# Patient Record
Sex: Female | Born: 1990 | Race: White | Hispanic: No | Marital: Single | Smoking: Never smoker
Health system: Southern US, Community
[De-identification: ages and names within clinical notes are randomized; demographics above are authoritative.]

## PROBLEM LIST (undated history)

## (undated) ENCOUNTER — Inpatient Hospital Stay: Payer: Self-pay

## (undated) DIAGNOSIS — Q768 Other congenital malformations of bony thorax: Secondary | ICD-10-CM

## (undated) DIAGNOSIS — D649 Anemia, unspecified: Secondary | ICD-10-CM

## (undated) HISTORY — PX: CERVICAL SPINE SURGERY: SHX589

## (undated) HISTORY — PX: BACK SURGERY: SHX140

---
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
## 2010-11-11 ENCOUNTER — Emergency Department: Payer: Self-pay | Admitting: *Deleted

## 2012-09-18 ENCOUNTER — Ambulatory Visit: Payer: Self-pay | Admitting: Physical Medicine and Rehabilitation

## 2012-12-19 LAB — COMPREHENSIVE METABOLIC PANEL
Albumin: 4.2 g/dL (ref 3.4–5.0)
Alkaline Phosphatase: 88 U/L (ref 50–136)
Anion Gap: 4 — ABNORMAL LOW (ref 7–16)
BUN: 15 mg/dL (ref 7–18)
Bilirubin,Total: 0.5 mg/dL (ref 0.2–1.0)
Calcium, Total: 9.3 mg/dL (ref 8.5–10.1)
Chloride: 106 mmol/L (ref 98–107)
Co2: 26 mmol/L (ref 21–32)
Creatinine: 0.74 mg/dL (ref 0.60–1.30)
EGFR (African American): >60
EGFR (Non-African Amer.): >60
Glucose: 86 mg/dL (ref 65–99)
Osmolality: 272 (ref 275–301)
Potassium: 4.5 mmol/L (ref 3.5–5.1)
SGOT(AST): 24 U/L (ref 15–37)
SGPT (ALT): 21 U/L (ref 12–78)
Sodium: 136 mmol/L (ref 136–145)
Total Protein: 8.6 g/dL — ABNORMAL HIGH (ref 6.4–8.2)

## 2012-12-19 LAB — CBC
HCT: 41 % (ref 35.0–47.0)
HGB: 14 g/dL (ref 12.0–16.0)
MCH: 30.4 pg (ref 26.0–34.0)
MCHC: 34.1 g/dL (ref 32.0–36.0)
MCV: 89 fL (ref 80–100)
Platelet: 277 10*3/uL (ref 150–440)
RBC: 4.6 10*6/uL (ref 3.80–5.20)
RDW: 13.3 % (ref 11.5–14.5)
WBC: 20.4 10*3/uL — ABNORMAL HIGH (ref 3.6–11.0)

## 2012-12-19 LAB — URINALYSIS, COMPLETE
Bacteria: NONE SEEN
Bilirubin,UR: NEGATIVE
Glucose,UR: NEGATIVE mg/dL (ref 0–75)
Nitrite: NEGATIVE
Ph: 5 (ref 4.5–8.0)
Protein: NEGATIVE
RBC,UR: 5 /HPF (ref 0–5)
Specific Gravity: 1.019 (ref 1.003–1.030)
Squamous Epithelial: 3
WBC UR: 13 /HPF (ref 0–5)

## 2012-12-19 LAB — PREGNANCY, URINE: Pregnancy Test, Urine: NEGATIVE m[IU]/mL

## 2012-12-19 LAB — LIPASE, BLOOD: Lipase: 92 U/L (ref 73–393)

## 2012-12-20 ENCOUNTER — Observation Stay: Payer: Self-pay | Admitting: Surgery

## 2012-12-20 LAB — GC/CHLAMYDIA PROBE AMP

## 2012-12-20 LAB — CBC WITH DIFFERENTIAL/PLATELET
Basophil #: 0.1 10*3/uL (ref 0.0–0.1)
Basophil %: 0.5 %
Eosinophil #: 0 10*3/uL (ref 0.0–0.7)
Eosinophil %: 0.3 %
HCT: 36.8 % (ref 35.0–47.0)
HGB: 12.5 g/dL (ref 12.0–16.0)
Lymphocyte #: 3.1 10*3/uL (ref 1.0–3.6)
Lymphocyte %: 26.2 %
MCH: 30.4 pg (ref 26.0–34.0)
MCHC: 34.1 g/dL (ref 32.0–36.0)
MCV: 89 fL (ref 80–100)
Monocyte #: 1 x10 3/mm — ABNORMAL HIGH (ref 0.2–0.9)
Monocyte %: 8.4 %
Neutrophil #: 7.6 10*3/uL — ABNORMAL HIGH (ref 1.4–6.5)
Neutrophil %: 64.6 %
Platelet: 246 10*3/uL (ref 150–440)
RBC: 4.12 10*6/uL (ref 3.80–5.20)
RDW: 13.3 % (ref 11.5–14.5)
WBC: 11.7 10*3/uL — ABNORMAL HIGH (ref 3.6–11.0)

## 2012-12-20 LAB — WET PREP, GENITAL

## 2012-12-21 LAB — URINE CULTURE

## 2012-12-23 ENCOUNTER — Ambulatory Visit: Payer: Self-pay | Admitting: Internal Medicine

## 2013-01-02 ENCOUNTER — Ambulatory Visit: Payer: Self-pay | Admitting: Gastroenterology

## 2013-01-05 LAB — PATHOLOGY REPORT

## 2013-12-29 ENCOUNTER — Ambulatory Visit: Payer: Self-pay | Admitting: Orthopedic Surgery

## 2014-02-04 DIAGNOSIS — M5000 Cervical disc disorder with myelopathy, unspecified cervical region: Secondary | ICD-10-CM | POA: Insufficient documentation

## 2014-07-23 HISTORY — PX: CERVICAL SPINE SURGERY: SHX589

## 2014-08-13 NOTE — H&P (Signed)
PATIENT NAME:  Brianna Arnold, Damaris M MR#:  [id number] DATE OF BIRTH:  August 09, 1990  DATE OF ADMISSION:  12/20/2012  CHIEF COMPLAINT:  Abdominal pain.   BRIEF HISTORY OF PRESENT ILLNESS:  Brianna Arnold is a 24 year old woman seen in the Emergency Room with approximately a 12 hour history of significant abdominal pain and cramping.  Pain is primarily right lower quadrant.  It came on over a space of couple of hours.  This was accompanied by profound nausea and vomiting.  She estimates she has vomited more than 20 times.  She had a chill, but no documented fever.  She has not had any previous episodes.  Work-up in the Emergency Room revealed normal electrolytes with an elevated white blood cell count of 20,000.  CT scan was performed which did not demonstrate any significant abnormalities.  There was no evidence of acute appendicitis.  There did not appear to be any free fluid in the abdomen or any pelvic changes noted.  She had a normal pelvic exam performed by the Emergency Room physician.   She denies a history of hepatitis, yellow jaundice, pancreatitis, peptic ulcer disease, gallbladder disease or diverticulitis.  She has some sort of "twisting of her intestines" as a baby and was hospitalized without surgery.  She never had any surgical procedures.  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   MEDICATIONS:  She takes Flexeril intermittently for muscle spasms.    ALLERGIES:  Has no medical allergies.   SOCIAL HISTORY:  She does not smoke cigarettes, lives with her parents.   REVIEW OF SYSTEMS:  Is performed and otherwise unremarkable.   PHYSICAL EXAMINATION:  VITAL SIGNS:  Temperature is 98.3, blood pressure 120/72, heart rate 76 and regular, pain scale was now a 3 after a 10 on admission.  HEENT:  No scleral icterus.  No pupillary abnormalities.  No facial deformities.   NECK:  Reveals no tenderness.  Midline trachea.  No adenopathy.  CHEST:  Clear with no adventitious sounds.   She has normal pulmonary excursion.  CARDIAC:  No murmurs or gallops.  She seems to be in normal sinus rhythm.  ABDOMEN:  Soft with minimal right lower quadrant tenderness.  She has active bowel sounds.  There is no rebound or guarding.  LOWER EXTREMITIES:  Reveals good distal pulses.  No deformities.  PSYCHIATRIC:  Normal orientation, normal affect.   ASSESSMENT AND PLAN:  I have independently reviewed her CT scan.  I believe I can see her appendix.  I do not see any evidence of any inflammatory change.  However, with her right lower quadrant pain, elevated white blood cell count, history of significant discomfort we will admit her to the hospital for observation.  We will rehydrate her and put her on pain medication, recheck her white blood cell count.  Her parents are present.  The plans were discussed with the patient and her parents and they are in agreement.     ____________________________ Carmie Endalph L. Ely III, MD rle:ea D: 12/20/2012 01:04:43 ET T: 12/20/2012 02:22:06 ET JOB#: [id number]  cc: Quentin Orealph L. Ely III, MD, <Dictator> Quentin OreALPH L ELY MD ELECTRONICALLY SIGNED 12/20/2012 19:59

## 2014-08-27 DIAGNOSIS — M4030 Flatback syndrome, site unspecified: Secondary | ICD-10-CM | POA: Insufficient documentation

## 2014-09-07 DIAGNOSIS — G43009 Migraine without aura, not intractable, without status migrainosus: Secondary | ICD-10-CM | POA: Insufficient documentation

## 2014-10-22 HISTORY — PX: BACK SURGERY: SHX140

## 2014-11-09 DIAGNOSIS — Q789 Osteochondrodysplasia, unspecified: Secondary | ICD-10-CM | POA: Insufficient documentation

## 2014-11-09 DIAGNOSIS — Z981 Arthrodesis status: Secondary | ICD-10-CM | POA: Insufficient documentation

## 2014-11-09 DIAGNOSIS — Q763 Congenital scoliosis due to congenital bony malformation: Secondary | ICD-10-CM | POA: Insufficient documentation

## 2015-01-26 ENCOUNTER — Other Ambulatory Visit: Payer: Self-pay | Admitting: Internal Medicine

## 2015-01-26 DIAGNOSIS — E041 Nontoxic single thyroid nodule: Secondary | ICD-10-CM

## 2015-01-28 ENCOUNTER — Other Ambulatory Visit: Payer: Self-pay | Admitting: Internal Medicine

## 2015-01-28 DIAGNOSIS — E041 Nontoxic single thyroid nodule: Secondary | ICD-10-CM

## 2015-02-01 ENCOUNTER — Other Ambulatory Visit: Payer: Self-pay | Admitting: Radiology

## 2015-02-02 ENCOUNTER — Ambulatory Visit
Admission: RE | Admit: 2015-02-02 | Discharge: 2015-02-02 | Disposition: A | Discharge status: Home / Self Care | Payer: 59 | Source: Ambulatory Visit | Attending: Internal Medicine | Admitting: Internal Medicine

## 2015-02-02 DIAGNOSIS — E041 Nontoxic single thyroid nodule: Secondary | ICD-10-CM

## 2015-02-02 DIAGNOSIS — R591 Generalized enlarged lymph nodes: Secondary | ICD-10-CM | POA: Insufficient documentation

## 2015-04-19 ENCOUNTER — Emergency Department: Payer: 59

## 2015-04-19 ENCOUNTER — Encounter: Payer: Self-pay | Admitting: Emergency Medicine

## 2015-04-19 ENCOUNTER — Emergency Department
Admission: EM | Admit: 2015-04-19 | Discharge: 2015-04-19 | Disposition: A | Discharge status: Home / Self Care | Payer: 59 | Attending: Emergency Medicine | Admitting: Emergency Medicine

## 2015-04-19 DIAGNOSIS — T481X1A Poisoning by skeletal muscle relaxants [neuromuscular blocking agents], accidental (unintentional), initial encounter: Secondary | ICD-10-CM | POA: Insufficient documentation

## 2015-04-19 DIAGNOSIS — R451 Restlessness and agitation: Secondary | ICD-10-CM | POA: Diagnosis not present

## 2015-04-19 DIAGNOSIS — R410 Disorientation, unspecified: Secondary | ICD-10-CM | POA: Diagnosis not present

## 2015-04-19 DIAGNOSIS — R000 Tachycardia, unspecified: Secondary | ICD-10-CM | POA: Diagnosis not present

## 2015-04-19 DIAGNOSIS — F329 Major depressive disorder, single episode, unspecified: Secondary | ICD-10-CM | POA: Diagnosis not present

## 2015-04-19 DIAGNOSIS — Y998 Other external cause status: Secondary | ICD-10-CM | POA: Diagnosis not present

## 2015-04-19 DIAGNOSIS — Y9389 Activity, other specified: Secondary | ICD-10-CM | POA: Diagnosis not present

## 2015-04-19 DIAGNOSIS — T50905A Adverse effect of unspecified drugs, medicaments and biological substances, initial encounter: Secondary | ICD-10-CM

## 2015-04-19 DIAGNOSIS — R4182 Altered mental status, unspecified: Secondary | ICD-10-CM | POA: Diagnosis present

## 2015-04-19 DIAGNOSIS — F19921 Other psychoactive substance use, unspecified with intoxication with delirium: Secondary | ICD-10-CM

## 2015-04-19 DIAGNOSIS — Y9289 Other specified places as the place of occurrence of the external cause: Secondary | ICD-10-CM | POA: Diagnosis not present

## 2015-04-19 DIAGNOSIS — Z3202 Encounter for pregnancy test, result negative: Secondary | ICD-10-CM | POA: Diagnosis not present

## 2015-04-19 DIAGNOSIS — T50901A Poisoning by unspecified drugs, medicaments and biological substances, accidental (unintentional), initial encounter: Secondary | ICD-10-CM

## 2015-04-19 DIAGNOSIS — F1310 Sedative, hypnotic or anxiolytic abuse, uncomplicated: Secondary | ICD-10-CM | POA: Insufficient documentation

## 2015-04-19 HISTORY — DX: Other congenital malformations of bony thorax: Q76.8

## 2015-04-19 LAB — URINE DRUG SCREEN, QUALITATIVE (ARMC ONLY)
Amphetamines, Ur Screen: NOT DETECTED
Barbiturates, Ur Screen: NOT DETECTED
Benzodiazepine, Ur Scrn: NOT DETECTED
Cannabinoid 50 Ng, Ur ~~LOC~~: NOT DETECTED
Cocaine Metabolite,Ur ~~LOC~~: NOT DETECTED
MDMA (Ecstasy)Ur Screen: NOT DETECTED
Methadone Scn, Ur: NOT DETECTED
Opiate, Ur Screen: NOT DETECTED
Phencyclidine (PCP) Ur S: NOT DETECTED
Tricyclic, Ur Screen: POSITIVE — AB

## 2015-04-19 LAB — CBC WITH DIFFERENTIAL/PLATELET
Basophils Absolute: 0 10*3/uL (ref 0–0.1)
Basophils Relative: 0 %
Eosinophils Absolute: 0 10*3/uL (ref 0–0.7)
Eosinophils Relative: 0 %
HCT: 36.2 % (ref 35.0–47.0)
Hemoglobin: 12.1 g/dL (ref 12.0–16.0)
Lymphocytes Relative: 9 %
Lymphs Abs: 1.4 10*3/uL (ref 1.0–3.6)
MCH: 27.5 pg (ref 26.0–34.0)
MCHC: 33.4 g/dL (ref 32.0–36.0)
MCV: 82.4 fL (ref 80.0–100.0)
Monocytes Absolute: 0.8 10*3/uL (ref 0.2–0.9)
Monocytes Relative: 5 %
Neutro Abs: 13.5 10*3/uL — ABNORMAL HIGH (ref 1.4–6.5)
Neutrophils Relative %: 86 %
Platelets: 293 10*3/uL (ref 150–440)
RBC: 4.39 MIL/uL (ref 3.80–5.20)
RDW: 15.4 % — ABNORMAL HIGH (ref 11.5–14.5)
WBC: 15.8 10*3/uL — ABNORMAL HIGH (ref 3.6–11.0)

## 2015-04-19 LAB — URINALYSIS COMPLETE WITH MICROSCOPIC (ARMC ONLY)
Bilirubin Urine: NEGATIVE
Glucose, UA: NEGATIVE mg/dL
Leukocytes, UA: NEGATIVE
Nitrite: NEGATIVE
Protein, ur: NEGATIVE mg/dL
Specific Gravity, Urine: 1.018 (ref 1.005–1.030)
pH: 5 (ref 5.0–8.0)

## 2015-04-19 LAB — HCG, QUANTITATIVE, PREGNANCY: hCG, Beta Chain, Quant, S: 2 m[IU]/mL (ref <5)

## 2015-04-19 LAB — CK: Total CK: 81 U/L (ref 38–234)

## 2015-04-19 LAB — COMPREHENSIVE METABOLIC PANEL
ALT: 13 U/L — ABNORMAL LOW (ref 14–54)
AST: 20 U/L (ref 15–41)
Albumin: 4.6 g/dL (ref 3.5–5.0)
Alkaline Phosphatase: 86 U/L (ref 38–126)
Anion gap: 8 (ref 5–15)
BUN: 11 mg/dL (ref 6–20)
CO2: 25 mmol/L (ref 22–32)
Calcium: 9.6 mg/dL (ref 8.9–10.3)
Chloride: 106 mmol/L (ref 101–111)
Creatinine, Ser: 0.55 mg/dL (ref 0.44–1.00)
GFR calc Af Amer: >60 mL/min (ref >60)
GFR calc non Af Amer: >60 mL/min (ref >60)
Glucose, Bld: 109 mg/dL — ABNORMAL HIGH (ref 65–99)
Potassium: 3.7 mmol/L (ref 3.5–5.1)
Sodium: 139 mmol/L (ref 135–145)
Total Bilirubin: 0.7 mg/dL (ref 0.3–1.2)
Total Protein: 8.4 g/dL — ABNORMAL HIGH (ref 6.5–8.1)

## 2015-04-19 LAB — BLOOD GAS, VENOUS
Patient temperature: 37
pCO2, Ven: 38 mmHg — ABNORMAL LOW (ref 44.0–60.0)
pH, Ven: 7.4 (ref 7.320–7.430)

## 2015-04-19 LAB — SALICYLATE LEVEL: Salicylate Lvl: <4 mg/dL (ref 2.8–30.0)

## 2015-04-19 LAB — LACTIC ACID, PLASMA: Lactic Acid, Venous: 1.6 mmol/L (ref 0.5–2.0)

## 2015-04-19 LAB — ETHANOL: Alcohol, Ethyl (B): <5 mg/dL (ref <5)

## 2015-04-19 LAB — ACETAMINOPHEN LEVEL: Acetaminophen (Tylenol), Serum: <10 ug/mL — ABNORMAL LOW (ref 10–30)

## 2015-04-19 MED ORDER — LORAZEPAM 2 MG/ML IJ SOLN
1.0000 mg | Freq: Once | INTRAMUSCULAR | Status: AC
  Administered 2015-04-19: 1 mg via INTRAVENOUS

## 2015-04-19 MED ORDER — SODIUM CHLORIDE 0.9 % IV SOLN
1000.0000 mL | Freq: Once | INTRAVENOUS | Status: AC
  Administered 2015-04-19: 1000 mL via INTRAVENOUS

## 2015-04-19 MED ORDER — LORAZEPAM 2 MG/ML IJ SOLN
INTRAMUSCULAR | Status: AC
  Administered 2015-04-19: 1 mg via INTRAVENOUS
  Filled 2015-04-19: qty 1

## 2015-04-19 NOTE — Consult Note (Signed)
[facility] Consult   Reason for Consult:  Follow-up for a 24 year old woman who was brought to the hospital this morning after an apparent overdose on Flexeril Referring Physician:  Jimmye Norman Patient Identification: Brianna Arnold MRN:  [id number] Principal Diagnosis: Medication-induced delirium, acute, mixed level of activity [facility]) Diagnosis:   Patient Active Problem List   Diagnosis Date Noted  . Medication-induced delirium, acute, mixed level of activity ([place]) [F19.921] 04/19/2015  . Overdose [T50.901A] 04/19/2015    Total Time spent with patient: 1 hour  Subjective:   Brianna Arnold is a 24 y.o. female patient admitted with "I just took some Flexeril because of my muscle spasms".  HPI:  Information from the patient and the chart. Also interviewed her mother and father this morning before the patient had woken up. The mother reports that she walked by the patient's room this morning and saw that the patient appeared to be passed out on her bed. Patient was not fully arousable. There was an open bottle of Flexeril nearby although it was apparently not completely empty and they did not know how many she might of taken. Mother brought the patient into the emergency room. Patient was agitatedly delirious and required some sedation and then has slept most of the day. Mother reports that there had not been any signs of depression. Patient had not been saying anything hopeless or suicidal. Had seemed to be in pretty much her normal mood except that she had recently had some extra worries about finances. Patient finally woke up and was able to give a coherent history. She says that she remembers waking up at 2:00 in the morning and taking too Flexeril which is her usual dosage because of her muscle spasms and then going back to bed. She acknowledges that it is possible that she could've taken more than that but she does not remember it. Patient absolutely denies  there was any suicidal intent. Denies that she was trying to overdose. Denies that she took any other medication along with it.  Social history: Patient lives with her mother. Parents are divorced but father remains part of her life. Patient has not been able to work for most of this year because she has been getting surgery on her neck and back because of her chronic congenital condition. Has some worries about money as a result. Doesn't feel ready yet to go back to work and is uncertain whether she would ever be able to do so.  Substance abuse history: Denies any abuse of alcohol or drugs for past history of abuse of alcohol or drugs.  Medical condition: Patient has a congenital medical syndrome which apparently causes serious problems with the spine. Evidently it had gotten to the point of being potentially life threatening earlier this year and she had several surgeries with a specialist in [place] to repair her neck and back. She has been recovering since then. Outside of this no other ongoing medical problems.  Past Psychiatric History: Patient had been briefly treated with an antidepressant by her primary care doctor at one point because of anxiety but was never compliant with it. Patient does not think she's ever had a major depression. No history of hospitalizations no history of suicide attempts  Risk to Self: Is patient at risk for suicide?: No Risk to Others:   Prior Inpatient Therapy:   Prior Outpatient Therapy:    Past Medical History:  Past Medical History  Diagnosis Date  . Jarcho-Levin syndrome  Past Surgical History  Procedure Laterality Date  . Back surgery    . Cervical spine surgery     Family History: No family history on file. Family Psychiatric  History: No family history of mental health problems identified Social History:  History  Alcohol Use: Not on file     History  Drug Use Not on file    Social History   Social History  . Marital Status: Single     Spouse Name: N/A  . Number of Children: N/A  . Years of Education: N/A   Social History Main Topics  . Smoking status: Not on file  . Smokeless tobacco: Not on file  . Alcohol Use: Not on file  . Drug Use: Not on file  . Sexual Activity: Not on file   Other Topics Concern  . Not on file   Social History Narrative  . No narrative on file   Additional Social History:                          Allergies:   Allergies  Allergen Reactions  . Ativan [Lorazepam] Other (See Comments)    Hallucinations    Labs:  Results for orders placed or performed during the hospital encounter of 04/19/15 (from the past 48 hour(s))  CBC with Differential     Status: Abnormal   Collection Time: 04/19/15  8:44 AM  Result Value Ref Range   WBC 15.8 (H) 3.6 - 11.0 K/uL   RBC 4.39 3.80 - 5.20 MIL/uL   Hemoglobin 12.1 12.0 - 16.0 g/dL   HCT 36.2 35.0 - 47.0 %   MCV 82.4 80.0 - 100.0 fL   MCH 27.5 26.0 - 34.0 pg   MCHC 33.4 32.0 - 36.0 g/dL   RDW 15.4 (H) 11.5 - 14.5 %   Platelets 293 150 - 440 K/uL   Neutrophils Relative % 86 %   Neutro Abs 13.5 (H) 1.4 - 6.5 K/uL   Lymphocytes Relative 9 %   Lymphs Abs 1.4 1.0 - 3.6 K/uL   Monocytes Relative 5 %   Monocytes Absolute 0.8 0.2 - 0.9 K/uL   Eosinophils Relative 0 %   Eosinophils Absolute 0.0 0 - 0.7 K/uL   Basophils Relative 0 %   Basophils Absolute 0.0 0 - 0.1 K/uL  Comprehensive metabolic panel     Status: Abnormal   Collection Time: 04/19/15  8:44 AM  Result Value Ref Range   Sodium 139 135 - 145 mmol/L   Potassium 3.7 3.5 - 5.1 mmol/L   Chloride 106 101 - 111 mmol/L   CO2 25 22 - 32 mmol/L   Glucose, Bld 109 (H) 65 - 99 mg/dL   BUN 11 6 - 20 mg/dL   Creatinine, Ser 0.55 0.44 - 1.00 mg/dL   Calcium 9.6 8.9 - 10.3 mg/dL   Total Protein 8.4 (H) 6.5 - 8.1 g/dL   Albumin 4.6 3.5 - 5.0 g/dL   AST 20 15 - 41 U/L   ALT 13 (L) 14 - 54 U/L   Alkaline Phosphatase 86 38 - 126 U/L   Total Bilirubin 0.7 0.3 - 1.2 mg/dL   GFR  calc non Af Amer >60 >60 mL/min   GFR calc Af Amer >60 >60 mL/min    Comment: (NOTE) The eGFR has been calculated using the CKD EPI equation. This calculation has not been validated in all clinical situations. eGFR's persistently <60 mL/min signify possible Chronic Kidney Disease.  Anion gap 8 5 - 15  Ethanol     Status: None   Collection Time: 04/19/15  8:44 AM  Result Value Ref Range   Alcohol, Ethyl (B) <5 <5 mg/dL    Comment:        LOWEST DETECTABLE LIMIT FOR SERUM ALCOHOL IS 5 mg/dL FOR MEDICAL PURPOSES ONLY   Acetaminophen level     Status: Abnormal   Collection Time: 04/19/15  8:44 AM  Result Value Ref Range   Acetaminophen (Tylenol), Serum <10 (L) 10 - 30 ug/mL    Comment:        THERAPEUTIC CONCENTRATIONS VARY SIGNIFICANTLY. A RANGE OF 10-30 ug/mL MAY BE AN EFFECTIVE CONCENTRATION FOR MANY PATIENTS. HOWEVER, SOME ARE BEST TREATED AT CONCENTRATIONS OUTSIDE THIS RANGE. ACETAMINOPHEN CONCENTRATIONS >150 ug/mL AT 4 HOURS AFTER INGESTION AND >50 ug/mL AT 12 HOURS AFTER INGESTION ARE OFTEN ASSOCIATED WITH TOXIC REACTIONS.   Salicylate level     Status: None   Collection Time: 04/19/15  8:44 AM  Result Value Ref Range   Salicylate Lvl <1.6 2.8 - 30.0 mg/dL  hCG, quantitative, pregnancy     Status: None   Collection Time: 04/19/15  8:44 AM  Result Value Ref Range   hCG, Beta Chain, Quant, S 2 <5 mIU/mL    Comment:          GEST. AGE      CONC.  (mIU/mL)   <=1 WEEK        5 - 50     2 WEEKS       50 - 500     3 WEEKS       100 - 10,000     4 WEEKS     1,000 - 30,000     5 WEEKS     3,500 - 115,000   6-8 WEEKS     12,000 - 270,000    12 WEEKS     15,000 - 220,000        FEMALE AND NON-PREGNANT FEMALE:     LESS THAN 5 mIU/mL   Lactic acid, plasma     Status: None   Collection Time: 04/19/15  8:56 AM  Result Value Ref Range   Lactic Acid, Venous 1.6 0.5 - 2.0 mmol/L  CK     Status: None   Collection Time: 04/19/15  8:56 AM  Result Value Ref Range   Total  CK 81 38 - 234 U/L  Urinalysis complete, with microscopic     Status: Abnormal   Collection Time: 04/19/15  8:59 AM  Result Value Ref Range   Color, Urine YELLOW (A) YELLOW   APPearance CLEAR (A) CLEAR   Glucose, UA NEGATIVE NEGATIVE mg/dL   Bilirubin Urine NEGATIVE NEGATIVE   Ketones, ur TRACE (A) NEGATIVE mg/dL   Specific Gravity, Urine 1.018 1.005 - 1.030   Hgb urine dipstick 2+ (A) NEGATIVE   pH 5.0 5.0 - 8.0   Protein, ur NEGATIVE NEGATIVE mg/dL   Nitrite NEGATIVE NEGATIVE   Leukocytes, UA NEGATIVE NEGATIVE   RBC / HPF 6-30 0 - 5 RBC/hpf   WBC, UA 6-30 0 - 5 WBC/hpf   Bacteria, UA RARE (A) NONE SEEN   Squamous Epithelial / LPF 0-5 (A) NONE SEEN   Mucous PRESENT   Urine Drug Screen, Qualitative ([facility] only)     Status: Abnormal   Collection Time: 04/19/15  8:59 AM  Result Value Ref Range   Tricyclic, Ur Screen POSITIVE (A) NONE DETECTED   Amphetamines, Ur  Screen NONE DETECTED NONE DETECTED   MDMA (Ecstasy)Ur Screen NONE DETECTED NONE DETECTED   Cocaine Metabolite,Ur [place] NONE DETECTED NONE DETECTED   Opiate, Ur Screen NONE DETECTED NONE DETECTED   Phencyclidine (PCP) Ur S NONE DETECTED NONE DETECTED   Cannabinoid 50 Ng, Ur [place] NONE DETECTED NONE DETECTED   Barbiturates, Ur Screen NONE DETECTED NONE DETECTED   Benzodiazepine, Ur Scrn NONE DETECTED NONE DETECTED   Methadone Scn, Ur NONE DETECTED NONE DETECTED    Comment: (NOTE) 153  Tricyclics, urine               Cutoff 1000 ng/mL 200  Amphetamines, urine             Cutoff 1000 ng/mL 300  MDMA (Ecstasy), urine           Cutoff 500 ng/mL 400  Cocaine Metabolite, urine       Cutoff 300 ng/mL 500  Opiate, urine                   Cutoff 300 ng/mL 600  Phencyclidine (PCP), urine      Cutoff 25 ng/mL 700  Cannabinoid, urine              Cutoff 50 ng/mL 800  Barbiturates, urine             Cutoff 200 ng/mL 900  Benzodiazepine, urine           Cutoff 200 ng/mL 1000 Methadone, urine                Cutoff 300 ng/mL 1100 1200 The  urine drug screen provides only a preliminary, unconfirmed 1300 analytical test result and should not be used for non-medical 1400 purposes. Clinical consideration and professional judgment should 1500 be applied to any positive drug screen result due to possible 1600 interfering substances. A more specific alternate chemical method 1700 must be used in order to obtain a confirmed analytical result.  1800 Gas chromato graphy / mass spectrometry (GC/MS) is the preferred 1900 confirmatory method.   Blood gas, venous     Status: Abnormal   Collection Time: 04/19/15  9:05 AM  Result Value Ref Range   pH, Ven 7.40 7.320 - 7.430   pCO2, Ven 38 (L) 44.0 - 60.0 mmHg   Patient temperature 37.0    Collection site VEIN    Sample type VENOUS     No current facility-administered medications for this encounter.   Current Outpatient Prescriptions  Medication Sig Dispense Refill  . cyclobenzaprine (FLEXERIL) 10 MG tablet Take 1 tablet by mouth 3 (three) times daily as needed.      Musculoskeletal: Strength & Muscle Tone: decreased Gait & Station: unable to stand Patient leans: N/A  Psychiatric Specialty Exam: Review of Systems  Constitutional: Negative.   Eyes: Negative.   Respiratory: Negative.   Cardiovascular: Negative.   Gastrointestinal: Negative.   Musculoskeletal: Positive for back pain and neck pain.  Skin: Negative.   Neurological: Negative.   Psychiatric/Behavioral: Negative for depression, suicidal ideas, hallucinations, memory loss and substance abuse. The patient is not nervous/anxious and does not have insomnia.     Blood pressure 109/70, pulse 94, temperature 98.1 F (36.7 C), temperature source Oral, resp. rate 19, height '5\' 2"'  (1.575 m), weight 74.844 kg (165 lb), SpO2 98 %.Body mass index is 30.17 kg/(m^2).  General Appearance: Casual  Eye Contact::  Fair  Speech:  Slow  Volume:  Decreased  Mood:  Euthymic  Affect:  Constricted  Thought Process:  Goal Directed   Orientation:  Full (Time, Place, and Person)  Thought Content:  Negative  Suicidal Thoughts:  No  Homicidal Thoughts:  No  Memory:  Immediate;   Good Recent;   Fair Remote;   Fair  Judgement:  Fair  Insight:  Fair  Psychomotor Activity:  Decreased  Concentration:  Fair  Recall:  [employer] of Knowledge:Fair  Language: Fair  Akathisia:  No  Handed:  Right  AIMS (if indicated):     Assets:  Communication Skills Desire for Improvement Housing Intimacy Resilience Social Support  ADL's:  Intact  Cognition: WNL  Sleep:      Treatment Plan Summary: Plan This is a 24 year old woman brought into the hospital with a medication-induced delirium which has now resolved. Medication overdose appears to of largely cleared. There was concern about possible intentional overdose. Everything that I've heard from the patient and her family suggest that there was no reason to think there was a depression and no evidence of intentional overdose. Patient absolutely denies any suicidal ideation. Family reports that the patient has been talking about positive things to live for and things she is looking forward to in the future. At this point we don't have any evidence to up hold a involuntary commitment. Patient was educated about the risks of potential overdose of sedating medicine to take only the risk of accidental overdose. We reviewed some method she may want to employ to prevent the risk of extra doses in the future. Strongly encouraged to not take more than the 2 doses of Flexeril per day if possible. She is to follow-up with her regular primary care doctor. No indication for follow-up with psychiatric care. Problems appear to be resolved and she can be discharged. IVC discontinued. Patient and family agreeable to plan.  Disposition: No evidence of imminent risk to self or others at present.   Patient does not meet criteria for psychiatric inpatient admission.  Gursimran Litaker 04/19/2015 6:59 PM

## 2015-04-19 NOTE — ED Provider Notes (Signed)
-----------------------------------------   6:25 PM on 04/19/2015 -----------------------------------------   BP 109/70 mmHg  Pulse 94  Temp(Src) 98.1 F (36.7 C) (Oral)  Resp 19  Ht 5\' 2"  (1.575 m)  Wt 74.844 kg  BMI 30.17 kg/m2  SpO2 98%  I spoke in person with Dr. Toni Amendlapacs who evaluated the patient and feels that the patient is safe to be discharged with outpatient follow-up.  After speaking with the patient and her family, he believes that this was an accidental overdose and had an extensive discussion with the patient and her family about how to avoid similar issues in the chair.  The patient is hemodynamically stable and appropriate to go at this time, ambulatory without difficulty and in the company of her mother.   Loleta Roseory Naamah Boggess, MD 04/19/15 [phone]

## 2015-04-19 NOTE — Discharge Instructions (Signed)
You were seen today for what appears to be an accidental overdose of your Flexeril.  Please be very careful and cautious when you take your medication so that you do not accidentally take more than the prescribed dose.  Follow-up with your regular doctor at the next available opportunity.   Accidental Overdose A drug overdose occurs when a chemical substance (drug or medication) is used in amounts large enough to overcome a person. This may result in severe illness or death. This is a type of poisoning. Accidental overdoses of medications or other substances come from a variety of reasons. When this happens accidentally, it is often because the person taking the substance does not know enough about what they have taken. Drugs which commonly cause overdose deaths are alcohol, psychotropic medications (medications which affect the mind), pain medications, illegal drugs (street drugs) such as cocaine and heroin, and multiple drugs taken at the same time. It may result from careless behavior (such as over-indulging at a party). Other causes of overdose may include multiple drug use, a lapse in memory, or drug use after a period of no drug use.  Sometimes overdosing occurs because a person cannot remember if they have taken their medication.  A common unintentional overdose in young children involves multi-vitamins containing iron. Iron is a part of the hemoglobin molecule in blood. It is used to transport oxygen to living cells. When taken in small amounts, iron allows the body to restock hemoglobin. In large amounts, it causes problems in the body. If this overdose is not treated, it can lead to death. Never take medicines that show signs of tampering or do not seem quite right. Never take medicines in the dark or in poor lighting. Read the label and check each dose of medicine before you take it. When adults are poisoned, it happens most often through carelessness or lack of information. Taking medicines in the  dark or taking medicine prescribed for someone else to treat the same type of problem is a dangerous practice. SYMPTOMS  Symptoms of overdose depend on the medication and amount taken. They can vary from over-activity with stimulant over-dosage, to sleepiness from depressants such as alcohol, narcotics and tranquilizers. Confusion, dizziness, nausea and vomiting may be present. If problems are severe enough coma and death may result. DIAGNOSIS  Diagnosis and management are generally straightforward if the drug is known. Otherwise it is more difficult. At times, certain symptoms and signs exhibited by the patient, or blood tests, can reveal the drug in question.  TREATMENT  In an emergency department, most patients can be treated with supportive measures. Antidotes may be available if there has been an overdose of opioids or benzodiazepines. A rapid improvement will often occur if this is the cause of overdose. At home or away from medical care:  There may be no immediate problems or warning signs in children.  Not everything works well in all cases of poisoning.  Take immediate action. Poisons may act quickly.  If you think someone has swallowed medicine or a household product, and the person is unconscious, having seizures (convulsions), or is not breathing, immediately call for an ambulance. IF a person is conscious and appears to be doing OK but has swallowed a poison:  Do not wait to see what effect the poison will have. Immediately call a poison control center (listed in the white pages of your telephone book under "Poison Control" or inside the front cover with other emergency numbers). Some poison control centers have TTY  capability for the deaf. Check with your local center if you or someone in your family requires this service.  Keep the container so you can read the label on the product for ingredients.  Describe what, when, and how much was taken and the age and condition of the  person poisoned. Inform them if the person is vomiting, choking, drowsy, shows a change in color or temperature of skin, is conscious or unconscious, or is convulsing.  Do not cause vomiting unless instructed by medical personnel. Do not induce vomiting or force liquids into a person who is convulsing, unconscious, or very drowsy. Stay calm and in control.   Activated charcoal also is sometimes used in certain types of poisoning and you may wish to add a supply to your emergency medicines. It is available without a prescription. Call a poison control center before using this medication. PREVENTION  Thousands of children die every year from unintentional poisoning. This may be from household chemicals, poisoning from carbon monoxide in a car, taking their parent's medications, or simply taking a few iron pills or vitamins with iron. Poisoning comes from unexpected sources.  Store medicines out of the sight and reach of children, preferably in a locked cabinet. Do not keep medications in a food cabinet. Always store your medicines in a secure place. Get rid of expired medications.  If you have children living with you or have them as occasional guests, you should have child-resistant caps on your medicine containers. Keep everything out of reach. Child proof your home.  If you are called to the telephone or to answer the door while you are taking a medicine, take the container with you or put the medicine out of the reach of small children.  Do not take your medication in front of children. Do not tell your child how good a medication is and how good it is for them. They may get the idea it is more of a treat.  If you are an adult and have accidentally taken an overdose, you need to consider how this happened and what can be done to prevent it from happening again. If this was from a street drug or alcohol, determine if there is a problem that needs addressing. If you are not sure a problems exists,  it is easy to talk to a professional and ask them if they think you have a problem. It is better to handle this problem in this way before it happens again and has a much worse consequence.   This information is not intended to replace advice given to you by your health care provider. Make sure you discuss any questions you have with your health care provider.   Document Released: 06/23/2004 Document Revised: 04/30/2014 Document Reviewed: 09/27/2014 Elsevier Interactive Patient Education [employer]

## 2015-04-19 NOTE — ED Notes (Signed)
Patient ambulatory with assistance to bedside commode.  Patient alert and oriented to self and place but not to time.

## 2015-04-19 NOTE — ED Notes (Signed)
Flexeril bottle from pharmacy returned to mother of pt.

## 2015-04-19 NOTE — ED Notes (Signed)
Per pt mother, she found the pt in bed, states she had water in the tub, not acting herself, found bottle of flexeril in bed,  Pt has slurred speech, figiting , rocking back in forth, speech not making any sense.Marland Kitchen. Pt is agitated.. Mother brought the bottle of flexeril with her,..Marland Kitchen

## 2015-04-19 NOTE — ED Notes (Signed)
Iv d'ced from left a/c.  Pt alert.  Refusing wheelchair.  Family assisted pt walking out of er.

## 2015-04-19 NOTE — ED Notes (Signed)
Patient now resting quietly.  Respirations even and nonlabored.  Parents are at bedside at this time.

## 2015-04-19 NOTE — ED Notes (Signed)
Dr clapacs and TTs in with pt and family. ivc rescinded.  D/c inst to mother.

## 2015-04-19 NOTE — ED Notes (Signed)
Resumed care from ArvinMeritoranna rn.  Pt sleeping but arousable.  Family with pt.  Sinus on monitor.  Skin warm and dry.  Iv in place.

## 2015-04-19 NOTE — ED Provider Notes (Addendum)
[facility] Emergency Department Provider Note     Time seen: ----------------------------------------- 8:46 AM on 04/19/2015 -----------------------------------------  L5 caveat: Review of systems and history is limited by altered mental status.  I have reviewed the triage vital signs and the nursing notes.   HISTORY  Chief Complaint Altered Mental Status    HPI Brianna Arnold is a 24 y.o. female who presents to ER for altered mental status. Mom found her in bed without any clothes on not acting normally. She had placed water in the bathtub but was not in the bathtub, a bottle of Flexeril, Ativan. She has slurred speech is brought in fighting and agitated. Patient has never had any episode like this before. Mom states she thought she was depressed over the last week.   No past medical history on file.  There are no active problems to display for this patient.   No past surgical history on file.  Allergies Ativan  Social History Social History  Substance Use Topics  . Smoking status: Not on file  . Smokeless tobacco: Not on file  . Alcohol Use: Not on file    Review of Systems Unknown patient with foot pain, agitated with altered mental status. ____________________________________________   PHYSICAL EXAM:  VITAL SIGNS: ED Triage Vitals  Enc Vitals Group     BP 04/19/15 0828 153/80 mmHg     Pulse Rate 04/19/15 0828 120     Resp --      Temp 04/19/15 0828 98.1 F (36.7 C)     Temp Source 04/19/15 0828 Oral     SpO2 04/19/15 0828 99 %     Weight 04/19/15 0828 165 lb (74.844 kg)     Height 04/19/15 0828 5\' 2"  (1.575 m)     Head Cir --      Peak Flow --      Pain Score --      Pain Loc --      Pain Edu? --      Excl. in [place]? --     Constitutional: Patient is agitated, is not alert and oriented. Moderate distress. Eyes: Conjunctivae are normal. PERRL. Normal extraocular movements. ENT   Head: Normocephalic and  atraumatic.   Nose: No congestion/rhinnorhea.   Mouth/Throat: Mucous membranes are dry   Neck: No stridor. Cardiovascular: Rapid rate, regular rhythm. Normal and symmetric distal pulses are present in all extremities. No murmurs, rubs, or gallops. Respiratory: Normal respiratory effort without tachypnea nor retractions. Breath sounds are clear and equal bilaterally. No wheezes/rales/rhonchi. Gastrointestinal: Soft and nontender. No distention. No abdominal bruits.  Musculoskeletal: Nontender with normal range of motion in all extremities. No joint effusions.  No lower extremity tenderness nor edema. Good peripheral pulses. Neurologic:  Patient is agitated and confused, appears to be moving her extremities well. Skin:  Skin is warm, dry and intact. No rash noted. Psychiatric: Unable to assess, patient agitated at this time. ____________________________________________  EKG: Interpreted by me. Sinus tachycardia with a rate of 151 bpm, normal PR interval, normal QS with, normal QT interval. Normal axis.  ____________________________________________  ED COURSE:  Pertinent labs & imaging results that were available during my care of the patient were reviewed by me and considered in my medical decision making (see chart for details). Patient is agitated and confused, likely overdose. ____________________________________________    LABS (pertinent positives/negatives)  Labs Reviewed  CBC WITH DIFFERENTIAL/PLATELET - Abnormal; Notable for the following:    WBC 15.8 (*)    RDW 15.4 (*)  Neutro Abs 13.5 (*)    All other components within normal limits  COMPREHENSIVE METABOLIC PANEL - Abnormal; Notable for the following:    Glucose, Bld 109 (*)    Total Protein 8.4 (*)    ALT 13 (*)    All other components within normal limits  ACETAMINOPHEN LEVEL - Abnormal; Notable for the following:    Acetaminophen (Tylenol), Serum <10 (*)    All other components within normal limits   BLOOD GAS, VENOUS - Abnormal; Notable for the following:    pCO2, Ven 38 (*)    All other components within normal limits  LACTIC ACID, PLASMA  ETHANOL  SALICYLATE LEVEL  HCG, QUANTITATIVE, PREGNANCY  CK  URINALYSIS COMPLETEWITH MICROSCOPIC ([facility] ONLY)  LACTIC ACID, PLASMA  URINE DRUG SCREEN, QUALITATIVE ([facility] ONLY)  CBG MONITORING, ED    RADIOLOGY Images were viewed by me  Chest x-ray  IMPRESSION: Very low inspiratory volumes with perhaps mild bibasilar atelectasis. Otherwise, no acute cardiopulmonary process. ____________________________________________  FINAL ASSESSMENT AND PLAN  Altered mental status, suspected overdose  Plan: Patient with labs and imaging as dictated above. Patient likely with overdose of Flexeril. Patient showing anticholinergic toxicity. Patient has received fluids and benzodiazepine for agitation and likely dehydration. I discussed with poison control, she likely took a minimum of 10 Flexeril tablets to induce this type of toxicity. Suspected overdose, she will be involuntarily committed at a later point.   Emily Filbert, MD   Emily Filbert, MD 04/19/15 [phone]  Emily Filbert, MD 04/19/15 1007 Patient is still lethargic but awakes and talks to me normally. Her labs are unremarkable. She is pending psychiatric evaluation. Emily Filbert, MD 04/19/15 1030  Emily Filbert, MD 04/19/15 [phone]

## 2016-06-25 ENCOUNTER — Other Ambulatory Visit: Payer: Self-pay | Admitting: Thoracic Surgery

## 2016-06-25 ENCOUNTER — Ambulatory Visit
Admission: RE | Admit: 2016-06-25 | Discharge: 2016-06-25 | Disposition: A | Discharge status: Home / Self Care | Payer: Disability Insurance | Source: Ambulatory Visit | Attending: Thoracic Surgery | Admitting: Thoracic Surgery

## 2016-06-25 DIAGNOSIS — Q798 Other congenital malformations of musculoskeletal system: Secondary | ICD-10-CM

## 2016-06-25 DIAGNOSIS — Q766 Other congenital malformations of ribs: Secondary | ICD-10-CM | POA: Insufficient documentation

## 2016-06-25 DIAGNOSIS — Q7649 Other congenital malformations of spine, not associated with scoliosis: Secondary | ICD-10-CM | POA: Insufficient documentation

## 2018-09-23 ENCOUNTER — Other Ambulatory Visit: Payer: Self-pay | Admitting: Neurosurgery

## 2018-09-23 DIAGNOSIS — Q798 Other congenital malformations of musculoskeletal system: Secondary | ICD-10-CM

## 2018-09-23 DIAGNOSIS — M542 Cervicalgia: Secondary | ICD-10-CM

## 2018-09-24 ENCOUNTER — Other Ambulatory Visit: Payer: Self-pay | Admitting: Neurosurgery

## 2018-09-26 ENCOUNTER — Ambulatory Visit
Admission: RE | Admit: 2018-09-26 | Discharge: 2018-09-26 | Disposition: A | Discharge status: Home / Self Care | Payer: Medicare HMO | Source: Ambulatory Visit | Attending: Neurosurgery | Admitting: Neurosurgery

## 2018-09-26 ENCOUNTER — Ambulatory Visit
Admission: RE | Admit: 2018-09-26 | Discharge: 2018-09-26 | Disposition: A | Discharge status: Home / Self Care | Payer: Medicare HMO | Attending: Neurosurgery | Admitting: Neurosurgery

## 2018-09-26 DIAGNOSIS — Q798 Other congenital malformations of musculoskeletal system: Secondary | ICD-10-CM | POA: Insufficient documentation

## 2018-09-26 DIAGNOSIS — M542 Cervicalgia: Secondary | ICD-10-CM

## 2018-10-10 ENCOUNTER — Ambulatory Visit: Payer: Disability Insurance

## 2018-11-24 ENCOUNTER — Other Ambulatory Visit: Payer: Self-pay | Admitting: Neurosurgery

## 2018-11-24 DIAGNOSIS — M545 Low back pain, unspecified: Secondary | ICD-10-CM

## 2018-11-24 DIAGNOSIS — G8929 Other chronic pain: Secondary | ICD-10-CM

## 2018-11-24 DIAGNOSIS — Q798 Other congenital malformations of musculoskeletal system: Secondary | ICD-10-CM

## 2018-12-01 ENCOUNTER — Other Ambulatory Visit: Payer: Disability Insurance

## 2018-12-01 ENCOUNTER — Ambulatory Visit
Admission: RE | Admit: 2018-12-01 | Discharge: 2018-12-01 | Disposition: A | Discharge status: Home / Self Care | Payer: Medicare HMO | Source: Ambulatory Visit | Attending: Neurosurgery | Admitting: Neurosurgery

## 2018-12-01 ENCOUNTER — Other Ambulatory Visit: Payer: Self-pay

## 2018-12-01 DIAGNOSIS — M545 Low back pain, unspecified: Secondary | ICD-10-CM

## 2018-12-01 DIAGNOSIS — G8929 Other chronic pain: Secondary | ICD-10-CM | POA: Insufficient documentation

## 2018-12-01 DIAGNOSIS — Q798 Other congenital malformations of musculoskeletal system: Secondary | ICD-10-CM | POA: Diagnosis present

## 2018-12-05 ENCOUNTER — Other Ambulatory Visit: Payer: Self-pay

## 2018-12-05 ENCOUNTER — Emergency Department
Admission: EM | Admit: 2018-12-05 | Discharge: 2018-12-05 | Disposition: A | Discharge status: Home / Self Care | Payer: Medicare HMO | Attending: Student | Admitting: Student

## 2018-12-05 ENCOUNTER — Emergency Department: Payer: Medicare HMO

## 2018-12-05 ENCOUNTER — Encounter: Payer: Self-pay | Admitting: Emergency Medicine

## 2018-12-05 DIAGNOSIS — R51 Headache: Secondary | ICD-10-CM | POA: Diagnosis not present

## 2018-12-05 DIAGNOSIS — M542 Cervicalgia: Secondary | ICD-10-CM | POA: Diagnosis present

## 2018-12-05 MED ORDER — CYCLOBENZAPRINE HCL 10 MG PO TABS
10.0000 mg | ORAL_TABLET | Freq: Once | ORAL | Status: AC
  Administered 2018-12-05: 22:00:00 10 mg via ORAL
  Filled 2018-12-05: qty 1

## 2018-12-05 MED ORDER — ACETAMINOPHEN 500 MG PO TABS
1000.0000 mg | ORAL_TABLET | Freq: Once | ORAL | Status: AC
  Administered 2018-12-05: 22:00:00 1000 mg via ORAL
  Filled 2018-12-05: qty 2

## 2018-12-05 MED ORDER — KETOROLAC TROMETHAMINE 30 MG/ML IJ SOLN
30.0000 mg | Freq: Once | INTRAMUSCULAR | Status: AC
  Administered 2018-12-05: 30 mg via INTRAMUSCULAR
  Filled 2018-12-05: qty 1

## 2018-12-05 NOTE — ED Notes (Signed)
Patient reports relaxing earlier today and head tipped back.  States she was told after her surgery this should not happen.  Denies any type of injury.  Patient reports about 10 minutes prior to getting in exam room noticed a burning/stinging sensation across old incision site.

## 2018-12-05 NOTE — Discharge Instructions (Addendum)
Thank you for letting us take care of you in the emergency department today.   Please continue to take your regular, prescribed medications.   Please follow up with: - Dr. Izora Ribas to review your ER visit and follow up on your symptoms.   Please return to the ER for any new or worsening symptoms.

## 2018-12-05 NOTE — ED Notes (Signed)
Spoke with Dr. Archie Balboa about pt, per Dr. Archie Balboa, pt needs C spine CT without contrast.

## 2018-12-05 NOTE — ED Provider Notes (Signed)
[facility] Emergency Department Provider Note  ____________________________________________   First MD Initiated Contact with Patient 12/05/18 2013     (approximate)  I have reviewed the triage vital signs and the nursing notes.  History  Chief Complaint No chief complaint on file.    HPI Brianna Arnold is a 28 y.o. female with hx of Jarcho-Levin syndrome, s/p cervical fusion 4 years ago, who presents to the emergency department for abnormal sensation in her neck.  Patient states earlier this evening, she felt a popping-like sensation in the back of her neck.  Since then, she has been able to mildly extend at the neck.  She states since her fusion surgery 4 years ago, she has never had the ability for any kind of flexion, extension, or rotation.  This new neck extension is abnormal for her.  She also reports an occipital/right lateral neck headache, that she describes as a freezer burn-like sensation.  She denies any weakness, numbness, tingling.  She was otherwise feeling well earlier today.       Past Medical Hx Past Medical History:  Diagnosis Date  . Jarcho-Levin syndrome     Problem List Patient Active Problem List   Diagnosis Date Noted  . Medication-induced delirium, acute, mixed level of activity ([place]) 04/19/2015  . Overdose 04/19/2015    Past Surgical Hx Past Surgical History:  Procedure Laterality Date  . BACK SURGERY    . CERVICAL SPINE SURGERY      Medications Prior to Admission medications   Medication Sig Start Date End Date Taking? Authorizing Provider  cyclobenzaprine (FLEXERIL) 10 MG tablet Take 1 tablet by mouth 3 (three) times daily as needed. 03/01/15   [provider]    Allergies Ativan [lorazepam]  Family Hx No family history on file.  Social Hx Social History   Tobacco Use  . Smoking status: Never Smoker  . Smokeless tobacco: Never Used  Substance Use Topics  . Alcohol use: Not Currently   . Drug use: Not Currently     Review of Systems  Constitutional: Negative for fever. Negative for chills. Eyes: Negative for visual changes. ENT: Negative for sore throat. Cardiovascular: Negative for chest pain. Respiratory: Negative for shortness of breath. Gastrointestinal: Negative for abdominal pain. Negative for nausea. Negative for vomiting. Genitourinary: Negative for dysuria. Musculoskeletal: Negative for leg swelling. Skin: Negative for rash. Neurological: Negative for for headaches.   Physical Exam  Vital Signs: ED Triage Vitals  Enc Vitals Group     BP 12/05/18 1814 (!) 166/87     Pulse Rate 12/05/18 1814 97     Resp 12/05/18 1814 16     Temp 12/05/18 1814 98.5 F (36.9 C)     Temp Source 12/05/18 1814 Oral     SpO2 12/05/18 1814 100 %     Weight 12/05/18 1815 150 lb (68 kg)     Height 12/05/18 1815 4\' 7"  (1.397 m)     Head Circumference --      Peak Flow --      Pain Score 12/05/18 1815 4     Pain Loc --      Pain Edu? --      Excl. in [place]? --     Constitutional: Alert and oriented.  Eyes: Conjunctivae clear. Sclera anicteric. Head: Normocephalic. Atraumatic. Nose: No congestion. No rhinorrhea. Mouth/Throat: Mucous membranes are moist.  Neck: Prior incision site well healed. Able to extend neck about 30 degrees. Subtle popping like sensation palpable over the  posterior neck with extension.  No flexion or rotation which is her baseline. Mild R sided paraspinal tenderness. No midline tenderness. No swelling, erythema, fluctuance.  Cardiovascular: Normal rate, regular rhythm. No murmurs. Extremities well perfused. Respiratory: Normal respiratory effort.  Lungs CTAB. Gastrointestinal: Soft and non-tender. No distention.  Musculoskeletal: No lower extremity edema. Neurologic:  Normal speech and language. No gross focal neurologic deficits are appreciated. UE strength equal and symmetric.  Skin: Skin is warm, dry and intact. No rash noted. Psychiatric: Mood  and affect are appropriate for situation.  EKG  N/A   Radiology  CT CS: IMPRESSION:  1. No acute cervical spine fracture.  2. 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 No hardware complication.    Procedures  Procedure(s) performed (including critical care):  Procedures   Initial Impression / Assessment and Plan / ED Course  28 y.o. female with hx of Jarcho-Levin syndrome, s/p cervical fusion 4 years ago, who presents to the emergency department for abnormal sensation in her neck, and new, abnormal ability to extend the neck. History and exam as above.   Ddx includes hardware complication, fracture, muscle spasm/strain.  Imaging does not demonstrate any acute fractures or issues with her hardware.  Had images power shared, and reviewed with patient's NSGY[facility] who does not see any acute changes compared to her CT scan from 4 days prior, no issues with the hardware.  He will follow her as an outpatient.  We will plan for discharge, advised symptomatic care, patient and mother agreeable with plan and discharge.   Final Clinical Impression(s) / ED Diagnosis  Final diagnoses:  Neck pain     Note:  This document was prepared using Dragon voice recognition software and may include unintentional dictation errors.   Miguel AschoffMonks, Torin Modica L., MD 12/05/18 2259

## 2018-12-05 NOTE — ED Triage Notes (Signed)
Pt to ED via POV, pt states that she had surgery in 2016 for cervical and lumbar fusion. Pt states that she is not supposed to be able to move her neck up and down or side to side. Pt states that today she was relaxing and her head fell back, Pt states that she is now able to move her neck up and down, pt states that she gets "weird feeling" when she moves her neck. Pt states that she had a CT done on Monday and saw Dr. Izora Ribas yesterday to review the images and everything checked out fine. Pt is in NAD at this time.

## 2019-01-29 ENCOUNTER — Other Ambulatory Visit: Payer: Self-pay | Admitting: Neurosurgery

## 2019-02-04 ENCOUNTER — Other Ambulatory Visit: Payer: Self-pay

## 2019-02-04 ENCOUNTER — Encounter
Admission: RE | Admit: 2019-02-04 | Discharge: 2019-02-04 | Disposition: A | Discharge status: Home / Self Care | Payer: Medicare HMO | Source: Ambulatory Visit | Attending: Neurosurgery | Admitting: Neurosurgery

## 2019-02-04 DIAGNOSIS — Z79899 Other long term (current) drug therapy: Secondary | ICD-10-CM | POA: Insufficient documentation

## 2019-02-04 DIAGNOSIS — Z01812 Encounter for preprocedural laboratory examination: Secondary | ICD-10-CM | POA: Diagnosis not present

## 2019-02-04 HISTORY — DX: Anemia, unspecified: D64.9

## 2019-02-04 LAB — URINALYSIS, ROUTINE W REFLEX MICROSCOPIC
Bilirubin Urine: NEGATIVE
Glucose, UA: NEGATIVE mg/dL
Hgb urine dipstick: NEGATIVE
Ketones, ur: NEGATIVE mg/dL
Leukocytes,Ua: NEGATIVE
Nitrite: NEGATIVE
Protein, ur: NEGATIVE mg/dL
Specific Gravity, Urine: 1.015 (ref 1.005–1.030)
pH: 5 (ref 5.0–8.0)

## 2019-02-04 LAB — BASIC METABOLIC PANEL
Anion gap: 8 (ref 5–15)
BUN: 7 mg/dL (ref 6–20)
CO2: 26 mmol/L (ref 22–32)
Calcium: 9.4 mg/dL (ref 8.9–10.3)
Chloride: 104 mmol/L (ref 98–111)
Creatinine, Ser: 0.58 mg/dL (ref 0.44–1.00)
GFR calc Af Amer: >60 mL/min (ref >60)
GFR calc non Af Amer: >60 mL/min (ref >60)
Glucose, Bld: 88 mg/dL (ref 70–99)
Potassium: 4.1 mmol/L (ref 3.5–5.1)
Sodium: 138 mmol/L (ref 135–145)

## 2019-02-04 LAB — CBC
HCT: 38.9 % (ref 36.0–46.0)
Hemoglobin: 12.7 g/dL (ref 12.0–15.0)
MCH: 29.2 pg (ref 26.0–34.0)
MCHC: 32.6 g/dL (ref 30.0–36.0)
MCV: 89.4 fL (ref 80.0–100.0)
Platelets: 307 10*3/uL (ref 150–400)
RBC: 4.35 MIL/uL (ref 3.87–5.11)
RDW: 13.1 % (ref 11.5–15.5)
WBC: 6.8 10*3/uL (ref 4.0–10.5)
nRBC: 0 % (ref 0.0–0.2)

## 2019-02-04 LAB — PROTIME-INR
INR: 1.1 (ref 0.8–1.2)
Prothrombin Time: 13.9 seconds (ref 11.4–15.2)

## 2019-02-04 LAB — TYPE AND SCREEN
ABO/RH(D): O POS
Antibody Screen: NEGATIVE

## 2019-02-04 LAB — SURGICAL PCR SCREEN
MRSA, PCR: NEGATIVE
Staphylococcus aureus: POSITIVE — AB

## 2019-02-04 LAB — APTT: aPTT: 33 seconds (ref 24–36)

## 2019-02-04 NOTE — Patient Instructions (Signed)
Your procedure is scheduled on: February 11, 2019 [facility] Report to Day Surgery on the 2nd floor of the [facility]. To find out your arrival time, please call [phone] between 1PM - 3PM on: February 10, 2019  REMEMBER: Instructions that are not followed completely may result in serious medical risk, up to and including death; or upon the discretion of your surgeon and anesthesiologist your surgery may need to be rescheduled.  Do not eat food after midnight the night before surgery.  No gum chewing, lozengers or hard candies.  You may however, drink CLEAR liquids up to 2 hours before you are scheduled to arrive for your surgery. Do not drink anything within 2 hours of the start of your surgery.  Clear liquids include: - water  - apple juice without pulp -CLEAR gatorade - black coffee or tea (Do NOT add milk or creamers to the coffee or tea) Do NOT drink anything that is not on this list.  No Alcohol for 24 hours before or after surgery.  No Smoking including e-cigarettes for 24 hours prior to surgery.  No chewable tobacco products for at least 6 hours prior to surgery.  No nicotine patches on the day of surgery.  On the morning of surgery brush your teeth with toothpaste and water, you may rinse your mouth with mouthwash if you wish. Do not swallow any toothpaste or mouthwash.  Notify your doctor if there is any change in your medical condition (cold, fever, infection).  Do not wear jewelry, make-up, hairpins, clips or nail polish.  Do not wear lotions, powders, or perfumes.   Do not shave 48 hours prior to surgery.   Contacts and dentures may not be worn into surgery.  Do not bring valuables to the hospital, including drivers license, insurance or credit cards.  [facility] is not responsible for any belongings or valuables.   TAKE THESE MEDICATIONS THE MORNING OF SURGERY:  SPRINTEC  Use CHG Soap  as directed on instruction sheet.  Follow recommendations from  Cardiologist, Pulmonologist or PCP regarding stopping Aspirin  Stop Anti-inflammatories (NSAIDS) such as Advil, Aleve, Ibuprofen, Motrin, Naproxen, Naprosyn and Aspirin based products such as Excedrin, Goodys Powder, BC Powder. (May take Tylenol or Acetaminophen if needed.)  Stop ANY OVER THE COUNTER supplements until after surgery. (May continue Vitamin D, Vitamin B, and multivitamin AND FERROUS SULFATE.)  Wear comfortable clothing (specific to your surgery type) to the hospital.  Plan for stool softeners for home use.  If you are being admitted to the hospital overnight, leave your suitcase in the car. After surgery it may be brought to your room.  If you are being discharged the day of surgery, you will not be allowed to drive home. You will need a responsible adult to drive you home and stay with you that night.   If you are taking public transportation, you will need to have a responsible adult with you. Please confirm with your physician that it is acceptable to use public transportation.   Please call [phone] if you have any questions about these instructions.

## 2019-02-05 ENCOUNTER — Other Ambulatory Visit: Payer: Medicare HMO

## 2019-02-06 ENCOUNTER — Other Ambulatory Visit
Admission: RE | Admit: 2019-02-06 | Discharge: 2019-02-06 | Disposition: A | Discharge status: Home / Self Care | Payer: Medicare HMO | Source: Ambulatory Visit | Attending: Neurosurgery | Admitting: Neurosurgery

## 2019-02-06 ENCOUNTER — Other Ambulatory Visit: Payer: Self-pay

## 2019-02-06 DIAGNOSIS — Z20828 Contact with and (suspected) exposure to other viral communicable diseases: Secondary | ICD-10-CM | POA: Diagnosis not present

## 2019-02-06 DIAGNOSIS — Z01812 Encounter for preprocedural laboratory examination: Secondary | ICD-10-CM | POA: Insufficient documentation

## 2019-02-06 LAB — SARS CORONAVIRUS 2 (TAT 6-24 HRS): SARS Coronavirus 2: NEGATIVE

## 2019-02-11 ENCOUNTER — Ambulatory Visit: Payer: Medicare HMO | Admitting: Anesthesiology

## 2019-02-11 ENCOUNTER — Ambulatory Visit: Payer: Medicare HMO

## 2019-02-11 ENCOUNTER — Encounter
Admission: RE | Disposition: A | Discharge status: Home / Self Care | Payer: Self-pay | Source: Home / Self Care | Attending: Neurosurgery

## 2019-02-11 ENCOUNTER — Other Ambulatory Visit: Payer: Self-pay

## 2019-02-11 ENCOUNTER — Encounter: Payer: Self-pay | Admitting: *Deleted

## 2019-02-11 ENCOUNTER — Observation Stay
Admission: RE | Admit: 2019-02-11 | Discharge: 2019-02-12 | Disposition: A | Discharge status: Home / Self Care | Payer: Medicare HMO | Source: Home / Self Care | Attending: Neurosurgery | Admitting: Neurosurgery

## 2019-02-11 DIAGNOSIS — M1711 Unilateral primary osteoarthritis, right knee: Secondary | ICD-10-CM | POA: Insufficient documentation

## 2019-02-11 DIAGNOSIS — Q766 Other congenital malformations of ribs: Secondary | ICD-10-CM | POA: Insufficient documentation

## 2019-02-11 DIAGNOSIS — K219 Gastro-esophageal reflux disease without esophagitis: Secondary | ICD-10-CM | POA: Insufficient documentation

## 2019-02-11 DIAGNOSIS — Z419 Encounter for procedure for purposes other than remedying health state, unspecified: Secondary | ICD-10-CM

## 2019-02-11 DIAGNOSIS — Z888 Allergy status to other drugs, medicaments and biological substances status: Secondary | ICD-10-CM | POA: Insufficient documentation

## 2019-02-11 DIAGNOSIS — R0689 Other abnormalities of breathing: Secondary | ICD-10-CM | POA: Insufficient documentation

## 2019-02-11 DIAGNOSIS — Z981 Arthrodesis status: Secondary | ICD-10-CM

## 2019-02-11 DIAGNOSIS — D689 Coagulation defect, unspecified: Secondary | ICD-10-CM | POA: Insufficient documentation

## 2019-02-11 DIAGNOSIS — G43909 Migraine, unspecified, not intractable, without status migrainosus: Secondary | ICD-10-CM | POA: Insufficient documentation

## 2019-02-11 DIAGNOSIS — M19031 Primary osteoarthritis, right wrist: Secondary | ICD-10-CM | POA: Insufficient documentation

## 2019-02-11 DIAGNOSIS — Z79899 Other long term (current) drug therapy: Secondary | ICD-10-CM | POA: Insufficient documentation

## 2019-02-11 DIAGNOSIS — M532X2 Spinal instabilities, cervical region: Secondary | ICD-10-CM | POA: Diagnosis not present

## 2019-02-11 DIAGNOSIS — M542 Cervicalgia: Secondary | ICD-10-CM | POA: Insufficient documentation

## 2019-02-11 HISTORY — PX: ANTERIOR CERVICAL DECOMP/DISCECTOMY FUSION: SHX1161

## 2019-02-11 LAB — POCT PREGNANCY, URINE: Preg Test, Ur: NEGATIVE

## 2019-02-11 LAB — ABO/RH: ABO/RH(D): O POS

## 2019-02-11 SURGERY — ANTERIOR CERVICAL DECOMPRESSION/DISCECTOMY FUSION 1 LEVEL
Anesthesia: General

## 2019-02-11 MED ORDER — SODIUM CHLORIDE 0.9% FLUSH: 3.0000 mL | INTRAVENOUS | Status: DC | PRN

## 2019-02-11 MED ORDER — OXYCODONE HCL 5 MG/5ML PO SOLN: 5.0000 mg | Freq: Once | ORAL | Status: DC | PRN

## 2019-02-11 MED ORDER — OXYCODONE HCL 5 MG PO TABS: 5.0000 mg | ORAL_TABLET | Freq: Once | ORAL | Status: DC | PRN

## 2019-02-11 MED ORDER — FAMOTIDINE 20 MG PO TABS
20.0000 mg | ORAL_TABLET | Freq: Once | ORAL | Status: AC
  Administered 2019-02-11: 10:00:00 20 mg via ORAL

## 2019-02-11 MED ORDER — SODIUM CHLORIDE FLUSH 0.9 % IV SOLN
INTRAVENOUS | Status: AC
  Filled 2019-02-11: qty 10

## 2019-02-11 MED ORDER — PROPOFOL 10 MG/ML IV BOLUS
INTRAVENOUS | Status: DC | PRN
  Administered 2019-02-11: 100 mg via INTRAVENOUS

## 2019-02-11 MED ORDER — LIDOCAINE HCL (CARDIAC) PF 100 MG/5ML IV SOSY
PREFILLED_SYRINGE | INTRAVENOUS | Status: DC | PRN
  Administered 2019-02-11: 100 mg via INTRAVENOUS

## 2019-02-11 MED ORDER — SODIUM CHLORIDE 0.9 % IV SOLN
INTRAVENOUS | Status: DC
  Administered 2019-02-11 (×3): via INTRAVENOUS

## 2019-02-11 MED ORDER — NORGESTIMATE-ETH ESTRADIOL 0.25-35 MG-MCG PO TABS: 1.0000 | ORAL_TABLET | Freq: Every day | ORAL | Status: DC

## 2019-02-11 MED ORDER — TRAZODONE HCL 50 MG PO TABS
50.0000 mg | ORAL_TABLET | Freq: Every day | ORAL | Status: DC
  Administered 2019-02-11: 21:00:00 50 mg via ORAL
  Filled 2019-02-11: qty 1

## 2019-02-11 MED ORDER — ONDANSETRON HCL 4 MG/2ML IJ SOLN
4.0000 mg | Freq: Once | INTRAMUSCULAR | Status: AC
  Administered 2019-02-11: 14:00:00 4 mg via INTRAVENOUS

## 2019-02-11 MED ORDER — CEFAZOLIN SODIUM-DEXTROSE 2-4 GM/100ML-% IV SOLN
INTRAVENOUS | Status: AC
  Filled 2019-02-11: qty 100

## 2019-02-11 MED ORDER — CEFAZOLIN SODIUM-DEXTROSE 2-4 GM/100ML-% IV SOLN
2.0000 g | INTRAVENOUS | Status: AC
  Administered 2019-02-11: 2 g via INTRAVENOUS

## 2019-02-11 MED ORDER — ONDANSETRON HCL 4 MG/2ML IJ SOLN
INTRAMUSCULAR | Status: AC
  Filled 2019-02-11: qty 2

## 2019-02-11 MED ORDER — PHENOL 1.4 % MT LIQD
1.0000 | OROMUCOSAL | Status: DC | PRN
  Filled 2019-02-11: qty 177

## 2019-02-11 MED ORDER — LACTATED RINGERS IV SOLN
INTRAVENOUS | Status: DC
  Administered 2019-02-11: 10:00:00 via INTRAVENOUS

## 2019-02-11 MED ORDER — KETAMINE HCL 50 MG/ML IJ SOLN
INTRAMUSCULAR | Status: DC | PRN
  Administered 2019-02-11: 50 mg via INTRAVENOUS

## 2019-02-11 MED ORDER — LIDOCAINE HCL (PF) 2 % IJ SOLN
INTRAMUSCULAR | Status: AC
  Filled 2019-02-11: qty 10

## 2019-02-11 MED ORDER — ONDANSETRON HCL 4 MG PO TABS
4.0000 mg | ORAL_TABLET | Freq: Four times a day (QID) | ORAL | Status: DC | PRN
  Administered 2019-02-11: 16:00:00 4 mg via ORAL
  Filled 2019-02-11: qty 1

## 2019-02-11 MED ORDER — MENTHOL 3 MG MT LOZG
1.0000 | LOZENGE | OROMUCOSAL | Status: DC | PRN
  Filled 2019-02-11: qty 9

## 2019-02-11 MED ORDER — FENTANYL CITRATE (PF) 100 MCG/2ML IJ SOLN
INTRAMUSCULAR | Status: AC
  Filled 2019-02-11: qty 2

## 2019-02-11 MED ORDER — METHOCARBAMOL 500 MG PO TABS
500.0000 mg | ORAL_TABLET | Freq: Four times a day (QID) | ORAL | Status: DC
  Administered 2019-02-11 – 2019-02-12 (×3): 500 mg via ORAL
  Filled 2019-02-11 (×4): qty 1

## 2019-02-11 MED ORDER — FAMOTIDINE 20 MG PO TABS
ORAL_TABLET | ORAL | Status: AC
  Administered 2019-02-11: 20 mg via ORAL
  Filled 2019-02-11: qty 1

## 2019-02-11 MED ORDER — DEXAMETHASONE SODIUM PHOSPHATE 10 MG/ML IJ SOLN
INTRAMUSCULAR | Status: DC | PRN
  Administered 2019-02-11: 10 mg via INTRAVENOUS

## 2019-02-11 MED ORDER — MIDAZOLAM HCL 2 MG/2ML IJ SOLN
INTRAMUSCULAR | Status: DC | PRN
  Administered 2019-02-11: 2 mg via INTRAVENOUS

## 2019-02-11 MED ORDER — ONDANSETRON HCL 4 MG/2ML IJ SOLN: 4.0000 mg | Freq: Four times a day (QID) | INTRAMUSCULAR | Status: DC | PRN

## 2019-02-11 MED ORDER — SODIUM CHLORIDE 0.9 % IV SOLN: 250.0000 mL | INTRAVENOUS | Status: DC

## 2019-02-11 MED ORDER — THROMBIN 5000 UNITS EX SOLR
CUTANEOUS | Status: DC | PRN
  Administered 2019-02-11: 5000 [IU] via TOPICAL

## 2019-02-11 MED ORDER — DEXMEDETOMIDINE HCL IN NACL 80 MCG/20ML IV SOLN
INTRAVENOUS | Status: AC
  Filled 2019-02-11: qty 20

## 2019-02-11 MED ORDER — DEXAMETHASONE SODIUM PHOSPHATE 10 MG/ML IJ SOLN
INTRAMUSCULAR | Status: AC
  Filled 2019-02-11: qty 1

## 2019-02-11 MED ORDER — POLYETHYLENE GLYCOL 3350 17 G PO PACK: 17.0000 g | PACK | Freq: Every day | ORAL | Status: DC | PRN

## 2019-02-11 MED ORDER — SUCCINYLCHOLINE CHLORIDE 20 MG/ML IJ SOLN
INTRAMUSCULAR | Status: DC | PRN
  Administered 2019-02-11: 100 mg via INTRAVENOUS

## 2019-02-11 MED ORDER — DIPHENHYDRAMINE HCL 50 MG/ML IJ SOLN
12.5000 mg | INTRAMUSCULAR | Status: AC | PRN
  Administered 2019-02-11: 14:00:00 12.5 mg via INTRAVENOUS
  Administered 2019-02-11: 14:00:00 6.25 mg via INTRAVENOUS

## 2019-02-11 MED ORDER — ROCURONIUM BROMIDE 50 MG/5ML IV SOLN
INTRAVENOUS | Status: AC
  Filled 2019-02-11: qty 1

## 2019-02-11 MED ORDER — EPINEPHRINE PF 1 MG/ML IJ SOLN
INTRAMUSCULAR | Status: AC
  Filled 2019-02-11: qty 1

## 2019-02-11 MED ORDER — SUGAMMADEX SODIUM 200 MG/2ML IV SOLN
INTRAVENOUS | Status: DC | PRN
  Administered 2019-02-11: 200 mg via INTRAVENOUS

## 2019-02-11 MED ORDER — SUGAMMADEX SODIUM 500 MG/5ML IV SOLN
INTRAVENOUS | Status: AC
  Filled 2019-02-11: qty 5

## 2019-02-11 MED ORDER — MAGNESIUM CITRATE PO SOLN
1.0000 | Freq: Once | ORAL | Status: DC | PRN
  Filled 2019-02-11: qty 296

## 2019-02-11 MED ORDER — ACETAMINOPHEN 650 MG RE SUPP: 650.0000 mg | RECTAL | Status: DC | PRN

## 2019-02-11 MED ORDER — BACITRACIN 50000 UNITS IM SOLR
INTRAMUSCULAR | Status: AC
  Filled 2019-02-11: qty 1

## 2019-02-11 MED ORDER — SODIUM CHLORIDE 0.9% FLUSH
INTRAVENOUS | Status: DC | PRN
  Administered 2019-02-11: 10 mL

## 2019-02-11 MED ORDER — FERROUS SULFATE 325 (65 FE) MG PO TABS
325.0000 mg | ORAL_TABLET | Freq: Every day | ORAL | Status: DC
  Administered 2019-02-12: 325 mg via ORAL
  Filled 2019-02-11: qty 1

## 2019-02-11 MED ORDER — PROPOFOL 10 MG/ML IV BOLUS
INTRAVENOUS | Status: AC
  Filled 2019-02-11: qty 20

## 2019-02-11 MED ORDER — PROMETHAZINE HCL 25 MG/ML IJ SOLN
6.2500 mg | INTRAMUSCULAR | Status: DC | PRN
  Administered 2019-02-11: 6.25 mg via INTRAVENOUS

## 2019-02-11 MED ORDER — SCOPOLAMINE 1 MG/3DAYS TD PT72
1.0000 | MEDICATED_PATCH | TRANSDERMAL | Status: DC
  Administered 2019-02-11: 1.5 mg via TRANSDERMAL

## 2019-02-11 MED ORDER — ROCURONIUM BROMIDE 100 MG/10ML IV SOLN
INTRAVENOUS | Status: DC | PRN
  Administered 2019-02-11: 20 mg via INTRAVENOUS
  Administered 2019-02-11: 10 mg via INTRAVENOUS

## 2019-02-11 MED ORDER — SODIUM CHLORIDE 0.9 % IR SOLN
Status: DC | PRN
  Administered 2019-02-11: 12:00:00

## 2019-02-11 MED ORDER — SENNA 8.6 MG PO TABS
1.0000 | ORAL_TABLET | Freq: Two times a day (BID) | ORAL | Status: DC
  Administered 2019-02-11 – 2019-02-12 (×2): 8.6 mg via ORAL
  Filled 2019-02-11 (×2): qty 1

## 2019-02-11 MED ORDER — ACETAMINOPHEN 325 MG PO TABS: 650.0000 mg | ORAL_TABLET | ORAL | Status: DC | PRN

## 2019-02-11 MED ORDER — FENTANYL CITRATE (PF) 100 MCG/2ML IJ SOLN
INTRAMUSCULAR | Status: DC | PRN
  Administered 2019-02-11 (×2): 50 ug via INTRAVENOUS

## 2019-02-11 MED ORDER — OXYCODONE HCL 5 MG PO TABS
10.0000 mg | ORAL_TABLET | ORAL | Status: DC | PRN
  Administered 2019-02-11: 16:00:00 10 mg via ORAL
  Filled 2019-02-11: qty 2

## 2019-02-11 MED ORDER — THROMBIN 5000 UNITS EX SOLR
CUTANEOUS | Status: AC
  Filled 2019-02-11: qty 10000

## 2019-02-11 MED ORDER — FENTANYL CITRATE (PF) 100 MCG/2ML IJ SOLN
25.0000 ug | INTRAMUSCULAR | Status: DC | PRN
  Administered 2019-02-11: 25 ug via INTRAVENOUS
  Administered 2019-02-11 (×3): 50 ug via INTRAVENOUS

## 2019-02-11 MED ORDER — SODIUM CHLORIDE 0.9% FLUSH
3.0000 mL | Freq: Two times a day (BID) | INTRAVENOUS | Status: DC
  Administered 2019-02-12: 3 mL via INTRAVENOUS

## 2019-02-11 MED ORDER — DIPHENHYDRAMINE HCL 50 MG/ML IJ SOLN
INTRAMUSCULAR | Status: AC
  Administered 2019-02-11: 14:00:00 6.25 mg via INTRAVENOUS
  Filled 2019-02-11: qty 1

## 2019-02-11 MED ORDER — SUCCINYLCHOLINE CHLORIDE 20 MG/ML IJ SOLN
INTRAMUSCULAR | Status: AC
  Filled 2019-02-11: qty 1

## 2019-02-11 MED ORDER — OXYCODONE HCL 5 MG PO TABS
5.0000 mg | ORAL_TABLET | ORAL | Status: DC | PRN
  Administered 2019-02-12: 5 mg via ORAL
  Filled 2019-02-11: qty 1

## 2019-02-11 MED ORDER — BUPIVACAINE-EPINEPHRINE (PF) 0.5% -1:200000 IJ SOLN
INTRAMUSCULAR | Status: DC | PRN
  Administered 2019-02-11: 4 mL

## 2019-02-11 MED ORDER — ONDANSETRON HCL 4 MG/2ML IJ SOLN
INTRAMUSCULAR | Status: DC | PRN
  Administered 2019-02-11: 4 mg via INTRAVENOUS

## 2019-02-11 MED ORDER — BUPIVACAINE HCL (PF) 0.5 % IJ SOLN
INTRAMUSCULAR | Status: AC
  Filled 2019-02-11: qty 30

## 2019-02-11 MED ORDER — SCOPOLAMINE 1 MG/3DAYS TD PT72
MEDICATED_PATCH | TRANSDERMAL | Status: AC
  Filled 2019-02-11: qty 1

## 2019-02-11 MED ORDER — METHOCARBAMOL 1000 MG/10ML IJ SOLN
500.0000 mg | Freq: Four times a day (QID) | INTRAVENOUS | Status: DC
  Administered 2019-02-11: 15:00:00 500 mg via INTRAVENOUS
  Filled 2019-02-11 (×7): qty 5

## 2019-02-11 MED ORDER — ACETAMINOPHEN 500 MG PO TABS
1000.0000 mg | ORAL_TABLET | Freq: Four times a day (QID) | ORAL | Status: AC
  Administered 2019-02-11 – 2019-02-12 (×4): 1000 mg via ORAL
  Filled 2019-02-11 (×4): qty 2

## 2019-02-11 MED ORDER — PROMETHAZINE HCL 25 MG/ML IJ SOLN
INTRAMUSCULAR | Status: AC
  Filled 2019-02-11: qty 1

## 2019-02-11 MED ORDER — HYDROMORPHONE HCL 1 MG/ML IJ SOLN: 0.5000 mg | INTRAMUSCULAR | Status: DC | PRN

## 2019-02-11 MED ORDER — PHENYLEPHRINE HCL (PRESSORS) 10 MG/ML IV SOLN
INTRAVENOUS | Status: DC | PRN
  Administered 2019-02-11 (×5): 100 ug via INTRAVENOUS

## 2019-02-11 MED ORDER — BISACODYL 5 MG PO TBEC: 5.0000 mg | DELAYED_RELEASE_TABLET | Freq: Every day | ORAL | Status: DC | PRN

## 2019-02-11 MED ORDER — MIDAZOLAM HCL 2 MG/2ML IJ SOLN
INTRAMUSCULAR | Status: AC
  Filled 2019-02-11: qty 2

## 2019-02-11 SURGICAL SUPPLY — 62 items
ALLOGRAFT LORDOTIC CC 7X11X14 (Bone Implant) ×1 IMPLANT
BASKET BONE COLLECTION (BASKET) IMPLANT
BIT DRILL ACP HYBRID 13 (BIT) ×2 IMPLANT
BULB RESERV EVAC DRAIN JP 100C (MISCELLANEOUS) IMPLANT
BUR NEURO DRILL SOFT 3.0X3.8M (BURR) ×2 IMPLANT
CANISTER SUCT 1200ML W/VALVE (MISCELLANEOUS) ×4 IMPLANT
CHLORAPREP W/TINT 26 (MISCELLANEOUS) ×4 IMPLANT
COUNTER NEEDLE 20/40 LG (NEEDLE) ×2 IMPLANT
COVER LIGHT HANDLE STERIS (MISCELLANEOUS) ×4 IMPLANT
COVER WAND RF STERILE (DRAPES) ×2 IMPLANT
CRADLE LAMINECT ARM (MISCELLANEOUS) ×2 IMPLANT
CUP MEDICINE 2OZ PLAST GRAD ST (MISCELLANEOUS) ×2 IMPLANT
DERMABOND ADVANCED (GAUZE/BANDAGES/DRESSINGS) ×1
DERMABOND ADVANCED .7 DNX12 (GAUZE/BANDAGES/DRESSINGS) ×1 IMPLANT
DRAIN CHANNEL JP 10F RND 20C F (MISCELLANEOUS) IMPLANT
DRAPE C-ARM 42X72 X-RAY (DRAPES) ×4 IMPLANT
DRAPE LAPAROTOMY 77X122 PED (DRAPES) ×2 IMPLANT
DRAPE MICROSCOPE SPINE 48X150 (DRAPES) ×2 IMPLANT
DRAPE POUCH INSTRU U-SHP 10X18 (DRAPES) ×2 IMPLANT
DRAPE SURG 17X11 SM STRL (DRAPES) ×8 IMPLANT
ELECT CAUTERY BLADE TIP 2.5 (TIP) ×2
ELECT REM PT RETURN 9FT ADLT (ELECTROSURGICAL) ×2
ELECTRODE CAUTERY BLDE TIP 2.5 (TIP) ×1 IMPLANT
ELECTRODE REM PT RTRN 9FT ADLT (ELECTROSURGICAL) ×1 IMPLANT
FEE INTRAOP MONITOR IMPULS NCS (MISCELLANEOUS) IMPLANT
FRAME EYE SHIELD (PROTECTIVE WEAR) ×2 IMPLANT
GLOVE BIOGEL PI IND STRL 7.0 (GLOVE) ×1 IMPLANT
GLOVE BIOGEL PI INDICATOR 7.0 (GLOVE) ×1
GLOVE SURG SYN 7.0 (GLOVE) ×4 IMPLANT
GLOVE SURG SYN 7.0 PF PI (GLOVE) ×2 IMPLANT
GLOVE SURG SYN 8.5  E (GLOVE) ×3
GLOVE SURG SYN 8.5 E (GLOVE) ×3 IMPLANT
GLOVE SURG SYN 8.5 PF PI (GLOVE) ×3 IMPLANT
GOWN SRG XL LVL 3 NONREINFORCE (GOWNS) ×1 IMPLANT
GOWN STRL NON-REIN TWL XL LVL3 (GOWNS) ×1
GOWN STRL REUS W/TWL MED LVL3 (GOWN DISPOSABLE) ×2 IMPLANT
GRADUATE 1200CC STRL 31836 (MISCELLANEOUS) ×2 IMPLANT
INTRAOP MONITOR FEE IMPULS NCS (MISCELLANEOUS)
INTRAOP MONITOR FEE IMPULSE (MISCELLANEOUS)
KIT TURNOVER KIT A (KITS) ×2 IMPLANT
MARKER SKIN DUAL TIP RULER LAB (MISCELLANEOUS) ×4 IMPLANT
NDL SAFETY ECLIPSE 18X1.5 (NEEDLE) ×1 IMPLANT
NEEDLE HYPO 18GX1.5 SHARP (NEEDLE) ×1
NEEDLE HYPO 22GX1.5 SAFETY (NEEDLE) ×2 IMPLANT
NS IRRIG 1000ML POUR BTL (IV SOLUTION) ×2 IMPLANT
NUVASIVE ACP HYBRID DRILL, 13MM ×1 IMPLANT
PACK LAMINECTOMY NEURO (CUSTOM PROCEDURE TRAY) ×2 IMPLANT
PIN CASPAR 14 (PIN) ×1 IMPLANT
PIN CASPAR 14MM (PIN) ×2
PLATE ACP 18 1-LEVEL (Plate) ×1 IMPLANT
SCREW ACP HYBRID 3.5X15 (Screw) ×1 IMPLANT
SPOGE SURGIFLO 8M (HEMOSTASIS) ×1
SPONGE KITTNER 5P (MISCELLANEOUS) ×3 IMPLANT
SPONGE SURGIFLO 8M (HEMOSTASIS) ×1 IMPLANT
STAPLER SKIN PROX 35W (STAPLE) IMPLANT
SUT V-LOC 90 ABS DVC 3-0 CL (SUTURE) ×2 IMPLANT
SUT VIC AB 3-0 SH 8-18 (SUTURE) ×2 IMPLANT
SYR 30ML LL (SYRINGE) ×2 IMPLANT
TAPE CLOTH 3X10 WHT NS LF (GAUZE/BANDAGES/DRESSINGS) ×2 IMPLANT
TOWEL OR 17X26 4PK STRL BLUE (TOWEL DISPOSABLE) ×6 IMPLANT
TRAY FOLEY MTR SLVR 16FR STAT (SET/KITS/TRAYS/PACK) IMPLANT
TUBING CONNECTING 10 (TUBING) ×2 IMPLANT

## 2019-02-11 NOTE — H&P (Signed)
  I have reviewed and confirmed my history and physical from 01/29/2019 with no additions or changes. Plan for C6-7 ACDF.  Risks and benefits reviewed.  Heart sounds normal no MRG. Chest Clear to Auscultation Bilaterally.

## 2019-02-11 NOTE — Progress Notes (Signed)
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  Evaluated in postop recovery still disoriented from anesthesia.  Complains of 10/10 posterior neck pain and nausea.  Denies any new upper extremity pain/numbness/tingling.  Physical Exam: Vitals:   02/11/19 1318 02/11/19 1319  BP: 121/80 121/80  Pulse: (!) 105   Resp: 15   Temp:  (!) 96.6 F (35.9 C)  SpO2: 100%    Strength: Unable to accurately assess at this time but patient able to move all extremities independently Sensation: Unable to accurately assess at this time Skin: Glue intact at incision site  Data:  No results for input(s): NA, K, CL, CO2, BUN, CREATININE, LABGLOM, GLUCOSE, CALCIUM in the last 168 hours. No results for input(s): AST, ALT, ALKPHOS in the last 168 hours.  Invalid input(s): TBILI   No results for input(s): WBC, HGB, HCT, PLT in the last 168 hours. No results for input(s): APTT, INR in the last 168 hours.       Other tests/results: Cervical x-rays pending  Assessment/Plan:  Brianna Arnold POD 0 status post C6-7 ACDF for cervical spine instability.  We will continue to monitor.  - mobilize - pain control - DVT prophylaxis - PTOT - Brace - Imaging -Postop nausea (Scopolamine patch)  Marin Olp PA-C Department of Neurosurgery

## 2019-02-11 NOTE — Anesthesia Preprocedure Evaluation (Signed)
Anesthesia Evaluation  Patient identified by MRN, date of birth, ID band Patient awake    Reviewed: Allergy & Precautions, H&P , NPO status , Patient's Chart, lab work & pertinent test results  History of Anesthesia Complications (+) PROLONGED EMERGENCE and history of anesthetic complications  Airway Mallampati: II  TM Distance: >3 FB Neck ROM: limited    Dental  (+) Chipped   Pulmonary shortness of breath and with exertion,           Cardiovascular Exercise Tolerance: Good (-) angina(-) Past MI      Neuro/Psych PSYCHIATRIC DISORDERS negative neurological ROS     GI/Hepatic negative GI ROS, Neg liver ROS, neg GERD  ,  Endo/Other  negative endocrine ROS  Renal/GU      Musculoskeletal   Abdominal   Peds  Hematology negative hematology ROS (+)   Anesthesia Other Findings Past Medical History: No date: Anemia No date: Jarcho-Levin syndrome  Past Surgical History: 10/2014: BACK SURGERY     Comment:  [place] 07/2014: CERVICAL SPINE SURGERY  BMI    Body Mass Index: 33.93 kg/m      Reproductive/Obstetrics negative OB ROS                             Anesthesia Physical Anesthesia Plan  ASA: III  Anesthesia Plan: General ETT   Post-op Pain Management:    Induction: Intravenous  PONV Risk Score and Plan: Ondansetron, Dexamethasone, Midazolam and Treatment may vary due to age or medical condition  Airway Management Planned: Oral ETT and Video Laryngoscope Planned  Additional Equipment:   Intra-op Plan:   Post-operative Plan: Extubation in OR and Possible Post-op intubation/ventilation  Informed Consent: I have reviewed the patients History and Physical, chart, labs and discussed the procedure including the risks, benefits and alternatives for the proposed anesthesia with the patient or authorized representative who has indicated his/her understanding and acceptance.      Dental Advisory Given  Plan Discussed with: Anesthesiologist, CRNA and Surgeon  Anesthesia Plan Comments: (Plan to keep C spine neutral for intubation  Patient consented for risks of anesthesia including but not limited to:  - adverse reactions to medications - damage to teeth, lips or other oral mucosa - sore throat or hoarseness - Damage to heart, brain, lungs or loss of life  Patient voiced understanding.)        Anesthesia Quick Evaluation

## 2019-02-11 NOTE — OR Nursing (Signed)
Patient woke up vomiting and was crying with pain, PA at bedside, ordered scopolamine patch placed behind the right ear, pain meds given with some phenergan and she began itching. Dr. Lavone Neri order benadryl and patient is coming down and relaxing a little still states pain is a 8 out 10.

## 2019-02-11 NOTE — Anesthesia Post-op Follow-up Note (Signed)
Anesthesia QCDR form completed.        

## 2019-02-11 NOTE — Transfer of Care (Signed)
Immediate Anesthesia Transfer of Care Note  Patient: Brianna Arnold  Procedure(s) Performed: ANTERIOR CERVICAL DECOMPRESSION/DISCECTOMY FUSION 1 LEVEL C6-7 (N/A )  Patient Location: PACU  Anesthesia Type:General  Level of Consciousness: awake  Airway & Oxygen Therapy: Patient Spontanous Breathing and Patient connected to face mask oxygen  Post-op Assessment: Report given to RN and Post -op Vital signs reviewed and stable  Post vital signs: Reviewed  Last Vitals:  Vitals Value Taken Time  BP 121/80 02/11/19 1318  Temp    Pulse 105 02/11/19 1319  Resp 10 02/11/19 1319  SpO2 100 % 02/11/19 1319  Vitals shown include unvalidated device data.  Last Pain:  Vitals:   02/11/19 0931  TempSrc: Tympanic  PainSc: 5       Patients Stated Pain Goal: 0 (37/29/02 1115)  Complications: No apparent anesthesia complications

## 2019-02-11 NOTE — Op Note (Signed)
Indications: Ms. 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  She had worsening symptoms with profound instability so surgery was recommended  Findings: success fixation of unstable joint  Preoperative Diagnosis: see above Postoperative Diagnosis: same   EBL: 20 ml IVF: 700 ml Drains: none Disposition: Extubated and Stable to PACU Complications: none  No foley catheter was placed.   Preoperative Note:   Risks of surgery discussed include: infection, bleeding, stroke, coma, death, paralysis, CSF leak, nerve/spinal cord injury, numbness, tingling, weakness, complex regional pain syndrome, recurrent stenosis and/or disc herniation, vascular injury, development of instability, neck/back pain, need for further surgery, persistent symptoms, development of deformity, and the risks of anesthesia. The patient understood these risks and agreed to proceed.  Procedure:  1) Anterior cervical diskectomy and fusion at C6-7 2) Anterior cervical instrumentation at C6-7 3) Structural allograft consisting of corticocancellous allograft   Procedure: After obtaining informed consent, the patient taken to the operating room, placed in supine position, general anesthesia induced.  The patient had a small shoulder roll placed behind their shoulders.  The patient received preop antibiotics and IV Decadron.  The patient had a neck incision outlined, was prepped and draped in usual sterile fashion. The incision was injected with local anesthetic.   An incision was opened, dissection taken down medial to the carotid artery and jugular vein, lateral to the trachea and esophagus.  The prevertebral fascia identified and a localizing x-ray demonstrated the correct level.  The longus colli were dissected laterally, and self-retaining retractors placed to open the operative field. The microscope was then brought into the field.  With this complete, distractor pins were placed  in the vertebral bodies of C6 and C7. The distractor was placed, and the anulus at C6/7 was opened using a bovie.  Curettes and pituitary rongeurs used to remove the majority of disk, then the drill was used to remove the posterior osteophyte and begin the foraminotomies. Meticulous hemostasis obtained.  Structural allograft was tapped behind the anterior lip of the vertebral body at C6/7 (7 mm).    The caspar distractor was removed, and bone wax used for hemostasis. A separate, 18 mm Nuvasive ACP plate was chosen.  Two screws placed in each vertebral body, respectively making sure the screws were behind the locking mechanism.  Final AP and lateral radiographs were taken.   With everything in good position, the wound was irrigated copiously with bacitracin-containing solution and meticulous hemostasis obtained.  Wound was closed in 2 layers using interrupted inverted 3-0 Vicryl sutures.  The wound was dressed with dermabond, the head of bed at 30 degrees, taken to recovery room in stable condition.  No new postop neurological deficits were identified.  Sponge and pattie counts were correct at the end of the procedure.    I performed the entire procedure with Marin Olp PA as an Pensions consultant.  Meade Maw MD

## 2019-02-11 NOTE — Anesthesia Procedure Notes (Addendum)
Procedure Name: Intubation Date/Time: 02/11/2019 11:40 AM Performed by: Rolla Plate, CRNA Pre-anesthesia Checklist: Patient identified, Patient being monitored, Timeout performed, Emergency Drugs available and Suction available Patient Re-evaluated:Patient Re-evaluated prior to induction Oxygen Delivery Method: Circle system utilized Preoxygenation: Pre-oxygenation with 100% oxygen Induction Type: IV induction Ventilation: Mask ventilation without difficulty Laryngoscope Size: 3 and McGraph Grade View: Grade I Tube type: Oral Tube size: 6.5 mm Number of attempts: 1 Airway Equipment and Method: Stylet Placement Confirmation: ETT inserted through vocal cords under direct vision,  positive ETCO2 and breath sounds checked- equal and bilateral Secured at: 20 cm Tube secured with: Tape Dental Injury: Teeth and Oropharynx as per pre-operative assessment

## 2019-02-11 NOTE — Progress Notes (Signed)
Cefazolin per Pharmacy consult:  28 yo F   Wt 66.6 kg  Surgical prophylaxis- Cefazolin 2 gm IV x 1  Chinita Greenland PharmD Clinical Pharmacist 02/11/2019

## 2019-02-11 NOTE — Progress Notes (Signed)
Alert and oriented female admitted to room 160.  Patient alert and oriented . Mother accompanies patient.  Neck brace in use for patient.

## 2019-02-12 ENCOUNTER — Observation Stay: Payer: Medicare HMO

## 2019-02-12 ENCOUNTER — Encounter: Payer: Self-pay | Admitting: Neurosurgery

## 2019-02-12 MED ORDER — METHOCARBAMOL 500 MG PO TABS: 500.0000 mg | ORAL_TABLET | Freq: Four times a day (QID) | ORAL | 0 refills | Status: DC | PRN

## 2019-02-12 MED ORDER — OXYCODONE HCL 5 MG PO TABS: 5.0000 mg | ORAL_TABLET | ORAL | 0 refills | Status: DC | PRN

## 2019-02-12 NOTE — Evaluation (Signed)
Physical Therapy Evaluation Patient Details Name: Brianna Arnold MRN: [id number] DOB: 08-25-1990 Today's Date: 02/12/2019   History of Present Illness  28 y/o female s/p C6-7 fusion, h/o lumbo-sacra fusion.  Clinical Impression  Pt did well with PT eval and gait training, though she did have the typical post surgery guarding and hesitancy.  She uses a SPC at baseline, but with R UE IV placement and mild weakness she needed the walker this date, and as she felt moderately unsteady in standing and c/o mild balance issues it was good to use the walker for safety and stability regardless.  Pt was able to circumambulate the nurses station, negotiate up/down a step w/o rails and w/o physical assist and generally appears to be safe to go home with the 24/7 assist that she has available.      Follow Up Recommendations Follow surgeon's recommendation for DC plan and follow-up therapies    Equipment Recommendations  None recommended by PT    Recommendations for Other Services       Precautions / Restrictions Precautions Precautions: Cervical Restrictions Weight Bearing Restrictions: No      Mobility  Bed Mobility Overal bed mobility: Independent             General bed mobility comments: Pt with slow and guarded transition to sitting, but with head elevated was able to transition to sitting w/o assist  Transfers Overall transfer level: Independent Equipment used: Rolling walker (2 wheeled)             General transfer comment: Pt is able to rise to standing w/o assist and though she was hesitant initially she did well and did not need assist on all 3 sit to stand efforts  Ambulation/Gait Ambulation/Gait assistance: Min guard Gait Distance (Feet): 250 Feet Assistive device: Rolling walker (2 wheeled)       General Gait Details: Pt typically uses cane in R hand (weakness and IV placement make her hesitant to use this) but used walker this date.  She was able to  circumambulate the nurses' station w/o issue though she did subjectively endorse some minimal unsteadiness and was glad to have the extra stability of the walker.    Stairs Stairs: Yes Stairs assistance: Min guard Stair Management: No rails;Forwards;With walker Number of Stairs: 1 General stair comments: trialed going up/down single step w/o rails to simulate in/out of home/den.  Pt did well and did not need phyiscal assist  Wheelchair Mobility    Modified Rankin (Stroke Patients Only)       Balance Overall balance assessment: Needs assistance   Sitting balance-Leahy Scale: Good Sitting balance - Comments: Pt with guarded posturing, but able to maintain balance w/o assist     Standing balance-Leahy Scale: Fair Standing balance comment: Pt with no overt LOBs or unsteadiness using walker, but reports still feeling a little off and not yet stable enough to trial with cane (baseline)                             Pertinent Vitals/Pain Pain Assessment: 0-10 Pain Score: 8  Pain Location: neck (anterior and posterior)    Home Living Family/patient expects to be discharged to:: Private residence Living Arrangements: Parent;Other relatives Available Help at Discharge: Available 24 hours/day;Family   Home Access: Stairs to enter Entrance Stairs-Rails: None Entrance Stairs-Number of Steps: 1 Home Layout: One level(does have a single step to get up from den (at [employer])) Home Equipment: Gilford Rile -  2 wheels;Walker - 4 wheels;Cane - single point      Prior Function Level of Independence: Independent with assistive device(s)         Comments: Pt reports she did limited driving, used SPC most of the time, able to be active for short periods of time     Hand Dominance        Extremity/Trunk Assessment   Upper Extremity Assessment Upper Extremity Assessment: Generalized weakness(WFL t/o, R grip slightly weaker, deferred overhead mvt)    Lower Extremity  Assessment Lower Extremity Assessment: Generalized weakness(WFL t/o, R LE minimally weaker than L)       Communication   Communication: No difficulties  Cognition Arousal/Alertness: Awake/alert Behavior During Therapy: WFL for tasks assessed/performed Overall Cognitive Status: Within Functional Limits for tasks assessed                                        General Comments      Exercises     Assessment/Plan    PT Assessment Patient needs continued PT services  PT Problem List Decreased strength;Decreased activity tolerance;Decreased balance;Decreased mobility;Decreased coordination;Decreased knowledge of use of DME;Decreased safety awareness;Pain       PT Treatment Interventions Gait training;Functional mobility training;Therapeutic activities;Stair training;DME instruction;Therapeutic exercise;Balance training;Neuromuscular re-education;Patient/family education    PT Goals (Current goals can be found in the Care Plan section)  Acute Rehab PT Goals Patient Stated Goal: get back to walking confidently in the community PT Goal Formulation: With patient Time For Goal Achievement: 02/26/19 Potential to Achieve Goals: Good    Frequency 7X/week   Barriers to discharge        Co-evaluation               AM-PAC PT "6 Clicks" Mobility  Outcome Measure Help needed turning from your back to your side while in a flat bed without using bedrails?: None Help needed moving from lying on your back to sitting on the side of a flat bed without using bedrails?: A Little Help needed moving to and from a bed to a chair (including a wheelchair)?: A Little Help needed standing up from a chair using your arms (e.g., wheelchair or bedside chair)?: None Help needed to walk in hospital room?: A Little Help needed climbing 3-5 steps with a railing? : A Little 6 Click Score: 20    End of Session Equipment Utilized During Treatment: Gait belt Activity Tolerance:  Patient limited by fatigue;Patient tolerated treatment well;Patient limited by pain Patient left: with chair alarm set;with call bell/phone within reach Nurse Communication: Mobility status      Time: 7782-4235 PT Time Calculation (min) (ACUTE ONLY): 31 min   Charges:   PT Evaluation $PT Eval Low Complexity: 1 Low PT Treatments $Gait Training: 8-22 mins        Malachi Pro, DPT 02/12/2019, 10:52 AM

## 2019-02-12 NOTE — Progress Notes (Signed)
Procedure: C6-7 ACDF Procedure date: 02/11/2019 Diagnosis: Cervical spinal instability   History: Brianna Arnold is s/p C6-7 ACDF for cervical spine instability  POD1: She is recovering well. Complains of throat hoarseness. No issues swallowing - tolerating breakfast. No new upper or lower extremity complaints including pain/numbness/tingling/weakness. Ambulating to bathroom and voiding without issue. Overall pain rated 4-5/10.   POD0: Patient tolerated procedure well without complication.  Evaluated in postop recovery still disoriented from anesthesia.  Complains of 10/10 posterior neck pain and nausea.  Denies any new upper extremity pain/numbness/tingling.  Physical Exam: Vitals:   02/12/19 0453 02/12/19 0737  BP: 116/68 113/79  Pulse: (!) 57 70  Resp:    Temp: 98.1 F (36.7 C) 97.7 F (36.5 C)  SpO2: 99% 100%   Strength: 5/5 throughout upper and lower extremities.  Sensation: intact and symmetric throughout upper an lower extremities, except for minimal decreased sensation right median aspect of upper arm.  Skin: Glue intact at incision site Collar present.   Data:  No results for input(s): NA, K, CL, CO2, BUN, CREATININE, LABGLOM, GLUCOSE, CALCIUM in the last 168 hours. No results for input(s): AST, ALT, ALKPHOS in the last 168 hours.  Invalid input(s): TBILI   No results for input(s): WBC, HGB, HCT, PLT in the last 168 hours. No results for input(s): APTT, INR in the last 168 hours.      Other tests/results:  EXAM: CERVICAL SPINE - 2-3 VIEW 02/12/2019  COMPARISON:  CT scan of December 05, 2018.  FINDINGS: Previous surgical posterior fusion involving the cervical spine and posterior occipital bone is noted. There is been interval surgical anterior fusion of what appears to be C6-7, but is difficult to be certain given the multiple congenital segmentation anomalies present.  IMPRESSION: Status post surgical anterior fusion of lower cervical  spine. Previous surgical posterior fusion of occipital bone and cervical spine is noted.  Assessment/Plan:  Brianna Arnold POD 1 status post C6-7 ACDF for cervical spine instability.  Ambulating, voiding, and eating without issue. Pain adequately controlled with oxycodone and muscle relaxer. Post op nausea has subsided.  Awaiting PT and OT  Brianna Olp PA-C Department of Neurosurgery

## 2019-02-12 NOTE — Discharge Summary (Signed)
Procedure: C6-7 ACDF Procedure date: 02/11/2019 Diagnosis: Cervical spinal instability   History: Brianna Arnold is s/p C6-7 ACDF for cervical spine instability  POD1: She is recovering well. Complains of throat hoarseness. No issues swallowing - tolerating breakfast. No new upper or lower extremity complaints including pain/numbness/tingling/weakness. Ambulating to bathroom and voiding without issue. Overall pain rated 4-5/10.   Update: received PT and OT. She completed these well - no home recommendations  POD0: Patient tolerated procedure well without complication.  Evaluated in postop recovery still disoriented from anesthesia.  Complains of 10/10 posterior neck pain and nausea.  Denies any new upper extremity pain/numbness/tingling.  Physical Exam: Vitals:   02/12/19 0737 02/12/19 1128  BP: 113/79 98/64  Pulse: 70 68  Resp:    Temp: 97.7 F (36.5 C) 98.1 F (36.7 C)  SpO2: 100% 99%   Strength: 5/5 throughout upper and lower extremities.  Sensation: intact and symmetric throughout upper an lower extremities, except for minimal decreased sensation right median aspect of upper arm.  Skin: Glue intact at incision site Collar present.   Data:  No results for input(s): NA, K, CL, CO2, BUN, CREATININE, LABGLOM, GLUCOSE, CALCIUM in the last 168 hours. No results for input(s): AST, ALT, ALKPHOS in the last 168 hours.  Invalid input(s): TBILI   No results for input(s): WBC, HGB, HCT, PLT in the last 168 hours. No results for input(s): APTT, INR in the last 168 hours.      Other tests/results:  EXAM: CERVICAL SPINE - 2-3 VIEW 02/12/2019  COMPARISON:  CT scan of December 05, 2018.  FINDINGS: Previous surgical posterior fusion involving the cervical spine and posterior occipital bone is noted. There is been interval surgical anterior fusion of what appears to be C6-7, but is difficult to be certain given the multiple congenital segmentation  anomalies present.  IMPRESSION: Status post surgical anterior fusion of lower cervical spine. Previous surgical posterior fusion of occipital bone and cervical spine is noted.  Assessment/Plan:  Brianna Arnold POD 1 status post C6-7 ACDF for cervical spine instability.  Ambulating, voiding, and eating without issue. Pain adequately controlled with oxycodone and muscle relaxer - will continue. Post op nausea has subsided. She is scheduled to follow up in 2 weeks to monitor progress.     Marin Olp PA-C Department of Neurosurgery

## 2019-02-12 NOTE — Progress Notes (Addendum)
D: Pt alert and oriented. Pt denies experiencing any pain at this time.   A: Pt and caregiver received discharge and medication education/information. Pt belongings were gathered taken w/pt home to include personal cane, AVS Doc, prescription, and cervical collar.  R: Pt and caregiver verbalized understanding of discharge and medication education/information.  Pt escorted to [facility] mall lobby via wheelchair by staff where family picked pt up

## 2019-02-12 NOTE — Discharge Instructions (Signed)

## 2019-02-12 NOTE — Anesthesia Postprocedure Evaluation (Signed)
Anesthesia Post Note  Patient: Deriona Altemose Eidson  Procedure(s) Performed: ANTERIOR CERVICAL DECOMPRESSION/DISCECTOMY FUSION 1 LEVEL C6-7 (N/A )  Patient location during evaluation: PACU Anesthesia Type: General Level of consciousness: awake and alert Pain management: pain level controlled Vital Signs Assessment: post-procedure vital signs reviewed and stable Respiratory status: spontaneous breathing, nonlabored ventilation, respiratory function stable and patient connected to nasal cannula oxygen Cardiovascular status: blood pressure returned to baseline and stable Postop Assessment: no apparent nausea or vomiting Anesthetic complications: no     Last Vitals:  Vitals:   02/12/19 0453 02/12/19 0737  BP: 116/68 113/79  Pulse: (!) 57 70  Resp:    Temp: 36.7 C 36.5 C  SpO2: 99% 100%    Last Pain:  Vitals:   02/12/19 0737  TempSrc: Oral  PainSc:                  Alphonsus Sias

## 2019-02-12 NOTE — Evaluation (Signed)
Occupational Therapy Evaluation Patient Details Name: Brianna Arnold MRN: [id number] DOB: 12/14/1990 Today's Date: 02/12/2019    History of Present Illness 28 y/o female s/p C6-7 fusion, h/o Jarcho-Levin syndrome, lumbo-sacra fusion.   Clinical Impression   Pt seen for OT evaluation this date, POD#1 from C6-7 fusion. Prior to hospital admission, pt was generally independent with ADL, driving, and volunteering virtually. SPC for mobility. Pt lives with her mother and brother and has adequate assist at home from family. Currently pt requires min-mod assist for LB ADL, cGA for functional mobility with RW 2/2 cervical pain, impaired strength, and balance. Pt/mother educated in cervical precautions, self care skills, AE/DME, and home/routines modifications to maximize safety and functional independence while minimizing falls risk and maintaining precautions. Pt/spouse verbalized understanding of all education/training provided. Handout provided to support recall and carry over of learned precautions/techniques for bed mobility, functional transfers, and self care skills. Recommend additional skilled OT services while hospitalized. Do not anticipate skilled OT needs following hospitalization.    Follow Up Recommendations  No OT follow up    Equipment Recommendations  None recommended by OT    Recommendations for Other Services       Precautions / Restrictions Precautions Precautions: Cervical;Fall Precaution Booklet Issued: Yes (comment) Restrictions Weight Bearing Restrictions: No      Mobility Bed Mobility Overal bed mobility: Independent             General bed mobility comments: deferred, pt in recliner; pt educated in log roll technique to maintain precautions  Transfers Overall transfer level: Independent Equipment used: Rolling walker (2 wheeled)             General transfer comment: Pt is able to rise to standing w/o assist and though she was hesitant  initially she did well and did not need assist on all 3 sit to stand efforts    Balance Overall balance assessment: Needs assistance   Sitting balance-Leahy Scale: Good Sitting balance - Comments: Pt with guarded posturing, but able to maintain balance w/o assist     Standing balance-Leahy Scale: Fair Standing balance comment: Pt with no overt LOBs or unsteadiness using walker, but reports still feeling a little off and not yet stable enough to trial with cane (baseline)                           ADL either performed or assessed with clinical judgement   ADL Overall ADL's : Needs assistance/impaired                                       General ADL Comments: Min-Mod A for LB ADL, CGA for functional transfers with RW; per pt/mother, adequate help at home for level of assist     Vision Baseline Vision/History: Wears glasses Wears Glasses: Reading only Patient Visual Report: No change from baseline Vision Assessment?: No apparent visual deficits     Perception     Praxis      Pertinent Vitals/Pain Pain Assessment: 0-10 Pain Score: 4  Pain Location: neck (anterior and posterior) Pain Descriptors / Indicators: Aching Pain Intervention(s): Limited activity within patient's tolerance;Monitored during session;Premedicated before session     Hand Dominance Right   Extremity/Trunk Assessment Upper Extremity Assessment Upper Extremity Assessment: Generalized weakness(WFL t/o, R grip slightly weaker, deferred overhead mvt)   Lower Extremity Assessment Lower Extremity Assessment: Generalized weakness(WFL  t/o, R LE minimally weaker than L)   Cervical / Trunk Assessment Cervical / Trunk Assessment: Other exceptions Cervical / Trunk Exceptions: s/p cervical fusion   Communication Communication Communication: No difficulties   Cognition Arousal/Alertness: Awake/alert Behavior During Therapy: WFL for tasks assessed/performed Overall Cognitive Status:  Within Functional Limits for tasks assessed                                     General Comments       Exercises Other Exercises Other Exercises: Pt/mother instructed in cervical spinal precautions with handout provided   Shoulder Instructions      Home Living Family/patient expects to be discharged to:: Private residence Living Arrangements: Parent;Other relatives Available Help at Discharge: Available 24 hours/day;Family   Home Access: Stairs to enter Entrance Stairs-Number of Steps: 1 Entrance Stairs-Rails: None Home Layout: One level(does have a single step to get up from den (at [employer]))     Bathroom Shower/Tub: Chief Strategy Officer: Standard     Home Equipment: Environmental consultant - 2 wheels;Walker - 4 wheels;Cane - single point;Adaptive equipment;Shower Engineering geologist: Reacher;Sock aid        Prior Functioning/Environment Level of Independence: Independent with assistive device(s)        Comments: Pt reports she did limited driving, used SPC most of the time, able to be active for short periods of time; volunteers virtually        OT Problem List: Pain;Decreased strength;Decreased range of motion;Decreased knowledge of precautions      OT Treatment/Interventions: Self-care/ADL training;Therapeutic exercise;Therapeutic activities;DME and/or AE instruction;Patient/family education    OT Goals(Current goals can be found in the care plan section) Acute Rehab OT Goals Patient Stated Goal: get back to walking confidently in the community OT Goal Formulation: With patient/family Time For Goal Achievement: 02/26/19 Potential to Achieve Goals: Good ADL Goals Pt Will Perform Lower Body Dressing: with modified independence;with adaptive equipment;with caregiver independent in assisting;sit to/from stand Additional ADL Goal #1: Pt will independently verbalize cervical spinal precautions and how to maintain during bathing, dressing, and  toileting tasks.  OT Frequency: Min 1X/week   Barriers to D/C:            Co-evaluation              AM-PAC OT "6 Clicks" Daily Activity     Outcome Measure Help from another person eating meals?: None Help from another person taking care of personal grooming?: None Help from another person toileting, which includes using toliet, bedpan, or urinal?: A Little Help from another person bathing (including washing, rinsing, drying)?: A Little Help from another person to put on and taking off regular upper body clothing?: None Help from another person to put on and taking off regular lower body clothing?: A Little 6 Click Score: 21   End of Session    Activity Tolerance: Patient tolerated treatment well Patient left: in chair;with call bell/phone within reach;with chair alarm set;with family/visitor present  OT Visit Diagnosis: Other abnormalities of gait and mobility (R26.89);Muscle weakness (generalized) (M62.81)                Time: 5462-7035 OT Time Calculation (min): 20 min Charges:  OT General Charges $OT Visit: 1 Visit OT Evaluation $OT Eval Low Complexity: 1 Low OT Treatments $Self Care/Home Management : 8-22 mins  Richrd Prime, MPH, MS, OTR/L ascom [phone] 02/12/19, 11:02 AM

## 2019-02-14 ENCOUNTER — Inpatient Hospital Stay
Admission: EM | Admit: 2019-02-14 | Discharge: 2019-02-15 | DRG: 472 | Disposition: A | Discharge status: Home / Self Care | Payer: Medicare HMO | Attending: Internal Medicine | Admitting: Internal Medicine

## 2019-02-14 ENCOUNTER — Encounter: Payer: Self-pay | Admitting: Emergency Medicine

## 2019-02-14 ENCOUNTER — Other Ambulatory Visit: Payer: Self-pay

## 2019-02-14 ENCOUNTER — Emergency Department: Payer: Medicare HMO

## 2019-02-14 DIAGNOSIS — R112 Nausea with vomiting, unspecified: Secondary | ICD-10-CM | POA: Diagnosis present

## 2019-02-14 DIAGNOSIS — R1110 Vomiting, unspecified: Secondary | ICD-10-CM

## 2019-02-14 DIAGNOSIS — Z981 Arthrodesis status: Secondary | ICD-10-CM

## 2019-02-14 DIAGNOSIS — M532X2 Spinal instabilities, cervical region: Secondary | ICD-10-CM | POA: Diagnosis present

## 2019-02-14 DIAGNOSIS — Z793 Long term (current) use of hormonal contraceptives: Secondary | ICD-10-CM

## 2019-02-14 DIAGNOSIS — Z791 Long term (current) use of non-steroidal anti-inflammatories (NSAID): Secondary | ICD-10-CM

## 2019-02-14 DIAGNOSIS — N3000 Acute cystitis without hematuria: Secondary | ICD-10-CM | POA: Diagnosis present

## 2019-02-14 DIAGNOSIS — Z79899 Other long term (current) drug therapy: Secondary | ICD-10-CM

## 2019-02-14 DIAGNOSIS — Z8249 Family history of ischemic heart disease and other diseases of the circulatory system: Secondary | ICD-10-CM

## 2019-02-14 DIAGNOSIS — K5909 Other constipation: Secondary | ICD-10-CM | POA: Diagnosis present

## 2019-02-14 DIAGNOSIS — Z888 Allergy status to other drugs, medicaments and biological substances status: Secondary | ICD-10-CM

## 2019-02-14 DIAGNOSIS — N12 Tubulo-interstitial nephritis, not specified as acute or chronic: Secondary | ICD-10-CM

## 2019-02-14 DIAGNOSIS — Z20828 Contact with and (suspected) exposure to other viral communicable diseases: Secondary | ICD-10-CM | POA: Diagnosis present

## 2019-02-14 DIAGNOSIS — R42 Dizziness and giddiness: Secondary | ICD-10-CM | POA: Diagnosis present

## 2019-02-14 DIAGNOSIS — D649 Anemia, unspecified: Secondary | ICD-10-CM | POA: Diagnosis present

## 2019-02-14 DIAGNOSIS — F329 Major depressive disorder, single episode, unspecified: Secondary | ICD-10-CM | POA: Diagnosis present

## 2019-02-14 DIAGNOSIS — Q7649 Other congenital malformations of spine, not associated with scoliosis: Secondary | ICD-10-CM

## 2019-02-14 DIAGNOSIS — Z823 Family history of stroke: Secondary | ICD-10-CM

## 2019-02-14 DIAGNOSIS — K5900 Constipation, unspecified: Secondary | ICD-10-CM

## 2019-02-14 LAB — COMPREHENSIVE METABOLIC PANEL
ALT: 12 U/L (ref 0–44)
AST: 17 U/L (ref 15–41)
Albumin: 3.9 g/dL (ref 3.5–5.0)
Alkaline Phosphatase: 55 U/L (ref 38–126)
Anion gap: 10 (ref 5–15)
BUN: 8 mg/dL (ref 6–20)
CO2: 26 mmol/L (ref 22–32)
Calcium: 9.2 mg/dL (ref 8.9–10.3)
Chloride: 99 mmol/L (ref 98–111)
Creatinine, Ser: 0.6 mg/dL (ref 0.44–1.00)
GFR calc Af Amer: >60 mL/min (ref >60)
GFR calc non Af Amer: >60 mL/min (ref >60)
Glucose, Bld: 87 mg/dL (ref 70–99)
Potassium: 3.9 mmol/L (ref 3.5–5.1)
Sodium: 135 mmol/L (ref 135–145)
Total Bilirubin: 0.7 mg/dL (ref 0.3–1.2)
Total Protein: 8 g/dL (ref 6.5–8.1)

## 2019-02-14 LAB — CBC
HCT: 36.7 % (ref 36.0–46.0)
Hemoglobin: 12 g/dL (ref 12.0–15.0)
MCH: 29.4 pg (ref 26.0–34.0)
MCHC: 32.7 g/dL (ref 30.0–36.0)
MCV: 90 fL (ref 80.0–100.0)
Platelets: 279 10*3/uL (ref 150–400)
RBC: 4.08 MIL/uL (ref 3.87–5.11)
RDW: 13.2 % (ref 11.5–15.5)
WBC: 11.7 10*3/uL — ABNORMAL HIGH (ref 4.0–10.5)
nRBC: 0 % (ref 0.0–0.2)

## 2019-02-14 LAB — URINALYSIS, COMPLETE (UACMP) WITH MICROSCOPIC
Bilirubin Urine: NEGATIVE
Glucose, UA: NEGATIVE mg/dL
Hgb urine dipstick: NEGATIVE
Ketones, ur: 20 mg/dL — AB
Nitrite: POSITIVE — AB
Protein, ur: NEGATIVE mg/dL
Specific Gravity, Urine: 1.016 (ref 1.005–1.030)
pH: 8 (ref 5.0–8.0)

## 2019-02-14 LAB — LIPASE, BLOOD: Lipase: 18 U/L (ref 11–51)

## 2019-02-14 LAB — SARS CORONAVIRUS 2 (TAT 6-24 HRS): SARS Coronavirus 2: NEGATIVE

## 2019-02-14 LAB — POCT PREGNANCY, URINE: Preg Test, Ur: NEGATIVE

## 2019-02-14 MED ORDER — ONDANSETRON HCL 4 MG/2ML IJ SOLN
4.0000 mg | Freq: Once | INTRAMUSCULAR | Status: AC
  Administered 2019-02-14: 4 mg via INTRAVENOUS
  Filled 2019-02-14: qty 2

## 2019-02-14 MED ORDER — SODIUM CHLORIDE 0.9 % IV SOLN
1.0000 g | INTRAVENOUS | Status: DC
  Filled 2019-02-14: qty 10

## 2019-02-14 MED ORDER — PROMETHAZINE HCL 25 MG/ML IJ SOLN: 12.5000 mg | Freq: Four times a day (QID) | INTRAMUSCULAR | Status: DC | PRN

## 2019-02-14 MED ORDER — SODIUM CHLORIDE 0.9 % IV BOLUS
1000.0000 mL | Freq: Once | INTRAVENOUS | Status: AC
  Administered 2019-02-14: 1000 mL via INTRAVENOUS

## 2019-02-14 MED ORDER — FLEET ENEMA 7-19 GM/118ML RE ENEM: 1.0000 | ENEMA | Freq: Every day | RECTAL | Status: DC | PRN

## 2019-02-14 MED ORDER — MORPHINE SULFATE (PF) 2 MG/ML IV SOLN
2.0000 mg | INTRAVENOUS | Status: DC | PRN
  Administered 2019-02-14: 2 mg via INTRAVENOUS
  Filled 2019-02-14: qty 1

## 2019-02-14 MED ORDER — LACTULOSE 10 GM/15ML PO SOLN: 30.0000 g | Freq: Every day | ORAL | Status: DC | PRN

## 2019-02-14 MED ORDER — MORPHINE SULFATE (PF) 4 MG/ML IV SOLN
4.0000 mg | Freq: Once | INTRAVENOUS | Status: AC
  Administered 2019-02-14: 4 mg via INTRAVENOUS
  Filled 2019-02-14: qty 1

## 2019-02-14 MED ORDER — FAMOTIDINE IN NACL 20-0.9 MG/50ML-% IV SOLN
20.0000 mg | Freq: Once | INTRAVENOUS | Status: AC
  Administered 2019-02-14: 20 mg via INTRAVENOUS
  Filled 2019-02-14: qty 50

## 2019-02-14 MED ORDER — ONDANSETRON HCL 4 MG/2ML IJ SOLN
4.0000 mg | Freq: Four times a day (QID) | INTRAMUSCULAR | Status: DC
  Administered 2019-02-14 (×2): 4 mg via INTRAVENOUS
  Filled 2019-02-14 (×2): qty 2

## 2019-02-14 MED ORDER — PROMETHAZINE HCL 25 MG/ML IJ SOLN
25.0000 mg | INTRAMUSCULAR | Status: AC
  Administered 2019-02-14: 25 mg via INTRAVENOUS

## 2019-02-14 MED ORDER — DOCUSATE SODIUM 100 MG PO CAPS
100.0000 mg | ORAL_CAPSULE | Freq: Two times a day (BID) | ORAL | Status: DC
  Administered 2019-02-14: 100 mg via ORAL

## 2019-02-14 MED ORDER — NORGESTIMATE-ETH ESTRADIOL 0.25-35 MG-MCG PO TABS: 1.0000 | ORAL_TABLET | Freq: Every day | ORAL | Status: DC

## 2019-02-14 MED ORDER — ACETAMINOPHEN 325 MG PO TABS: 650.0000 mg | ORAL_TABLET | Freq: Four times a day (QID) | ORAL | Status: DC | PRN

## 2019-02-14 MED ORDER — OXYCODONE HCL 5 MG PO TABS
5.0000 mg | ORAL_TABLET | ORAL | Status: DC | PRN
  Administered 2019-02-14 – 2019-02-15 (×3): 5 mg via ORAL
  Filled 2019-02-14 (×3): qty 1

## 2019-02-14 MED ORDER — ACETAMINOPHEN 650 MG RE SUPP: 650.0000 mg | Freq: Four times a day (QID) | RECTAL | Status: DC | PRN

## 2019-02-14 MED ORDER — CYCLOBENZAPRINE HCL 10 MG PO TABS
10.0000 mg | ORAL_TABLET | Freq: Three times a day (TID) | ORAL | Status: DC | PRN
  Administered 2019-02-15: 10 mg via ORAL
  Filled 2019-02-14: qty 1

## 2019-02-14 MED ORDER — SODIUM CHLORIDE 0.9 % IV SOLN
1.0000 g | Freq: Once | INTRAVENOUS | Status: AC
  Administered 2019-02-14: 1 g via INTRAVENOUS
  Filled 2019-02-14: qty 10

## 2019-02-14 MED ORDER — BISACODYL 10 MG RE SUPP
10.0000 mg | Freq: Once | RECTAL | Status: AC
  Administered 2019-02-14: 10 mg via RECTAL
  Filled 2019-02-14: qty 1

## 2019-02-14 MED ORDER — ENOXAPARIN SODIUM 40 MG/0.4ML ~~LOC~~ SOLN
40.0000 mg | SUBCUTANEOUS | Status: DC
  Administered 2019-02-14: 40 mg via SUBCUTANEOUS
  Filled 2019-02-14: qty 0.4

## 2019-02-14 MED ORDER — TRAZODONE HCL 50 MG PO TABS
50.0000 mg | ORAL_TABLET | Freq: Every day | ORAL | Status: DC
  Administered 2019-02-14: 50 mg via ORAL
  Filled 2019-02-14: qty 1

## 2019-02-14 MED ORDER — SODIUM CHLORIDE 0.9 % IV SOLN
INTRAVENOUS | Status: DC
  Administered 2019-02-14 (×2): via INTRAVENOUS

## 2019-02-14 MED ORDER — FAMOTIDINE IN NACL 20-0.9 MG/50ML-% IV SOLN
20.0000 mg | Freq: Two times a day (BID) | INTRAVENOUS | Status: DC
  Administered 2019-02-14: 20 mg via INTRAVENOUS
  Filled 2019-02-14 (×2): qty 50

## 2019-02-14 NOTE — ED Provider Notes (Signed)
[facility] Emergency Department Provider Note   ____________________________________________   First MD Initiated Contact with Patient 02/14/19 [phone]     (approximate)  I have reviewed the triage vital signs and the nursing notes.   HISTORY  Chief Complaint Emesis    HPI [name] is a 28 y.o. female recently discharged after having anterior cervical fusion  Patient reports that she has been developing abdominal pain, feeling constipated, barely passing much gas, but still passing small amounts.  She also started developing pain in her right flank over the last day.  She is vomited multiple times nonbloody at home  She is not had a bowel movement since leaving the hospital.  She reports she continues to have surgical site pain, but is taking oxycodone, but now is starting to have abdominal pain, frequent nausea vomiting several times.   Past Medical History:  Diagnosis Date   Anemia    Jarcho-Levin syndrome     Patient Active Problem List   Diagnosis Date Noted   S/P cervical spinal fusion 02/11/2019   Medication-induced delirium, acute, mixed level of activity 04/19/2015   Overdose 04/19/2015    Past Surgical History:  Procedure Laterality Date   ANTERIOR CERVICAL DECOMP/DISCECTOMY FUSION N/A 02/11/2019   Procedure: ANTERIOR CERVICAL DECOMPRESSION/DISCECTOMY FUSION 1 LEVEL C6-7;  Surgeon: Venetia NightYarbrough, Chester, MD;  Location: [facility] ORS;  Service: Neurosurgery;  Laterality: N/A;   BACK SURGERY  10/2014   lumber   CERVICAL SPINE SURGERY  07/2014    Prior to Admission medications   Medication Sig Start Date End Date Taking? Authorizing Provider  Cholecalciferol (VITAMIN D3) 125 MCG (5000 UT) TABS Take 5,000 mg by mouth daily.    [provider]  ferrous sulfate 325 (65 FE) MG tablet Take 325 mg by mouth daily with breakfast.    [provider]  methocarbamol (ROBAXIN) 500 MG tablet Take 1 tablet (500 mg  total) by mouth every 6 (six) hours as needed for muscle spasms. 02/12/19   Ivar DrapeFerri, Amanda, PA-C  oxyCODONE (OXY IR/ROXICODONE) 5 MG immediate release tablet Take 1 tablet (5 mg total) by mouth every 3 (three) hours as needed for moderate pain ((score 4 to 6)). 02/12/19   Ivar DrapeFerri, Amanda, PA-C  SPRINTEC 28 0.25-35 MG-MCG tablet Take 1 tablet by mouth daily. 01/02/19   [provider]  traZODone (DESYREL) 50 MG tablet Take 50 mg by mouth at bedtime.    [provider]  venlafaxine (EFFEXOR) 37.5 MG tablet Take 37.5 mg by mouth 2 (two) times daily. 02/06/19   [provider]    Allergies Tizanidine and Ativan [lorazepam]  No family history on file.  Social History Social History   Tobacco Use   Smoking status: Never Smoker   Smokeless tobacco: Never Used  Substance Use Topics   Alcohol use: Not on file    Comment: occas   Drug use: Never    Review of Systems Constitutional: No fever/chills Eyes: No visual changes. ENT: No sore throat.  Some discomfort at her surgical site but overall minimal Cardiovascular: Denies chest pain. Respiratory: Denies shortness of breath. Gastrointestinal: Stomach feels like, hurts all over very bloated feels very constipated. Genitourinary: Negative for dysuria.  Some pain in the right flank and back area Musculoskeletal: No weakness in the arms or legs.  Some pain that radiates down towards her left shoulder, reports Dr. Marcell BarlowYarborough is aware that already and thought related to her surgery Skin: Negative for rash. Neurological: Negative for headaches, areas  of focal weakness or numbness.    ____________________________________________   PHYSICAL EXAM:  VITAL SIGNS: ED Triage Vitals  Enc Vitals Group     BP 02/14/19 0923 90/71     Pulse Rate 02/14/19 0923 81     Resp --      Temp 02/14/19 0923 98 F (36.7 C)     Temp Source 02/14/19 0923 Oral     SpO2 02/14/19 0923 97 %     Weight 02/14/19 0925 146 lb (66.2 kg)       Height 02/14/19 0925 4\' 7"  (1.397 m)     Head Circumference --      Peak Flow --      Pain Score 02/14/19 0924 8     Pain Loc --      Pain Edu? --      Excl. in [place]? --     Constitutional: Alert and oriented.  Moderately ill-appearing, appears quite nauseated, emesis bag in hand, slightly diaphoretic. Eyes: Conjunctivae are normal. Head: Atraumatic. Nose: No congestion/rhinnorhea. Mouth/Throat: Mucous membranes are dry. Neck: No stridor.  Cardiovascular: Normal rate, regular rhythm. Grossly normal heart sounds.  Good peripheral circulation. Respiratory: Normal respiratory effort.  No retractions. Lungs CTAB. Gastrointestinal: Soft and reports mild tenderness throughout, slight distention with decreased bowel sounds.  Musculoskeletal: No lower extremity tenderness nor edema.  Moderate right-sided CVAT angle tenderness.  No CVA tenderness on the left. Neurologic:  Normal speech and language. No gross focal neurologic deficits are appreciated.  Skin:  Skin is warm, slightly sweaty and intact. No rash noted. Psychiatric: Mood and affect are normal. Speech and behavior are normal.  ____________________________________________   LABS (all labs ordered are listed, but only abnormal results are displayed)  Labs Reviewed  CBC - Abnormal; Notable for the following components:      Result Value   WBC 11.7 (*)    All other components within normal limits  URINALYSIS, COMPLETE (UACMP) WITH MICROSCOPIC - Abnormal; Notable for the following components:   Color, Urine YELLOW (*)    APPearance HAZY (*)    Ketones, ur 20 (*)    Nitrite POSITIVE (*)    Leukocytes,Ua TRACE (*)    Bacteria, UA MANY (*)    All other components within normal limits  URINE CULTURE  LIPASE, BLOOD  COMPREHENSIVE METABOLIC PANEL  POC URINE PREG, ED  POCT PREGNANCY, URINE   ____________________________________________  EKG   ____________________________________________  RADIOLOGY  Dg Abd Acute 2+v W  1v Chest  Result Date: 02/14/2019 CLINICAL DATA:  Abdominal pain and constipation. Jarcho-Levin syndrome EXAM: DG ABDOMEN ACUTE W/ 1V CHEST COMPARISON:  Chest radiograph April 19, 2015 FINDINGS: Frontal chest: There is atelectatic change in the left base with small left pleural effusion. Lungs elsewhere are clear. The heart size and pulmonary vascular normal. There are multiple rib defects, stable. Postoperative changes noted in the cervical, lower thoracic, and lumbar spine regions. Supine and upright abdomen: There is diffuse stool throughout colon. There is no bowel dilatation or air-fluid level to suggest bowel obstruction. No free air. IMPRESSION: Diffuse stool throughout colon consistent with constipation. No bowel obstruction or free air. Atelectasis left base with small left pleural effusion. Lungs elsewhere clear. Extensive postoperative changes in the spine. 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 Electronically Signed   By: Lowella Grip III M.D.   On: 02/14/2019 11:21    Imaging studies reviewed as above.  Notable constipation.  No obstruction.  Small left pleural effusion. ____________________________________________  PROCEDURES  Procedure(s) performed: None  Procedures  Critical Care performed: No  ____________________________________________   INITIAL IMPRESSION / ASSESSMENT AND PLAN / ED COURSE  Pertinent labs & imaging results that were available during my care of the patient were reviewed by me and considered in my medical decision making (see chart for details).   Patient returns for nausea vomiting abdominal pain.  This is setting of recent surgery.  Her surgical site appears well, healing without exudate.  No associated acute cardiac or pulmonary symptoms, but complains of abdominal pain, no bowel movement, decreased passing of gas and abdomen does appear consistent with likely significant constipation without focal pain or  discomfort.   ----------------------------------------- 12:44 PM on 02/14/2019 -----------------------------------------  Abdominal imaging reassuring.  I suspect patient likely has developed pyelonephritis with the right-sided CVA tenderness and urine findings.  Will cover with Rocephin and obtain culture.  This could also be a source for her intractable nausea and vomiting.  Discussed with Dr. Marcell Barlow, will admit the patient, she will come and be placed on a good bowel regimen, have follow-up with neurosurgery, and care under the hospitalist service.  After morphine her pain is well controlled but she continues to have intractable nausea the point she does not feel that she be able to trial anything by mouth at this time.  The patient and her mother both agreeable with plan.  Brianna Arnold was evaluated in Emergency Department on 02/14/2019 for the symptoms described in the history of present illness. She was evaluated in the context of the global COVID-19 pandemic, which necessitated consideration that the patient might be at risk for infection with the SARS-CoV-2 virus that causes COVID-19. Institutional protocols and algorithms that pertain to the evaluation of patients at risk for COVID-19 are in a state of rapid change based on information released by regulatory bodies including the CDC and federal and state organizations. These policies and algorithms were followed during the patient's care in the ED.  No noted Covid risk factors, recently tested     ____________________________________________   FINAL CLINICAL IMPRESSION(S) / ED DIAGNOSES  Final diagnoses:  Nausea and vomiting, intractability of vomiting not specified, unspecified vomiting type  Constipation, unspecified constipation type  Pyelonephritis        Note:  This document was prepared using Dragon voice recognition software and may include unintentional dictation errors       Sharyn Creamer,  MD 02/14/19 1245

## 2019-02-14 NOTE — ED Notes (Signed)
Patient transported to X-ray 

## 2019-02-14 NOTE — ED Notes (Signed)
Attempted to call report unsuccessful 

## 2019-02-14 NOTE — ED Notes (Signed)
Admitting Dr in room 

## 2019-02-14 NOTE — H&P (Signed)
[facility] -  at [facility]   PATIENT NAME: Brianna Arnold    MR#:  [id number]  DATE OF BIRTH:  07-26-1990  DATE OF ADMISSION:  02/14/2019  PRIMARY CARE PHYSICIAN: Marguarite Arbour, MD   REQUESTING/REFERRING PHYSICIAN: Dr Sharyn Creamer  CHIEF COMPLAINT:   Chief Complaint  Patient presents with  . Emesis    HISTORY OF PRESENT ILLNESS:  Brianna Arnold  is a 28 y.o. female with a known history of no recent neck surgery by Dr. Marcell Barlow.  She presents with nausea and vomiting.  She started having projectile vomiting last night at 5 PM.  She vomited 10 times.  No blood in the vomit.  Having some epigastric discomfort.  She has not had a bowel movement since Tuesday.  She had surgery on her neck on Wednesday.  She started having itching in the hospital and that persisted at home.  Yesterday she got very dizzy and then started vomiting.  She was also taking methocarbamol which they stopped.  Patient has some left arm and shoulder pain since the surgery.  PAST MEDICAL HISTORY:   Past Medical History:  Diagnosis Date  . Anemia   . Jarcho-Levin syndrome     PAST SURGICAL HISTORY:   Past Surgical History:  Procedure Laterality Date  . ANTERIOR CERVICAL DECOMP/DISCECTOMY FUSION N/A 02/11/2019   Procedure: ANTERIOR CERVICAL DECOMPRESSION/DISCECTOMY FUSION 1 LEVEL C6-7;  Surgeon: Venetia Night, MD;  Location: [facility] ORS;  Service: Neurosurgery;  Laterality: N/A;  . BACK SURGERY  10/2014   lumber  . CERVICAL SPINE SURGERY  07/2014    SOCIAL HISTORY:   Social History   Tobacco Use  . Smoking status: Never Smoker  . Smokeless tobacco: Never Used  Substance Use Topics  . Alcohol use: Not on file    Comment: occas    FAMILY HISTORY:   Family History  Problem Relation Age of Onset  . Hypertension Father     DRUG ALLERGIES:   Allergies  Allergen Reactions  . Ativan [Lorazepam] Other (See Comments)    Hallucinations  . Tizanidine  Palpitations    REVIEW OF SYSTEMS:  CONSTITUTIONAL: No fever, positive for chills and sweats.  Positive for fatigue.  EYES: No blurred or double vision.  EARS, NOSE, AND THROAT: No tinnitus or ear pain. No sore throat RESPIRATORY: No cough, shortness of breath, wheezing or hemoptysis.  CARDIOVASCULAR: No chest pain, orthopnea, edema.  GASTROINTESTINAL: Positive for nausea, vomiting.  No diarrhea.  Positive for constipation and epigastric abdominal pain. No blood in bowel movements GENITOURINARY: No dysuria, hematuria.  Some back pain. ENDOCRINE: No polyuria, nocturia,  HEMATOLOGY: No anemia, easy bruising or bleeding SKIN: No rash or lesion. MUSCULOSKELETAL: Neck pain.  Left arm and shoulder pain. NEUROLOGIC: No tingling, numbness, weakness.  PSYCHIATRY: Positive for depression.   MEDICATIONS AT HOME:   Prior to Admission medications   Medication Sig Start Date End Date Taking? Authorizing Provider  oxyCODONE (OXY IR/ROXICODONE) 5 MG immediate release tablet Take 1 tablet (5 mg total) by mouth every 3 (three) hours as needed for moderate pain ((score 4 to 6)). 02/12/19  Yes Ivar Drape, PA-C  traZODone (DESYREL) 50 MG tablet Take 50 mg by mouth at bedtime.   Yes [provider]  Cholecalciferol (VITAMIN D3) 125 MCG (5000 UT) TABS Take 5,000 mg by mouth daily.    [provider]  ferrous sulfate 325 (65 FE) MG tablet Take 325 mg by mouth daily with breakfast.    [provider]  methocarbamol (ROBAXIN) 500 MG tablet Take 1 tablet (500 mg total) by mouth every 6 (six) hours as needed for muscle spasms. Patient not taking: Reported on 02/14/2019 02/12/19   Ivar DrapeFerri, Amanda, PA-C  SPRINTEC 28 0.25-35 MG-MCG tablet Take 1 tablet by mouth daily. 01/02/19   [provider]  venlafaxine (EFFEXOR) 37.5 MG tablet Take 37.5 mg by mouth 2 (two) times daily. 02/06/19   [provider]      VITAL SIGNS:  Blood pressure 120/73, pulse 99, temperature 98 F  (36.7 C), temperature source Oral, resp. rate (!) 23, height 4\' 7"  (1.397 m), weight 66.2 kg, last menstrual period 01/28/2019, SpO2 99 %.  PHYSICAL EXAMINATION:  GENERAL:  28 y.o.-year-old patient lying in the bed with no acute distress.  EYES: Pupils equal, round, reactive to light and accommodation. No scleral icterus. Extraocular muscles intact.  HEENT: Head atraumatic, normocephalic. Oropharynx and nasopharynx clear.  NECK:  Supple, no jugular venous distention. No thyroid enlargement, no tenderness.  LUNGS: Normal breath sounds bilaterally, no wheezing, rales,rhonchi or crepitation. No use of accessory muscles of respiration.  CARDIOVASCULAR: S1, S2 normal. No murmurs, rubs, or gallops.  ABDOMEN: Soft, epigastric tenderness, nondistended. Bowel sounds present. No organomegaly or mass.  EXTREMITIES: No pedal edema, cyanosis, or clubbing.  NEUROLOGIC: Cranial nerves II through XII are intact. Muscle strength 5/5 in all extremities. Sensation intact. Gait not checked.  PSYCHIATRIC: The patient is alert and oriented x 3.  SKIN: No rash, lesion, or ulcer.   LABORATORY PANEL:   CBC Recent Labs  Lab 02/14/19 0935  WBC 11.7*  HGB 12.0  HCT 36.7  PLT 279   ------------------------------------------------------------------------------------------------------------------  Chemistries  Recent Labs  Lab 02/14/19 0935  NA 135  K 3.9  CL 99  CO2 26  GLUCOSE 87  BUN 8  CREATININE 0.60  CALCIUM 9.2  AST 17  ALT 12  ALKPHOS 55  BILITOT 0.7   ------------------------------------------------------------------------------------------------------------------    RADIOLOGY:  Dg Abd Acute 2+v W 1v Chest  Result Date: 02/14/2019 CLINICAL DATA:  Abdominal pain and constipation. Jarcho-Levin syndrome EXAM: DG ABDOMEN ACUTE W/ 1V CHEST COMPARISON:  Chest radiograph April 19, 2015 FINDINGS: Frontal chest: There is atelectatic change in the left base with small left pleural effusion.  Lungs elsewhere are clear. The heart size and pulmonary vascular normal. There are multiple rib defects, stable. Postoperative changes noted in the cervical, lower thoracic, and lumbar spine regions. Supine and upright abdomen: There is diffuse stool throughout colon. There is no bowel dilatation or air-fluid level to suggest bowel obstruction. No free air. IMPRESSION: Diffuse stool throughout colon consistent with constipation. No bowel obstruction or free air. Atelectasis left base with small left pleural effusion. Lungs elsewhere clear. Extensive postoperative changes in the spine. 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 Electronically Signed   By: Bretta BangWilliam  Woodruff III M.D.   On: 02/14/2019 11:21    EKG:   Ordered by me  IMPRESSION AND PLAN:   1.  Nausea and vomiting.  Start as needed IV Phenergan.  Standing dose Zofran.  Gentle IV fluids.  Supportive care. 2.  Acute cystitis.  IV Rocephin.  Follow-up urine culture. 3.  Constipation.  Limited secondary to the patient's nausea vomiting.  Once able to tolerate food I will have more ability to give oral medications.  We will give a suppository and as needed enemas.   4.  Recent neck surgery.  Dr. Marcell BarlowYarborough to follow-up tomorrow with the patient. 5.  Pain control with  oxycodone and morphine as needed.   All the records are reviewed and case discussed with ED provider. Management plans discussed with the patient, family and they are in agreement.  CODE STATUS: Full code  TOTAL TIME TAKING CARE OF THIS PATIENT: 50 minutes.    Loletha Grayer M.D on 02/14/2019 at 1:24 PM  Between 7am to 6pm - Pager - [phone]  After 6pm call admission pager [phone]  [facility] Office  [phone]  CC: Primary care physician; Idelle Crouch, MD

## 2019-02-14 NOTE — ED Notes (Signed)
Assisted pt to toilet.  Pt is requesting pain medication, reports nausea is improved.

## 2019-02-14 NOTE — ED Notes (Signed)
Pt given blanket.

## 2019-02-14 NOTE — ED Notes (Signed)
Pt given glycerine swabs

## 2019-02-14 NOTE — ED Notes (Signed)
Dr. Quale at bedside.  

## 2019-02-14 NOTE — ED Notes (Signed)
Report given to Emily, RN.

## 2019-02-14 NOTE — ED Triage Notes (Signed)
Pt arrived via POV with reports of vomiting that started around 5pm last night, pt states she has had 7-8 episodes of vomiting since yesterday. C/o minimal abdominal pain.  Pt had cervical fusion on 10/21 pt in [place] c-collar on arrival.  Pt reports no BM since surgery.   Pt taking zofran and pepto but states she vomited it back up.

## 2019-02-15 LAB — BASIC METABOLIC PANEL
Anion gap: 7 (ref 5–15)
BUN: 6 mg/dL (ref 6–20)
CO2: 24 mmol/L (ref 22–32)
Calcium: 8.1 mg/dL — ABNORMAL LOW (ref 8.9–10.3)
Chloride: 106 mmol/L (ref 98–111)
Creatinine, Ser: 0.63 mg/dL (ref 0.44–1.00)
GFR calc Af Amer: >60 mL/min (ref >60)
GFR calc non Af Amer: >60 mL/min (ref >60)
Glucose, Bld: 81 mg/dL (ref 70–99)
Potassium: 4.1 mmol/L (ref 3.5–5.1)
Sodium: 137 mmol/L (ref 135–145)

## 2019-02-15 LAB — CBC
HCT: 31.6 % — ABNORMAL LOW (ref 36.0–46.0)
Hemoglobin: 10 g/dL — ABNORMAL LOW (ref 12.0–15.0)
MCH: 29.4 pg (ref 26.0–34.0)
MCHC: 31.6 g/dL (ref 30.0–36.0)
MCV: 92.9 fL (ref 80.0–100.0)
Platelets: 218 10*3/uL (ref 150–400)
RBC: 3.4 MIL/uL — ABNORMAL LOW (ref 3.87–5.11)
RDW: 13.2 % (ref 11.5–15.5)
WBC: 6.8 10*3/uL (ref 4.0–10.5)
nRBC: 0 % (ref 0.0–0.2)

## 2019-02-15 LAB — HIV ANTIBODY (ROUTINE TESTING W REFLEX): HIV Screen 4th Generation wRfx: NONREACTIVE

## 2019-02-15 MED ORDER — PROMETHAZINE HCL 12.5 MG PO TABS: 12.5000 mg | ORAL_TABLET | Freq: Four times a day (QID) | ORAL | 0 refills | Status: DC | PRN

## 2019-02-15 MED ORDER — FAMOTIDINE 20 MG PO TABS: 20.0000 mg | ORAL_TABLET | Freq: Every day | ORAL | 0 refills | Status: AC

## 2019-02-15 MED ORDER — PROMETHAZINE HCL 25 MG PO TABS
12.5000 mg | ORAL_TABLET | Freq: Four times a day (QID) | ORAL | Status: DC | PRN
  Administered 2019-02-15: 12.5 mg via ORAL
  Filled 2019-02-15 (×2): qty 1

## 2019-02-15 MED ORDER — FAMOTIDINE 20 MG PO TABS
20.0000 mg | ORAL_TABLET | Freq: Two times a day (BID) | ORAL | Status: DC
  Administered 2019-02-15: 20 mg via ORAL
  Filled 2019-02-15: qty 1

## 2019-02-15 MED ORDER — POLYETHYLENE GLYCOL 3350 17 G PO PACK: 17.0000 g | PACK | Freq: Every day | ORAL | 0 refills | Status: DC | PRN

## 2019-02-15 MED ORDER — GABAPENTIN 100 MG PO CAPS: 100.0000 mg | ORAL_CAPSULE | Freq: Three times a day (TID) | ORAL | Status: DC

## 2019-02-15 MED ORDER — CYCLOBENZAPRINE HCL 10 MG PO TABS: 10.0000 mg | ORAL_TABLET | Freq: Three times a day (TID) | ORAL | 0 refills | Status: AC | PRN

## 2019-02-15 MED ORDER — POLYETHYLENE GLYCOL 3350 17 G PO PACK
17.0000 g | PACK | Freq: Every day | ORAL | Status: DC
  Administered 2019-02-15: 17 g via ORAL
  Filled 2019-02-15: qty 1

## 2019-02-15 MED ORDER — CEPHALEXIN 500 MG PO CAPS
500.0000 mg | ORAL_CAPSULE | Freq: Three times a day (TID) | ORAL | Status: DC
  Administered 2019-02-15: 500 mg via ORAL
  Filled 2019-02-15: qty 1

## 2019-02-15 MED ORDER — GABAPENTIN 250 MG/5ML PO SOLN: 100.0000 mg | Freq: Three times a day (TID) | ORAL | 0 refills | Status: DC

## 2019-02-15 MED ORDER — OXYCODONE HCL 5 MG PO TABS: 5.0000 mg | ORAL_TABLET | ORAL | Status: DC | PRN

## 2019-02-15 MED ORDER — CEPHALEXIN 500 MG PO CAPS: 500.0000 mg | ORAL_CAPSULE | Freq: Three times a day (TID) | ORAL | 0 refills | Status: DC

## 2019-02-15 NOTE — Progress Notes (Signed)
    Attending Progress Note  History: Brianna Arnold is here for nausea, vomiting, constipation, and probably UTI after elective ACDF on Wednesday 02/11/2019.  She presented to the ER yesterday with these symptoms.  She reports that she had 2 small BM yesterday and is feeling somewhat better today.  She has no additional complaints at this point.  Physical Exam: Vitals:   02/14/19 2304 02/15/19 0415  BP: 122/81 108/75  Pulse: 89 62  Resp:  18  Temp: 98.5 F (36.9 C) 97.7 F (36.5 C)  SpO2: 100% 100%    AA Ox3 CNI  Strength:5/5 throughout BUE and BLE Neck incision c/d/i, neck soft and flat  Data:  Recent Labs  Lab 02/14/19 0935 02/15/19 0708  NA 135 137  K 3.9 4.1  CL 99 106  CO2 26 24  BUN 8 6  CREATININE 0.60 0.63  GLUCOSE 87 81  CALCIUM 9.2 8.1*   Recent Labs  Lab 02/14/19 0935  AST 17  ALT 12  ALKPHOS 55     Recent Labs  Lab 02/14/19 0935 02/15/19 0708  WBC 11.7* 6.8  HGB 12.0 10.0*  HCT 36.7 31.6*  PLT 279 218   No results for input(s): APTT, INR in the last 168 hours.       Other tests/results: UA + for UTI  Assessment/Plan:  Brianna Arnold is here with postoperative constipation,N/V, and possible UTI.  She did not have a foley catheter placed with surgery.    - mobilize - pain control per outpatient protocol - constipation and n/v management per medicine.  - dispo planning per medicine - We will continue to follow while in hospital - OK to remove collar while in bed  Meade Maw MD, [facility] Department of Neurosurgery

## 2019-02-15 NOTE — Discharge Summary (Signed)
Brianna Arnold: Brianna Arnold    MR#:  [id number]  DATE OF BIRTH:  07/09/1990  DATE OF ADMISSION:  02/14/2019 ADMITTING PHYSICIAN: Brianna Grayer, MD  DATE OF DISCHARGE: 02/15/2019  3:00 PM  PRIMARY CARE PHYSICIAN: Brianna Crouch, MD    ADMISSION DIAGNOSIS:  Vomiting [R11.10] Pyelonephritis [N12] Constipation, unspecified constipation type [K59.00] Nausea and vomiting, intractability of vomiting not specified, unspecified vomiting type [R11.2]  DISCHARGE DIAGNOSIS:  Active Problems:   Nausea and vomiting   SECONDARY DIAGNOSIS:   Past Medical History:  Diagnosis Date  . Anemia   . Jarcho-Levin syndrome     HOSPITAL COURSE:   1.  Persistent nausea vomiting.  I did give standing dose Zofran IV and as needed IV Phenergan and gentle IV fluids.  The patient lost IV access and since she was feeling better she wanted to try oral medications.  She was converted over to oral Phenergan and diet was advanced.  She tolerated advanced diet and wanted to go home.  I did prescribe Phenergan as needed upon going home. 2.  Acute cystitis.  We gave IV Rocephin here in the hospital.  Follow-up urine culture which is still pending.  I did prescribe Keflex for a few more days upon going home. 3.  Constipation.  The patient did have a small bowel movement yesterday and today.  I prescribed MiraLAX upon going home. 4.  Recent neck surgery.  Seen by Dr. Cari Arnold.  He prescribed gabapentin which I prescribed upon going home. 5.  Pain control with oxycodone at home.  I told her she must eat with this or else she will get nauseous. 6.  Brianna Arnold syndrome  DISCHARGE CONDITIONS:   Satisfactory  CONSULTS OBTAINED:  Neurosurgery  DRUG ALLERGIES:   Allergies  Allergen Reactions  . Ativan [Lorazepam] Other (See Comments)    Hallucinations  . Tizanidine Palpitations    DISCHARGE MEDICATIONS:   Allergies as of 02/15/2019       Reactions   Ativan [lorazepam] Other (See Comments)   Hallucinations   Tizanidine Palpitations      Medication List    STOP taking these medications   methocarbamol 500 MG tablet Commonly known as: ROBAXIN   venlafaxine 37.5 MG tablet Commonly known as: EFFEXOR     TAKE these medications   cephALEXin 500 MG capsule Commonly known as: KEFLEX Take 1 capsule (500 mg total) by mouth every 8 (eight) hours.   cyclobenzaprine 10 MG tablet Commonly known as: FLEXERIL Take 1 tablet (10 mg total) by mouth 3 (three) times daily as needed for muscle spasms.   famotidine 20 MG tablet Commonly known as: PEPCID Take 1 tablet (20 mg total) by mouth daily.   ferrous sulfate 325 (65 FE) MG tablet Take 325 mg by mouth daily with breakfast.   gabapentin 250 MG/5ML solution Commonly known as: NEURONTIN Take 2 mLs (100 mg total) by mouth 3 (three) times daily.   oxyCODONE 5 MG immediate release tablet Commonly known as: Oxy IR/ROXICODONE Take 1 tablet (5 mg total) by mouth every 3 (three) hours as needed for moderate pain ((score 4 to 6)).   polyethylene glycol 17 g packet Commonly known as: MIRALAX / GLYCOLAX Take 17 g by mouth daily as needed.   promethazine 12.5 MG tablet Commonly known as: PHENERGAN Take 1 tablet (12.5 mg total) by mouth every 6 (six) hours as needed for nausea or vomiting.   Sprintec 28 0.25-35 MG-MCG tablet  Generic drug: norgestimate-ethinyl estradiol Take 1 tablet by mouth daily.   traZODone 50 MG tablet Commonly known as: DESYREL Take 50 mg by mouth at bedtime.   Vitamin D3 125 MCG (5000 UT) Tabs Take 5,000 mg by mouth daily.        DISCHARGE INSTRUCTIONS:   Follow-up Brianna Arnold as previously scheduled Follow-up PMD 5 days  If you experience worsening of your admission symptoms, develop shortness of breath, life threatening emergency, suicidal or homicidal thoughts you must seek medical attention immediately by calling 911 or calling your MD  immediately  if symptoms less severe.  You Must read complete instructions/literature along with all the possible adverse reactions/side effects for all the Medicines you take and that have been prescribed to you. Take any new Medicines after you have completely understood and accept all the possible adverse reactions/side effects.   Please note  You were cared for by a hospitalist during your hospital stay. If you have any questions about your discharge medications or the care you received while you were in the hospital after you are discharged, you can call the unit and asked to speak with the hospitalist on call if the hospitalist that took care of you is not available. Once you are discharged, your primary care physician will handle any further medical issues. Please note that NO REFILLS for any discharge medications will be authorized once you are discharged, as it is imperative that you return to your primary care physician (or establish a relationship with a primary care physician if you do not have one) for your aftercare needs so that they can reassess your need for medications and monitor your lab values.    Today   CHIEF COMPLAINT:   Chief Complaint  Patient presents with  . Emesis    HISTORY OF PRESENT ILLNESS:  Brianna Arnold  is a 28 y.o. female came in with persistent nausea vomiting   VITAL SIGNS:  Blood pressure 115/77, pulse 86, temperature 98.4 F (36.9 C), temperature source Oral, resp. rate 18, height 4\' 7"  (1.397 m), weight 66.2 kg, last menstrual period 01/28/2019, SpO2 98 %.   PHYSICAL EXAMINATION:  GENERAL:  28 y.o.-year-old patient lying in the bed with no acute distress.  EYES: Pupils equal, round, reactive to light and accommodation. No scleral icterus. Extraocular muscles intact.  HEENT: Head atraumatic, normocephalic. Oropharynx and nasopharynx clear.  NECK:  Supple, no jugular venous distention. No thyroid enlargement, no tenderness.  LUNGS: Normal  breath sounds bilaterally, no wheezing, rales,rhonchi or crepitation. No use of accessory muscles of respiration.  CARDIOVASCULAR: S1, S2 normal. No murmurs, rubs, or gallops.  ABDOMEN: Soft, non-tender, non-distended. Bowel sounds present. No organomegaly or mass.  EXTREMITIES: No pedal edema, cyanosis, or clubbing.  NEUROLOGIC: Cranial nerves II through XII are intact. Muscle strength 5/5 in all extremities. Sensation intact. Gait not checked.  PSYCHIATRIC: The patient is alert and oriented x 3.  SKIN: No obvious rash, lesion, or ulcer.   DATA REVIEW:   CBC Recent Labs  Lab 02/15/19 0708  WBC 6.8  HGB 10.0*  HCT 31.6*  PLT 218    Chemistries  Recent Labs  Lab 02/14/19 0935 02/15/19 0708  NA 135 137  K 3.9 4.1  CL 99 106  CO2 26 24  GLUCOSE 87 81  BUN 8 6  CREATININE 0.60 0.63  CALCIUM 9.2 8.1*  AST 17  --   ALT 12  --   ALKPHOS 55  --   BILITOT 0.7  --  Microbiology Results  Results for orders placed or performed during the hospital encounter of 02/14/19  SARS CORONAVIRUS 2 (TAT 6-24 HRS) Nasopharyngeal Nasopharyngeal Swab     Status: None   Collection Time: 02/14/19 12:52 PM   Specimen: Nasopharyngeal Swab  Result Value Ref Range Status   SARS Coronavirus 2 NEGATIVE NEGATIVE Final    Comment: (NOTE) SARS-CoV-2 target nucleic acids are NOT DETECTED. The SARS-CoV-2 RNA is generally detectable in upper and lower respiratory specimens during the acute phase of infection. Negative results do not preclude SARS-CoV-2 infection, do not rule out co-infections with other pathogens, and should not be used as the sole basis for treatment or other patient management decisions. Negative results must be combined with clinical observations, patient history, and epidemiological information. The expected result is Negative. Fact Sheet for Patients: HairSlick.no Fact Sheet for Healthcare  Providers: quierodirigir.com This test is not yet approved or cleared by the [place] FDA and  has been authorized for detection and/or diagnosis of SARS-CoV-2 by FDA under an Emergency Use Authorization (EUA). This EUA will remain  in effect (meaning this test can be used) for the duration of the COVID-19 declaration under Section 56 4(b)(1) of the Act, 21 U.S.C. section 360bbb-3(b)(1), unless the authorization is terminated or revoked sooner. Performed at [facility], [address] [place], [place] [address]     RADIOLOGY:  Dg Abd Acute 2+v W 1v Chest  Result Date: 02/14/2019 CLINICAL DATA:  Abdominal pain and constipation. Jarcho-Levin syndrome EXAM: DG ABDOMEN ACUTE W/ 1V CHEST COMPARISON:  Chest radiograph April 19, 2015 FINDINGS: Frontal chest: There is atelectatic change in the left base with small left pleural effusion. Lungs elsewhere are clear. The heart size and pulmonary vascular normal. There are multiple rib defects, stable. Postoperative changes noted in the cervical, lower thoracic, and lumbar spine regions. Supine and upright abdomen: There is diffuse stool throughout colon. There is no bowel dilatation or air-fluid level to suggest bowel obstruction. No free air. IMPRESSION: Diffuse stool throughout colon consistent with constipation. No bowel obstruction or free air. Atelectasis left base with small left pleural effusion. Lungs elsewhere clear. Extensive postoperative changes in the spine. 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 Electronically Signed   By: Bretta Bang III M.D.   On: 02/14/2019 11:21     Management plans discussed with the patient, and she is in agreement.  Mother was in the room when I was talking with the patient on the phone about discharge home.  CODE STATUS:     Code Status Orders  (From admission, onward)         Start     Ordered   02/14/19 1256  Full code  Continuous      02/14/19 1256        Code Status History    Date Active Date Inactive Code Status Order ID Comments User Context   02/11/2019 1537 02/12/2019 1658 Full Code [id number]  Ivar Drape, PA-C Inpatient   Advance Care Planning Activity      TOTAL TIME TAKING CARE OF THIS PATIENT: 35 minutes.    Alford Highland M.D on 02/15/2019 at 3:22 PM  Between 7am to 6pm - Pager - [phone]  After 6pm go to [email or web address] - password [email or web address]  [employer] [facility] Office  [phone]  CC: Primary care physician; Marguarite Arbour, MD

## 2019-02-15 NOTE — Progress Notes (Signed)
D: Pt alert and oriented x 4. Pt is ready for discharge.  A: Pt and caregiver received discharge and medication education/information. Pt belongings were gathered and readied for discharge to include a cervical collar.  R: Pt and caregiver verbalized understanding of discharge and medication education/information.  Pt escorted via wheelchair by staff to [facility] mall lobby where family picked up pt.

## 2019-02-15 NOTE — Plan of Care (Signed)

## 2019-02-17 LAB — URINE CULTURE: Culture: >=100000 — AB

## 2019-07-10 ENCOUNTER — Other Ambulatory Visit
Admission: RE | Admit: 2019-07-10 | Discharge: 2019-07-10 | Disposition: A | Discharge status: Home / Self Care | Payer: Medicare HMO | Source: Ambulatory Visit | Attending: Family Medicine | Admitting: Family Medicine

## 2019-07-10 DIAGNOSIS — R6889 Other general symptoms and signs: Secondary | ICD-10-CM | POA: Diagnosis not present

## 2019-07-10 DIAGNOSIS — R0789 Other chest pain: Secondary | ICD-10-CM | POA: Insufficient documentation

## 2019-07-10 DIAGNOSIS — I479 Paroxysmal tachycardia, unspecified: Secondary | ICD-10-CM | POA: Diagnosis not present

## 2019-07-10 DIAGNOSIS — R0602 Shortness of breath: Secondary | ICD-10-CM | POA: Insufficient documentation

## 2019-07-10 LAB — FIBRIN DERIVATIVES D-DIMER (ARMC ONLY): Fibrin derivatives D-dimer (ARMC): 252.45 ng/mL (FEU) (ref 0.00–499.00)

## 2019-10-15 ENCOUNTER — Telehealth: Payer: Self-pay | Admitting: *Deleted

## 2019-10-15 ENCOUNTER — Other Ambulatory Visit: Payer: Self-pay

## 2019-10-15 ENCOUNTER — Encounter: Payer: Self-pay | Admitting: Student in an Organized Health Care Education/Training Program

## 2019-10-15 ENCOUNTER — Ambulatory Visit
Payer: Medicare HMO | Attending: Student in an Organized Health Care Education/Training Program | Admitting: Student in an Organized Health Care Education/Training Program

## 2019-10-15 VITALS — BP 136/88 | HR 94 | Temp 98.4°F | Ht <= 58 in | Wt 150.0 lb

## 2019-10-15 DIAGNOSIS — M4105 Infantile idiopathic scoliosis, thoracolumbar region: Secondary | ICD-10-CM | POA: Diagnosis present

## 2019-10-15 DIAGNOSIS — Q766 Other congenital malformations of ribs: Secondary | ICD-10-CM | POA: Diagnosis present

## 2019-10-15 DIAGNOSIS — G894 Chronic pain syndrome: Secondary | ICD-10-CM

## 2019-10-15 DIAGNOSIS — Q7649 Other congenital malformations of spine, not associated with scoliosis: Secondary | ICD-10-CM

## 2019-10-15 DIAGNOSIS — Z981 Arthrodesis status: Secondary | ICD-10-CM

## 2019-10-15 DIAGNOSIS — M542 Cervicalgia: Secondary | ICD-10-CM

## 2019-10-15 DIAGNOSIS — G8929 Other chronic pain: Secondary | ICD-10-CM | POA: Insufficient documentation

## 2019-10-15 MED ORDER — BUPRENORPHINE 5 MCG/HR TD PTWK: 1.0000 | MEDICATED_PATCH | TRANSDERMAL | 0 refills | Status: DC

## 2019-10-15 NOTE — Progress Notes (Signed)
Patient: Brianna Arnold  Service Category: E/M  Provider: Gillis Santa, MD  DOB: 12-09-1990  DOS: 10/15/2019  Referring Provider: Meade Maw, MD  MRN: [id number]  Setting: Ambulatory outpatient  PCP: Idelle Crouch, MD  Type: New Patient  Specialty: Interventional Pain Management    Location: Office  Delivery: Face-to-face     Primary Reason(s) for Visit: Encounter for initial evaluation of one or more chronic problems (new to examiner) potentially causing chronic pain, and posing a threat to normal musculoskeletal function. (Level of risk: High) CC: Hip Pain (left)  HPI  Brianna Arnold is a 29 y.o. year old, female patient, who comes today to see [place] for the first time for an initial evaluation of her chronic pain. She has Medication-induced delirium, acute, mixed level of activity; Overdose; Status post cervical spinal fusion; Nausea and vomiting; Autosomal recessive spondylocostal dysostosis; Migraine without aura and without status migrainosus, not intractable; History of lumbar spinal fusion (T11- iliac); Infantile idiopathic scoliosis of thoracolumbar region; Cervicalgia; and Chronic pain syndrome on their problem list. Today she comes in for evaluation of her Hip Pain (left)  Pain Assessment: Location: Left (lower back) Shoulder Radiating: at times pain radiaties down below elbow, all round neck Onset: More than a month ago Duration: Chronic pain Quality: Tightness, Stabbing, Discomfort Severity: 7 /10 (subjective, self-reported pain score)  Effect on ADL: limits my daily activities Timing: Constant Modifying factors: meds, ice pack BP: 136/88  HR: 94  Onset and Duration: Gradual Cause of pain: birth defect Severity: Getting worse, NAS-11 at its worse: 10/10, NAS-11 at its best: 4/10, NAS-11 now: 6/10 and NAS-11 on the average: 7/10 Timing: Not influenced by the time of the day, During activity or exercise, After activity or exercise and After a period of immobility  Aggravating Factors: Bending, Lifiting, Prolonged sitting, Prolonged standing and Walking Alleviating Factors: Cold packs, Lying down, Medications, Sleeping and TENS Associated Problems: Numbness, Spasms, Pain that wakes patient up and Pain that does not allow patient to sleep Quality of Pain: Aching, Annoying, Intermittent, Deep, Disabling, Feeling of constriction, Sharp, Shooting, Stabbing, Tingling and Uncomfortable Previous Examinations or Tests: CT scan, Discogram, MRI scan, Nerve block, X-rays, Nerve conduction test, Neurological evaluation, Neurosurgical evaluation and Orthopedic evaluation Previous Treatments: Epidural steroid injections, Narcotic medications, Physical Therapy, Stretching exercises and TENS  Brianna Arnold is a very pleasant 29 year old female with a 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  She has diffuse kyphosis and scoliosis throughout her cervical, thoracic, lumbar spine.  She has a T11 to iliac bone spinal fusion.  She also has a C3-C7 cervical fusion that was done fairly recently with Dr. Cari Caraway.  She also endorses headaches.  She has spinal canal stenosis along with spinal myelopathy.  She cannot sit or stand for prolonged period of time.  She is in good spirits and has tried many medications and therapies for her condition.  She has tried various anti-inflammatories, Flexeril, meloxicam, tramadol, tizanidine, various opioids including oxycodone, hydrocodone, morphine in the past.  She also has chronic right C8 radiculopathy confirmed on nerve conduction velocity study in [facility] of 2016.  She has had trigger point injections in the past with Dr. Candelaria Stagers.  The majority of her pain is in her left trapezius and left shoulder region in addition to chronic spinal pain as a result of her congenital spinal costal dysplasia.  She is being referred here for pain management.  She is currently on  gabapentin 100 mg  3 times a day, Flexeril 10 mg 3 times daily as needed.  She is not on chronic opioid therapy.  Historic Controlled Substance Pharmacotherapy Review   Side-effects or Adverse reactions: None reported Historical Monitoring: The patient  reports no history of drug use. List of all UDS Test(s): Lab Results  Component Value Date   MDMA NONE DETECTED 04/19/2015   COCAINSCRNUR NONE DETECTED 04/19/2015   PCPSCRNUR NONE DETECTED 04/19/2015   THCU NONE DETECTED 04/19/2015   ETH <5 04/19/2015   List of other Serum/Urine Drug Screening Test(s):  Lab Results  Component Value Date   COCAINSCRNUR NONE DETECTED 04/19/2015   THCU NONE DETECTED 04/19/2015   ETH <5 04/19/2015   Historical Background Evaluation: [place] PMP: PDMP not reviewed this encounter. Six (6) year initial data search conducted.             [place] Department of public safety, offender search: Editor, commissioning Information) Non-contributory Risk Assessment Profile: Aberrant behavior: None observed or detected today Risk factors for fatal opioid overdose: None identified today Fatal overdose hazard ratio (HR): Calculation deferred Non-fatal overdose hazard ratio (HR): Calculation deferred Risk of opioid abuse or dependence: 0.7-3.0% with doses ? 36 MME/day and 6.1-26% with doses ? 120 MME/day. Substance use disorder (SUD) risk level: See below Personal History of Substance Abuse (SUD-Substance use disorder):  Alcohol: Negative  Illegal Drugs: Negative  Rx Drugs: Negative  ORT Risk Level calculation: Low Risk   Pharmacologic Plan: We will trial Butrans patch as below              Meds   Current Outpatient Medications:  .  albuterol (VENTOLIN HFA) 108 (90 Base) MCG/ACT inhaler, Inhale into the lungs every 6 (six) hours as needed for wheezing or shortness of breath., Disp: , Rfl:  .  Cholecalciferol (VITAMIN D3) 125 MCG (5000 UT) TABS, Take 5,000 mg by mouth daily., Disp: , Rfl:  .  Cyanocobalamin (B-12 COMPLIANCE  INJECTION) 1000 MCG/ML KIT, Inject as directed every 30 (thirty) days., Disp: , Rfl:  .  cyclobenzaprine (FLEXERIL) 10 MG tablet, Take 1 tablet (10 mg total) by mouth 3 (three) times daily as needed for muscle spasms., Disp: 30 tablet, Rfl: 0 .  famotidine (PEPCID) 20 MG tablet, Take 1 tablet (20 mg total) by mouth daily., Disp: 30 tablet, Rfl: 0 .  ferrous sulfate 325 (65 FE) MG tablet, Take 325 mg by mouth daily with breakfast., Disp: , Rfl:  .  gabapentin (NEURONTIN) 250 MG/5ML solution, Take 2 mLs (100 mg total) by mouth 3 (three) times daily., Disp: 470 mL, Rfl: 0 .  promethazine (PHENERGAN) 12.5 MG tablet, Take 1 tablet (12.5 mg total) by mouth every 6 (six) hours as needed for nausea or vomiting., Disp: 30 tablet, Rfl: 0 .  SPRINTEC 28 0.25-35 MG-MCG tablet, Take 1 tablet by mouth daily., Disp: , Rfl:  .  traMADol (ULTRAM) 50 MG tablet, Take by mouth every 6 (six) hours as needed., Disp: , Rfl:  .  traZODone (DESYREL) 50 MG tablet, Take 50 mg by mouth at bedtime., Disp: , Rfl:  .  vitamin B-12 (CYANOCOBALAMIN) 100 MCG tablet, Take 100 mcg by mouth daily., Disp: , Rfl:  .  buprenorphine (BUTRANS) 5 MCG/HR PTWK, Place 1 patch onto the skin every 7 (seven) days., Disp: 4 patch, Rfl: 0  Imaging Review    Narrative CLINICAL DATA:  Neck pain.  EXAM: CT CERVICAL SPINE WITHOUT CONTRAST  TECHNIQUE: Multidetector CT imaging of the cervical spine was performed without intravenous contrast.  Multiplanar CT image reconstructions were also generated.  COMPARISON:  CT cervical spine dated December 01, 2018.  FINDINGS: Alignment: Mild reversal of the normal cervical lordosis. Sagittal alignment is maintained.  Skull base and vertebrae: No acute fracture or other focal pathologic process. Prior posterior fusion of the cervical spine to the occipital bone. No evidence of hardware failure or loosening. Solid osseous fusion posteriorly from the occipital bone to C6-C7. The dens extends into the  foramen magnum causing mild stenosis, unchanged.  Extensive congenital segmentation abnormalities of the cervical spine are again noted. Congenital fusion of the left C1-C2 lateral masses. Congenital C2-C3 and C6-C7 fusion. 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 Graft between C3 and C5 on the left at the site of the absent C4 hemivertebra. Interbody fusion at C5-C6. Abnormal segmentation at T1-T3. 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  Soft tissues and spinal canal: No prevertebral fluid or swelling. No visible canal hematoma.  Disc levels:  As above.  Upper chest: Negative.  Other: Unchanged 1.5 cm right thyroid hypodense nodule.  IMPRESSION: 1.  No acute cervical spine fracture. 2. Similar-appearing extensive congenital segmentation abnormalities with solid posterior fusion of the cervical spine to the occipital bone. No hardware complication.   Electronically Signed By: Titus Dubin M.D. On: 12/05/2018 19:24    Narrative CLINICAL DATA:  Status post cervical fusion.  EXAM: CERVICAL SPINE - 2-3 VIEW  COMPARISON:  CT scan of December 05, 2018.  FINDINGS: Previous surgical posterior fusion involving the cervical spine and posterior occipital bone is noted. There is been interval surgical anterior fusion of what appears to be C6-7, but is difficult to be certain given the multiple congenital segmentation anomalies present.  IMPRESSION: Status post surgical anterior fusion of lower cervical spine. Previous surgical posterior fusion of occipital bone and cervical spine is noted.   Electronically Signed By: Marijo Conception M.D. On: 02/12/2019 07:40    Narrative CLINICAL DATA:  Spondylo costal dysostosis  EXAM: THORACIC SPINE 2 VIEWS  COMPARISON:  04/19/2015  FINDINGS: Partially visualized lower cervical hardware. Extensive vertebral anomalies of the thoracic spine. Bilateral rib anomalies. Posterior stabilization rod and multiple screw fixation of the lower  thoracic spine extending to the pelvis. Hardware appears grossly intact. There are no gross acute abnormalities.  IMPRESSION: 1. Extensive congenital vertebral anomalies 2. Grossly intact hardware spanning from the lower thoracic spine to the pelvis.   Electronically Signed By: Donavan Foil M.D. On: 06/25/2016 15:38    Narrative CLINICAL DATA:  Bilateral hip pain. History of lumbar fusion and congenital abnormalities.  EXAM: CT LUMBAR SPINE WITHOUT CONTRAST  TECHNIQUE: Multidetector CT imaging of the lumbar spine was performed without intravenous contrast administration. Multiplanar CT image reconstructions were also generated.  COMPARISON:  CT lumbar 11/12/2010  FINDINGS: Segmentation: Extensive segmentation abnormality throughout the entire lumbar spine extending into the visualized thoracic spine. Numbering these vertebra is problematic. Lowest open disc space is considered L5-S1 with screws in S1 and the iliac bones.  Hemivertebra on the left of L5, not developed on the right.  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  Disc levels: Bilateral posterior pedicle screw and rod fusion extending from T11 to the iliac bone. The rod is continuous without loosening or fracture.  There is posterior solid bony fusion throughout the lumbar spine.  No significant spinal stenosis.  IMPRESSION: Extensive congenital abnormality in the spine. Numerous levels of incomplete segmentation.  Posterior fusion hardware in good position. Solid posterior bony fusion. Negative for stenosis.   Electronically Signed By: Franchot Gallo M.D. On: 12/01/2018 18:44  DG Lumbar Spine 2-3 Views   Narrative CLINICAL DATA:  Spondylo costal dysostosis  EXAM: LUMBAR SPINE - 2-3 VIEW  COMPARISON:  12/19/2012, 11/12/2010  FINDINGS: Posterior stabilization rod and multiple screw fixation of the thoracolumbar spine with fixating screws present through the mid sacrum and across the SI joints. The hardware appears grossly intact. There are multiple congenital vertebral anomalies of the thoracolumbar spine. There are no acute abnormalities.  IMPRESSION: 1. Grossly intact fixating hardware in the thoracolumbar spine and sacrum. 2. Extensive congenital vertebral anomalies of the spine as previously described.   Electronically Signed By: Donavan Foil M.D. On: 06/25/2016 15:40    Complexity Note: Imaging results reviewed. Results shared with Brianna Arnold, using Layman's terms.                         ROS  Cardiovascular: No reported cardiovascular signs or symptoms such as High blood pressure, coronary artery disease, abnormal heart rate or rhythm, heart attack, blood thinner therapy or heart weakness and/or failure Pulmonary or Respiratory: Lung problems and Coughing up mucus (Bronchitis) Neurological: Curved spine Psychological-Psychiatric: No reported psychological or psychiatric signs or symptoms such as difficulty sleeping, anxiety, depression, delusions or hallucinations (schizophrenial), mood swings (bipolar disorders) or suicidal ideations or attempts Gastrointestinal: Reflux or heatburn Genitourinary: Recurrent Urinary Tract infections Hematological: Weakness due to low blood hemoglobin or red blood cell count (Anemia) Endocrine: No reported endocrine signs or symptoms such as high or low blood sugar, rapid heart rate due to high thyroid levels, obesity or weight gain due to slow thyroid or thyroid disease Rheumatologic: No reported rheumatological signs and symptoms such as fatigue, joint pain, tenderness, swelling, redness, heat, stiffness, decreased range of motion, with  or without associated rash Musculoskeletal: Negative for myasthenia gravis, muscular dystrophy, multiple sclerosis or malignant hyperthermia Work History: Disabled  Allergies  Brianna Arnold is allergic to ativan [lorazepam] and tizanidine.  Laboratory Chemistry Profile   Renal Lab Results  Component Value Date   BUN 6 02/15/2019   CREATININE 0.63 02/15/2019   GFRAA >60 02/15/2019   GFRNONAA >60 02/15/2019   PROTEINUR NEGATIVE 02/14/2019     Electrolytes Lab Results  Component Value Date   NA 137 02/15/2019   K 4.1 02/15/2019   CL 106 02/15/2019   CALCIUM 8.1 (L) 02/15/2019     Hepatic Lab Results  Component Value Date   AST 17 02/14/2019   ALT 12 02/14/2019   ALBUMIN 3.9 02/14/2019   ALKPHOS 55 02/14/2019   LIPASE 18 02/14/2019     ID Lab Results  Component Value Date   HIV NON REACTIVE 02/14/2019   SARSCOV2NAA NEGATIVE 02/14/2019   STAPHAUREUS POSITIVE (A) 02/04/2019   MRSAPCR NEGATIVE 02/04/2019   PREGTESTUR NEGATIVE 02/14/2019     Bone No results found for: [employer], [id number], [id number], [id number], 25OHVITD1, 25OHVITD2, 25OHVITD3, TESTOFREE, TESTOSTERONE   Endocrine Lab Results  Component Value Date   GLUCOSE 81 02/15/2019   GLUCOSEU NEGATIVE 02/14/2019     Neuropathy Lab Results  Component Value Date   HIV NON REACTIVE 02/14/2019     CNS No results found for: COLORCSF, APPEARCSF, RBCCOUNTCSF, WBCCSF, POLYSCSF, LYMPHSCSF, EOSCSF, PROTEINCSF,  GLUCCSF, JCVIRUS, CSFOLI, IGGCSF, LABACHR, ACETBL, LABACHR, ACETBL   Inflammation (CRP: Acute  ESR: Chronic) Lab Results  Component Value Date   LATICACIDVEN 1.6 04/19/2015     Rheumatology No results found for: RF, ANA, LABURIC, URICUR, LYMEIGGIGMAB, LYMEABIGMQN, HLAB27   Coagulation Lab Results  Component Value Date   INR 1.1 02/04/2019   LABPROT 13.9 02/04/2019   APTT 33 02/04/2019   PLT 218 02/15/2019     Cardiovascular Lab Results  Component Value Date   CKTOTAL 81 04/19/2015   HGB 10.0 (L)  02/15/2019   HCT 31.6 (L) 02/15/2019     Screening Lab Results  Component Value Date   SARSCOV2NAA NEGATIVE 02/14/2019   STAPHAUREUS POSITIVE (A) 02/04/2019   MRSAPCR NEGATIVE 02/04/2019   HIV NON REACTIVE 02/14/2019   PREGTESTUR NEGATIVE 02/14/2019     Cancer No results found for: CEA, CA125, LABCA2   Allergens No results found for: ALMOND, APPLE, ASPARAGUS, AVOCADO, BANANA, BARLEY, BASIL, BAYLEAF, GREENBEAN, LIMABEAN, WHITEBEAN, BEEFIGE, REDBEET, BLUEBERRY, BROCCOLI, CABBAGE, MELON, CARROT, CASEIN, CASHEWNUT, CAULIFLOWER, CELERY     Note: Lab results reviewed.   PFSH  Drug: Brianna Arnold  reports no history of drug use. Alcohol:  has no history on file for alcohol use. Tobacco:  reports that she has never smoked. She has never used smokeless tobacco. Medical:  has a past medical history of Anemia and Jarcho-Levin syndrome. Family: family history includes Hypertension in her father.  Past Surgical History:  Procedure Laterality Date  . ANTERIOR CERVICAL DECOMP/DISCECTOMY FUSION N/A 02/11/2019   Procedure: ANTERIOR CERVICAL DECOMPRESSION/DISCECTOMY FUSION 1 LEVEL C6-7;  Surgeon: Meade Maw, MD;  Location: [facility] ORS;  Service: Neurosurgery;  Laterality: N/A;  . BACK SURGERY  10/2014   lumber  . CERVICAL SPINE SURGERY  07/2014   Active Ambulatory Problems    Diagnosis Date Noted  . Medication-induced delirium, acute, mixed level of activity 04/19/2015  . Overdose 04/19/2015  . Status post cervical spinal fusion 02/11/2019  . Nausea and vomiting 02/14/2019  . Autosomal recessive spondylocostal dysostosis 10/15/2019  . Migraine without aura and without status migrainosus, not intractable 09/07/2014  . History of lumbar spinal fusion (T11- iliac) 10/15/2019  . Infantile idiopathic scoliosis of thoracolumbar region 10/15/2019  . Cervicalgia 10/15/2019  . Chronic pain syndrome 10/15/2019   Resolved Ambulatory Problems    Diagnosis Date Noted  . No Resolved Ambulatory  Problems   Past Medical History:  Diagnosis Date  . Anemia   . Jarcho-Levin syndrome    Constitutional Exam  General appearance: Well nourished, well developed, and well hydrated. In no apparent acute distress Vitals:   10/15/19 0901  BP: 136/88  Pulse: 94  Temp: 98.4 F (36.9 C)  SpO2: 100%  Weight: 150 lb (68 kg)  Height: '4\' 8"'  (1.422 m)   BMI Assessment: Estimated body mass index is 33.63 kg/m as calculated from the following:   Height as of this encounter: '4\' 8"'  (1.422 m).   Weight as of this encounter: 150 lb (68 kg).  BMI interpretation table: BMI level Category Range association with higher incidence of chronic pain  <18 kg/m2 Underweight   18.5-24.9 kg/m2 Ideal body weight   25-29.9 kg/m2 Overweight Increased incidence by 20%  30-34.9 kg/m2 Obese (Class I) Increased incidence by 68%  35-39.9 kg/m2 Severe obesity (Class II) Increased incidence by 136%  >40 kg/m2 Extreme obesity (Class III) Increased incidence by 254%   Patient's current BMI Ideal Body weight  Body mass index is 33.63 kg/m. Patient must be at  least 60 in tall to calculate ideal body weight   BMI Readings from Last 4 Encounters:  10/15/19 33.63 kg/m  02/14/19 33.93 kg/m  02/11/19 33.93 kg/m  02/04/19 34.12 kg/m   Wt Readings from Last 4 Encounters:  10/15/19 150 lb (68 kg)  02/14/19 146 lb (66.2 kg)  02/11/19 146 lb (66.2 kg)  02/04/19 146 lb 12.8 oz (66.6 kg)    Psych/Mental status: Alert, oriented x 3 (person, place, & time)       Eyes: [name] Respiratory: No evidence of acute respiratory distress  Cervical Spine Exam  Skin & Axial Inspection: Well healed scar from previous spine surgery detected Alignment: Asymmetric Functional ROM: Pain restricted ROM      Stability: No instability detected Muscle Tone/Strength: Functionally intact. No obvious neuro-muscular anomalies detected. Sensory (Neurological): Neuropathic pain pattern Palpation: No palpable anomalies               Upper Extremity (UE) Exam    Side: Right upper extremity  Side: Left upper extremity  Skin & Extremity Inspection: Skin color, temperature, and hair growth are WNL. No peripheral edema or cyanosis. No masses, redness, swelling, asymmetry, or associated skin lesions. No contractures.  Skin & Extremity Inspection: Skin color, temperature, and hair growth are WNL. No peripheral edema or cyanosis. No masses, redness, swelling, asymmetry, or associated skin lesions. No contractures.  Functional ROM: Unrestricted ROM          Functional ROM: Decreased ROM for all joints of upper extremity  Muscle Tone/Strength: Functionally intact. No obvious neuro-muscular anomalies detected.  Muscle Tone/Strength: Functionally intact. No obvious neuro-muscular anomalies detected.  Sensory (Neurological): Dermatomal pain pattern          Sensory (Neurological): Dermatomal pain pattern          Palpation: No palpable anomalies              Palpation: No palpable anomalies              Provocative Test(s):  Phalen's test: deferred Tinel's test: deferred Apley's scratch test (touch opposite shoulder):  Action 1 (Across chest): deferred Action 2 (Overhead): deferred Action 3 (LB reach): deferred   Provocative Test(s):  Phalen's test: deferred Tinel's test: deferred Apley's scratch test (touch opposite shoulder):  Action 1 (Across chest): Decreased ROM Action 2 (Overhead): Decreased ROM Action 3 (LB reach): Decreased ROM    Thoracic Spine Area Exam  Skin & Axial Inspection: Well healed scar from previous spine surgery detected Alignment: Symmetrical Functional ROM: Mechanically restricted ROM Stability: No instability detected Muscle Tone/Strength: Functionally intact. No obvious neuro-muscular anomalies detected. Sensory (Neurological): Neurogenic pain pattern Muscle strength & Tone: No palpable anomalies  Lumbar Exam  Skin & Axial Inspection: Well healed scar from previous spine surgery detected Alignment:  Scoliosis detected Functional ROM: Pain restricted ROM       Stability: No instability detected Muscle Tone/Strength: Functionally intact. No obvious neuro-muscular anomalies detected. Sensory (Neurological): Dermatomal pain pattern Palpation: Complains of area being tender to palpation       Provocative Tests: Hyperextension/rotation test: deferred today       Lumbar quadrant test (Kemp's test): deferred today       Lateral bending test: deferred today       Patrick's Maneuver: deferred today                   FABER* test: deferred today  S-I anterior distraction/compression test: deferred today         S-I lateral compression test: deferred today         S-I Thigh-thrust test: deferred today         S-I Gaenslen's test: deferred today         *(Flexion, ABduction and External Rotation)  Gait & Posture Assessment  Ambulation: Limited Gait: Antalgic Posture: Difficulty standing up straight, due to pain   Lower Extremity Exam    Side: Right lower extremity  Side: Left lower extremity  Stability: No instability observed          Stability: No instability observed          Skin & Extremity Inspection: Skin color, temperature, and hair growth are WNL. No peripheral edema or cyanosis. No masses, redness, swelling, asymmetry, or associated skin lesions. No contractures.  Skin & Extremity Inspection: Skin color, temperature, and hair growth are WNL. No peripheral edema or cyanosis. No masses, redness, swelling, asymmetry, or associated skin lesions. No contractures.  Functional ROM: Pain restricted ROM                  Functional ROM: Pain restricted ROM                  Muscle Tone/Strength: Functionally intact. No obvious neuro-muscular anomalies detected.  Muscle Tone/Strength: Functionally intact. No obvious neuro-muscular anomalies detected.  Sensory (Neurological): Unimpaired        Sensory (Neurological): Unimpaired        DTR: Patellar: deferred today Achilles:  deferred today Plantar: deferred today  DTR: Patellar: deferred today Achilles: deferred today Plantar: deferred today  Palpation: No palpable anomalies  Palpation: No palpable anomalies   Assessment  Primary Diagnosis & Pertinent Problem List: The primary encounter diagnosis was Autosomal recessive spondylocostal dysostosis. Diagnoses of History of lumbar spinal fusion (T11- iliac), Status post cervical spinal fusion, Infantile idiopathic scoliosis of thoracolumbar region, Cervicalgia, and Chronic pain syndrome were also pertinent to this visit.  Visit Diagnosis (New problems to examiner): 1. Autosomal recessive spondylocostal dysostosis   2. History of lumbar spinal fusion (T11- iliac)   3. Status post cervical spinal fusion   4. Infantile idiopathic scoliosis of thoracolumbar region   5. Cervicalgia   6. Chronic pain syndrome    Plan of Care (Initial workup plan)  Note: Brianna Arnold was reminded that as per protocol, today's visit has been an evaluation only. We have not taken over the patient's controlled substance management.  1.  Urine toxicology screen 2.  Trigger point injection of left trapezius region 3.  Butrans patch as below at 5 mcg an hour 4.  Provided patient resources regarding left axillary peripheral nerve stimulation    Lab Orders     Compliance Drug Analysis, Ur   Procedure Orders     TRIGGER POINT INJECTION   Pharmacotherapy (current): Medications ordered:  Meds ordered this encounter  Medications  . buprenorphine (BUTRANS) 5 MCG/HR PTWK    Sig: Place 1 patch onto the skin every 7 (seven) days.    Dispense:  4 patch    Refill:  0    For chronic pain syndrome   Medications administered during this visit: Naomie Dean. Carnegie had no medications administered during this visit.   Pharmacological management options:  Opioid Analgesics: The patient was informed that there is no guarantee that she would be a candidate for opioid analgesics. The decision  will be made following CDC guidelines. This decision will  be based on the results of diagnostic studies, as well as Ms. Ives's risk profile.   Membrane stabilizer: Continue gabapentin as prescribed  Muscle relaxant: Continue Flexeril as prescribed  NSAID: To be determined at a later time  Other analgesic(s): To be determined at a later time   Interventional management options: Ms. Barnier was informed that there is no guarantee that she would be a candidate for interventional therapies. The decision will be based on the results of diagnostic studies, as well as Ms. Melamed's risk profile.  Procedure(s) under consideration:  Left axillary peripheral nerve stimulation   Provider-requested follow-up: Return in about 18 days (around 11/02/2019) for left trapezius TPI + MM.  No future appointments.  Note by: Gillis Santa, MD Date: 10/15/2019; Time: 11:51 AM

## 2019-10-15 NOTE — Telephone Encounter (Signed)
PA sent to [employer] via [facility] for butrans patches. Voicemail left with patient to let her know I have sent.

## 2019-10-19 ENCOUNTER — Telehealth: Payer: Self-pay | Admitting: Student in an Organized Health Care Education/Training Program

## 2019-10-19 DIAGNOSIS — Z981 Arthrodesis status: Secondary | ICD-10-CM

## 2019-10-19 DIAGNOSIS — G894 Chronic pain syndrome: Secondary | ICD-10-CM

## 2019-10-19 DIAGNOSIS — M4105 Infantile idiopathic scoliosis, thoracolumbar region: Secondary | ICD-10-CM

## 2019-10-19 DIAGNOSIS — Q7649 Other congenital malformations of spine, not associated with scoliosis: Secondary | ICD-10-CM

## 2019-10-19 DIAGNOSIS — Q766 Other congenital malformations of ribs: Secondary | ICD-10-CM

## 2019-10-19 NOTE — Telephone Encounter (Signed)
Patient called stating Brianna Arnold does not have pain patches, they are on back order for a month  but she can get [facility] [address]. [place] does have them. Would like to get script sent there.

## 2019-10-20 MED ORDER — BUPRENORPHINE 5 MCG/HR TD PTWK: 1.0000 | MEDICATED_PATCH | TRANSDERMAL | 0 refills | Status: DC

## 2019-10-20 NOTE — Telephone Encounter (Signed)
Patient called pharmacy and they told her there has to be a prior authorization before they will fill the script.

## 2019-10-20 NOTE — Telephone Encounter (Signed)
Called patient to inform this had been done. No answer. LVM.

## 2019-10-21 ENCOUNTER — Telehealth: Payer: Self-pay | Admitting: Student in an Organized Health Care Education/Training Program

## 2019-10-21 NOTE — Telephone Encounter (Signed)
Spoke with patient's mom to let her know the status of PA for patches.  I have sent an appeal via [facility] and it states it could take 3 - 7 days for review.  I did tell her that if we heard anything I would let her know. She will call [facility] and let them know what is going on.

## 2019-10-21 NOTE — Telephone Encounter (Signed)
Pt's mother called and stated that the pain patch Dr Cherylann Ratel ordered is on back order at [facility] so they [facility] [address] but [facility] because [facility] need the PA number.

## 2019-10-22 LAB — COMPLIANCE DRUG ANALYSIS, UR

## 2019-11-04 ENCOUNTER — Telehealth: Payer: Self-pay | Admitting: *Deleted

## 2019-11-04 ENCOUNTER — Ambulatory Visit
Payer: Medicare HMO | Attending: Student in an Organized Health Care Education/Training Program | Admitting: Student in an Organized Health Care Education/Training Program

## 2019-11-04 ENCOUNTER — Other Ambulatory Visit: Payer: Self-pay

## 2019-11-04 ENCOUNTER — Encounter: Payer: Self-pay | Admitting: Student in an Organized Health Care Education/Training Program

## 2019-11-04 VITALS — BP 114/86 | HR 81 | Temp 98.4°F | Resp 16 | Ht <= 58 in | Wt 145.0 lb

## 2019-11-04 DIAGNOSIS — Q766 Other congenital malformations of ribs: Secondary | ICD-10-CM | POA: Diagnosis present

## 2019-11-04 DIAGNOSIS — Q7649 Other congenital malformations of spine, not associated with scoliosis: Secondary | ICD-10-CM | POA: Insufficient documentation

## 2019-11-04 DIAGNOSIS — M4105 Infantile idiopathic scoliosis, thoracolumbar region: Secondary | ICD-10-CM | POA: Insufficient documentation

## 2019-11-04 DIAGNOSIS — M542 Cervicalgia: Secondary | ICD-10-CM | POA: Insufficient documentation

## 2019-11-04 DIAGNOSIS — Z981 Arthrodesis status: Secondary | ICD-10-CM | POA: Diagnosis present

## 2019-11-04 DIAGNOSIS — G894 Chronic pain syndrome: Secondary | ICD-10-CM

## 2019-11-04 MED ORDER — ROPIVACAINE HCL 2 MG/ML IJ SOLN
9.0000 mL | Freq: Once | INTRAMUSCULAR | Status: AC
  Administered 2019-11-04: 9 mL via PERINEURAL

## 2019-11-04 MED ORDER — ROPIVACAINE HCL 2 MG/ML IJ SOLN
INTRAMUSCULAR | Status: AC
  Filled 2019-11-04: qty 10

## 2019-11-04 MED ORDER — BUPRENORPHINE 7.5 MCG/HR TD PTWK: 1.0000 | MEDICATED_PATCH | TRANSDERMAL | 0 refills | Status: DC

## 2019-11-04 NOTE — Progress Notes (Signed)
PROVIDER NOTE: Information contained herein reflects review and annotations entered in association with encounter. Interpretation of such information and data should be left to medically-trained personnel. Information provided to patient can be located elsewhere in the medical record under "Patient Instructions". Document created using STT-dictation technology, any transcriptional errors that may result from process are unintentional.    Patient: Brianna Arnold  Service Category: Procedure  Provider: Edward Jolly, MD  DOB: 1990/12/18  DOS: 11/04/2019  Location: [facility]  MRN: [id number]  Setting: Ambulatory - outpatient  Referring Provider: Edward Jolly, MD  Type: Established Patient  Specialty: Interventional Pain Management  PCP: Marguarite Arbour, MD   Primary Reason for Visit: Interventional Pain Management Treatment. CC: Procedure  Procedure:          Anesthesia, Analgesia, Anxiolysis:  Type: Trigger Point Injection (1-2 muscle groups) CPT: 20552 Primary Purpose: Diagnostic  Cervical myofascial pain syndrome, left trapezius trigger point injection   Type: Local Anesthesia Indication(s): Analgesia         Local Anesthetic: Lidocaine 1-2% Route: Infiltration (Kelley/IM) IV Access: Declined Sedation: Declined   Position: Sitting   Indications: 1. Chronic pain syndrome   2. Autosomal recessive spondylocostal dysostosis   3. History of lumbar spinal fusion (T11- iliac)   4. Status post cervical spinal fusion   5. Cervicalgia   6. Infantile idiopathic scoliosis of thoracolumbar region    Pain Score: Pre-procedure: 5 /10 Post-procedure: 5 /10   Pre-op Assessment:  Brianna Arnold is a 29 y.o. (year old), female patient, seen today for interventional treatment. She  has a past surgical history that includes Cervical spine surgery (07/2014); Back surgery (10/2014); and Anterior cervical decomp/discectomy fusion (N/A, 02/11/2019). Brianna Arnold has a current  medication list which includes the following prescription(s): albuterol, vitamin d3, b-12 compliance injection, cyclobenzaprine, famotidine, ferrous sulfate, gabapentin, promethazine, sprintec 28, tramadol, trazodone, vitamin b-12, and [START ON 11/12/2019] buprenorphine. Her primarily concern today is the Procedure  Initial Vital Signs:  Pulse/HCG Rate: 78  Temp: 97.7 F (36.5 C) Resp: 18 BP: 109/86 SpO2: 100 %  BMI: Estimated body mass index is 33.7 kg/m as calculated from the following:   Height as of this encounter: 4\' 7"  (1.397 m).   Weight as of this encounter: 145 lb (65.8 kg).  Risk Assessment: Allergies: Reviewed. She is allergic to ativan [lorazepam] and tizanidine.  Allergy Precautions: None required Coagulopathies: Reviewed. None identified.  Blood-thinner therapy: None at this time Active Infection(s): Reviewed. None identified. Brianna Arnold is afebrile  Site Confirmation: Brianna Arnold was asked to confirm the procedure and laterality before marking the site Procedure checklist: Completed Consent: Before the procedure and under the influence of no sedative(s), amnesic(s), or anxiolytics, the patient was informed of the treatment options, risks and possible complications. To fulfill our ethical and legal obligations, as recommended by the American Medical Association's Code of Ethics, I have informed the patient of my clinical impression; the nature and purpose of the treatment or procedure; the risks, benefits, and possible complications of the intervention; the alternatives, including doing nothing; the risk(s) and benefit(s) of the alternative treatment(s) or procedure(s); and the risk(s) and benefit(s) of doing nothing. The patient was provided information about the general risks and possible complications associated with the procedure. These may include, but are not limited to: failure to achieve desired goals, infection, bleeding, organ or nerve damage, allergic reactions,  paralysis, and death. In addition, the patient was informed of those risks and complications associated to the procedure, such  as failure to decrease pain; infection; bleeding; organ or nerve damage with subsequent damage to sensory, motor, and/or autonomic systems, resulting in permanent pain, numbness, and/or weakness of one or several areas of the body; allergic reactions; (i.e.: anaphylactic reaction); and/or death. Furthermore, the patient was informed of those risks and complications associated with the medications. These include, but are not limited to: allergic reactions (i.e.: anaphylactic or anaphylactoid reaction(s)); adrenal axis suppression; blood sugar elevation that in diabetics may result in ketoacidosis or comma; water retention that in patients with history of congestive heart failure may result in shortness of breath, pulmonary edema, and decompensation with resultant heart failure; weight gain; swelling or edema; medication-induced neural toxicity; particulate matter embolism and blood vessel occlusion with resultant organ, and/or nervous system infarction; and/or aseptic necrosis of one or more joints. Finally, the patient was informed that Medicine is not an exact science; therefore, there is also the possibility of unforeseen or unpredictable risks and/or possible complications that may result in a catastrophic outcome. The patient indicated having understood very clearly. We have given the patient no guarantees and we have made no promises. Enough time was given to the patient to ask questions, all of which were answered to the patient's satisfaction. Brianna Arnold has indicated that she wanted to continue with the procedure. Attestation: I, the ordering provider, attest that I have discussed with the patient the benefits, risks, side-effects, alternatives, likelihood of achieving goals, and potential problems during recovery for the procedure that I have provided informed consent. Date   Time: 11/04/2019  9:48 AM  Pre-Procedure Preparation:  Monitoring: As per clinic protocol. Respiration, ETCO2, SpO2, BP, heart rate and rhythm monitor placed and checked for adequate function Safety Precautions: Patient was assessed for positional comfort and pressure points before starting the procedure. Time-out: I initiated and conducted the "Time-out" before starting the procedure, as per protocol. The patient was asked to participate by confirming the accuracy of the "Time Out" information. Verification of the correct person, site, and procedure were performed and confirmed by me, the nursing staff, and the patient. "Time-out" conducted as per Joint Commission's Universal Protocol (UP.01.01.01). Time: 1030  Description of Procedure:          Area Prepped: Entire Left cervical and trapezius region Region DuraPrep (Iodine Povacrylex [0.7% available iodine] and Isopropyl Alcohol, 74% w/w) Safety Precautions: Aspiration looking for blood return was conducted prior to all injections. At no point did we inject any substances, as a needle was being advanced. No attempts were made at seeking any paresthesias. Safe injection practices and needle disposal techniques used. Medications properly checked for expiration dates. SDV (single dose vial) medications used. Description of the Procedure: Protocol guidelines were followed. The patient was placed in position over the fluoroscopy table. The target area was identified and the area prepped in the usual manner. Skin & deeper tissues infiltrated with local anesthetic. Appropriate amount of time allowed to pass for local anesthetics to take effect. The procedure needles were then advanced to the target area. Proper needle placement secured. Negative aspiration confirmed. Solution injected in intermittent fashion, asking for systemic symptoms every 0.5cc of injectate. The needles were then removed and the area cleansed, making sure to leave some of the prepping  solution back to take advantage of its long term bactericidal properties.  Vitals:   11/04/19 0956 11/04/19 1036  BP: 109/86 114/86  Pulse: 78 81  Resp: 18 16  Temp: 97.7 F (36.5 C) 98.4 F (36.9 C)  SpO2: 100% 100%  Weight: 145 lb (65.8 kg)   Height: 4\' 7"  (1.397 m)     Start Time: 1031 hrs. End Time: 1035 hrs. Materials:  Needle(s) Type: Regular needle Gauge: 25G Length: 1.5-in Medication(s): Please see orders for medications and dosing details. Approximately 10 trigger points injected in the left trapezius and cervical paraspinal region.  Dry needling performed. Imaging Guidance:          Type of Imaging Technique: None used Indication(s): N/A Exposure Time: No patient exposure Contrast: None used. Fluoroscopic Guidance: N/A Ultrasound Guidance: N/A Interpretation: N/A  Antibiotic Prophylaxis:   Anti-infectives (From admission, onward)   None     Indication(s): None identified  Post-operative Assessment:  Post-procedure Vital Signs:  Pulse/HCG Rate: 81  Temp: 98.4 F (36.9 C) Resp: 16 BP: 114/86 SpO2: 100 %  EBL: None  Complications: No immediate post-treatment complications observed by team, or reported by patient.  Note: The patient tolerated the entire procedure well. A repeat set of vitals were taken after the procedure and the patient was kept under observation following institutional policy, for this type of procedure. Post-procedural neurological assessment was performed, showing return to baseline, prior to discharge. The patient was provided with post-procedure discharge instructions, including a section on how to identify potential problems. Should any problems arise concerning this procedure, the patient was given instructions to immediately contact us, at any time, without hesitation. In any case, we plan to contact the patient by telephone for a follow-up status report regarding this interventional procedure.  Comments:  No additional relevant  information.  Plan of Care  Patient is finding benefit with Butrans patch at 5 mcg an hour, rates it as approximately 30 to 35%.  We discussed increasing patch to 7.5 mcg an hour to optimize pain relief.  Risks and benefits reviewed and patient would like to try the higher dose.  New prescription below for Butrans patch at 7.5 mcg an hour.  We also discussed Sprint axillary nerve peripheral nerve stimulation for her shoulder pain.  She will think about this further.  Follow-up in approximately 5 weeks for postprocedural evaluation, medication management and to discuss/schedule Sprint PNS procedure.   Medications ordered for procedure: Meds ordered this encounter  Medications  . ropivacaine (PF) 2 mg/mL (0.2%) (NAROPIN) injection 9 mL  . buprenorphine (BUTRANS) 7.5 MCG/HR    Sig: Place 1 patch onto the skin every 7 (seven) days.    Dispense:  4 patch    Refill:  0    Do not place this medication, or any other prescription from our practice, on "Automatic Refill". Patient may have prescription filled one day early if pharmacy is closed on scheduled refill date.     Medications administered: We administered ropivacaine (PF) 2 mg/mL (0.2%).  See the medical record for exact dosing, route, and time of administration.  Follow-up plan:   Return in about 5 weeks (around 12/09/2019) for Medication Management, Post Procedure Evaluation, in person.      Pharmacological management options:  Opioid Analgesics: Butrans patch, increase to 7.5 mcg/hr  Membrane stabilizer: Continue gabapentin as prescribed  Muscle relaxant: Continue Flexeril as prescribed  NSAID: To be determined at a later time  Other analgesic(s): To be determined at a later time   Interventional management options: Brianna Arnold was informed that there is no guarantee that she would be a candidate for interventional therapies. The decision will be based on the results of diagnostic studies, as well as Brianna Arnold's risk profile.   Procedure(s) under consideration:  S/p left TPI 11/04/19 Left axillary peripheral nerve stimulation      Recent Visits Date Type Provider Dept  10/15/19 Office Visit Edward Jolly, MD [facility]  Showing recent visits within past 90 days and meeting all other requirements Today's Visits Date Type Provider Dept  11/04/19 Procedure visit Edward Jolly, MD [facility]  Showing today's visits and meeting all other requirements Future Appointments Date Type Provider Dept  12/03/19 Appointment Edward Jolly, MD [facility]  Showing future appointments within next 90 days and meeting all other requirements  Disposition: Discharge home  Discharge (Date  Time): 11/04/2019; 1046 hrs.   Primary Care Physician: Marguarite Arbour, MD Location: [facility] Note by: Edward Jolly, MD Date: 11/04/2019; Time: 11:16 AM  Disclaimer:  Medicine is not an exact science. The only guarantee in medicine is that nothing is guaranteed. It is important to note that the decision to proceed with this intervention was based on the information collected from the patient. The Data and conclusions were drawn from the patient's questionnaire, the interview, and the physical examination. Because the information was provided in large part by the patient, it cannot be guaranteed that it has not been purposely or unconsciously manipulated. Every effort has been made to obtain as much relevant data as possible for this evaluation. It is important to note that the conclusions that lead to this procedure are derived in large part from the available data. Always take into account that the treatment will also be dependent on availability of resources and existing treatment guidelines, considered by other Pain Management Practitioners as being common knowledge and practice, at the time of the intervention. For Medico-Legal purposes, it is also important to point out that  variation in procedural techniques and pharmacological choices are the acceptable norm. The indications, contraindications, technique, and results of the above procedure should only be interpreted and judged by a Board-Certified Interventional Pain Specialist with extensive familiarity and expertise in the same exact procedure and technique.

## 2019-11-04 NOTE — Telephone Encounter (Signed)
Pharmacy called to check on Rx that was sent in today for Butrans patches 7.5 mcg asking when to fill.  Upon review of the Rx it has a start date of 11/12/19, pharmacy states that was not noted on the Rx and they will make a note on it DNF until 11/12/19.

## 2019-11-05 ENCOUNTER — Telehealth: Payer: Self-pay | Admitting: *Deleted

## 2019-11-05 NOTE — Telephone Encounter (Signed)
Attempted to call for post procedure follow-up. Message left. 

## 2019-11-12 ENCOUNTER — Telehealth: Payer: Self-pay | Admitting: Student in an Organized Health Care Education/Training Program

## 2019-11-12 NOTE — Telephone Encounter (Signed)
Patient advised to apply the patch to her arm.

## 2019-11-12 NOTE — Telephone Encounter (Signed)
Patient called stating she tried to put pain patch on chest as Dr. Cherylann Ratel suggested, she broke out when she did this. So far she can use on her arms but not chest.

## 2019-12-03 ENCOUNTER — Other Ambulatory Visit: Payer: Self-pay

## 2019-12-03 ENCOUNTER — Ambulatory Visit
Payer: Medicare HMO | Attending: Student in an Organized Health Care Education/Training Program | Admitting: Student in an Organized Health Care Education/Training Program

## 2019-12-03 ENCOUNTER — Encounter: Payer: Self-pay | Admitting: Student in an Organized Health Care Education/Training Program

## 2019-12-03 DIAGNOSIS — M542 Cervicalgia: Secondary | ICD-10-CM | POA: Diagnosis not present

## 2019-12-03 DIAGNOSIS — M25512 Pain in left shoulder: Secondary | ICD-10-CM

## 2019-12-03 DIAGNOSIS — Z981 Arthrodesis status: Secondary | ICD-10-CM

## 2019-12-03 DIAGNOSIS — Q766 Other congenital malformations of ribs: Secondary | ICD-10-CM

## 2019-12-03 DIAGNOSIS — G8929 Other chronic pain: Secondary | ICD-10-CM

## 2019-12-03 DIAGNOSIS — M67912 Unspecified disorder of synovium and tendon, left shoulder: Secondary | ICD-10-CM | POA: Diagnosis not present

## 2019-12-03 DIAGNOSIS — G894 Chronic pain syndrome: Secondary | ICD-10-CM

## 2019-12-03 DIAGNOSIS — Q7649 Other congenital malformations of spine, not associated with scoliosis: Secondary | ICD-10-CM

## 2019-12-03 MED ORDER — BUPRENORPHINE 5 MCG/HR TD PTWK: 1.0000 | MEDICATED_PATCH | TRANSDERMAL | 1 refills | Status: AC

## 2019-12-03 NOTE — Progress Notes (Signed)
Patient: Brianna Arnold  Service Category: E/M  Provider: Gillis Santa, MD  DOB: Jul 09, 1990  DOS: 12/03/2019  Location: Office  MRN: [id number]  Setting: Ambulatory outpatient  Referring Provider: Idelle Crouch, MD  Type: Established Patient  Specialty: Interventional Pain Management  PCP: Idelle Crouch, MD  Location: Home  Delivery: TeleHealth     Virtual Encounter - Pain Management PROVIDER NOTE: Information contained herein reflects review and annotations entered in association with encounter. Interpretation of such information and data should be left to medically-trained personnel. Information provided to patient can be located elsewhere in the medical record under "Patient Instructions". Document created using STT-dictation technology, any transcriptional errors that may result from process are unintentional.    Contact & Pharmacy Preferred: [phone] Home: [phone] (home) Mobile: There is no such number on file (mobile). E-mail: [email or web address]  [place], [place] [address] Phone: [phone] Fax: [phone]  [id number] [place], [place] - [address] [place] [address] Phone: [phone] Fax: [phone]   Pre-screening  [name] offered "in-person" vs "virtual" encounter. She indicated preferring virtual for this encounter.   Reason COVID-19*  Social distancing based on CDC and AMA recommendations.   I contacted [facility] Veldman Pritts on 12/03/2019 via video conference.      I clearly identified myself as Gillis Santa, MD. I verified that I was speaking with the correct person using two identifiers (Name: Brianna Arnold, and date of birth: 08/04/1990).  Consent I sought verbal advanced consent from [place] for virtual visit interactions. I  informed [name] of possible security and privacy concerns, risks, and limitations associated with providing "not-in-person" medical evaluation and management services. I also informed [name] of the availability of "in-person" appointments. Finally, I informed her that there would be a charge for the virtual visit and that she could be  personally, fully or partially, financially responsible for it. Ms. Arp expressed understanding and agreed to proceed.   Historic Elements   Brianna Arnold is a 29 y.o. year old, female patient evaluated today after her last contact with our practice on 11/12/2019. Brianna Arnold  has a past medical history of Anemia and Jarcho-Levin syndrome. She also  has a past surgical history that includes Cervical spine surgery (07/2014); Back surgery (10/2014); and Anterior cervical decomp/discectomy fusion (N/A, 02/11/2019). [name] has a current medication list which includes the following prescription(s): albuterol, vitamin d3, b-12 compliance injection, cyclobenzaprine, famotidine, ferrous sulfate, gabapentin, promethazine, sprintec 28, tramadol, trazodone, vitamin b-12, and buprenorphine. She  reports that she has never smoked. She has never used smokeless tobacco. She reports that she does not use drugs. No history on file for alcohol use. [name] is allergic to ativan [lorazepam] and tizanidine.   HPI  Today, she is being contacted for medication management. and postprocedural evaluation  Today we are conducting a virtual visit as the patient is diagnosed with Covid.  She has congestion but no other systemic symptoms.  At her last visit, her Butrans patch was increased from 5 mcg to 7.5 mcg.  She states that the increase in dose is not effective and she is endorsing side effects of irritability, fatigue, nausea.  She found 5 mcg patch to be superior in analgesic benefit with less side effects so we will reduce dose back to 5 mcg.  Patient had 5 days of  pain relief after trigger point injection that we did at last visit.  This was a left cervical/trapezius TPI.  For her persistent left shoulder pain, we discussed axillary nerve PNS.  Treatment was discussed with him in detail using images of lead as well as IPG.  Pharmacotherapy Assessment   11/18/2019  1   11/04/2019  Buprenorphine 7.5 Mcg/Hr Patch  4.00  28 Bi Lat   [id number]   [facility] (7587)   0/0  0.18 mg  Medicare   [place]     Analgesic: Reduce Butrans back to 5 mcg an hour  Monitoring: [place] PMP: PDMP reviewed during this encounter.       Pharmacotherapy: No side-effects or adverse reactions reported. Compliance: No problems identified. Effectiveness: Clinically acceptable. Plan: Refer to "POC".  UDS:  Summary  Date Value Ref Range Status  10/15/2019 Note  Final    Comment:    ==================================================================== Compliance Drug Analysis, Ur ==================================================================== Test                             Result       Flag       Units  Drug Absent but Declared for Prescription Verification   Buprenorphine                  Not Detected UNEXPECTED ng/mg creat    Transdermal buprenorphine, as indicated in the declared medication    list, is not always detected even when used as directed.    Tramadol                       Not Detected UNEXPECTED ng/mg creat   Gabapentin                     Not Detected UNEXPECTED   Cyclobenzaprine                Not Detected UNEXPECTED   Trazodone                      Not Detected UNEXPECTED   Promethazine                   Not Detected UNEXPECTED ==================================================================== Test                      Result    Flag   Units      Ref Range   Creatinine              91               mg/dL      >=20 ==================================================================== Declared Medications:  The flagging and interpretation on this report are based  on the  following declared medications.  Unexpected results may arise from  inaccuracies in the declared medications.   **Note: The testing scope of this panel includes these medications:   Cyclobenzaprine (Flexeril)  Gabapentin (Neurontin)  Promethazine (Phenergan)  Tramadol (Ultram)  Trazodone (Desyrel)   **Note: The testing scope of this panel does not include small to  moderate amounts of these reported medications:   Buprenorphine Patch (BuTrans)   **Note: The testing scope of this panel does not include the  following reported medications:   Albuterol (Ventolin HFA)  Ethinyl Estradiol (Sprintec)  Famotidine (Pepcid)  Iron  Norgestimate (Sprintec)  Vitamin B12  Vitamin D3 ====================================================================  For clinical consultation, please call [phone]. ====================================================================     Laboratory Chemistry Profile   Renal Lab Results  Component Value Date   BUN 6 02/15/2019   CREATININE 0.63 02/15/2019   GFRAA >60 02/15/2019   GFRNONAA >60 02/15/2019     Hepatic Lab Results  Component Value Date   AST 17 02/14/2019   ALT 12 02/14/2019   ALBUMIN 3.9 02/14/2019   ALKPHOS 55 02/14/2019   LIPASE 18 02/14/2019     Electrolytes Lab Results  Component Value Date   NA 137 02/15/2019   K 4.1 02/15/2019   CL 106 02/15/2019   CALCIUM 8.1 (L) 02/15/2019     Bone No results found for: VD25OH, [id number], [id number], [id number], 25OHVITD1, 25OHVITD2, 25OHVITD3, TESTOFREE, TESTOSTERONE   Inflammation (CRP: Acute Phase) (ESR: Chronic Phase) Lab Results  Component Value Date   LATICACIDVEN 1.6 04/19/2015       Note: Above Lab results reviewed.   Imaging  DG ABD ACUTE 2+V W 1V CHEST CLINICAL DATA:  Abdominal pain and constipation. Jarcho-Levin syndrome  EXAM: DG ABDOMEN ACUTE W/ 1V CHEST  COMPARISON:  Chest radiograph April 19, 2015  FINDINGS: Frontal chest: There  is atelectatic change in the left base with small left pleural effusion. Lungs elsewhere are clear. The heart size and pulmonary vascular normal. There are multiple rib defects, stable. Postoperative changes noted in the cervical, lower thoracic, and lumbar spine regions.  Supine and upright abdomen: There is diffuse stool throughout colon. There is no bowel dilatation or air-fluid level to suggest bowel obstruction. No free air.  IMPRESSION: Diffuse stool throughout colon consistent with constipation. No bowel obstruction or free air.  Atelectasis left base with small left pleural effusion. Lungs elsewhere clear.  Extensive postoperative changes in the spine. 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  Electronically Signed   By: Lowella Grip III M.D.   On: 02/14/2019 11:21  Assessment  The primary encounter diagnosis was Chronic left shoulder pain. Diagnoses of Disorder of left rotator cuff, Cervicalgia, Status post cervical spinal fusion, Chronic pain syndrome, and Autosomal recessive spondylocostal dysostosis were also pertinent to this visit.  Plan of Care   Ms. Mette Southgate Castorena has a current medication list which includes the following long-term medication(s): albuterol, famotidine, ferrous sulfate, gabapentin, promethazine, sprintec 28, and trazodone.  Chronic left shoulder pain, rotator cuff arthropathy: Discussed treatment options with patient.  She is not interested in a steroid therapy nor do I think this would provide her with long-term pain relief.  We discussed axillary peripheral nerve stimulation and what treatment entails.  Patient would like to consider this treatment modality.  We will get her scheduled for Sprint peripheral nerve stimulation of axillary nerve under ultrasound guidance.  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  Continue multimodal analgesics with Flexeril and gabapentin as prescribed.  No refills needed at this time.  Pharmacotherapy (Medications Ordered): Meds ordered this encounter  Medications  . buprenorphine (BUTRANS) 5 MCG/HR PTWK    Sig: Place 1 patch onto the skin every 7 (seven) days.    Dispense:  4 patch    Refill:  1    For chronic pain syndrome   Orders:  Orders Placed This Encounter  Procedures  . Peripheral Nerve Stimulation    Standing Status:   Future    Standing Expiration Date:   06/04/2020    Scheduling Instructions:  Left axillary nerve PNS    Order Specific Question:   Where will this procedure be performed?    Answer:   [facility]   Follow-up plan:   Return in about 2 weeks (around 12/17/2019) for Left axillary PNS (p.o. Valium) 40 mins.      Pharmacological management options:  Opioid Analgesics: Butrans patch, side effects at 7.5 mcg an hour, reduce back to 5  Membrane stabilizer: Continue gabapentin as prescribed  Muscle relaxant: Continue Flexeril as prescribed  NSAID: To be determined at a later time  Other analgesic(s): To be determined at a later time   Interventional management options: Ms. Barlowe was informed that there is no guarantee that she would be a candidate for interventional therapies. The decision will be based on the results of diagnostic studies, as well as Ms. Weissinger's risk profile.  Procedure(s) under consideration:  S/p left TPI 11/04/19 75% pain relief for 5 days. Left axillary peripheral nerve stimulation: Proceed       Recent Visits Date Type Provider Dept  11/04/19 Procedure visit Gillis Santa, MD [facility]  10/15/19 Office Visit Gillis Santa, MD [facility]  Showing recent visits within past 90 days and meeting all other requirements Today's Visits Date Type Provider Dept  12/03/19 Telemedicine Gillis Santa, MD [facility]  Showing today's visits and meeting all other  requirements Future Appointments No visits were found meeting these conditions. Showing future appointments within next 90 days and meeting all other requirements  I discussed the assessment and treatment plan with the patient. The patient was provided an opportunity to ask questions and all were answered. The patient agreed with the plan and demonstrated an understanding of the instructions.  Patient advised to call back or seek an in-person evaluation if the symptoms or condition worsens.  Duration of encounter:30 minutes.  Note by: Gillis Santa, MD Date: 12/03/2019; Time: 9:56 AM

## 2019-12-03 NOTE — Progress Notes (Signed)
Safety precautions to be maintained throughout the outpatient stay will include: orient to surroundings, keep bed in low position, maintain call bell within reach at all times, provide assistance with transfer out of bed and ambulation.  

## 2019-12-30 ENCOUNTER — Other Ambulatory Visit: Payer: Self-pay

## 2019-12-30 ENCOUNTER — Encounter: Payer: Self-pay | Admitting: Student in an Organized Health Care Education/Training Program

## 2019-12-30 ENCOUNTER — Ambulatory Visit
Payer: Medicare HMO | Attending: Student in an Organized Health Care Education/Training Program | Admitting: Student in an Organized Health Care Education/Training Program

## 2019-12-30 DIAGNOSIS — M67814 Other specified disorders of tendon, left shoulder: Secondary | ICD-10-CM

## 2019-12-30 DIAGNOSIS — G8929 Other chronic pain: Secondary | ICD-10-CM

## 2019-12-30 DIAGNOSIS — G894 Chronic pain syndrome: Secondary | ICD-10-CM | POA: Insufficient documentation

## 2019-12-30 DIAGNOSIS — M25512 Pain in left shoulder: Secondary | ICD-10-CM | POA: Insufficient documentation

## 2019-12-30 DIAGNOSIS — M67912 Unspecified disorder of synovium and tendon, left shoulder: Secondary | ICD-10-CM | POA: Diagnosis not present

## 2019-12-30 MED ORDER — LIDOCAINE HCL 2 % IJ SOLN
INTRAMUSCULAR | Status: AC
  Filled 2019-12-30: qty 20

## 2019-12-30 MED ORDER — DIAZEPAM 5 MG PO TABS
ORAL_TABLET | ORAL | Status: AC
  Filled 2019-12-30: qty 1

## 2019-12-30 MED ORDER — ROPIVACAINE HCL 2 MG/ML IJ SOLN
2.0000 mL | Freq: Once | INTRAMUSCULAR | Status: AC
  Administered 2019-12-30: 5 mL via EPIDURAL

## 2019-12-30 MED ORDER — LIDOCAINE HCL 2 % IJ SOLN
20.0000 mL | Freq: Once | INTRAMUSCULAR | Status: AC
  Administered 2019-12-30: 400 mg

## 2019-12-30 MED ORDER — DIAZEPAM 5 MG PO TABS
5.0000 mg | ORAL_TABLET | Freq: Once | ORAL | Status: AC
  Administered 2019-12-30: 5 mg via ORAL

## 2019-12-30 MED ORDER — ROPIVACAINE HCL 2 MG/ML IJ SOLN
INTRAMUSCULAR | Status: AC
  Filled 2019-12-30: qty 10

## 2019-12-30 NOTE — Patient Instructions (Signed)

## 2019-12-30 NOTE — Progress Notes (Signed)
Nursing Pain Medication Assessment:  Safety precautions to be maintained throughout the outpatient stay will include: orient to surroundings, keep bed in low position, maintain call bell within reach at all times, provide assistance with transfer out of bed and ambulation.  Medication Inspection Compliance: Brianna Arnold did not comply with our request to bring her pills to be counted. She was reminded that bringing the medication bottles, even when empty, is a requirement.  Medication: None brought in. Pill/Patch Count: None available to be counted. Bottle Appearance: No container available. Did not bring bottle(s) to appointment. Filled Date: N/A Last Medication intake:  Today

## 2019-12-30 NOTE — Progress Notes (Signed)
PROVIDER NOTE: Information contained herein reflects review and annotations entered in association with encounter. Interpretation of such information and data should be left to medically-trained personnel. Information provided to patient can be located elsewhere in the medical record under "Patient Instructions". Document created using STT-dictation technology, any transcriptional errors that may result from process are unintentional.    Patient: Brianna Arnold  Service Category: Procedure  Provider: Edward Jolly, MD  DOB: 1990/08/13  DOS: 12/30/2019  Location: [facility]  MRN: [id number]  Setting: Ambulatory - outpatient  Referring Provider: Edward Jolly, MD  Type: Established Patient  Specialty: Interventional Pain Management  PCP: Marguarite Arbour, MD   Primary Reason for Visit: Interventional Pain Management Treatment. CC: Shoulder Pain (left)  Procedure:          Anesthesia, Analgesia, Anxiolysis:  SPRINT Axillary Nerve Peripheral Nerve Stimulation for left shoulder pain  Type: Local Anesthesia with 5 mg PO Valium  Local Anesthetic: Lidocaine 1-2%  Position: Sitting   Indications: 1. Chronic left shoulder pain   2. Disorder of left rotator cuff   3. Chronic pain syndrome    Pain Score: Pre-procedure: 6 /10 Post-procedure: 5 /10   Pre-op Assessment:  Brianna Arnold is a 29 y.o. (year old), female patient, seen today for interventional treatment. She  has a past surgical history that includes Cervical spine surgery (07/2014); Back surgery (10/2014); and Anterior cervical decomp/discectomy fusion (N/A, 02/11/2019). Brianna Arnold has a current medication list which includes the following prescription(s): albuterol, buprenorphine, vitamin d3, b-12 compliance injection, cyclobenzaprine, famotidine, ferrous sulfate, gabapentin, promethazine, sprintec 28, tramadol, trazodone, and vitamin b-12, and the following Facility-Administered Medications: diazepam, lidocaine, and  ropivacaine (pf) 2 mg/ml (0.2%). Her primarily concern today is the Shoulder Pain (left)  Initial Vital Signs:  Pulse/HCG Rate: 90ECG Heart Rate: 93 Temp: 97.7 F (36.5 C) Resp: 16 BP: 120/87 SpO2: 100 %  BMI: Estimated body mass index is 33.93 kg/m as calculated from the following:   Height as of this encounter: 4\' 7"  (1.397 m).   Weight as of this encounter: 146 lb (66.2 kg).  Risk Assessment: Allergies: Reviewed. She is allergic to ativan [lorazepam] and tizanidine.  Allergy Precautions: None required Coagulopathies: Reviewed. None identified.  Blood-thinner therapy: None at this time Active Infection(s): Reviewed. None identified. Brianna Arnold is afebrile  Site Confirmation: Brianna Arnold was asked to confirm the procedure and laterality before marking the site Procedure checklist: Completed Consent: Before the procedure and under the influence of no sedative(s), amnesic(s), or anxiolytics, the patient was informed of the treatment options, risks and possible complications. To fulfill our ethical and legal obligations, as recommended by the American Medical Association's Code of Ethics, I have informed the patient of my clinical impression; the nature and purpose of the treatment or procedure; the risks, benefits, and possible complications of the intervention; the alternatives, including doing nothing; the risk(s) and benefit(s) of the alternative treatment(s) or procedure(s); and the risk(s) and benefit(s) of doing nothing. The patient was provided information about the general risks and possible complications associated with the procedure. These may include, but are not limited to: failure to achieve desired goals, infection, bleeding, organ or nerve damage, allergic reactions, paralysis, and death. In addition, the patient was informed of those risks and complications associated to the procedure, such as failure to decrease pain; infection; bleeding; organ or nerve damage with subsequent  damage to sensory, motor, and/or autonomic systems, resulting in permanent pain, numbness, and/or weakness of one or several areas of  the body; allergic reactions; (i.e.: anaphylactic reaction); and/or death. Furthermore, the patient was informed of those risks and complications associated with the medications. These include, but are not limited to: allergic reactions (i.e.: anaphylactic or anaphylactoid reaction(s)); adrenal axis suppression; blood sugar elevation that in diabetics may result in ketoacidosis or comma; water retention that in patients with history of congestive heart failure may result in shortness of breath, pulmonary edema, and decompensation with resultant heart failure; weight gain; swelling or edema; medication-induced neural toxicity; particulate matter embolism and blood vessel occlusion with resultant organ, and/or nervous system infarction; and/or aseptic necrosis of one or more joints. Finally, the patient was informed that Medicine is not an exact science; therefore, there is also the possibility of unforeseen or unpredictable risks and/or possible complications that may result in a catastrophic outcome. The patient indicated having understood very clearly. We have given the patient no guarantees and we have made no promises. Enough time was given to the patient to ask questions, all of which were answered to the patient's satisfaction. Brianna Arnold has indicated that she wanted to continue with the procedure. Attestation: I, the ordering provider, attest that I have discussed with the patient the benefits, risks, side-effects, alternatives, likelihood of achieving goals, and potential problems during recovery for the procedure that I have provided informed consent. Date  Time: 12/30/2019 10:47 AM  Pre-Procedure Preparation:  Monitoring: As per clinic protocol. Respiration, ETCO2, SpO2, BP, heart rate and rhythm monitor placed and checked for adequate function Safety Precautions:  Patient was assessed for positional comfort and pressure points before starting the procedure. Time-out: I initiated and conducted the "Time-out" before starting the procedure, as per protocol. The patient was asked to participate by confirming the accuracy of the "Time Out" information. Verification of the correct person, site, and procedure were performed and confirmed by me, the nursing staff, and the patient. "Time-out" conducted as per Joint Commission's Universal Protocol (UP.01.01.01). Time: 1130  Description of Procedure:          Area Prepped: Entire shoulder Area (left) DuraPrep (Iodine Povacrylex [0.7% available iodine] and Isopropyl Alcohol, 74% w/w)  After the risks, benefits and alternatives were discussed  with the patient and informed consent was obtained, patient  was placed in the sitting position with arm relaxed at side.  Prior to delivery of anesthetics, the painful region was carefully  outlined with a marker. Appropriate skin and bony landmarks  were identified, and pertinent vascular structures were located.   The skin overlying the shoulder area was prepped and  draped in sterile fashion.  Ultrasound and landmark technique was used to identify the deltoid muscle.  An introducer needle and stimulating probe were  assembled, inserted and advanced 6 cm below the acromion within the middle third of the deltoid muscle bounded on the bottom by the insertion of the deltoid muscle into the fold of the axilla.  Insertion was perpendicular to the skin.  Multiple stimulation parameters were used to deliver stimulation to the axillary nerve which was confirmed via ultrasound by observing the deltoid muscle holding tension.  The stimulating probe was removed from the introducer and a percutaneous lead was guided through the needle and delivered to a location in similar proximity to the nerve.   Final location was verified with electrical stimulation and documented with ultrasound.    The introducer needle was removed, and the exposed end of the percutaneous lead was attached to an external stimulator unit.   Various electrical parameter combinations were again  tested until the patient indicated paresthesia or muscle tension  overlapping the distribution of the patient's typical region of pain.   After confirming that lead impedance was in the normal range, the external unit was detached, the needle was removed, and the lead was anchored at the skin.  The lead was threaded into the connector block and electrical continuity and desired patient response was confirmed. The connector block was attached to the external stimulator unit.The patient was observed for stability of vital signs and comfort.            Vitals:   12/30/19 1130 12/30/19 1134 12/30/19 1139 12/30/19 1143  BP: (!) 145/95 (!) 132/95 (!) 130/99 (!) 127/92  Pulse:      Resp: 18 19 14  (!) 24  Temp:      TempSrc:      SpO2: 100% 100% 100% 100%  Weight:      Height:        Start Time: 1130 hrs. End Time: 1143 hrs. Materials:    Imaging Guidance (Non-Spinal):          Type of Imaging Technique: Fluoroscopy Guidance (Non-Spinal) and Ultrasound guidance to confirm deltoid activation Indication(s): Assistance in needle guidance and placement for procedures requiring needle placement in or near specific anatomical locations not easily accessible without such assistance. Exposure Time: Please see nurses notes. Contrast: None used. Fluoroscopic Guidance: I was personally present during the use of fluoroscopy. "Tunnel Vision Technique" used to obtain the best possible view of the target area. Parallax error corrected before commencing the procedure. "Direction-depth-direction" technique used to introduce the needle under continuous pulsed fluoroscopy. Once target was reached, antero-posterior, oblique, and lateral fluoroscopic projection used confirm needle placement in all planes. Images permanently stored  in EMR. Interpretation: No contrast injected. I personally interpreted the imaging intraoperatively. Adequate needle placement confirmed in multiple planes. Permanent images saved into the patient's record.  Antibiotic Prophylaxis:   Anti-infectives (From admission, onward)   None     Indication(s): None identified  Post-operative Assessment:  Post-procedure Vital Signs:  Pulse/HCG Rate: 9098 Temp: 97.7 F (36.5 C) Resp: (!) 24 BP: (!) 127/92 SpO2: 100 %  EBL: None  Complications: No immediate post-treatment complications observed by team, or reported by patient.  Note: The patient tolerated the entire procedure well. A repeat set of vitals were taken after the procedure and the patient was kept under observation following institutional policy, for this type of procedure. Post-procedural neurological assessment was performed, showing return to baseline, prior to discharge. The patient was provided with post-procedure discharge instructions, including a section on how to identify potential problems. Should any problems arise concerning this procedure, the patient was given instructions to immediately contact us, at any time, without hesitation. In any case, we plan to contact the patient by telephone for a follow-up status report regarding this interventional procedure.  Comments:  No additional relevant information.  Plan of Care  Orders:  Orders Placed This Encounter  Procedures  . DG PAIN CLINIC C-ARM 1-60 MIN NO REPORT    Intraoperative interpretation by procedural physician at [facility].    Standing Status:   Standing    Number of Occurrences:   1    Order Specific Question:   Reason for exam:    Answer:   Assistance in needle guidance and placement for procedures requiring needle placement in or near specific anatomical locations not easily accessible without such assistance.    Medications ordered for procedure: Meds ordered this encounter  Medications  .  lidocaine (XYLOCAINE) 2 % (with pres) injection 400 mg  . ropivacaine (PF) 2 mg/mL (0.2%) (NAROPIN) injection 2 mL  . diazepam (VALIUM) tablet 5 mg   Medications administered: Jasmine Pang. Parmar had no medications administered during this visit.  See the medical record for exact dosing, route, and time of administration.  Follow-up plan:   Return for Keep sch. appts.       Pharmacological management options:  Opioid Analgesics: Butrans patch, side effects at 7.5 mcg an hour, reduce back to 5  Membrane stabilizer: Continue gabapentin as prescribed  Muscle relaxant: Continue Flexeril as prescribed  NSAID: To be determined at a later time  Other analgesic(s): To be determined at a later time   Interventional management options: Brianna Arnold was informed that there is no guarantee that she would be a candidate for interventional therapies. The decision will be based on the results of diagnostic studies, as well as Brianna Arnold's risk profile.  Procedure(s) under consideration:  S/p left TPI 11/04/19 75% pain relief for 5 days. Left axillary peripheral nerve stimulation: 12/30/19        Recent Visits Date Type Provider Dept  12/03/19 Telemedicine Edward Jolly, MD [facility]  11/04/19 Procedure visit Edward Jolly, MD [facility]  10/15/19 Office Visit Edward Jolly, MD [facility]  Showing recent visits within past 90 days and meeting all other requirements Today's Visits Date Type Provider Dept  12/30/19 Procedure visit Edward Jolly, MD [facility]  Showing today's visits and meeting all other requirements Future Appointments Date Type Provider Dept  02/02/20 Appointment Edward Jolly, MD [facility]  02/29/20 Appointment Edward Jolly, MD [facility]  Showing future appointments within next 90 days and meeting all other requirements  Disposition: Discharge home  Discharge (Date  Time): 12/30/2019; 1220 hrs.   Primary  Care Physician: Marguarite Arbour, MD Location: [facility] Note by: Edward Jolly, MD Date: 12/30/2019; Time: 12:41 PM  Disclaimer:  Medicine is not an exact science. The only guarantee in medicine is that nothing is guaranteed. It is important to note that the decision to proceed with this intervention was based on the information collected from the patient. The Data and conclusions were drawn from the patient's questionnaire, the interview, and the physical examination. Because the information was provided in large part by the patient, it cannot be guaranteed that it has not been purposely or unconsciously manipulated. Every effort has been made to obtain as much relevant data as possible for this evaluation. It is important to note that the conclusions that lead to this procedure are derived in large part from the available data. Always take into account that the treatment will also be dependent on availability of resources and existing treatment guidelines, considered by other Pain Management Practitioners as being common knowledge and practice, at the time of the intervention. For Medico-Legal purposes, it is also important to point out that variation in procedural techniques and pharmacological choices are the acceptable norm. The indications, contraindications, technique, and results of the above procedure should only be interpreted and judged by a Board-Certified Interventional Pain Specialist with extensive familiarity and expertise in the same exact procedure and technique.

## 2019-12-31 ENCOUNTER — Telehealth: Payer: Self-pay | Admitting: *Deleted

## 2019-12-31 NOTE — Telephone Encounter (Signed)
No problems post procedure. 

## 2020-01-07 ENCOUNTER — Telehealth: Payer: Self-pay | Admitting: Student in an Organized Health Care Education/Training Program

## 2020-01-07 NOTE — Telephone Encounter (Signed)
Patient called to change appt and also states Dr. Cherylann Ratel was supposed to send in script for pain patches on her last visit. This has not been done yet and she only has 1 patch left. Please ask dr Cherylann Ratel to send script I sched patient for 01-21-20

## 2020-01-07 NOTE — Telephone Encounter (Signed)
Patient advised there is a refill on her previous script.

## 2020-01-21 ENCOUNTER — Other Ambulatory Visit: Payer: Self-pay

## 2020-01-21 ENCOUNTER — Encounter: Payer: Self-pay | Admitting: Student in an Organized Health Care Education/Training Program

## 2020-01-21 ENCOUNTER — Ambulatory Visit
Payer: Medicare HMO | Attending: Student in an Organized Health Care Education/Training Program | Admitting: Student in an Organized Health Care Education/Training Program

## 2020-01-21 VITALS — BP 120/85 | HR 81 | Temp 98.2°F | Resp 18 | Ht <= 58 in | Wt 146.0 lb

## 2020-01-21 DIAGNOSIS — Z981 Arthrodesis status: Secondary | ICD-10-CM | POA: Insufficient documentation

## 2020-01-21 DIAGNOSIS — M25512 Pain in left shoulder: Secondary | ICD-10-CM | POA: Insufficient documentation

## 2020-01-21 DIAGNOSIS — M542 Cervicalgia: Secondary | ICD-10-CM | POA: Diagnosis present

## 2020-01-21 DIAGNOSIS — G8929 Other chronic pain: Secondary | ICD-10-CM | POA: Diagnosis present

## 2020-01-21 DIAGNOSIS — M67912 Unspecified disorder of synovium and tendon, left shoulder: Secondary | ICD-10-CM | POA: Diagnosis present

## 2020-01-21 DIAGNOSIS — G894 Chronic pain syndrome: Secondary | ICD-10-CM | POA: Diagnosis present

## 2020-01-21 MED ORDER — BUPRENORPHINE 5 MCG/HR TD PTWK
1.0000 | MEDICATED_PATCH | TRANSDERMAL | 2 refills | Status: DC
Start: 2020-02-10 — End: 2020-04-14

## 2020-01-21 MED ORDER — TRAMADOL HCL 50 MG PO TABS
50.0000 mg | ORAL_TABLET | Freq: Every day | ORAL | 0 refills | Status: AC | PRN
Start: 2020-01-21 — End: 2020-02-20

## 2020-01-21 NOTE — Progress Notes (Signed)
Nursing Pain Medication Assessment:  Safety precautions to be maintained throughout the outpatient stay will include: orient to surroundings, keep bed in low position, maintain call bell within reach at all times, provide assistance with transfer out of bed and ambulation.  Medication Inspection Compliance: Pill count conducted under aseptic conditions, in front of the patient. Neither the pills nor the bottle was removed from the patient's sight at any time. Once count was completed pills were immediately returned to the patient in their original bottle.  Medication: Buprenorphine (Suboxone) Pill/Patch Count: 3 of 4 patches remain Pill/Patch Appearance: Markings consistent with prescribed medication Bottle Appearance: Standard pharmacy container. Clearly labeled. Filled Date: 09 / 22/ 2021 Last Medication intake:  Today

## 2020-01-21 NOTE — Progress Notes (Signed)
PROVIDER NOTE: Information contained herein reflects review and annotations entered in association with encounter. Interpretation of such information and data should be left to medically-trained personnel. Information provided to patient can be located elsewhere in the medical record under "Patient Instructions". Document created using STT-dictation technology, any transcriptional errors that may result from process are unintentional.    Patient: Brianna Arnold  Service Category: E/M  Provider: Gillis Santa, MD  DOB: October 28, 1990  DOS: 01/21/2020  Specialty: Interventional Pain Management  MRN: [id number]  Setting: Ambulatory outpatient  PCP: Idelle Crouch, MD  Type: Established Patient    Referring Provider: Idelle Crouch, MD  Location: Office  Delivery: Face-to-face     HPI  Reason for encounter: [name], a 29 y.o. year old female, is here today for evaluation and management of her Chronic left shoulder pain [M25.512, G89.29]. [name]'s primary complain today is Neck Pain Last encounter: Practice (01/07/2020). My last encounter with her was on 01/07/2020. Pertinent problems: [name] has Status post cervical spinal fusion; Migraine without aura and without status migrainosus, not intractable; History of lumbar spinal fusion (T11- iliac); Cervicalgia; Chronic left shoulder pain; Disorder of left rotator cuff; and Chronic pain syndrome on their pertinent problem list. Pain Assessment: Severity of Chronic pain is reported as a 4 /10. Location: Neck Left/radiates around to jaw. Onset: More than a month ago. Quality: Throbbing. Timing: Constant. Modifying factor(s): sleeping, ice. Vitals:  height is 4' 7" (1.397 m) and weight is 146 lb (66.2 kg). Her temperature is 98.2 F (36.8 C). Her blood pressure is 120/85 and her pulse is 81. Her respiration is 18 and oxygen saturation is 100%.   Patient presents today for medication management.  She currently has a left  Sprint axillary nerve peripheral nerve stimulator in place.  She states that left axillary nerve peripheral nerve stimulation is very helpful for her.  She notices a 50% reduction in her left shoulder pain and improvement in range of motion.  This is further confirmed by the patient's mother who states that her daughter's mood is better and she is able to do more as well during the day.  Patient states that she tries to use stimulation for approximately 12 hours during the day.  She has been working with Roselyn Reef who is a Set designer nerve stimulation representative to monitor her stimulation parameters and her pain relief.  Today we will refill her Butrans patch.  I will also take over her tramadol which is prescribed by her primary care provider.  She takes this medication very seldomly only when she has severe breakthrough pain which is 2-3 times a week.  Pharmacotherapy Assessment   Analgesic: Butrans patch 5 mcg an hour, tramadol 50 mg for breakthrough pain (uses it 3-4 times a week)  Monitoring: [place] PMP: PDMP not reviewed this encounter.       Pharmacotherapy: No side-effects or adverse reactions reported. Compliance: No problems identified. Effectiveness: Clinically acceptable.  Dewayne Shorter, RN  01/21/2020  1:21 PM  Sign when Signing Visit Nursing Pain Medication Assessment:  Safety precautions to be maintained throughout the outpatient stay will include: orient to surroundings, keep bed in low position, maintain call bell within reach at all times, provide assistance with transfer out of bed and ambulation.  Medication Inspection Compliance: Pill count conducted under aseptic conditions, in front of the patient. Neither the pills nor the bottle was removed from the patient's sight at any time. Once count was completed pills were  immediately returned to the patient in their original bottle.  Medication: Buprenorphine (Suboxone) Pill/Patch Count: 3 of 4 patches remain Pill/Patch  Appearance: Markings consistent with prescribed medication Bottle Appearance: Standard pharmacy container. Clearly labeled. Filled Date: 09 / 22/ 2021 Last Medication intake:  Today    UDS:  Summary  Date Value Ref Range Status  10/15/2019 Note  Final    Comment:    ==================================================================== Compliance Drug Analysis, Ur ==================================================================== Test                             Result       Flag       Units  Drug Absent but Declared for Prescription Verification   Buprenorphine                  Not Detected UNEXPECTED ng/mg creat    Transdermal buprenorphine, as indicated in the declared medication    list, is not always detected even when used as directed.    Tramadol                       Not Detected UNEXPECTED ng/mg creat   Gabapentin                     Not Detected UNEXPECTED   Cyclobenzaprine                Not Detected UNEXPECTED   Trazodone                      Not Detected UNEXPECTED   Promethazine                   Not Detected UNEXPECTED ==================================================================== Test                      Result    Flag   Units      Ref Range   Creatinine              91               mg/dL      >=20 ==================================================================== Declared Medications:  The flagging and interpretation on this report are based on the  following declared medications.  Unexpected results may arise from  inaccuracies in the declared medications.   **Note: The testing scope of this panel includes these medications:   Cyclobenzaprine (Flexeril)  Gabapentin (Neurontin)  Promethazine (Phenergan)  Tramadol (Ultram)  Trazodone (Desyrel)   **Note: The testing scope of this panel does not include small to  moderate amounts of these reported medications:   Buprenorphine Patch (BuTrans)   **Note: The testing scope of this panel does not  include the  following reported medications:   Albuterol (Ventolin HFA)  Ethinyl Estradiol (Sprintec)  Famotidine (Pepcid)  Iron  Norgestimate (Sprintec)  Vitamin B12  Vitamin D3 ==================================================================== For clinical consultation, please call [phone]. ====================================================================      ROS  Constitutional: Denies any fever or chills Gastrointestinal: No reported hemesis, hematochezia, vomiting, or acute GI distress Musculoskeletal: Denies any acute onset joint swelling, redness, loss of ROM, or weakness Neurological: No reported episodes of acute onset apraxia, aphasia, dysarthria, agnosia, amnesia, paralysis, loss of coordination, or loss of consciousness  Medication Review  Cyanocobalamin, Vitamin D3, albuterol, buprenorphine, cyclobenzaprine, famotidine, ferrous sulfate, gabapentin, norgestimate-ethinyl estradiol, promethazine, traMADol, traZODone, and vitamin B-12  History Review  Allergy:  [name] is allergic to ativan [lorazepam] and tizanidine. Drug: [name]  reports no history of drug use. Alcohol:  has no history on file for alcohol use. Tobacco:  reports that she has never smoked. She has never used smokeless tobacco. Social: [name]  reports that she has never smoked. She has never used smokeless tobacco. She reports that she does not use drugs. Medical:  has a past medical history of Anemia and Jarcho-Levin syndrome. Surgical: [name]  has a past surgical history that includes Cervical spine surgery (07/2014); Back surgery (10/2014); and Anterior cervical decomp/discectomy fusion (N/A, 02/11/2019). Family: family history includes Hypertension in her father.  Laboratory Chemistry Profile   Renal Lab Results  Component Value Date   BUN 6 02/15/2019   CREATININE 0.63 02/15/2019   GFRAA >60 02/15/2019   GFRNONAA >60 02/15/2019     Hepatic Lab Results  Component  Value Date   AST 17 02/14/2019   ALT 12 02/14/2019   ALBUMIN 3.9 02/14/2019   ALKPHOS 55 02/14/2019   LIPASE 18 02/14/2019     Electrolytes Lab Results  Component Value Date   NA 137 02/15/2019   K 4.1 02/15/2019   CL 106 02/15/2019   CALCIUM 8.1 (L) 02/15/2019     Bone No results found for: VD25OH, YD741OI7OMV, EH2094BS9, GG8366QH4, 25OHVITD1, 25OHVITD2, 25OHVITD3, TESTOFREE, TESTOSTERONE   Inflammation (CRP: Acute Phase) (ESR: Chronic Phase) Lab Results  Component Value Date   LATICACIDVEN 1.6 04/19/2015       Note: Above Lab results reviewed.  Recent Imaging Review  DG ABD ACUTE 2+V W 1V CHEST CLINICAL DATA:  Abdominal pain and constipation. Jarcho-Levin syndrome  EXAM: DG ABDOMEN ACUTE W/ 1V CHEST  COMPARISON:  Chest radiograph April 19, 2015  FINDINGS: Frontal chest: There is atelectatic change in the left base with small left pleural effusion. Lungs elsewhere are clear. The heart size and pulmonary vascular normal. There are multiple rib defects, stable. Postoperative changes noted in the cervical, lower thoracic, and lumbar spine regions.  Supine and upright abdomen: There is diffuse stool throughout colon. There is no bowel dilatation or air-fluid level to suggest bowel obstruction. No free air.  IMPRESSION: Diffuse stool throughout colon consistent with constipation. No bowel obstruction or free air.  Atelectasis left base with small left pleural effusion. Lungs elsewhere clear.  Extensive postoperative changes in the spine. 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  Electronically Signed   By: Lowella Grip III M.D.   On: 02/14/2019 11:21 Note: Reviewed        Physical Exam  General appearance: Well nourished, well developed, and well hydrated. In no apparent acute distress Mental status: Alert, oriented x 3 (person, place, & time)       Respiratory: No evidence of acute respiratory distress Eyes:  [name] Vitals: BP 120/85   Pulse 81   Temp 98.2 F (36.8 C)   Resp 18   Ht 4' 7" (1.397 m)   Wt 146 lb (66.2 kg)   LMP 12/21/2019   SpO2 100%   BMI 33.93 kg/m  BMI: Estimated body mass index is 33.93 kg/m as calculated from the following:   Height as of this encounter: 4' 7" (1.397 m).   Weight as of this encounter: 146 lb (66.2 kg). Ideal: Patient must be at least 60 in tall to calculate ideal body weight   Cervical Spine Exam  Skin & Axial Inspection: Well healed scar from previous spine surgery detected Alignment: Asymmetric Functional ROM: Pain  restricted ROM      Stability: No instability detected Muscle Tone/Strength: Functionally intact. No obvious neuro-muscular anomalies detected. Sensory (Neurological): Neuropathic pain pattern Palpation: No palpable anomalies                    Upper Extremity (UE) Exam    Side: Right upper extremity  Side: Left upper extremity   Skin & Extremity Inspection: Skin color, temperature, and hair growth are WNL. No peripheral edema or cyanosis. No masses, redness, swelling, asymmetry, or associated skin lesions. No contractures.  Skin & Extremity Inspection:  Axillary nerve peripheral nerve stimulator in place, insertion site not erythematous, nonindurated.   Functional ROM: Unrestricted ROM          Functional ROM: Decreased ROM for all joints of upper extremity however improved since axillary nerve peripheral nerve stimulation   Muscle Tone/Strength: Functionally intact. No obvious neuro-muscular anomalies detected.  Muscle Tone/Strength: Functionally intact. No obvious neuro-muscular anomalies detected.   Sensory (Neurological): Dermatomal pain pattern          Sensory (Neurological): Dermatomal pain pattern           Palpation: No palpable anomalies              Palpation: No palpable anomalies               Provocative Test(s):  Phalen's test: deferred Tinel's test: deferred Apley's scratch test (touch opposite shoulder):   Action 1 (Across chest): deferred Action 2 (Overhead): deferred Action 3 (LB reach): deferred   Provocative Test(s):  Phalen's test: deferred Tinel's test: deferred Apley's scratch test (touch opposite shoulder):  Action 1 (Across chest): Decreased ROM Action 2 (Overhead): Decreased ROM Action 3 (LB reach): Decreased ROM     Thoracic Spine Area Exam  Skin & Axial Inspection: Well healed scar from previous spine surgery detected Alignment: Symmetrical Functional ROM: Mechanically restricted ROM Stability: No instability detected Muscle Tone/Strength: Functionally intact. No obvious neuro-muscular anomalies detected. Sensory (Neurological): Neurogenic pain pattern Muscle strength & Tone: No palpable anomalies  Lumbar Exam  Skin & Axial Inspection: Well healed scar from previous spine surgery detected Alignment: Scoliosis detected Functional ROM: Pain restricted ROM       Stability: No instability detected Muscle Tone/Strength: Functionally intact. No obvious neuro-muscular anomalies detected. Sensory (Neurological):  Musculoskeletal pain pattern Palpation: Complains of area being tender to palpation        Gait & Posture Assessment  Ambulation: Limited Gait: Antalgic Posture: Difficulty standing up straight, due to pain   Lower Extremity Exam    Side: Right lower extremity  Side: Left lower extremity  Stability: No instability observed          Stability: No instability observed          Skin & Extremity Inspection: Skin color, temperature, and hair growth are WNL. No peripheral edema or cyanosis. No masses, redness, swelling, asymmetry, or associated skin lesions. No contractures.  Skin & Extremity Inspection: Skin color, temperature, and hair growth are WNL. No peripheral edema or cyanosis. No masses, redness, swelling, asymmetry, or associated skin lesions. No contractures.  Functional ROM: Pain restricted ROM                  Functional ROM: Pain restricted  ROM                  Muscle Tone/Strength: Functionally intact. No obvious neuro-muscular anomalies detected.  Muscle Tone/Strength: Functionally intact. No obvious neuro-muscular anomalies detected.  Sensory (  Neurological): Unimpaired        Sensory (Neurological): Unimpaired        DTR: Patellar: deferred today Achilles: deferred today Plantar: deferred today  DTR: Patellar: deferred today Achilles: deferred today Plantar: deferred today  Palpation: No palpable anomalies  Palpation: No palpable anomalies     Assessment   Status Diagnosis  Controlled Controlled Controlled 1. Chronic left shoulder pain   2. Disorder of left rotator cuff   3. Cervicalgia   4. Status post cervical spinal fusion   5. History of lumbar spinal fusion (T11- iliac)   6. Chronic pain syndrome      Updated Problems: Problem  Chronic Pain Syndrome  Disorder of Left Rotator Cuff  History of lumbar spinal fusion (T11- iliac)  Cervicalgia  Chronic Left Shoulder Pain  Status Post Cervical Spinal Fusion  Migraine Without Aura and Without Status Migrainosus, Not Intractable    Plan of Care  Ms. Geanette Brianna Arnold has a current medication list which includes the following long-term medication(s): albuterol, famotidine, ferrous sulfate, gabapentin, promethazine, sprintec 28, and trazodone.  1.  Continue with Sprint peripheral nerve stimulation of left axillary nerve.  Return in November for lead removal.  Consider sprint peripheral nerve stimulation of lumbar medial branch for low back pain. 2.  Refill Butrans patch as below. 3.  Prescription for tramadol as below  Pharmacotherapy (Medications Ordered): Meds ordered this encounter  Medications  . buprenorphine (BUTRANS) 5 MCG/HR PTWK    Sig: Place 1 patch onto the skin every 7 (seven) days.    Dispense:  4 patch    Refill:  2    For chronic pain syndrome  . traMADol (ULTRAM) 50 MG tablet    Sig: Take 1 tablet (50 mg total) by mouth  daily as needed for severe pain.    Dispense:  30 tablet    Refill:  0    For chronic pain syndrome   Follow-up plan:   Return in about 3 months (around 04/21/2020) for Medication Management, in person.      Pharmacological management options:  Opioid Analgesics: Butrans patch, side effects at 7.5 mcg an hour, analgesic benefit with no side effects at 5 mcg an hour, continue  Membrane stabilizer: Continue gabapentin as prescribed  Muscle relaxant: Continue Flexeril as prescribed  NSAID: To be determined at a later time  Other analgesic(s): To be determined at a later time   Interventional management options: Ms. Defranco was informed that there is no guarantee that she would be a candidate for interventional therapies. The decision will be based on the results of diagnostic studies, as well as Ms. Harold's risk profile.  Procedure(s) under consideration:  S/p left TPI 11/04/19 75% pain relief for 5 days. Left axillary peripheral nerve stimulation: 12/30/19 providing significant pain relief. Consider sprint peripheral nerve stimulation of lumbar medial branch in future.         Recent Visits Date Type Provider Dept  12/30/19 Procedure visit Gillis Santa, MD [facility]  12/03/19 Telemedicine Gillis Santa, MD [facility]  11/04/19 Procedure visit Gillis Santa, MD [facility]  Showing recent visits within past 90 days and meeting all other requirements Today's Visits Date Type Provider Dept  01/21/20 Office Visit Gillis Santa, MD [facility]  Showing today's visits and meeting all other requirements Future Appointments Date Type Provider Dept  02/29/20 Appointment Gillis Santa, MD [facility]  04/14/20 Appointment Gillis Santa, MD [facility]  Showing future appointments within  next 90 days and meeting all other requirements  I discussed the assessment and treatment plan with the patient. The patient was provided an  opportunity to ask questions and all were answered. The patient agreed with the plan and demonstrated an understanding of the instructions.  Patient advised to call back or seek an in-person evaluation if the symptoms or condition worsens.  Duration of encounter: 30 minutes.  Note by: Gillis Santa, MD Date: 01/21/2020; Time: 1:52 PM

## 2020-02-02 ENCOUNTER — Encounter: Payer: Medicare HMO | Admitting: Student in an Organized Health Care Education/Training Program

## 2020-02-29 ENCOUNTER — Encounter: Payer: Self-pay | Admitting: Student in an Organized Health Care Education/Training Program

## 2020-02-29 ENCOUNTER — Other Ambulatory Visit: Payer: Self-pay

## 2020-02-29 ENCOUNTER — Ambulatory Visit
Payer: Medicare HMO | Attending: Student in an Organized Health Care Education/Training Program | Admitting: Student in an Organized Health Care Education/Training Program

## 2020-02-29 VITALS — BP 131/85 | HR 83 | Temp 97.3°F | Resp 16 | Ht <= 58 in | Wt 146.0 lb

## 2020-02-29 DIAGNOSIS — M25512 Pain in left shoulder: Secondary | ICD-10-CM | POA: Insufficient documentation

## 2020-02-29 DIAGNOSIS — M67912 Unspecified disorder of synovium and tendon, left shoulder: Secondary | ICD-10-CM | POA: Insufficient documentation

## 2020-02-29 DIAGNOSIS — G8929 Other chronic pain: Secondary | ICD-10-CM | POA: Insufficient documentation

## 2020-02-29 DIAGNOSIS — G894 Chronic pain syndrome: Secondary | ICD-10-CM | POA: Insufficient documentation

## 2020-02-29 NOTE — Progress Notes (Signed)
PROVIDER NOTE: Information contained herein reflects review and annotations entered in association with encounter. Interpretation of such information and data should be left to medically-trained personnel. Information provided to patient can be located elsewhere in the medical record under "Patient Instructions". Document created using STT-dictation technology, any transcriptional errors that may result from process are unintentional.    Patient: Brianna Arnold  Service Category: E/M  Provider: Gillis Santa, MD  DOB: [birth year]  DOS: 02/29/2020  Specialty: Interventional Pain Management  MRN: [id number]  Setting: Ambulatory outpatient  PCP: Idelle Crouch, MD  Type: Established Patient    Referring Provider: Idelle Crouch, MD  Location: Office  Delivery: Face-to-face     HPI  Brianna Arnold, a 29 y.o. year old female, is here today because of her Chronic left shoulder pain [M25.512, G89.29]. Brianna Arnold's primary complain today is Neck Pain (left ) and Shoulder Pain (left ) Last encounter: My last encounter with her was on 01/21/2020. Pertinent problems: Brianna Arnold has Status post cervical spinal fusion; Migraine without aura and without status migrainosus, not intractable; History of lumbar spinal fusion (T11- iliac); Cervicalgia; Chronic left shoulder pain; Disorder of left rotator cuff; and Chronic pain syndrome on their pertinent problem list. Pain Assessment: Severity of Chronic pain is reported as a 5 /10. Location: Neck Left/into shoulder. Onset: More than a month ago. Quality: Aching, Discomfort, Constant. Timing: Constant. Modifying factor(s): PNS has helped, pain patches. Vitals:  height is '4\' 7"'  (1.397 m) and weight is 146 lb (66.2 kg). Her temporal temperature is 97.3 F (36.3 C) (abnormal). Her blood pressure is 131/85 and her pulse is 83. Her respiration is 16 and oxygen saturation is 100%.   Reason for encounter: post-procedure assessment.  Patient  presents today for her sprint left axillary nerve peripheral nerve stimulation system lead pull.  She states that Sprint peripheral nerve stimulation of left axillary nerve has been helpful for her chronic left shoulder and neck pain.  She was able to perform ADLs with less pain and had better range of motion.  We will see how she does after removal of peripheral nerve stimulation system.  She continues her buprenorphine patch as prescribed.  Patient was noticing some skin irritation at the site of patch application and I recommend that she try fluticasone spray prior to patch application after it has dried.  Patient endorsed understanding.  She will follow-up for medication management visit in the future.  She was inquiring about peripheral nerve stimulation for her low back pain.  I recommend that we monitor how she does after removal of her left axillary nerve peripheral nerve system and if she is continuing to see benefit with it even after 8 to 10 weeks and we can discuss the low back system further.  Patient in agreement with plan.  ROS  Constitutional: Denies any fever or chills Gastrointestinal: No reported hemesis, hematochezia, vomiting, or acute GI distress Musculoskeletal: Denies any acute onset joint swelling, redness, loss of ROM, or weakness Neurological: No reported episodes of acute onset apraxia, aphasia, dysarthria, agnosia, amnesia, paralysis, loss of coordination, or loss of consciousness  Medication Review  Cyanocobalamin, Vitamin D3, albuterol, buprenorphine, cyclobenzaprine, famotidine, ferrous sulfate, gabapentin, norgestimate-ethinyl estradiol, promethazine, traMADol, traZODone, and vitamin B-12  History Review  Allergy: Brianna Arnold is allergic to hypafix [wound dressings], ativan [lorazepam], and tizanidine. Drug: Brianna Arnold  reports no history of drug use. Alcohol:  has no history on file for alcohol use. Tobacco:  reports that she has  never smoked. She has never used  smokeless tobacco. Social: Brianna Arnold  reports that she has never smoked. She has never used smokeless tobacco. She reports that she does not use drugs. Medical:  has a past medical history of Anemia and Jarcho-Levin syndrome. Surgical: Brianna Arnold  has a past surgical history that includes Cervical spine surgery (07/2014); Back surgery (10/2014); and Anterior cervical decomp/discectomy fusion (N/A, 02/11/2019). Family: family history includes Hypertension in her father.  Laboratory Chemistry Profile   Renal Lab Results  Component Value Date   BUN 6 02/15/2019   CREATININE 0.63 02/15/2019   GFRAA >60 02/15/2019   GFRNONAA >60 02/15/2019     Hepatic Lab Results  Component Value Date   AST 17 02/14/2019   ALT 12 02/14/2019   ALBUMIN 3.9 02/14/2019   ALKPHOS 55 02/14/2019   LIPASE 18 02/14/2019     Electrolytes Lab Results  Component Value Date   NA 137 02/15/2019   K 4.1 02/15/2019   CL 106 02/15/2019   CALCIUM 8.1 (L) 02/15/2019     Bone No results found for: VD25OH, [id number], AS5053ZJ6, BH4193XT0, 25OHVITD1, 25OHVITD2, 25OHVITD3, TESTOFREE, TESTOSTERONE   Inflammation (CRP: Acute Phase) (ESR: Chronic Phase) Lab Results  Component Value Date   LATICACIDVEN 1.6 04/19/2015       Note: Above Lab results reviewed.  Recent Imaging Review  DG ABD ACUTE 2+V W 1V CHEST CLINICAL DATA:  Abdominal pain and constipation. Jarcho-Levin syndrome  EXAM: DG ABDOMEN ACUTE W/ 1V CHEST  COMPARISON:  Chest radiograph April 19, 2015  FINDINGS: Frontal chest: There is atelectatic change in the left base with small left pleural effusion. Lungs elsewhere are clear. The heart size and pulmonary vascular normal. There are multiple rib defects, stable. Postoperative changes noted in the cervical, lower thoracic, and lumbar spine regions.  Supine and upright abdomen: There is diffuse stool throughout colon. There is no bowel dilatation or air-fluid level to suggest  bowel obstruction. No free air.  IMPRESSION: Diffuse stool throughout colon consistent with constipation. No bowel obstruction or free air.  Atelectasis left base with small left pleural effusion. Lungs elsewhere clear.  Extensive postoperative changes in the spine. 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  Electronically Signed   By: Lowella Grip III M.D.   On: 02/14/2019 11:21 Note: Reviewed        Physical Exam  General appearance: Well nourished, well developed, and well hydrated. In no apparent acute distress Mental status: Alert, oriented x 3 (person, place, & time)       Respiratory: No evidence of acute respiratory distress Eyes: [name] Vitals: BP 131/85 (BP Location: Right Arm, Patient Position: Sitting, Cuff Size: Normal)   Pulse 83   Temp (!) 97.3 F (36.3 C) (Temporal)   Resp 16   Ht '4\' 7"'  (1.397 m)   Wt 146 lb (66.2 kg)   SpO2 100%   BMI 33.93 kg/m  BMI: Estimated body mass index is 33.93 kg/m as calculated from the following:   Height as of this encounter: '4\' 7"'  (1.397 m).   Weight as of this encounter: 146 lb (66.2 kg). Ideal: Patient must be at least 60 in tall to calculate ideal body weight  Upper Extremity (UE) Exam    Side: Right upper extremity  Side: Left upper extremity  Skin & Extremity Inspection: Skin color, temperature, and hair growth are WNL. No peripheral edema or cyanosis. No masses, redness, swelling, asymmetry, or associated skin lesions. No contractures.  Skin & Extremity  Inspection: Left axillary Sprint peripheral nerve system catheter removed with tip intact.  Nursing staff in room.  Skin site/insertion site unremarkable.  No erythema or tenderness noted.  Functional ROM: Unrestricted ROM          Functional ROM: Improved after treatment          Muscle Tone/Strength: Functionally intact. No obvious neuro-muscular anomalies detected.  Muscle Tone/Strength: Functionally intact. No obvious neuro-muscular  anomalies detected.  Sensory (Neurological): Unimpaired          Sensory (Neurological): Improved          Palpation: No palpable anomalies              Palpation: No palpable anomalies              Provocative Test(s):  Phalen's test: deferred Tinel's test: deferred Apley's scratch test (touch opposite shoulder):  Action 1 (Across chest): deferred Action 2 (Overhead): deferred Action 3 (LB reach): deferred   Provocative Test(s):  Phalen's test: deferred Tinel's test: deferred Apley's scratch test (touch opposite shoulder):  Action 1 (Across chest): Improved ROM Action 2 (Overhead): Improved ROM Action 3 (LB reach): Decreased ROM     Assessment   Status Diagnosis  Controlled Controlled Controlled 1. Chronic left shoulder pain   2. Disorder of left rotator cuff   3. Chronic pain syndrome       Plan of Care  Left axillary nerve Sprint peripheral nerve stimulation system removed with tip intact.  Will monitor how she does in regards to her left shoulder pain.  May consider implant in future. We will hold off on lumbar medial branch Sprint peripheral nerve stimulation until at least after the new year to see how she responds postprocedure [facility]ward for her left shoulder pain. Continue Butrans patch as below.  Follow-up in December for medication management.   Follow-up plan:   No follow-ups on file.      Pharmacological management options:  Opioid Analgesics: Butrans patch, side effects at 7.5 mcg an hour, analgesic benefit with no side effects at 5 mcg an hour, continue  Membrane stabilizer: Continue gabapentin as prescribed  Muscle relaxant: Continue Flexeril as prescribed  NSAID: To be determined at a later time  Other analgesic(s): To be determined at a later time   Interventional management options: Brianna Arnold was informed that there is no guarantee that she would be a candidate for interventional therapies. The decision will be based on the results of diagnostic studies,  as well as Brianna Arnold's risk profile.  Procedure(s) under consideration:  S/p left TPI 11/04/19 75% pain relief for 5 days. Left axillary peripheral nerve stimulation: 12/30/19 providing significant pain relief.  Removed 02/29/2020 Consider sprint peripheral nerve stimulation of lumbar medial branch in future.          Recent Visits Date Type Provider Dept  01/21/20 Office Visit Gillis Santa, MD [facility]  12/30/19 Procedure visit Gillis Santa, MD [facility]  12/03/19 Telemedicine Gillis Santa, MD [facility]  Showing recent visits within past 90 days and meeting all other requirements Today's Visits Date Type Provider Dept  02/29/20 Procedure visit Gillis Santa, MD [facility]  Showing today's visits and meeting all other requirements Future Appointments Date Type Provider Dept  04/14/20 Appointment Gillis Santa, MD [facility]  Showing future appointments within next 90 days and meeting all other requirements  I discussed the assessment and treatment plan with the patient. The patient was provided an opportunity to  ask questions and all were answered. The patient agreed with the plan and demonstrated an understanding of the instructions.  Patient advised to call back or seek an in-person evaluation if the symptoms or condition worsens.  Duration of encounter: 30 minutes.  Note by: Gillis Santa, MD Date: 02/29/2020; Time: 10:06 AM

## 2020-02-29 NOTE — Progress Notes (Signed)
Safety precautions to be maintained throughout the outpatient stay will include: orient to surroundings, keep bed in low position, maintain call bell within reach at all times, provide assistance with transfer out of bed and ambulation.  

## 2020-03-01 ENCOUNTER — Telehealth: Payer: Self-pay

## 2020-03-01 NOTE — Telephone Encounter (Signed)
Denies any needs at this time. Instructed to call if needed. 

## 2020-04-14 ENCOUNTER — Encounter: Payer: Self-pay | Admitting: Student in an Organized Health Care Education/Training Program

## 2020-04-14 ENCOUNTER — Other Ambulatory Visit: Payer: Self-pay

## 2020-04-14 ENCOUNTER — Ambulatory Visit
Payer: Medicare HMO | Attending: Student in an Organized Health Care Education/Training Program | Admitting: Student in an Organized Health Care Education/Training Program

## 2020-04-14 VITALS — BP 118/68 | HR 81 | Temp 97.0°F | Resp 18 | Ht <= 58 in | Wt 146.0 lb

## 2020-04-14 DIAGNOSIS — Q766 Other congenital malformations of ribs: Secondary | ICD-10-CM | POA: Diagnosis present

## 2020-04-14 DIAGNOSIS — M7918 Myalgia, other site: Secondary | ICD-10-CM | POA: Insufficient documentation

## 2020-04-14 DIAGNOSIS — Z981 Arthrodesis status: Secondary | ICD-10-CM | POA: Diagnosis not present

## 2020-04-14 DIAGNOSIS — M542 Cervicalgia: Secondary | ICD-10-CM | POA: Diagnosis not present

## 2020-04-14 DIAGNOSIS — G8929 Other chronic pain: Secondary | ICD-10-CM | POA: Insufficient documentation

## 2020-04-14 DIAGNOSIS — M4105 Infantile idiopathic scoliosis, thoracolumbar region: Secondary | ICD-10-CM | POA: Insufficient documentation

## 2020-04-14 DIAGNOSIS — M25512 Pain in left shoulder: Secondary | ICD-10-CM | POA: Insufficient documentation

## 2020-04-14 DIAGNOSIS — Q7649 Other congenital malformations of spine, not associated with scoliosis: Secondary | ICD-10-CM | POA: Insufficient documentation

## 2020-04-14 DIAGNOSIS — M67912 Unspecified disorder of synovium and tendon, left shoulder: Secondary | ICD-10-CM | POA: Diagnosis present

## 2020-04-14 DIAGNOSIS — G894 Chronic pain syndrome: Secondary | ICD-10-CM | POA: Insufficient documentation

## 2020-04-14 MED ORDER — BUPRENORPHINE 5 MCG/HR TD PTWK
1.0000 | MEDICATED_PATCH | TRANSDERMAL | 2 refills | Status: AC
Start: 2019-05-25 — End: 2019-08-17

## 2020-04-14 MED ORDER — GABAPENTIN 250 MG/5ML PO SOLN
250.0000 mg | Freq: Three times a day (TID) | ORAL | 2 refills | Status: DC
Start: 2020-04-14 — End: 2021-04-13

## 2020-04-14 NOTE — Progress Notes (Signed)
PROVIDER NOTE: Information contained herein reflects review and annotations entered in association with encounter. Interpretation of such information and data should be left to medically-trained personnel. Information provided to patient can be located elsewhere in the medical record under "Patient Instructions". Document created using STT-dictation technology, any transcriptional errors that may result from process are unintentional.    Patient: Brianna Arnold  Service Category: E/M  Provider: Gillis Santa, MD  DOB: 1991-01-05  DOS: 04/14/2020  Specialty: Interventional Pain Management  MRN: [id number]  Setting: Ambulatory outpatient  PCP: Idelle Crouch, MD  Type: Established Patient    Referring Provider: Idelle Crouch, MD  Location: Office  Delivery: Face-to-face     HPI  [name], a 28 y.o. year old female, is here today because of her Infantile idiopathic scoliosis of thoracolumbar region [M41.05]. [name]'s primary complain today is Shoulder Pain (left), Back Pain (low), and Hip Pain (bilateral) Last encounter: My last encounter with her was on 02/29/2020. Pertinent problems: [name] has Status post cervical spinal fusion; Migraine without aura and without status migrainosus, not intractable; History of lumbar spinal fusion (T11- iliac); Cervicalgia; Chronic left shoulder pain; Disorder of left rotator cuff; and Chronic pain syndrome on their pertinent problem list. Pain Assessment: Severity of Chronic pain is reported as a 3 /10. Location: Back Lower/hips. Onset: More than a month ago. Quality: Constant,Aching,Spasm,Tightness. Timing: Constant. Modifying factor(s): medications, hot baths, rest. Vitals:  height is '4\' 7"'  (1.397 m) and weight is 146 lb (66.2 kg). Her temporal temperature is 97 F (36.1 C) (abnormal). Her blood pressure is 118/68 and her pulse is 81. Her respiration is 18 and oxygen saturation is 100%.   Reason for encounter: medication  management.    No change in medical history since last visit.  Patient's pain is at baseline.  Patient continues multimodal pain regimen as prescribed.  States that it provides pain relief and improvement in functional status. Completion of left axillary nerve peripheral nerve stimulation therapy on 02/29/2020 continues to endorse therapeutic benefit from stimulation therapy. Continues to have persistent low back pain related to lumbar facet arthropathy.  Discussed trigger point injections for lumbar myofascial pain syndrome.   Pharmacotherapy Assessment   Analgesic: Butrans patch 5 mcg/hr  Monitoring: [place] PMP: PDMP reviewed during this encounter.       Pharmacotherapy: No side-effects or adverse reactions reported. Compliance: No problems identified. Effectiveness: Clinically acceptable.  Hart Rochester, RN  04/14/2020 12:06 PM  Sign when Signing Visit Nursing Pain Medication Assessment:  Safety precautions to be maintained throughout the outpatient stay will include: orient to surroundings, keep bed in low position, maintain call bell within reach at all times, provide assistance with transfer out of bed and ambulation.  Medication Inspection Compliance: Pill count conducted under aseptic conditions, in front of the patient. Neither the pills nor the bottle was removed from the patient's sight at any time. Once count was completed pills were immediately returned to the patient in their original bottle.  Medication: Buprenorphine (Suboxone) Pill/Patch Count: 2 of 4 patches remain Pill/Patch Appearance: Markings consistent with prescribed medication Bottle Appearance: Standard pharmacy container. Clearly labeled. Filled Date: 67 / 08 / 2021 Last Medication intake:  04/08/2020    UDS:  Summary  Date Value Ref Range Status  10/15/2019 Note  Final    Comment:    ==================================================================== Compliance Drug Analysis,  Ur ==================================================================== Test  Result       Flag       Units  Drug Absent but Declared for Prescription Verification   Buprenorphine                  Not Detected UNEXPECTED ng/mg creat    Transdermal buprenorphine, as indicated in the declared medication    list, is not always detected even when used as directed.    Tramadol                       Not Detected UNEXPECTED ng/mg creat   Gabapentin                     Not Detected UNEXPECTED   Cyclobenzaprine                Not Detected UNEXPECTED   Trazodone                      Not Detected UNEXPECTED   Promethazine                   Not Detected UNEXPECTED ==================================================================== Test                      Result    Flag   Units      Ref Range   Creatinine              91               mg/dL      >=20 ==================================================================== Declared Medications:  The flagging and interpretation on this report are based on the  following declared medications.  Unexpected results may arise from  inaccuracies in the declared medications.   **Note: The testing scope of this panel includes these medications:   Cyclobenzaprine (Flexeril)  Gabapentin (Neurontin)  Promethazine (Phenergan)  Tramadol (Ultram)  Trazodone (Desyrel)   **Note: The testing scope of this panel does not include small to  moderate amounts of these reported medications:   Buprenorphine Patch (BuTrans)   **Note: The testing scope of this panel does not include the  following reported medications:   Albuterol (Ventolin HFA)  Ethinyl Estradiol (Sprintec)  Famotidine (Pepcid)  Iron  Norgestimate (Sprintec)  Vitamin B12  Vitamin D3 ==================================================================== For clinical consultation, please call [phone]. ====================================================================      ROS  Constitutional: Denies any fever or chills Gastrointestinal: No reported hemesis, hematochezia, vomiting, or acute GI distress Musculoskeletal: Left shoulder pain, bilateral low back pain Neurological: No reported episodes of acute onset apraxia, aphasia, dysarthria, agnosia, amnesia, paralysis, loss of coordination, or loss of consciousness  Medication Review  Vitamin D3, albuterol, cyclobenzaprine, famotidine, ferrous sulfate, gabapentin, promethazine, traMADol, traZODone, and vitamin B-12  History Review  Allergy: [name] is allergic to hypafix [wound dressings], ativan [lorazepam], and tizanidine. Drug: [name]  reports no history of drug use. Alcohol:  has no history on file for alcohol use. Tobacco:  reports that she has never smoked. She has never used smokeless tobacco. Social: [name]  reports that she has never smoked. She has never used smokeless tobacco. She reports that she does not use drugs. Medical:  has a past medical history of Anemia and Jarcho-Levin syndrome. Surgical: [name]  has a past surgical history that includes Cervical spine surgery (07/2014); Back surgery (10/2014); and Anterior cervical decomp/discectomy fusion (N/A, 02/11/2019). Family: family history includes Hypertension in her  father.  Laboratory Chemistry Profile   Renal Lab Results  Component Value Date   BUN 6 02/15/2019   CREATININE 0.63 02/15/2019   GFRAA >60 02/15/2019   GFRNONAA >60 02/15/2019     Hepatic Lab Results  Component Value Date   AST 17 02/14/2019   ALT 12 02/14/2019   ALBUMIN 3.9 02/14/2019   ALKPHOS 55 02/14/2019   LIPASE 18 02/14/2019     Electrolytes Lab Results  Component Value Date   NA 137 02/15/2019   K 4.1 02/15/2019   CL 106 02/15/2019   CALCIUM 8.1 (L) 02/15/2019     Bone No results found for: VD25OH, [id number], AE4975PY0, FR1021RZ7, 25OHVITD1,  25OHVITD2, 25OHVITD3, TESTOFREE, TESTOSTERONE   Inflammation (CRP: Acute Phase) (ESR: Chronic Phase) Lab Results  Component Value Date   LATICACIDVEN 1.6 04/19/2015       Note: Above Lab results reviewed.  Recent Imaging Review  DG ABD ACUTE 2+V W 1V CHEST CLINICAL DATA:  Abdominal pain and constipation. Jarcho-Levin syndrome  EXAM: DG ABDOMEN ACUTE W/ 1V CHEST  COMPARISON:  Chest radiograph April 19, 2015  FINDINGS: Frontal chest: There is atelectatic change in the left base with small left pleural effusion. Lungs elsewhere are clear. The heart size and pulmonary vascular normal. There are multiple rib defects, stable. Postoperative changes noted in the cervical, lower thoracic, and lumbar spine regions.  Supine and upright abdomen: There is diffuse stool throughout colon. There is no bowel dilatation or air-fluid level to suggest bowel obstruction. No free air.  IMPRESSION: Diffuse stool throughout colon consistent with constipation. No bowel obstruction or free air.  Atelectasis left base with small left pleural effusion. Lungs elsewhere clear.  Extensive postoperative changes in the spine. 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  Electronically Signed   By: Lowella Grip III M.D.   On: 02/14/2019 11:21 Note: Reviewed        Physical Exam  General appearance: Well nourished, well developed, and well hydrated. In no apparent acute distress Mental status: Alert, oriented x 3 (person, place, & time)       Respiratory: No evidence of acute respiratory distress Eyes: [name] Vitals: BP 118/68   Pulse 81   Temp (!) 97 F (36.1 C) (Temporal)   Resp 18   Ht '4\' 7"'  (1.397 m)   Wt 146 lb (66.2 kg)   LMP 03/31/2020 (Exact Date)   SpO2 100%   BMI 33.93 kg/m  BMI: Estimated body mass index is 33.93 kg/m as calculated from the following:   Height as of this encounter: '4\' 7"'  (1.397 m).   Weight as of this encounter: 146 lb (66.2  kg). Ideal: Patient must be at least 60 in tall to calculate ideal body weight  Cervical Spine Exam  Skin & Axial Inspection:Well healed scar from previous spine surgery detected Alignment:Asymmetric Functional BVA:POLI restricted ROM Stability:No instability detected Muscle Tone/Strength:Functionally intact. No obvious neuro-muscular anomalies detected. Sensory (Neurological):Neuropathic pain pattern Palpation:No palpable anomalies        Upper Extremity (UE) Exam    Side:Right upper extremity  Side:Left upper extremity   Skin & Extremity Inspection:Skin color, temperature, and hair growth are WNL. No peripheral edema or cyanosis. No masses, redness, swelling, asymmetry, or associated skin lesions. No contractures.  Skin & Extremity Inspection: Axillary nerve peripheral nerve stimulator in place, insertion site not erythematous, nonindurated.   Functional DCV:UDTHYHOOILNZ ROM  Functional VJK:QASUORVIF ROMfor all joints of upper extremity however improved since axillary nerve peripheral nerve stimulation  Muscle Tone/Strength:Functionally intact. No obvious neuro-muscular anomalies detected.  Muscle Tone/Strength:Functionally intact. No obvious neuro-muscular anomalies detected.   Sensory (Neurological):Dermatomal pain pattern  Sensory (Neurological):Dermatomal pain pattern   Palpation:No palpable anomalies  Palpation:No palpable anomalies   Provocative Test(s): Phalen's test:deferred Tinel's test:deferred Apley's scratch test (touch opposite shoulder): Action 1 (Across chest):deferred Action 2 (Overhead):deferred Action 3 (LB reach):deferred   Provocative Test(s): Phalen's test:deferred Tinel's test:deferred Apley's scratch test (touch opposite shoulder): Action 1 (Across chest):Decreased ROM Action 2 (Overhead):Decreased ROM Action 3 (LB reach):Decreased ROM      Thoracic Spine Area Exam  Skin & Axial Inspection:Well healed scar from previous spine surgery detected Alignment:Symmetrical Functional TDS:[id number] restricted ROM Stability:No instability detected Muscle Tone/Strength:Functionally intact. No obvious neuro-muscular anomalies detected. Sensory (Neurological):Neurogenic pain pattern Muscle strength & Tone:No palpable anomalies  Lumbar Exam  Skin & Axial Inspection:Well healed scar from previous spine surgery detected Alignment:Scoliosis detected Functional MBT:DHRC restricted ROM Stability:No instability detected Muscle Tone/Strength:Functionally intact. No obvious neuro-muscular anomalies detected. Sensory (Neurological): Musculoskeletal pain pattern Palpation:Complains of area being tender to palpation  Gait & Posture Assessment  Ambulation:Limited Gait:Antalgic Posture:Difficulty standing up straight, due to pain  Lower Extremity Exam    Side:Right lower extremity  Side:Left lower extremity  Stability:No instability observed  Stability:No instability observed  Skin & Extremity Inspection:Skin color, temperature, and hair growth are WNL. No peripheral edema or cyanosis. No masses, redness, swelling, asymmetry, or associated skin lesions. No contractures.  Skin & Extremity Inspection:Skin color, temperature, and hair growth are WNL. No peripheral edema or cyanosis. No masses, redness, swelling, asymmetry, or associated skin lesions. No contractures.  Functional BUL:AGTX restricted ROM   Functional MIW:OEHO restricted ROM   Muscle Tone/Strength:Functionally intact. No obvious neuro-muscular anomalies detected.  Muscle Tone/Strength:Functionally intact. No obvious neuro-muscular anomalies detected.  Sensory (Neurological):Unimpaired  Sensory (Neurological):Unimpaired  DTR: Patellar:deferred  today Achilles:deferred today Plantar:deferred today  DTR: Patellar:deferred today Achilles:deferred today Plantar:deferred today  Palpation:No palpable anomalies  Palpation:No palpable anomalies      Assessment   Status Diagnosis  Controlled Controlled Controlled 1. Infantile idiopathic scoliosis of thoracolumbar region   2. Autosomal recessive spondylocostal dysostosis   3. Status post cervical spinal fusion   4. Cervicalgia   5. Chronic left shoulder pain   6. Disorder of left rotator cuff   7. History of lumbar spinal fusion (T11- iliac)   8. Chronic pain syndrome   9. Myofascial pain syndrome of lumbar spine       Plan of Care  [name] has a current medication list which includes the following long-term medication(s): albuterol, famotidine, ferrous sulfate, promethazine, trazodone, and gabapentin.  Pharmacotherapy (Medications Ordered): Meds ordered this encounter  Medications  . buprenorphine (BUTRANS) 5 MCG/HR PTWK    Sig: Place 1 patch onto the skin every 7 (seven) days.    Dispense:  4 patch    Refill:  2    For chronic pain syndrome  . gabapentin (NEURONTIN) 250 MG/5ML solution    Sig: Take 5 mLs (250 mg total) by mouth 3 (three) times daily.    Dispense:  470 mL    Refill:  2   Orders:  Orders Placed This Encounter  Procedures  . TRIGGER POINT INJECTION    Standing Status:   Future    Standing Expiration Date:   07/13/2020    Scheduling Instructions:     Lumbar spinal myofascial pain    Order Specific Question:   Where will this procedure be performed?    Answer:  [facility]   Follow-up plan:   Return in about 2 weeks (around 04/28/2020) for Lumbar TPI under [facility]O.      Pharmacological management options:  Opioid Analgesics: Butrans patch, side effects at 7.5 mcg an hour, analgesic benefit with no side effects at 5 mcg an hour, continue  Membrane stabilizer: Continue gabapentin as prescribed  Muscle  relaxant: Continue Flexeril as prescribed  NSAID: To be determined at a later time  Other analgesic(s): To be determined at a later time   Interventional management options: [name] was informed that there is no guarantee that she would be a candidate for interventional therapies. The decision will be based on the results of diagnostic studies, as well as [name]'s risk profile.  Procedure(s) under consideration:  S/p left TPI 11/04/19 75% pain relief for 5 days. Left axillary peripheral nerve stimulation: 12/30/19 providing significant pain relief.  Removed 02/29/2020            Recent Visits Date Type Provider Dept  02/29/20 Procedure visit Gillis Santa, MD [facility]  01/21/20 Office Visit Gillis Santa, MD [facility]  Showing recent visits within past 90 days and meeting all other requirements Today's Visits Date Type Provider Dept  04/14/20 Office Visit Gillis Santa, MD [facility]  Showing today's visits and meeting all other requirements Future Appointments No visits were found meeting these conditions. Showing future appointments within next 90 days and meeting all other requirements  I discussed the assessment and treatment plan with the patient. The patient was provided an opportunity to ask questions and all were answered. The patient agreed with the plan and demonstrated an understanding of the instructions.  Patient advised to call back or seek an in-person evaluation if the symptoms or condition worsens.  Duration of encounter: 21mnutes.  Note by: BGillis Santa MD Date: 04/14/2020; Time: 12:55 PM

## 2020-04-14 NOTE — Progress Notes (Signed)
Nursing Pain Medication Assessment:  Safety precautions to be maintained throughout the outpatient stay will include: orient to surroundings, keep bed in low position, maintain call bell within reach at all times, provide assistance with transfer out of bed and ambulation.  Medication Inspection Compliance: Pill count conducted under aseptic conditions, in front of the patient. Neither the pills nor the bottle was removed from the patient's sight at any time. Once count was completed pills were immediately returned to the patient in their original bottle.  Medication: Buprenorphine (Suboxone) Pill/Patch Count: 2 of 4 patches remain Pill/Patch Appearance: Markings consistent with prescribed medication Bottle Appearance: Standard pharmacy container. Clearly labeled. Filled Date: 58 / 08 / 2021 Last Medication intake:  04/08/2020

## 2020-04-14 NOTE — Patient Instructions (Signed)

## 2020-05-02 ENCOUNTER — Ambulatory Visit (HOSPITAL_BASED_OUTPATIENT_CLINIC_OR_DEPARTMENT_OTHER): Payer: Medicare HMO | Admitting: Student in an Organized Health Care Education/Training Program

## 2020-05-02 ENCOUNTER — Encounter: Payer: Self-pay | Admitting: Student in an Organized Health Care Education/Training Program

## 2020-05-02 ENCOUNTER — Ambulatory Visit
Admission: RE | Admit: 2020-05-02 | Discharge: 2020-05-02 | Disposition: A | Discharge status: Home / Self Care | Payer: Medicare HMO | Source: Ambulatory Visit | Attending: Student in an Organized Health Care Education/Training Program | Admitting: Student in an Organized Health Care Education/Training Program

## 2020-05-02 ENCOUNTER — Other Ambulatory Visit: Payer: Self-pay

## 2020-05-02 DIAGNOSIS — M4105 Infantile idiopathic scoliosis, thoracolumbar region: Secondary | ICD-10-CM | POA: Diagnosis not present

## 2020-05-02 DIAGNOSIS — G894 Chronic pain syndrome: Secondary | ICD-10-CM | POA: Insufficient documentation

## 2020-05-02 DIAGNOSIS — M7918 Myalgia, other site: Secondary | ICD-10-CM | POA: Diagnosis not present

## 2020-05-02 MED ORDER — DIAZEPAM 5 MG PO TABS
ORAL_TABLET | ORAL | Status: AC
  Filled 2020-05-02: qty 1

## 2020-05-02 MED ORDER — ROPIVACAINE HCL 2 MG/ML IJ SOLN
9.0000 mL | Freq: Once | INTRAMUSCULAR | Status: AC
  Administered 2020-05-02: 9 mL via PERINEURAL
  Filled 2020-05-02: qty 10

## 2020-05-02 MED ORDER — DIAZEPAM 5 MG PO TABS
5.0000 mg | ORAL_TABLET | Freq: Once | ORAL | Status: AC
  Administered 2020-05-02: 5 mg via ORAL

## 2020-05-02 NOTE — Progress Notes (Signed)
PROVIDER NOTE: Information contained herein reflects review and annotations entered in association with encounter. Interpretation of such information and data should be left to medically-trained personnel. Information provided to patient can be located elsewhere in the medical record under "Patient Instructions". Document created using STT-dictation technology, any transcriptional errors that may result from process are unintentional.    Patient: Brianna Arnold  Service Category: Procedure  Provider: Edward Jolly, MD  DOB: [birth year]  DOS: 05/02/2020  Location: [facility]  MRN: [id number]  Setting: Ambulatory - outpatient  Referring Provider: Edward Jolly, MD  Type: Established Patient  Specialty: Interventional Pain Management  PCP: Brianna Arbour, MD   Primary Reason for Visit: Interventional Pain Management Treatment. CC: Back Pain  Procedure:          Anesthesia, Analgesia, Anxiolysis:  Type: Lumbar Paraspinal Muscle Trigger Point Injection (3+)         Quadratus lumborum, erector spinae, multifidus CPT: 20553 Primary Purpose: Diagnostic Approach: Percutaneous, ipsilateral approach. Laterality: Midline        Type: Local Anesthesia with PO Valium for anxiety   Position: Prone   Indications: 1. Infantile idiopathic scoliosis of thoracolumbar region   2. Chronic pain syndrome   3. Myofascial pain syndrome of lumbar spine    Pain Score: Pre-procedure: 6 /10 Post-procedure: 4 /10   Pre-op H&P Assessment:  [name] is a 30 y.o. (year old), female patient, seen today for interventional treatment. She  has a past surgical history that includes Cervical spine surgery (07/2014); Back surgery (10/2014); and Anterior cervical decomp/discectomy fusion (N/A, 02/11/2019). [name] has a current medication list which includes the following prescription(s): albuterol, vitamin d3, cyclobenzaprine, famotidine, ferrous sulfate, gabapentin, medroxyprogesterone,  promethazine, tramadol, trazodone, and vitamin b-12. Her primarily concern today is the Back Pain  Initial Vital Signs:  Pulse/HCG Rate: 80  Temp: 98.1 F (36.7 C) Resp: 18 BP: 123/75 SpO2: 100 %  BMI: Estimated body mass index is 33.93 kg/m as calculated from the following:   Height as of this encounter: 4\' 7"  (1.397 m).   Weight as of this encounter: 146 lb (66.2 kg).  Risk Assessment: Allergies: Reviewed. She is allergic to hypafix [wound dressings], ativan [lorazepam], and tizanidine.  Allergy Precautions: None required Coagulopathies: Reviewed. None identified.  Blood-thinner therapy: None at this time Active Infection(s): Reviewed. None identified. [name] is afebrile  Site Confirmation: [name] was asked to confirm the procedure and laterality before marking the site Procedure checklist: Completed Consent: Before the procedure and under the influence of no sedative(s), amnesic(s), or anxiolytics, the patient was informed of the treatment options, risks and possible complications. To fulfill our ethical and legal obligations, as recommended by the American Medical Association's Code of Ethics, I have informed the patient of my clinical impression; the nature and purpose of the treatment or procedure; the risks, benefits, and possible complications of the intervention; the alternatives, including doing nothing; the risk(s) and benefit(s) of the alternative treatment(s) or procedure(s); and the risk(s) and benefit(s) of doing nothing. The patient was provided information about the general risks and possible complications associated with the procedure. These may include, but are not limited to: failure to achieve desired goals, infection, bleeding, organ or nerve damage, allergic reactions, paralysis, and death. In addition, the patient was informed of those risks and complications associated to the procedure, such as failure to decrease pain; infection; bleeding; organ or nerve  damage with subsequent damage to sensory, motor, and/or autonomic systems, resulting in permanent pain,  numbness, and/or weakness of one or several areas of the body; allergic reactions; (i.e.: anaphylactic reaction); and/or death. Furthermore, the patient was informed of those risks and complications associated with the medications. These include, but are not limited to: allergic reactions (i.e.: anaphylactic or anaphylactoid reaction(s)); adrenal axis suppression; blood sugar elevation that in diabetics may result in ketoacidosis or comma; water retention that in patients with history of congestive heart failure may result in shortness of breath, pulmonary edema, and decompensation with resultant heart failure; weight gain; swelling or edema; medication-induced neural toxicity; particulate matter embolism and blood vessel occlusion with resultant organ, and/or nervous system infarction; and/or aseptic necrosis of one or more joints. Finally, the patient was informed that Medicine is not an exact science; therefore, there is also the possibility of unforeseen or unpredictable risks and/or possible complications that may result in a catastrophic outcome. The patient indicated having understood very clearly. We have given the patient no guarantees and we have made no promises. Enough time was given to the patient to ask questions, all of which were answered to the patient's satisfaction. [name] has indicated that she wanted to continue with the procedure. Attestation: I, the ordering provider, attest that I have discussed with the patient the benefits, risks, side-effects, alternatives, likelihood of achieving goals, and potential problems during recovery for the procedure that I have provided informed consent. Date  Time: 05/02/2020 12:35 PM  Pre-Procedure Preparation:  Monitoring: As per clinic protocol. Respiration, ETCO2, SpO2, BP, heart rate and rhythm monitor placed and checked for adequate  function Safety Precautions: Patient was assessed for positional comfort and pressure points before starting the procedure. Time-out: I initiated and conducted the "Time-out" before starting the procedure, as per protocol. The patient was asked to participate by confirming the accuracy of the "Time Out" information. Verification of the correct person, site, and procedure were performed and confirmed by me, the nursing staff, and the patient. "Time-out" conducted as per Joint Commission's Universal Protocol (UP.01.01.01). Time: 1357  Description of Procedure:          Area Prepped: Entire Lower Lumbosacral Region DuraPrep (Iodine Povacrylex [0.7% available iodine] and Isopropyl Alcohol, 74% w/w) Safety Precautions: Aspiration looking for blood return was conducted prior to all injections. At no point did we inject any substances, as a needle was being advanced. No attempts were made at seeking any paresthesias. Safe injection practices and needle disposal techniques used. Medications properly checked for expiration dates. SDV (single dose vial) medications used. Description of the Procedure: Protocol guidelines were followed. The patient was placed in position over the fluoroscopy table. The target area was identified and the area prepped in the usual manner. Skin & deeper tissues infiltrated with local anesthetic. Appropriate amount of time allowed to pass for local anesthetics to take effect. The procedure needles were then advanced to the target area. Proper needle placement secured. Negative aspiration confirmed. Solution injected in intermittent fashion, asking for systemic symptoms every 0.5cc of injectate. The needles were then removed and the area cleansed, making sure to leave some of the prepping solution back to take advantage of its long term bactericidal properties.  Vitals:   05/02/20 1346 05/02/20 1356 05/02/20 1401 05/02/20 1409  BP: (!) 135/104 (!) 136/102 (!) 145/109 (!) 129/91  Pulse:  89 91 89 88  Resp: 16 18 16 18   Temp:      SpO2: 100% 99% 100% 100%  Weight:      Height:        Start  Time: 1357 hrs. End Time: 1404 hrs. Materials:  Needle(s) Type: Regular needle Gauge: 27G Length: 1.5-in Medication(s): Please see orders for medications and dosing details. Approximately 15 trigger point injected in the lower lumbar region with 0.5 to 1 cc of 0.2% ropivacaine with needling performed. Imaging Guidance:          Type of Imaging Technique: Fluoroscopy Guidance (Spinal) Indication(s): Assistance in needle guidance and placement for procedures requiring needle placement in or near specific anatomical locations not easily accessible without such assistance. Exposure Time: No patient exposure Contrast: None used. Fluoroscopic Guidance: I was personally present during the use of fluoroscopy. "Tunnel Vision Technique" used to obtain the best possible view of the target area. Parallax error corrected before commencing the procedure. "Direction-depth-direction" technique used to introduce the needle under continuous pulsed fluoroscopy. Once target was reached, antero-posterior, oblique, and lateral fluoroscopic projection used confirm needle placement in all planes. Images permanently stored in EMR. Ultrasound Guidance: N/A Interpretation: I personally interpreted the imaging intraoperatively. Adequate needle placement confirmed in multiple planes. Appropriate spread of contrast into desired area was observed. No evidence of afferent or efferent intravascular uptake. No intrathecal or subarachnoid spread observed. Permanent images saved into the patient's record.  Post-operative Assessment:  Post-procedure Vital Signs:  Pulse/HCG Rate: 88  Temp: 98.1 F (36.7 C) Resp: 18 BP: (!) 129/91 SpO2: 100 %  EBL: None  Complications: No immediate post-treatment complications observed by team, or reported by patient.  Note: The patient tolerated the entire procedure well. A repeat  set of vitals were taken after the procedure and the patient was kept under observation following institutional policy, for this type of procedure. Post-procedural neurological assessment was performed, showing return to baseline, prior to discharge. The patient was provided with post-procedure discharge instructions, including a section on how to identify potential problems. Should any problems arise concerning this procedure, the patient was given instructions to immediately contact us, at any time, without hesitation. In any case, we plan to contact the patient by telephone for a follow-up status report regarding this interventional procedure.  Comments:  No additional relevant information.  Plan of Care  Orders:  Orders Placed This Encounter  Procedures  . TRIGGER POINT INJECTION    Standing Status:   Standing    Number of Occurrences:   6    Standing Expiration Date:   05/02/2021    Scheduling Instructions:     Lumbar TPI    Order Specific Question:   Where will this procedure be performed?    Answer:   [facility]  . DG PAIN [facility] C-ARM 1-60 MIN NO REPORT    Intraoperative interpretation by procedural physician at [facility].    Standing Status:   Standing    Number of Occurrences:   1    Order Specific Question:   Reason for exam:    Answer:   Assistance in needle guidance and placement for procedures requiring needle placement in or near specific anatomical locations not easily accessible without such assistance.    Medications ordered for procedure: Meds ordered this encounter  Medications  . ropivacaine (PF) 2 mg/mL (0.2%) (NAROPIN) injection 9 mL  . ropivacaine (PF) 2 mg/mL (0.2%) (NAROPIN) injection 9 mL  . diazepam (VALIUM) tablet 5 mg   Medications administered: We administered ropivacaine (PF) 2 mg/mL (0.2%), ropivacaine (PF) 2 mg/mL (0.2%), and diazepam.  See the medical record for exact dosing, route, and time of administration.  Follow-up  plan:   Return for  Keep sch. appt for MM/ PRN Repeat TPI.       Pharmacological management options:  Opioid Analgesics: Butrans patch, side effects at 7.5 mcg an hour, analgesic benefit with no side effects at 5 mcg an hour, continue  Membrane stabilizer: Continue gabapentin as prescribed  Muscle relaxant: Continue Flexeril as prescribed  NSAID: To be determined at a later time  Other analgesic(s): To be determined at a later time   Interventional management options: Ms. Harbeck was informed that there is no guarantee that she would be a candidate for interventional therapies. The decision will be based on the results of diagnostic studies, as well as [name]'s risk profile.  Procedure(s) under consideration:  S/p left TPI 11/04/19 75% pain relief for 5 days. Left axillary peripheral nerve stimulation: 12/30/19 providing significant pain relief.  Removed 02/29/2020             Recent Visits Date Type Provider Dept  04/14/20 Office Visit Brianna Jolly, MD [facility]  02/29/20 Procedure visit Brianna Jolly, MD [facility]  Showing recent visits within past 90 days and meeting all other requirements Today's Visits Date Type Provider Dept  05/02/20 Procedure visit Brianna Jolly, MD [facility]  Showing today's visits and meeting all other requirements Future Appointments Date Type Provider Dept  07/21/20 Appointment Brianna Jolly, MD [facility]  Showing future appointments within next 90 days and meeting all other requirements  Disposition: Discharge home  Discharge (Date  Time): 05/02/2020; 1415 hrs.   Primary Care Physician: Brianna Arbour, MD Location: [facility] Note by: Brianna Jolly, MD Date: 05/02/2020; Time: 2:41 PM  Disclaimer:  Medicine is not an exact science. The only guarantee in medicine is that nothing is guaranteed. It is important to note that the decision to proceed with this intervention  was based on the information collected from the patient. The Data and conclusions were drawn from the patient's questionnaire, the interview, and the physical examination. Because the information was provided in large part by the patient, it cannot be guaranteed that it has not been purposely or unconsciously manipulated. Every effort has been made to obtain as much relevant data as possible for this evaluation. It is important to note that the conclusions that lead to this procedure are derived in large part from the available data. Always take into account that the treatment will also be dependent on availability of resources and existing treatment guidelines, considered by other Pain Management Practitioners as being common knowledge and practice, at the time of the intervention. For Medico-Legal purposes, it is also important to point out that variation in procedural techniques and pharmacological choices are the acceptable norm. The indications, contraindications, technique, and results of the above procedure should only be interpreted and judged by a Board-Certified Interventional Pain Specialist with extensive familiarity and expertise in the same exact procedure and technique.

## 2020-05-02 NOTE — Patient Instructions (Signed)
Pain Management Discharge Instructions  General Discharge Instructions :  If you need to reach your doctor call: Monday-Friday 8:00 am - 4:00 pm at [phone] or toll free [phone].  After clinic hours [phone] to have operator reach doctor.  Bring all of your medication bottles to all your appointments in the pain clinic.  To cancel or reschedule your appointment with Pain Management please remember to call 24 hours in advance to avoid a fee.  Refer to the educational materials which you have been given on: General Risks, I had my Procedure. Discharge Instructions, Post Sedation.  Post Procedure Instructions:  The drugs you were given will stay in your system until tomorrow, so for the next 24 hours you should not drive, make any legal decisions or drink any alcoholic beverages.  You may eat anything you prefer, but it is better to start with liquids then soups and crackers, and gradually work up to solid foods.  Please notify your doctor immediately if you have any unusual bleeding, trouble breathing or pain that is not related to your normal pain.  Depending on the type of procedure that was done, some parts of your body may feel week and/or numb.  This usually clears up by tonight or the next day.  Walk with the use of an assistive device or accompanied by an adult for the 24 hours.  You may use ice on the affected area for the first 24 hours.  Put ice in a Ziploc bag and cover with a towel and place against area 15 minutes on 15 minutes off.  You may switch to heat after 24 hours.Trigger Point Injection Trigger points are areas where you have pain. A trigger point injection is a shot given in the trigger point to help relieve pain for a few days to a few months. Common places for trigger points include:  The neck.  The shoulders.  The upper back.  The lower back. A trigger point injection will not cure long-term (chronic) pain permanently. These injections do not  always work for every person. For some people, they can help to relieve pain for a few days to a few months. Tell a health care provider about:  Any allergies you have.  All medicines you are taking, including vitamins, herbs, eye drops, creams, and over-the-counter medicines.  Any problems you or family members have had with anesthetic medicines.  Any blood disorders you have.  Any surgeries you have had.  Any medical conditions you have. What are the risks? Generally, this is a safe procedure. However, problems may occur, including:  Infection.  Bleeding or bruising.  Allergic reaction to the injected medicine.  Irritation of the skin around the injection site. What happens before the procedure? Ask your health care provider about:  Changing or stopping your regular medicines. This is especially important if you are taking diabetes medicines or blood thinners.  Taking medicines such as aspirin and ibuprofen. These medicines can thin your blood. Do not take these medicines unless your health care provider tells you to take them.  Taking over-the-counter medicines, vitamins, herbs, and supplements. What happens during the procedure?  Your health care provider will feel for trigger points. A marker may be used to circle the area for the injection.  The skin over the trigger point will be washed with a germ-killing (antiseptic) solution.  A thin needle is used for the injection. You may feel pain or a twitching feeling when the needle enters the trigger point.  A   numbing solution may be injected into the trigger point. Sometimes a medicine to keep down inflammation is also injected.  Your health care provider may move the needle around the area where the trigger point is located until the tightness and twitching goes away.  After the injection, your health care provider may put gentle pressure over the injection site.  The injection site will be covered with a bandage  (dressing). The procedure may vary among health care providers and hospitals.   What can I expect after treatment? After treatment, you may have:  Soreness and stiffness for 1-2 days.  A dressing. This can be taken off in a few hours or as told by your health care provider. Follow these instructions at home: Injection site care  Remove your dressing as told by your health care provider.  Check your injection site every day for signs of infection. Check for: ? Redness, swelling, or pain. ? Fluid or blood. ? Warmth. ? Pus or a bad smell. Managing pain, stiffness, and swelling  If directed, put ice on the affected area. ? Put ice in a plastic bag. ? Place a towel between your skin and the bag. ? Leave the ice on for 20 minutes, 2-3 times a day. General instructions  If you were asked to stop your regular medicines, ask your health care provider when you may start taking them again.  Return to your normal activities as told by your health care provider. Ask your health care provider what activities are safe for you.  Do not take baths, swim, or use a hot tub until your health care provider approves.  You may be asked to see an occupational or physical therapist for exercises that reduce muscle strain and stretch the area of the trigger point.  Keep all follow-up visits as told by your health care provider. This is important. Contact a health care provider if:  Your pain comes back, and it is worse than before the injection. You may need more injections.  You have chills or a fever.  The injection site becomes more painful, red, swollen, or warm to the touch. Summary  A trigger point injection is a shot given in the trigger point to help relieve pain for a few days to a few months.  Common places for trigger point injections are the neck, shoulder, upper back, and lower back.  These injections do not always work for every person, but for some people, the injections can help  to relieve pain for a few days to a few months.  Contact a health care provider if symptoms come back or they are worse than before treatment. Also, get help if the injection site becomes more painful, red, swollen, or warm to the touch. This information is not intended to replace advice given to you by your health care provider. Make sure you discuss any questions you have with your health care provider. Document Revised: 05/21/2018 Document Reviewed: 05/21/2018 Elsevier Patient Education  2021 [employer]  

## 2020-05-02 NOTE — Progress Notes (Signed)
Safety precautions to be maintained throughout the outpatient stay will include: orient to surroundings, keep bed in low position, maintain call bell within reach at all times, provide assistance with transfer out of bed and ambulation.  

## 2020-05-20 ENCOUNTER — Other Ambulatory Visit (HOSPITAL_COMMUNITY): Payer: Self-pay | Admitting: Internal Medicine

## 2020-05-20 ENCOUNTER — Other Ambulatory Visit: Payer: Self-pay | Admitting: Internal Medicine

## 2020-05-20 DIAGNOSIS — E041 Nontoxic single thyroid nodule: Secondary | ICD-10-CM

## 2020-05-26 ENCOUNTER — Ambulatory Visit
Admission: RE | Admit: 2020-05-26 | Discharge: 2020-05-26 | Disposition: A | Discharge status: Home / Self Care | Payer: Medicare HMO | Source: Ambulatory Visit | Attending: Internal Medicine | Admitting: Internal Medicine

## 2020-05-26 ENCOUNTER — Other Ambulatory Visit: Payer: Self-pay

## 2020-05-26 DIAGNOSIS — E041 Nontoxic single thyroid nodule: Secondary | ICD-10-CM | POA: Diagnosis present

## 2020-05-29 ENCOUNTER — Encounter: Payer: Self-pay | Admitting: Student in an Organized Health Care Education/Training Program

## 2020-07-21 ENCOUNTER — Ambulatory Visit
Admission: RE | Admit: 2020-07-21 | Discharge: 2020-07-21 | Disposition: A | Discharge status: Home / Self Care | Payer: Medicare HMO | Source: Ambulatory Visit | Attending: Student in an Organized Health Care Education/Training Program | Admitting: Student in an Organized Health Care Education/Training Program

## 2020-07-21 ENCOUNTER — Other Ambulatory Visit: Payer: Self-pay

## 2020-07-21 ENCOUNTER — Encounter: Payer: Self-pay | Admitting: Student in an Organized Health Care Education/Training Program

## 2020-07-21 ENCOUNTER — Ambulatory Visit (HOSPITAL_BASED_OUTPATIENT_CLINIC_OR_DEPARTMENT_OTHER): Payer: Medicare HMO | Admitting: Student in an Organized Health Care Education/Training Program

## 2020-07-21 VITALS — BP 146/106 | HR 80 | Temp 98.1°F | Resp 19 | Ht <= 58 in | Wt 148.0 lb

## 2020-07-21 DIAGNOSIS — M67912 Unspecified disorder of synovium and tendon, left shoulder: Secondary | ICD-10-CM

## 2020-07-21 DIAGNOSIS — Q7649 Other congenital malformations of spine, not associated with scoliosis: Secondary | ICD-10-CM | POA: Diagnosis present

## 2020-07-21 DIAGNOSIS — G894 Chronic pain syndrome: Secondary | ICD-10-CM | POA: Diagnosis present

## 2020-07-21 DIAGNOSIS — Z981 Arthrodesis status: Secondary | ICD-10-CM | POA: Diagnosis present

## 2020-07-21 DIAGNOSIS — G8929 Other chronic pain: Secondary | ICD-10-CM | POA: Diagnosis present

## 2020-07-21 DIAGNOSIS — M4105 Infantile idiopathic scoliosis, thoracolumbar region: Secondary | ICD-10-CM | POA: Diagnosis present

## 2020-07-21 DIAGNOSIS — M25512 Pain in left shoulder: Secondary | ICD-10-CM | POA: Insufficient documentation

## 2020-07-21 DIAGNOSIS — M7918 Myalgia, other site: Secondary | ICD-10-CM | POA: Diagnosis present

## 2020-07-21 DIAGNOSIS — Q766 Other congenital malformations of ribs: Secondary | ICD-10-CM | POA: Insufficient documentation

## 2020-07-21 MED ORDER — ROPIVACAINE HCL 2 MG/ML IJ SOLN
9.0000 mL | Freq: Once | INTRAMUSCULAR | Status: AC
  Administered 2020-07-21: 9 mL via PERINEURAL

## 2020-07-21 MED ORDER — DIAZEPAM 5 MG PO TABS
ORAL_TABLET | ORAL | Status: AC
  Filled 2020-07-21: qty 1

## 2020-07-21 MED ORDER — DIAZEPAM 5 MG PO TABS
5.0000 mg | ORAL_TABLET | Freq: Once | ORAL | Status: AC
  Administered 2020-07-21: 5 mg via ORAL

## 2020-07-21 MED ORDER — ROPIVACAINE HCL 2 MG/ML IJ SOLN
INTRAMUSCULAR | Status: AC
  Filled 2020-07-21: qty 20

## 2020-07-21 NOTE — Patient Instructions (Signed)
Pain Management Discharge Instructions  General Discharge Instructions :  If you need to reach your doctor call: Monday-Friday 8:00 am - 4:00 pm at [phone] or toll free [phone].  After clinic hours [phone] to have operator reach doctor.  Bring all of your medication bottles to all your appointments in the pain clinic.  To cancel or reschedule your appointment with Pain Management please remember to call 24 hours in advance to avoid a fee.  Refer to the educational materials which you have been given on: General Risks, I had my Procedure. Discharge Instructions, Post Sedation.  Post Procedure Instructions:  The drugs you were given will stay in your system until tomorrow, so for the next 24 hours you should not drive, make any legal decisions or drink any alcoholic beverages.  You may eat anything you prefer, but it is better to start with liquids then soups and crackers, and gradually work up to solid foods.  Please notify your doctor immediately if you have any unusual bleeding, trouble breathing or pain that is not related to your normal pain.  Depending on the type of procedure that was done, some parts of your body may feel week and/or numb.  This usually clears up by tonight or the next day.  Walk with the use of an assistive device or accompanied by an adult for the 24 hours.  You may use ice on the affected area for the first 24 hours.  Put ice in a Ziploc bag and cover with a towel and place against area 15 minutes on 15 minutes off.  You may switch to heat after 24 hours.Trigger Point Injection Trigger points are areas where you have pain. A trigger point injection is a shot given in the trigger point to help relieve pain for a few days to a few months. Common places for trigger points include:  The neck.  The shoulders.  The upper back.  The lower back. A trigger point injection will not cure long-term (chronic) pain permanently. These injections do not  always work for every person. For some people, they can help to relieve pain for a few days to a few months. Tell a health care provider about:  Any allergies you have.  All medicines you are taking, including vitamins, herbs, eye drops, creams, and over-the-counter medicines.  Any problems you or family members have had with anesthetic medicines.  Any blood disorders you have.  Any surgeries you have had.  Any medical conditions you have. What are the risks? Generally, this is a safe procedure. However, problems may occur, including:  Infection.  Bleeding or bruising.  Allergic reaction to the injected medicine.  Irritation of the skin around the injection site. What happens before the procedure? Ask your health care provider about:  Changing or stopping your regular medicines. This is especially important if you are taking diabetes medicines or blood thinners.  Taking medicines such as aspirin and ibuprofen. These medicines can thin your blood. Do not take these medicines unless your health care provider tells you to take them.  Taking over-the-counter medicines, vitamins, herbs, and supplements. What happens during the procedure?  Your health care provider will feel for trigger points. A marker may be used to circle the area for the injection.  The skin over the trigger point will be washed with a germ-killing (antiseptic) solution.  A thin needle is used for the injection. You may feel pain or a twitching feeling when the needle enters the trigger point.  A   numbing solution may be injected into the trigger point. Sometimes a medicine to keep down inflammation is also injected.  Your health care provider may move the needle around the area where the trigger point is located until the tightness and twitching goes away.  After the injection, your health care provider may put gentle pressure over the injection site.  The injection site will be covered with a bandage  (dressing). The procedure may vary among health care providers and hospitals.   What can I expect after treatment? After treatment, you may have:  Soreness and stiffness for 1-2 days.  A dressing. This can be taken off in a few hours or as told by your health care provider. Follow these instructions at home: Injection site care  Remove your dressing as told by your health care provider.  Check your injection site every day for signs of infection. Check for: ? Redness, swelling, or pain. ? Fluid or blood. ? Warmth. ? Pus or a bad smell. Managing pain, stiffness, and swelling  If directed, put ice on the affected area. ? Put ice in a plastic bag. ? Place a towel between your skin and the bag. ? Leave the ice on for 20 minutes, 2-3 times a day. General instructions  If you were asked to stop your regular medicines, ask your health care provider when you may start taking them again.  Return to your normal activities as told by your health care provider. Ask your health care provider what activities are safe for you.  Do not take baths, swim, or use a hot tub until your health care provider approves.  You may be asked to see an occupational or physical therapist for exercises that reduce muscle strain and stretch the area of the trigger point.  Keep all follow-up visits as told by your health care provider. This is important. Contact a health care provider if:  Your pain comes back, and it is worse than before the injection. You may need more injections.  You have chills or a fever.  The injection site becomes more painful, red, swollen, or warm to the touch. Summary  A trigger point injection is a shot given in the trigger point to help relieve pain for a few days to a few months.  Common places for trigger point injections are the neck, shoulder, upper back, and lower back.  These injections do not always work for every person, but for some people, the injections can help  to relieve pain for a few days to a few months.  Contact a health care provider if symptoms come back or they are worse than before treatment. Also, get help if the injection site becomes more painful, red, swollen, or warm to the touch. This information is not intended to replace advice given to you by your health care provider. Make sure you discuss any questions you have with your health care provider. Document Revised: 05/21/2018 Document Reviewed: 05/21/2018 Elsevier Patient Education  2021 [employer]  

## 2020-07-21 NOTE — Progress Notes (Signed)
1253 Dr. Cherylann Ratel aware of elevated blood pressures. No new orders given.

## 2020-07-21 NOTE — Progress Notes (Signed)
PROVIDER NOTE: Information contained herein reflects review and annotations entered in association with encounter. Interpretation of such information and data should be left to medically-trained personnel. Information provided to patient can be located elsewhere in the medical record under "Patient Instructions". Document created using STT-dictation technology, any transcriptional errors that may result from process are unintentional.    Patient: Brianna Arnold  Service Category: Procedure  Provider: Edward Jolly, MD  DOB: [birth year]  DOS: 07/21/2020  Location: [facility]  MRN: [id number]  Setting: Ambulatory - outpatient  Referring Provider: Marguarite Arbour, MD  Type: Established Patient  Specialty: Interventional Pain Management  PCP: Marguarite Arbour, MD   Primary Reason for Visit: Interventional Pain Management Treatment. CC: Back Pain  Procedure:          Anesthesia, Analgesia, Anxiolysis:  Type: Lumbar Paraspinal Muscle Trigger Point Injection (3+)         Quadratus lumborum, erector spinae, multifidus with fluoroscopy  CPT: 20553 Primary Purpose: Therapeutic Approach: Percutaneous, ipsilateral approach. Laterality: Midline        Type: Local Anesthesia with PO Valium for anxiety   Position: Prone   Indications: 1. Infantile idiopathic scoliosis of thoracolumbar region   2. Autosomal recessive spondylocostal dysostosis   3. Chronic pain syndrome   4. Chronic left shoulder pain   5. Disorder of left rotator cuff   6. Status post cervical spinal fusion   7. Myofascial pain syndrome of lumbar spine    Pain Score: Pre-procedure: 6 /10 Post-procedure: 4 /10   Pre-op H&P Assessment:  Ms. Seith is a 30 y.o. (year old), female patient, seen today for interventional treatment. She  has a past surgical history that includes Cervical spine surgery (07/2014); Back surgery (10/2014); and Anterior cervical decomp/discectomy fusion (N/A, 02/11/2019). Ms. Balles  has a current medication list which includes the following prescription(s): albuterol, buprenorphine, vitamin d3, cyclobenzaprine, ergocalciferol, famotidine, ferrous sulfate, gabapentin, medroxyprogesterone, promethazine, tramadol, trazodone, and vitamin b-12. Her primarily concern today is the Back Pain  Initial Vital Signs:  Pulse/HCG Rate: 80ECG Heart Rate: 84 Temp: 98.1 F (36.7 C) Resp: 18 BP: (!) 127/92 SpO2: 100 %  BMI: Estimated body mass index is 33.78 kg/m as calculated from the following:   Height as of this encounter: 4' 7.5" (1.41 m).   Weight as of this encounter: 148 lb (67.1 kg).  Risk Assessment: Allergies: Reviewed. She is allergic to hypafix [wound dressings], ativan [lorazepam], and tizanidine.  Allergy Precautions: None required Coagulopathies: Reviewed. None identified.  Blood-thinner therapy: None at this time Active Infection(s): Reviewed. None identified. Ms. Stierwalt is afebrile  Site Confirmation: Ms. Grudzien was asked to confirm the procedure and laterality before marking the site Procedure checklist: Completed Consent: Before the procedure and under the influence of no sedative(s), amnesic(s), or anxiolytics, the patient was informed of the treatment options, risks and possible complications. To fulfill our ethical and legal obligations, as recommended by the American Medical Association's Code of Ethics, I have informed the patient of my clinical impression; the nature and purpose of the treatment or procedure; the risks, benefits, and possible complications of the intervention; the alternatives, including doing nothing; the risk(s) and benefit(s) of the alternative treatment(s) or procedure(s); and the risk(s) and benefit(s) of doing nothing. The patient was provided information about the general risks and possible complications associated with the procedure. These may include, but are not limited to: failure to achieve desired goals, infection, bleeding, organ  or nerve damage, allergic reactions, paralysis, and death. In  addition, the patient was informed of those risks and complications associated to the procedure, such as failure to decrease pain; infection; bleeding; organ or nerve damage with subsequent damage to sensory, motor, and/or autonomic systems, resulting in permanent pain, numbness, and/or weakness of one or several areas of the body; allergic reactions; (i.e.: anaphylactic reaction); and/or death. Furthermore, the patient was informed of those risks and complications associated with the medications. These include, but are not limited to: allergic reactions (i.e.: anaphylactic or anaphylactoid reaction(s)); adrenal axis suppression; blood sugar elevation that in diabetics may result in ketoacidosis or comma; water retention that in patients with history of congestive heart failure may result in shortness of breath, pulmonary edema, and decompensation with resultant heart failure; weight gain; swelling or edema; medication-induced neural toxicity; particulate matter embolism and blood vessel occlusion with resultant organ, and/or nervous system infarction; and/or aseptic necrosis of one or more joints. Finally, the patient was informed that Medicine is not an exact science; therefore, there is also the possibility of unforeseen or unpredictable risks and/or possible complications that may result in a catastrophic outcome. The patient indicated having understood very clearly. We have given the patient no guarantees and we have made no promises. Enough time was given to the patient to ask questions, all of which were answered to the patient's satisfaction. Ms. Spelman has indicated that she wanted to continue with the procedure. Attestation: I, the ordering provider, attest that I have discussed with the patient the benefits, risks, side-effects, alternatives, likelihood of achieving goals, and potential problems during recovery for the procedure that I have  provided informed consent. Date  Time: 07/21/2020 10:47 AM  Pre-Procedure Preparation:  Monitoring: As per clinic protocol. Respiration, ETCO2, SpO2, BP, heart rate and rhythm monitor placed and checked for adequate function Safety Precautions: Patient was assessed for positional comfort and pressure points before starting the procedure. Time-out: I initiated and conducted the "Time-out" before starting the procedure, as per protocol. The patient was asked to participate by confirming the accuracy of the "Time Out" information. Verification of the correct person, site, and procedure were performed and confirmed by me, the nursing staff, and the patient. "Time-out" conducted as per Joint Commission's Universal Protocol (UP.01.01.01). Time: 1249  Description of Procedure:          Area Prepped: Entire Lower Lumbosacral Region DuraPrep (Iodine Povacrylex [0.7% available iodine] and Isopropyl Alcohol, 74% w/w) Safety Precautions: Aspiration looking for blood return was conducted prior to all injections. At no point did we inject any substances, as a needle was being advanced. No attempts were made at seeking any paresthesias. Safe injection practices and needle disposal techniques used. Medications properly checked for expiration dates. SDV (single dose vial) medications used. Description of the Procedure: Protocol guidelines were followed. The patient was placed in position over the fluoroscopy table. The target area was identified and the area prepped in the usual manner. Skin & deeper tissues infiltrated with local anesthetic. Appropriate amount of time allowed to pass for local anesthetics to take effect. The procedure needles were then advanced to the target area. Proper needle placement secured. Negative aspiration confirmed. Solution injected in intermittent fashion, asking for systemic symptoms every 0.5cc of injectate. The needles were then removed and the area cleansed, making sure to leave some  of the prepping solution back to take advantage of its long term bactericidal properties.  Vitals:   07/21/20 1052 07/21/20 1246 07/21/20 1252 07/21/20 1256  BP: (!) 127/92 (!) 135/102 (!) 141/111 (!) 146/106  Pulse: 80     Resp: 18 18 16 19   Temp: 98.1 F (36.7 C)     TempSrc: Temporal     SpO2: 100% 99% 100% 98%  Weight: 148 lb (67.1 kg)     Height: 4' 7.5" (1.41 m)       Start Time: 1249 hrs. End Time: 1256 hrs. Materials:  Needle(s) Type: Regular needle Gauge: 27G Length: 1.5-in Medication(s): Please see orders for medications and dosing details.  Approximately 20 trigger points injected in the lower lumbar region with 0.5 to 1 cc of 0.2% ropivacaine with needling performed. Another trigger point was injected under the right scapular region with dry needling performed  Imaging Guidance:          Type of Imaging Technique: Fluoroscopy Guidance (Spinal) Indication(s): Assistance in needle guidance and placement for procedures requiring needle placement in or near specific anatomical locations not easily accessible without such assistance. Exposure Time: No patient exposure Contrast: None used. Fluoroscopic Guidance: I was personally present during the use of fluoroscopy. "Tunnel Vision Technique" used to obtain the best possible view of the target area. Parallax error corrected before commencing the procedure. "Direction-depth-direction" technique used to introduce the needle under continuous pulsed fluoroscopy. Once target was reached, antero-posterior, oblique, and lateral fluoroscopic projection used confirm needle placement in all planes. Images permanently stored in EMR. Ultrasound Guidance: N/A Interpretation: I personally interpreted the imaging intraoperatively. Adequate needle placement confirmed in multiple planes. Appropriate spread of contrast into desired area was observed. No evidence of afferent or efferent intravascular uptake. No intrathecal or subarachnoid spread  observed. Permanent images saved into the patient's record.  Post-operative Assessment:  Post-procedure Vital Signs:  Pulse/HCG Rate: 8081 Temp: 98.1 F (36.7 C) Resp: 19 BP: (!) 146/106 SpO2: 98 %  EBL: None  Complications: No immediate post-treatment complications observed by team, or reported by patient.  Note: The patient tolerated the entire procedure well. A repeat set of vitals were taken after the procedure and the patient was kept under observation following institutional policy, for this type of procedure. Post-procedural neurological assessment was performed, showing return to baseline, prior to discharge. The patient was provided with post-procedure discharge instructions, including a section on how to identify potential problems. Should any problems arise concerning this procedure, the patient was given instructions to immediately contact , at any time, without hesitation. In any case, we plan to contact the patient by telephone for a follow-up status report regarding this interventional procedure.  Comments:  No additional relevant information.  Plan of Care  Orders:  Orders Placed This Encounter  Procedures  . TRIGGER POINT INJECTION    Standing Status:   Standing    Number of Occurrences:   6    Standing Expiration Date:   07/21/2021    Scheduling Instructions:     Area: Low back     Side: Bilateral     Sedation: Patient's choice.     TIMEFRAME: PRN procedure. (Ms. Twaddell will call when needed.)    Order Specific Question:   Where will this procedure be performed?    Answer:   [facility]  . DG [facility] C-ARM 1-60 MIN NO REPORT    Intraoperative interpretation by procedural physician at [facility].    Standing Status:   Standing    Number of Occurrences:   1    Order Specific Question:   Reason for exam:    Answer:   Assistance in needle guidance and placement for procedures  requiring needle placement in or near specific anatomical  locations not easily accessible without such assistance.    Medications ordered for procedure: Meds ordered this encounter  Medications  . ropivacaine (PF) 2 mg/mL (0.2%) (NAROPIN) injection 9 mL  . ropivacaine (PF) 2 mg/mL (0.2%) (NAROPIN) injection 9 mL  . diazepam (VALIUM) tablet 5 mg    Follow-up plan:   Return if symptoms worsen or fail to improve.       Pharmacological management options:  Opioid Analgesics: Butrans patch, side effects at 7.5 mcg an hour, analgesic benefit with no side effects at 5 mcg an hour, continue  Membrane stabilizer: Continue gabapentin as prescribed  Muscle relaxant: Continue Flexeril as prescribed  NSAID: To be determined at a later time  Other analgesic(s): To be determined at a later time   Interventional management options: [name] was informed that there is no guarantee that she would be a candidate for interventional therapies. The decision will be based on the results of diagnostic studies, as well as Ms. Rhames's risk profile.  Procedure(s) under consideration:  S/p left TPI 11/04/19 75% pain relief for 5 days. Left axillary peripheral nerve stimulation: 12/30/19 providing significant pain relief.  Removed 02/29/2020             Recent Visits Date Type Provider Dept  05/02/20 Procedure visit Edward JollyLateef, Laveyah Oriol, MD [facility]  Showing recent visits within past 90 days and meeting all other requirements Today's Visits Date Type Provider Dept  07/21/20 Office Visit Edward JollyLateef, Kaemon Barnett, MD [facility]  Showing today's visits and meeting all other requirements Future Appointments No visits were found meeting these conditions. Showing future appointments within next 90 days and meeting all other requirements  Disposition: Discharge home  Discharge (Date  Time): 07/21/2020; 1305 hrs.   Primary Care Physician: Marguarite ArbourSparks, Jeffrey D, MD [facility] Note by: Edward JollyBilal Gal Feldhaus, MD Date: 07/21/2020;  Time: 1:02 PM  Disclaimer:  Medicine is not an exact science. The only guarantee in medicine is that nothing is guaranteed. It is important to note that the decision to proceed with this intervention was based on the information collected from the patient. The Data and conclusions were drawn from the patient's questionnaire, the interview, and the physical examination. Because the information was provided in large part by the patient, it cannot be guaranteed that it has not been purposely or unconsciously manipulated. Every effort has been made to obtain as much relevant data as possible for this evaluation. It is important to note that the conclusions that lead to this procedure are derived in large part from the available data. Always take into account that the treatment will also be dependent on availability of resources and existing treatment guidelines, considered by other Pain Management Practitioners as being common knowledge and practice, at the time of the intervention. For Medico-Legal purposes, it is also important to point out that variation in procedural techniques and pharmacological choices are the acceptable norm. The indications, contraindications, technique, and results of the above procedure should only be interpreted and judged by a Board-Certified Interventional Pain Specialist with extensive familiarity and expertise in the same exact procedure and technique.

## 2020-07-21 NOTE — Progress Notes (Signed)
Safety precautions to be maintained throughout the outpatient stay will include: orient to surroundings, keep bed in low position, maintain call bell within reach at all times, provide assistance with transfer out of bed and ambulation.  

## 2020-08-08 ENCOUNTER — Other Ambulatory Visit: Payer: Self-pay

## 2020-08-08 ENCOUNTER — Encounter: Payer: Self-pay | Admitting: Student in an Organized Health Care Education/Training Program

## 2020-08-08 ENCOUNTER — Ambulatory Visit (HOSPITAL_BASED_OUTPATIENT_CLINIC_OR_DEPARTMENT_OTHER): Payer: Medicare HMO | Admitting: Student in an Organized Health Care Education/Training Program

## 2020-08-08 ENCOUNTER — Ambulatory Visit
Admission: RE | Admit: 2020-08-08 | Discharge: 2020-08-08 | Disposition: A | Discharge status: Home / Self Care | Payer: Medicare HMO | Source: Ambulatory Visit | Attending: Student in an Organized Health Care Education/Training Program | Admitting: Student in an Organized Health Care Education/Training Program

## 2020-08-08 VITALS — BP 133/96 | HR 98 | Temp 97.6°F | Resp 18 | Ht <= 58 in | Wt 150.0 lb

## 2020-08-08 DIAGNOSIS — G894 Chronic pain syndrome: Secondary | ICD-10-CM | POA: Diagnosis present

## 2020-08-08 DIAGNOSIS — M4105 Infantile idiopathic scoliosis, thoracolumbar region: Secondary | ICD-10-CM | POA: Insufficient documentation

## 2020-08-08 DIAGNOSIS — M7918 Myalgia, other site: Secondary | ICD-10-CM | POA: Insufficient documentation

## 2020-08-08 MED ORDER — ROPIVACAINE HCL 2 MG/ML IJ SOLN
9.0000 mL | Freq: Once | INTRAMUSCULAR | Status: AC
  Administered 2020-08-08: 9 mL via PERINEURAL
  Filled 2020-08-08: qty 10

## 2020-08-08 MED ORDER — DIAZEPAM 5 MG PO TABS
ORAL_TABLET | ORAL | Status: AC
  Filled 2020-08-08: qty 1

## 2020-08-08 MED ORDER — DIAZEPAM 5 MG PO TABS
5.0000 mg | ORAL_TABLET | Freq: Once | ORAL | Status: AC
  Administered 2020-08-08: 5 mg via ORAL

## 2020-08-08 NOTE — Progress Notes (Signed)
Safety precautions to be maintained throughout the outpatient stay will include: orient to surroundings, keep bed in low position, maintain call bell within reach at all times, provide assistance with transfer out of bed and ambulation.  

## 2020-08-08 NOTE — Progress Notes (Signed)
PROVIDER NOTE: Information contained herein reflects review and annotations entered in association with encounter. Interpretation of such information and data should be left to medically-trained personnel. Information provided to patient can be located elsewhere in the medical record under "Patient Instructions". Document created using STT-dictation technology, any transcriptional errors that may result from process are unintentional.    Patient: Brianna Arnold  Service Category: Procedure  Provider: Edward Jolly, MD  DOB: February 21, 1991  DOS: 08/08/2020  Location: [facility]  MRN: [id number]  Setting: Ambulatory - outpatient  Referring Provider: Marguarite Arbour, MD  Type: Established Patient  Specialty: Interventional Pain Management  PCP: Marguarite Arbour, MD   Primary Reason for Visit: Interventional Pain Management Treatment. CC: Back Pain (low) and Neck Pain (left)  Procedure:          Anesthesia, Analgesia, Anxiolysis:  Type: Lumbar Paraspinal Muscle Trigger Point Injection (3+)         Quadratus lumborum, erector spinae, multifidus and left trapzeius and periscapular region CPT: 20553 with fluoroscopy Primary Purpose: Therapeutic Approach: Percutaneous, ipsilateral approach. Laterality: Midline        Type: Local Anesthesia with PO Valium for anxiety   Position: Prone   Indications: 1. Infantile idiopathic scoliosis of thoracolumbar region   2. Myofascial pain syndrome of lumbar spine   3. Chronic pain syndrome    Pain Score: Pre-procedure: 6 /10 Post-procedure: 3 /10   Pre-op H&P Assessment:  Brianna Arnold is a 30 y.o. (year old), female patient, seen today for interventional treatment. She  has a past surgical history that includes Cervical spine surgery (07/2014); Back surgery (10/2014); and Anterior cervical decomp/discectomy fusion (N/A, 02/11/2019). Brianna Arnold has a current medication list which includes the following prescription(s): albuterol,  buprenorphine, vitamin d3, cyclobenzaprine, ergocalciferol, famotidine, ferrous sulfate, gabapentin, medroxyprogesterone, promethazine, tramadol, trazodone, and vitamin b-12. Her primarily concern today is the Back Pain (low) and Neck Pain (left)  Initial Vital Signs:  Pulse/HCG Rate: 92  Temp: 97.6 F (36.4 C) Resp: 18 BP: 115/88 SpO2: 100 %  BMI: Estimated body mass index is 34.86 kg/m as calculated from the following:   Height as of this encounter: 4\' 7"  (1.397 m).   Weight as of this encounter: 150 lb (68 kg).  Risk Assessment: Allergies: Reviewed. She is allergic to hypafix [wound dressings], ativan [lorazepam], and tizanidine.  Allergy Precautions: None required Coagulopathies: Reviewed. None identified.  Blood-thinner therapy: None at this time Active Infection(s): Reviewed. None identified. Brianna Arnold is afebrile  Site Confirmation: Brianna Arnold was asked to confirm the procedure and laterality before marking the site Procedure checklist: Completed Consent: Before the procedure and under the influence of no sedative(s), amnesic(s), or anxiolytics, the patient was informed of the treatment options, risks and possible complications. To fulfill our ethical and legal obligations, as recommended by the American Medical Association's Code of Ethics, I have informed the patient of my clinical impression; the nature and purpose of the treatment or procedure; the risks, benefits, and possible complications of the intervention; the alternatives, including doing nothing; the risk(s) and benefit(s) of the alternative treatment(s) or procedure(s); and the risk(s) and benefit(s) of doing nothing. The patient was provided information about the general risks and possible complications associated with the procedure. These may include, but are not limited to: failure to achieve desired goals, infection, bleeding, organ or nerve damage, allergic reactions, paralysis, and death. In addition, the patient  was informed of those risks and complications associated to the procedure, such as failure to  decrease pain; infection; bleeding; organ or nerve damage with subsequent damage to sensory, motor, and/or autonomic systems, resulting in permanent pain, numbness, and/or weakness of one or several areas of the body; allergic reactions; (i.e.: anaphylactic reaction); and/or death. Furthermore, the patient was informed of those risks and complications associated with the medications. These include, but are not limited to: allergic reactions (i.e.: anaphylactic or anaphylactoid reaction(s)); adrenal axis suppression; blood sugar elevation that in diabetics may result in ketoacidosis or comma; water retention that in patients with history of congestive heart failure may result in shortness of breath, pulmonary edema, and decompensation with resultant heart failure; weight gain; swelling or edema; medication-induced neural toxicity; particulate matter embolism and blood vessel occlusion with resultant organ, and/or nervous system infarction; and/or aseptic necrosis of one or more joints. Finally, the patient was informed that Medicine is not an exact science; therefore, there is also the possibility of unforeseen or unpredictable risks and/or possible complications that may result in a catastrophic outcome. The patient indicated having understood very clearly. We have given the patient no guarantees and we have made no promises. Enough time was given to the patient to ask questions, all of which were answered to the patient's satisfaction. Brianna Arnold has indicated that she wanted to continue with the procedure. Attestation: I, the ordering provider, attest that I have discussed with the patient the benefits, risks, side-effects, alternatives, likelihood of achieving goals, and potential problems during recovery for the procedure that I have provided informed consent. Date  Time: 08/08/2020  2:06 PM  Pre-Procedure  Preparation:  Monitoring: As per clinic protocol. Respiration, ETCO2, SpO2, BP, heart rate and rhythm monitor placed and checked for adequate function Safety Precautions: Patient was assessed for positional comfort and pressure points before starting the procedure. Time-out: I initiated and conducted the "Time-out" before starting the procedure, as per protocol. The patient was asked to participate by confirming the accuracy of the "Time Out" information. Verification of the correct person, site, and procedure were performed and confirmed by me, the nursing staff, and the patient. "Time-out" conducted as per Joint Commission's Universal Protocol (UP.01.01.01). Time: 1447  Description of Procedure:          Area Prepped: Entire Lower Lumbosacral Region DuraPrep (Iodine Povacrylex [0.7% available iodine] and Isopropyl Alcohol, 74% w/w) Safety Precautions: Aspiration looking for blood return was conducted prior to all injections. At no point did we inject any substances, as a needle was being advanced. No attempts were made at seeking any paresthesias. Safe injection practices and needle disposal techniques used. Medications properly checked for expiration dates. SDV (single dose vial) medications used. Description of the Procedure: Protocol guidelines were followed. The patient was placed in position over the fluoroscopy table. The target area was identified and the area prepped in the usual manner. Skin & deeper tissues infiltrated with local anesthetic. Appropriate amount of time allowed to pass for local anesthetics to take effect. The procedure needles were then advanced to the target area. Proper needle placement secured. Negative aspiration confirmed. Solution injected in intermittent fashion, asking for systemic symptoms every 0.5cc of injectate. The needles were then removed and the area cleansed, making sure to leave some of the prepping solution back to take advantage of its long term bactericidal  properties.  Vitals:   08/08/20 1418 08/08/20 1419 08/08/20 1448 08/08/20 1453  BP: 115/88  (!) 155/101 (!) 133/96  Pulse: 92 93 93 98  Resp: 18 20 20 18   Temp: 97.6 F (36.4 C)  TempSrc: Temporal     SpO2: 100% 100% 100% 100%  Weight: 150 lb (68 kg)     Height: 4\' 7"  (1.397 m)       Start Time: 1447 hrs. End Time: 1453 hrs. Materials:  Needle(s) Type: Regular needle Gauge: 27G Length: 1.5-in Medication(s): Please see orders for medications and dosing details. Approximately 15 trigger point injected in the lower lumbar region with 0.5 to 1 cc of 0.2% ropivacaine with needling performed.  Approximately 5 trigger points were also injected in the left periscapular, left trapezius region with dry needling performed.  Each trigger point was injected with 0.5 to 1 cc of 0.2% ropivacaine. Imaging Guidance:          Type of Imaging Technique: Fluoroscopy Guidance (Spinal) Indication(s): Assistance in needle guidance and placement for procedures requiring needle placement in or near specific anatomical locations not easily accessible without such assistance. Exposure Time: No patient exposure Contrast: None used. Fluoroscopic Guidance: I was personally present during the use of fluoroscopy. "Tunnel Vision Technique" used to obtain the best possible view of the target area. Parallax error corrected before commencing the procedure. "Direction-depth-direction" technique used to introduce the needle under continuous pulsed fluoroscopy. Once target was reached, antero-posterior, oblique, and lateral fluoroscopic projection used confirm needle placement in all planes. Images permanently stored in EMR. Ultrasound Guidance: N/A Interpretation: I personally interpreted the imaging intraoperatively. Adequate needle placement confirmed in multiple planes. Appropriate spread of contrast into desired area was observed. No evidence of afferent or efferent intravascular uptake. No intrathecal or  subarachnoid spread observed. Permanent images saved into the patient's record.  Post-operative Assessment:  Post-procedure Vital Signs:  Pulse/HCG Rate: 98  Temp: 97.6 F (36.4 C) Resp: 18 BP: (!) 133/96 SpO2: 100 %  EBL: None  Complications: No immediate post-treatment complications observed by team, or reported by patient.  Note: The patient tolerated the entire procedure well. A repeat set of vitals were taken after the procedure and the patient was kept under observation following institutional policy, for this type of procedure. Post-procedural neurological assessment was performed, showing return to baseline, prior to discharge. The patient was provided with post-procedure discharge instructions, including a section on how to identify potential problems. Should any problems arise concerning this procedure, the patient was given instructions to immediately contact , at any time, without hesitation. In any case, we plan to contact the patient by telephone for a follow-up status report regarding this interventional procedure.  Comments:  No additional relevant information.  Plan of Care  Orders:  Orders Placed This Encounter  Procedures  . TRIGGER POINT INJECTION    Standing Status:   Standing    Number of Occurrences:   6    Standing Expiration Date:   08/08/2021    Scheduling Instructions:     Lumbar paraspinal (with fluoro)     Sedation: Patient's choice.     TIMEFRAME: PRN procedure. (Brianna Arnold will call when needed.)    Order Specific Question:   Where will this procedure be performed?    Answer:   [facility]  . [facility] C-ARM 1-60 MIN NO REPORT    Intraoperative interpretation by procedural physician at [facility].    Standing Status:   Standing    Number of Occurrences:   1    Order Specific Question:   Reason for exam:    Answer:   Assistance in needle guidance and placement for procedures requiring needle placement in or near specific  anatomical locations not easily accessible without such assistance.    Medications ordered for procedure: Meds ordered this encounter  Medications  . ropivacaine (PF) 2 mg/mL (0.2%) (NAROPIN) injection 9 mL  . ropivacaine (PF) 2 mg/mL (0.2%) (NAROPIN) injection 9 mL  . diazepam (VALIUM) tablet 5 mg   Medications administered: We administered ropivacaine (PF) 2 mg/mL (0.2%), ropivacaine (PF) 2 mg/mL (0.2%), and diazepam.  See the medical record for exact dosing, route, and time of administration.  Follow-up plan:   Return in about 31 days (around 09/08/2020) for Medication Management, in person.       Pharmacological management options:  Opioid Analgesics: Butrans patch, side effects at 7.5 mcg an hour, analgesic benefit with no side effects at 5 mcg an hour, continue  Membrane stabilizer: Continue gabapentin as prescribed  Muscle relaxant: Continue Flexeril as prescribed  NSAID: To be determined at a later time  Other analgesic(s): To be determined at a later time   Interventional management options: Brianna Arnold was informed that there is no guarantee that she would be a candidate for interventional therapies. The decision will be based on the results of diagnostic studies, as well as Brianna Arnold's risk profile.  Procedure(s) under consideration:  Left axillary peripheral nerve stimulation: 12/30/19 providing significant pain relief.  Removed 02/29/2020             Recent Visits Date Type Provider Dept  07/21/20 Office Visit Edward JollyLateef, Oluwatomiwa Kinyon, MD [facility]  Showing recent visits within past 90 days and meeting all other requirements Today's Visits Date Type Provider Dept  08/08/20 Procedure visit Edward JollyLateef, Seynabou Fults, MD [facility]  Showing today's visits and meeting all other requirements Future Appointments No visits were found meeting these conditions. Showing future appointments within next 90 days and meeting all other requirements  Disposition: Discharge home   Discharge (Date  Time): 08/08/2020; 1505 hrs.   Primary Care Physician: Marguarite ArbourSparks, Jeffrey D, MD [facility] Note by: Edward JollyBilal Farris Blash, MD Date: 08/08/2020; Time: 2:58 PM  Disclaimer:  Medicine is not an exact science. The only guarantee in medicine is that nothing is guaranteed. It is important to note that the decision to proceed with this intervention was based on the information collected from the patient. The Data and conclusions were drawn from the patient's questionnaire, the interview, and the physical examination. Because the information was provided in large part by the patient, it cannot be guaranteed that it has not been purposely or unconsciously manipulated. Every effort has been made to obtain as much relevant data as possible for this evaluation. It is important to note that the conclusions that lead to this procedure are derived in large part from the available data. Always take into account that the treatment will also be dependent on availability of resources and existing treatment guidelines, considered by other Pain Management Practitioners as being common knowledge and practice, at the time of the intervention. For Medico-Legal purposes, it is also important to point out that variation in procedural techniques and pharmacological choices are the acceptable norm. The indications, contraindications, technique, and results of the above procedure should only be interpreted and judged by a Board-Certified Interventional Pain Specialist with extensive familiarity and expertise in the same exact procedure and technique.

## 2020-08-08 NOTE — Patient Instructions (Signed)
Pain Management Discharge Instructions  General Discharge Instructions :  If you need to reach your doctor call: Monday-Friday 8:00 am - 4:00 pm at [phone] or toll free [phone].  After clinic hours [phone] to have operator reach doctor.  Bring all of your medication bottles to all your appointments in the pain clinic.  To cancel or reschedule your appointment with Pain Management please remember to call 24 hours in advance to avoid a fee.  Refer to the educational materials which you have been given on: General Risks, I had my Procedure. Discharge Instructions, Post Sedation.  Post Procedure Instructions:  The drugs you were given will stay in your system until tomorrow, so for the next 24 hours you should not drive, make any legal decisions or drink any alcoholic beverages.  You may eat anything you prefer, but it is better to start with liquids then soups and crackers, and gradually work up to solid foods.  Please notify your doctor immediately if you have any unusual bleeding, trouble breathing or pain that is not related to your normal pain.  Depending on the type of procedure that was done, some parts of your body may feel week and/or numb.  This usually clears up by tonight or the next day.  Walk with the use of an assistive device or accompanied by an adult for the 24 hours.  You may use ice on the affected area for the first 24 hours.  Put ice in a Ziploc bag and cover with a towel and place against area 15 minutes on 15 minutes off.  You may switch to heat after 24 hours.Trigger Point Injection Trigger points are areas where you have pain. A trigger point injection is a shot given in the trigger point to help relieve pain for a few days to a few months. Common places for trigger points include:  The neck.  The shoulders.  The upper back.  The lower back. A trigger point injection will not cure long-term (chronic) pain permanently. These injections do not  always work for every person. For some people, they can help to relieve pain for a few days to a few months. Tell a health care provider about:  Any allergies you have.  All medicines you are taking, including vitamins, herbs, eye drops, creams, and over-the-counter medicines.  Any problems you or family members have had with anesthetic medicines.  Any blood disorders you have.  Any surgeries you have had.  Any medical conditions you have. What are the risks? Generally, this is a safe procedure. However, problems may occur, including:  Infection.  Bleeding or bruising.  Allergic reaction to the injected medicine.  Irritation of the skin around the injection site. What happens before the procedure? Ask your health care provider about:  Changing or stopping your regular medicines. This is especially important if you are taking diabetes medicines or blood thinners.  Taking medicines such as aspirin and ibuprofen. These medicines can thin your blood. Do not take these medicines unless your health care provider tells you to take them.  Taking over-the-counter medicines, vitamins, herbs, and supplements. What happens during the procedure?  Your health care provider will feel for trigger points. A marker may be used to circle the area for the injection.  The skin over the trigger point will be washed with a germ-killing (antiseptic) solution.  A thin needle is used for the injection. You may feel pain or a twitching feeling when the needle enters the trigger point.  A   numbing solution may be injected into the trigger point. Sometimes a medicine to keep down inflammation is also injected.  Your health care provider may move the needle around the area where the trigger point is located until the tightness and twitching goes away.  After the injection, your health care provider may put gentle pressure over the injection site.  The injection site will be covered with a bandage  (dressing). The procedure may vary among health care providers and hospitals.   What can I expect after treatment? After treatment, you may have:  Soreness and stiffness for 1-2 days.  A dressing. This can be taken off in a few hours or as told by your health care provider. Follow these instructions at home: Injection site care  Remove your dressing as told by your health care provider.  Check your injection site every day for signs of infection. Check for: ? Redness, swelling, or pain. ? Fluid or blood. ? Warmth. ? Pus or a bad smell. Managing pain, stiffness, and swelling  If directed, put ice on the affected area. ? Put ice in a plastic bag. ? Place a towel between your skin and the bag. ? Leave the ice on for 20 minutes, 2-3 times a day. General instructions  If you were asked to stop your regular medicines, ask your health care provider when you may start taking them again.  Return to your normal activities as told by your health care provider. Ask your health care provider what activities are safe for you.  Do not take baths, swim, or use a hot tub until your health care provider approves.  You may be asked to see an occupational or physical therapist for exercises that reduce muscle strain and stretch the area of the trigger point.  Keep all follow-up visits as told by your health care provider. This is important. Contact a health care provider if:  Your pain comes back, and it is worse than before the injection. You may need more injections.  You have chills or a fever.  The injection site becomes more painful, red, swollen, or warm to the touch. Summary  A trigger point injection is a shot given in the trigger point to help relieve pain for a few days to a few months.  Common places for trigger point injections are the neck, shoulder, upper back, and lower back.  These injections do not always work for every person, but for some people, the injections can help  to relieve pain for a few days to a few months.  Contact a health care provider if symptoms come back or they are worse than before treatment. Also, get help if the injection site becomes more painful, red, swollen, or warm to the touch. This information is not intended to replace advice given to you by your health care provider. Make sure you discuss any questions you have with your health care provider. Document Revised: 05/21/2018 Document Reviewed: 05/21/2018 Elsevier Patient Education  2021 [employer]  

## 2020-08-09 ENCOUNTER — Telehealth: Payer: Self-pay | Admitting: *Deleted

## 2020-08-09 NOTE — Telephone Encounter (Signed)
States doing well and her neck area is especially doing better.

## 2020-08-25 ENCOUNTER — Other Ambulatory Visit: Payer: Self-pay | Admitting: Neurology

## 2020-08-25 DIAGNOSIS — R4189 Other symptoms and signs involving cognitive functions and awareness: Secondary | ICD-10-CM

## 2020-09-02 ENCOUNTER — Ambulatory Visit
Admission: RE | Admit: 2020-09-02 | Discharge: 2020-09-02 | Disposition: A | Discharge status: Home / Self Care | Payer: Medicare HMO | Source: Ambulatory Visit | Attending: Neurology | Admitting: Neurology

## 2020-09-02 ENCOUNTER — Other Ambulatory Visit: Payer: Self-pay

## 2020-09-02 DIAGNOSIS — R4189 Other symptoms and signs involving cognitive functions and awareness: Secondary | ICD-10-CM | POA: Diagnosis present

## 2020-09-02 MED ORDER — GADOBUTROL 1 MMOL/ML IV SOLN
7.0000 mL | Freq: Once | INTRAVENOUS | Status: AC | PRN
  Administered 2020-09-02: 7 mL via INTRAVENOUS

## 2020-09-08 ENCOUNTER — Encounter: Payer: Self-pay | Admitting: Student in an Organized Health Care Education/Training Program

## 2020-09-08 ENCOUNTER — Ambulatory Visit
Payer: Medicare HMO | Attending: Student in an Organized Health Care Education/Training Program | Admitting: Student in an Organized Health Care Education/Training Program

## 2020-09-08 ENCOUNTER — Other Ambulatory Visit: Payer: Self-pay

## 2020-09-08 VITALS — BP 127/91 | HR 82 | Temp 98.7°F | Resp 16 | Ht <= 58 in | Wt 150.0 lb

## 2020-09-08 DIAGNOSIS — M7918 Myalgia, other site: Secondary | ICD-10-CM | POA: Insufficient documentation

## 2020-09-08 DIAGNOSIS — M25512 Pain in left shoulder: Secondary | ICD-10-CM | POA: Insufficient documentation

## 2020-09-08 DIAGNOSIS — M4105 Infantile idiopathic scoliosis, thoracolumbar region: Secondary | ICD-10-CM | POA: Diagnosis present

## 2020-09-08 DIAGNOSIS — M542 Cervicalgia: Secondary | ICD-10-CM | POA: Diagnosis present

## 2020-09-08 DIAGNOSIS — G8929 Other chronic pain: Secondary | ICD-10-CM | POA: Insufficient documentation

## 2020-09-08 DIAGNOSIS — G894 Chronic pain syndrome: Secondary | ICD-10-CM | POA: Insufficient documentation

## 2020-09-08 DIAGNOSIS — M67912 Unspecified disorder of synovium and tendon, left shoulder: Secondary | ICD-10-CM | POA: Diagnosis present

## 2020-09-08 MED ORDER — BUPRENORPHINE 5 MCG/HR TD PTWK: 1.0000 | MEDICATED_PATCH | TRANSDERMAL | 2 refills | Status: DC

## 2020-09-08 NOTE — Progress Notes (Signed)
Nursing Pain Medication Assessment:  Safety precautions to be maintained throughout the outpatient stay will include: orient to surroundings, keep bed in low position, maintain call bell within reach at all times, provide assistance with transfer out of bed and ambulation.  Medication Inspection Compliance: Pill count conducted under aseptic conditions, in front of the patient. Neither the pills nor the bottle was removed from the patient's sight at any time. Once count was completed pills were immediately returned to the patient in their original bottle.  Medication: butrans patch Pill/Patch Count: 0 of 4 pills remain Pill/Patch Appearance: none to count Bottle Appearance: Standard pharmacy container. Clearly labeled. Filled Date: 04 / 26 / 2022 Last Medication intake:  Yesterday

## 2020-09-08 NOTE — Progress Notes (Signed)
PROVIDER NOTE: Information contained herein reflects review and annotations entered in association with encounter. Interpretation of such information and data should be left to medically-trained personnel. Information provided to patient can be located elsewhere in the medical record under "Patient Instructions". Document created using STT-dictation technology, any transcriptional errors that may result from process are unintentional.    Patient: Brianna Arnold  Service Category: E/M  Provider: Gillis Santa, MD  DOB: [birth year]  DOS: 09/08/2020  Specialty: Interventional Pain Management  MRN: [id number]  Setting: Ambulatory outpatient  PCP: Idelle Crouch, MD  Type: Established Patient    Referring Provider: Idelle Crouch, MD  Location: Office  Delivery: Face-to-face     HPI  Brianna Arnold, a 30 y.o. year old female, is here today because of her Infantile idiopathic scoliosis of thoracolumbar region [M41.05]. Ms. Sebald's primary complain today is Neck Pain and Back Pain (low) Last encounter: My last encounter with her was on 02/29/2020. Pertinent problems: [name] has Status post cervical spinal fusion; Migraine without aura and without status migrainosus, not intractable; History of lumbar spinal fusion (T11- iliac); Cervicalgia; Chronic left shoulder pain; Disorder of left rotator cuff; and Chronic pain syndrome on their pertinent problem list. Pain Assessment: Severity of Chronic pain is reported as a 5 /10. Location: Back Lower (neck pain as well)/ . Onset: More than a month ago. Quality: Constant,Stabbing,Throbbing (low back is deep pain like a belt type coverage). Timing: Constant. Modifying factor(s): medications, erst, heat ,ice. Vitals:  height is '4\' 7"'  (1.397 m) and weight is 150 lb (68 kg). Her temporal temperature is 98.7 F (37.1 C). Her blood pressure is 127/91 (abnormal) and her pulse is 82. Her respiration is 16 and oxygen saturation is 100%.   Reason  for encounter: medication management.    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  Patient is going to see Dr Cari Caraway soon Unfortunately  She is not a candidate for percutaneous SCS trial given extension fusion and scoliosis We will refill of Butrans patch as below   Pharmacotherapy Assessment   Analgesic: Butrans patch 5 mcg/hr  Monitoring: [place] PMP: PDMP reviewed during this encounter.       Pharmacotherapy: No side-effects or adverse reactions reported. Compliance: No problems identified. Effectiveness: Clinically acceptable.  Hart Rochester, RN  09/08/2020  2:50 PM  Sign when Signing Visit Nursing Pain Medication Assessment:  Safety precautions to be maintained throughout the outpatient stay will include: orient to surroundings, keep bed in low position, maintain call bell within reach at all times, provide assistance with transfer out of bed and ambulation.  Medication Inspection Compliance: Pill count conducted under aseptic conditions, in front of the patient. Neither the pills nor the bottle was removed from the patient's sight at any time. Once count was completed pills were immediately returned to the patient in their original bottle.  Medication: butrans patch Pill/Patch Count: 0 of 4 pills remain Pill/Patch Appearance: none to count Bottle Appearance: Standard pharmacy container. Clearly labeled. Filled Date: 04 / 26 / 2022 Last Medication intake:  Yesterday    UDS:  Summary  Date Value Ref Range Status  10/15/2019 Note  Final    Comment:    ==================================================================== Compliance Drug Analysis, Ur ==================================================================== Test  Result       Flag       Units  Drug Absent but Declared for  Prescription Verification   Buprenorphine                  Not Detected UNEXPECTED ng/mg creat    Transdermal buprenorphine, as indicated in the declared medication    list, is not always detected even when used as directed.    Tramadol                       Not Detected UNEXPECTED ng/mg creat   Gabapentin                     Not Detected UNEXPECTED   Cyclobenzaprine                Not Detected UNEXPECTED   Trazodone                      Not Detected UNEXPECTED   Promethazine                   Not Detected UNEXPECTED ==================================================================== Test                      Result    Flag   Units      Ref Range   Creatinine              91               mg/dL      >=20 ==================================================================== Declared Medications:  The flagging and interpretation on this report are based on the  following declared medications.  Unexpected results may arise from  inaccuracies in the declared medications.   **Note: The testing scope of this panel includes these medications:   Cyclobenzaprine (Flexeril)  Gabapentin (Neurontin)  Promethazine (Phenergan)  Tramadol (Ultram)  Trazodone (Desyrel)   **Note: The testing scope of this panel does not include small to  moderate amounts of these reported medications:   Buprenorphine Patch (BuTrans)   **Note: The testing scope of this panel does not include the  following reported medications:   Albuterol (Ventolin HFA)  Ethinyl Estradiol (Sprintec)  Famotidine (Pepcid)  Iron  Norgestimate (Sprintec)  Vitamin B12  Vitamin D3 ==================================================================== For clinical consultation, please call [phone]. ====================================================================      ROS  Constitutional: Denies any fever or chills Gastrointestinal: No reported hemesis, hematochezia, vomiting, or acute GI distress Musculoskeletal:  Left shoulder pain, bilateral low back pain Neurological: No reported episodes of acute onset apraxia, aphasia, dysarthria, agnosia, amnesia, paralysis, loss of coordination, or loss of consciousness  Medication Review  Vitamin D3, albuterol, buprenorphine, butalbital-acetaminophen-caffeine, cyclobenzaprine, ergocalciferol, famotidine, ferrous sulfate, gabapentin, medroxyPROGESTERone, promethazine, traMADol, traZODone, and vitamin B-12  History Review  Allergy: Ms. Dismuke is allergic to hypafix [wound dressings], ativan [lorazepam], and tizanidine. Drug: Ms. Cid  reports no history of drug use. Alcohol:  has no history on file for alcohol use. Tobacco:  reports that she has never smoked. She has never used smokeless tobacco. Social: Ms. [place]  reports that she has never smoked. She has never used smokeless tobacco. She reports that she does not use drugs. Medical:  has a past medical history of Anemia and Jarcho-Levin syndrome. Surgical: Ms. Jurado  has a past surgical history that includes Cervical spine surgery (07/2014); Back surgery (10/2014); and Anterior cervical decomp/discectomy fusion (N/A, 02/11/2019). Family: family  history includes Hypertension in her father.  Laboratory Chemistry Profile   Renal Lab Results  Component Value Date   BUN 6 02/15/2019   CREATININE 0.63 02/15/2019   GFRAA >60 02/15/2019   GFRNONAA >60 02/15/2019     Hepatic Lab Results  Component Value Date   AST 17 02/14/2019   ALT 12 02/14/2019   ALBUMIN 3.9 02/14/2019   ALKPHOS 55 02/14/2019   LIPASE 18 02/14/2019     Electrolytes Lab Results  Component Value Date   NA 137 02/15/2019   K 4.1 02/15/2019   CL 106 02/15/2019   CALCIUM 8.1 (L) 02/15/2019     Bone No results found for: VD25OH, [id number], DG6440HK7, QQ5956LO7, 25OHVITD1, 25OHVITD2, 25OHVITD3, TESTOFREE, TESTOSTERONE   Inflammation (CRP: Acute Phase) (ESR: Chronic Phase) Lab Results  Component Value Date   LATICACIDVEN 1.6  04/19/2015       Note: Above Lab results reviewed.  Recent Imaging Review  MR BRAIN W WO CONTRAST CLINICAL DATA:  Headache which short-term memory loss.  EXAM: MRI HEAD WITHOUT AND WITH CONTRAST  TECHNIQUE: Multiplanar, multiecho pulse sequences of the brain and surrounding structures were obtained without and with intravenous contrast.  CONTRAST:  75m GADAVIST GADOBUTROL 1 MMOL/ML IV SOLN  COMPARISON:  CT of the cervical spine 12/05/2018. MRI cervical spine 12/29/2013. CT head 04/19/2015.  FINDINGS: Brain: No acute infarction, hemorrhage, hydrocephalus, extra-axial collection or mass lesion. No abnormal enhancement.  Vascular: Poorly visualized left vertebral artery flow void, favored chronic given small left vertebral artery flow void on prior MRI cervical spine. Otherwise, major arterial flow voids are maintained.  Skull and upper cervical spine: Partially imaged occipital and cervical hardware fusion and extensive abnormalities of segmentation, better characterized on prior CT of the cervical spine. Nonexpansile T2 hyperintense signal within the cord at the craniocervical junction which likely represents myelomalacia given findings on remote MRI from 01/08/2014. Mild to moderate canal stenosis at the craniocervical junction with bone contacting and flattening the left ventral cord.  Sinuses/Orbits: Mild maxillary sinus mucosal thickening without air-fluid levels. Unremarkable orbits.  Other: Trace bilateral mastoid effusions.  IMPRESSION: 1. No evidence of acute intracranial abnormality. 2. Partially imaged occipital and cervical hardware fusion and extensive abnormalities of segmentation, better characterized on prior CT of the cervical spine. Nonexpansile T2 hyperintense signal within the cord at the craniocervical junction likely represents myelomalacia given findings on remote MRI from 01/08/2014. Mild to moderate canal stenosis at the craniocervical junction  with bone contacting and flattening the left ventral cord. 3. Poorly visualized left vertebral artery flow void, favored chronic given small left vertebral artery flow void on prior MRI cervical spine.  Electronically Signed   By: FMargaretha SheffieldMD   On: 09/03/2020 11:44 Note: Reviewed        Physical Exam  General appearance: Well nourished, well developed, and well hydrated. In no apparent acute distress Mental status: Alert, oriented x 3 (person, place, & time)       Respiratory: No evidence of acute respiratory distress Eyes: [name] Vitals: BP (!) 127/91 (BP Location: Right Arm, Patient Position: Sitting, Cuff Size: Normal)   Pulse 82   Temp 98.7 F (37.1 C) (Temporal)   Resp 16   Ht '4\' 7"'  (1.397 m)   Wt 150 lb (68 kg)   SpO2 100%   BMI 34.86 kg/m  BMI: Estimated body mass index is 34.86 kg/m as calculated from the following:   Height as of this encounter: '4\' 7"'  (1.397 m).   Weight as  of this encounter: 150 lb (68 kg). Ideal: Patient must be at least 60 in tall to calculate ideal body weight  Cervical Spine Exam  Skin & Axial Inspection:Well healed scar from previous spine surgery detected Alignment:Asymmetric Functional MWU:XLKG restricted ROM Stability:No instability detected Muscle Tone/Strength:Functionally intact. No obvious neuro-muscular anomalies detected. Sensory (Neurological):Neuropathic pain pattern Palpation:No palpable anomalies        Upper Extremity (UE) Exam    Side:Right upper extremity  Side:Left upper extremity   Skin & Extremity Inspection:Skin color, temperature, and hair growth are WNL. No peripheral edema or cyanosis. No masses, redness, swelling, asymmetry, or associated skin lesions. No contractures.  Skin & Extremity Inspection: Axillary nerve peripheral nerve stimulator in place, insertion site not erythematous, nonindurated.   Functional MWN:UUVOZDGUYQIH ROM  Functional KVQ:QVZDGLOVF  ROMfor all joints of upper extremity however improved since axillary nerve peripheral nerve stimulation   Muscle Tone/Strength:Functionally intact. No obvious neuro-muscular anomalies detected.  Muscle Tone/Strength:Functionally intact. No obvious neuro-muscular anomalies detected.   Sensory (Neurological):Dermatomal pain pattern  Sensory (Neurological):Dermatomal pain pattern   Palpation:No palpable anomalies  Palpation:No palpable anomalies   Provocative Test(s): Phalen's test:deferred Tinel's test:deferred Apley's scratch test (touch opposite shoulder): Action 1 (Across chest):deferred Action 2 (Overhead):deferred Action 3 (LB reach):deferred   Provocative Test(s): Phalen's test:deferred Tinel's test:deferred Apley's scratch test (touch opposite shoulder): Action 1 (Across chest):Decreased ROM Action 2 (Overhead):Decreased ROM Action 3 (LB reach):Decreased ROM     Thoracic Spine Area Exam  Skin & Axial Inspection:Well healed scar from previous spine surgery detected Alignment:Symmetrical Functional IEP:PIRJJOACZYSA restricted ROM Stability:No instability detected Muscle Tone/Strength:Functionally intact. No obvious neuro-muscular anomalies detected. Sensory (Neurological):Neurogenic pain pattern Muscle strength & Tone:No palpable anomalies  Lumbar Exam  Skin & Axial Inspection:Well healed scar from previous spine surgery detected Alignment:Scoliosis detected Functional YTK:ZSWF restricted ROM Stability:No instability detected Muscle Tone/Strength:Functionally intact. No obvious neuro-muscular anomalies detected. Sensory (Neurological): Musculoskeletal pain pattern Palpation:Complains of area being tender to palpation  Gait & Posture Assessment  Ambulation:Limited Gait:Antalgic Posture:Difficulty standing up straight, due to pain  Lower Extremity Exam     Side:Right lower extremity  Side:Left lower extremity  Stability:No instability observed  Stability:No instability observed  Skin & Extremity Inspection:Skin color, temperature, and hair growth are WNL. No peripheral edema or cyanosis. No masses, redness, swelling, asymmetry, or associated skin lesions. No contractures.  Skin & Extremity Inspection:Skin color, temperature, and hair growth are WNL. No peripheral edema or cyanosis. No masses, redness, swelling, asymmetry, or associated skin lesions. No contractures.  Functional UXN:ATFT restricted ROM   Functional DDU:KGUR restricted ROM   Muscle Tone/Strength:Functionally intact. No obvious neuro-muscular anomalies detected.  Muscle Tone/Strength:Functionally intact. No obvious neuro-muscular anomalies detected.  Sensory (Neurological):Unimpaired  Sensory (Neurological):Unimpaired  DTR: Patellar:deferred today Achilles:deferred today Plantar:deferred today  DTR: Patellar:deferred today Achilles:deferred today Plantar:deferred today  Palpation:No palpable anomalies  Palpation:No palpable anomalies    Assessment   Status Diagnosis  Controlled Controlled Controlled 1. Infantile idiopathic scoliosis of thoracolumbar region   2. Myofascial pain syndrome of lumbar spine   3. Cervicalgia   4. Disorder of left rotator cuff   5. Chronic left shoulder pain   6. Chronic pain syndrome       Plan of Care  Ms. Anesha Hackert Schwan has a current medication list which includes the following long-term medication(s): albuterol, famotidine, ferrous sulfate, gabapentin, medroxyprogesterone, promethazine, and trazodone.  Pharmacotherapy (Medications Ordered): Meds ordered this encounter  Medications  . buprenorphine (BUTRANS) 5 MCG/HR PTWK    Sig: Place 1 patch onto the skin  once a week.    Dispense:  4 patch    Refill:  2    For chronic pain syndrome     Follow-up plan:   Return in about 3 months (around 12/09/2020) for Medication Management, in person.      Pharmacological management options:  Opioid Analgesics: Butrans patch, side effects at 7.5 mcg an hour, analgesic benefit with no side effects at 5 mcg an hour, continue  Membrane stabilizer: Continue gabapentin as prescribed  Muscle relaxant: Continue Flexeril as prescribed  NSAID: To be determined at a later time  Other analgesic(s): To be determined at a later time   Interventional management options: Ms. Paske was informed that there is no guarantee that she would be a candidate for interventional therapies. The decision will be based on the results of diagnostic studies, as well as Ms. Golaszewski's risk profile.  Procedure(s) under consideration:  S/p left TPI 11/04/19 75% pain relief for 5 days. Left axillary peripheral nerve stimulation: 12/30/19 providing significant pain relief.  Removed 02/29/2020            Recent Visits Date Type Provider Dept  08/08/20 Procedure visit Gillis Santa, MD [facility]  07/21/20 Office Visit Gillis Santa, MD [facility]  Showing recent visits within past 90 days and meeting all other requirements Today's Visits Date Type Provider Dept  09/08/20 Office Visit Gillis Santa, MD [facility]  Showing today's visits and meeting all other requirements Future Appointments Date Type Provider Dept  09/12/20 Appointment Gillis Santa, MD [facility]  Showing future appointments within next 90 days and meeting all other requirements  I discussed the assessment and treatment plan with the patient. The patient was provided an opportunity to ask questions and all were answered. The patient agreed with the plan and demonstrated an understanding of the instructions.  Patient advised to call back or seek an in-person evaluation if the symptoms or condition worsens.  Duration of encounter: 32mnutes.  Note by:  BGillis Santa MD Date: 09/08/2020; Time: 2:55 PM

## 2020-09-12 ENCOUNTER — Other Ambulatory Visit: Payer: Self-pay

## 2020-09-12 ENCOUNTER — Ambulatory Visit
Admission: RE | Admit: 2020-09-12 | Discharge: 2020-09-12 | Disposition: A | Discharge status: Home / Self Care | Payer: Medicare HMO | Source: Ambulatory Visit | Attending: Student in an Organized Health Care Education/Training Program | Admitting: Student in an Organized Health Care Education/Training Program

## 2020-09-12 ENCOUNTER — Ambulatory Visit (HOSPITAL_BASED_OUTPATIENT_CLINIC_OR_DEPARTMENT_OTHER): Payer: Medicare HMO | Admitting: Student in an Organized Health Care Education/Training Program

## 2020-09-12 ENCOUNTER — Encounter: Payer: Self-pay | Admitting: Student in an Organized Health Care Education/Training Program

## 2020-09-12 DIAGNOSIS — M4105 Infantile idiopathic scoliosis, thoracolumbar region: Secondary | ICD-10-CM | POA: Diagnosis not present

## 2020-09-12 DIAGNOSIS — G894 Chronic pain syndrome: Secondary | ICD-10-CM | POA: Diagnosis not present

## 2020-09-12 DIAGNOSIS — M7918 Myalgia, other site: Secondary | ICD-10-CM | POA: Insufficient documentation

## 2020-09-12 MED ORDER — ROPIVACAINE HCL 2 MG/ML IJ SOLN
9.0000 mL | Freq: Once | INTRAMUSCULAR | Status: AC
  Administered 2020-09-12: 10 mL via PERINEURAL

## 2020-09-12 MED ORDER — ROPIVACAINE HCL 2 MG/ML IJ SOLN
INTRAMUSCULAR | Status: AC
  Filled 2020-09-12: qty 20

## 2020-09-12 MED ORDER — DIAZEPAM 5 MG PO TABS
ORAL_TABLET | ORAL | Status: AC
  Filled 2020-09-12: qty 2

## 2020-09-12 MED ORDER — DEXAMETHASONE SODIUM PHOSPHATE 10 MG/ML IJ SOLN
10.0000 mg | Freq: Once | INTRAMUSCULAR | Status: AC
  Administered 2020-09-12: 10 mg

## 2020-09-12 MED ORDER — DEXAMETHASONE SODIUM PHOSPHATE 10 MG/ML IJ SOLN
INTRAMUSCULAR | Status: AC
  Filled 2020-09-12: qty 1

## 2020-09-12 MED ORDER — DIAZEPAM 5 MG PO TABS
10.0000 mg | ORAL_TABLET | Freq: Once | ORAL | Status: AC
  Administered 2020-09-12: 10 mg via ORAL

## 2020-09-12 NOTE — Progress Notes (Signed)
PROVIDER NOTE: Information contained herein reflects review and annotations entered in association with encounter. Interpretation of such information and data should be left to medically-trained personnel. Information provided to patient can be located elsewhere in the medical record under "Patient Instructions". Document created using STT-dictation technology, any transcriptional errors that may result from process are unintentional.    Patient: Brianna Arnold  Service Category: Procedure  Provider: Edward JollyBilal Irisha Grandmaison, MD  DOB: 02-13-1991  DOS: 09/12/2020  Location: [facility]  MRN: [id number]  Setting: Ambulatory - outpatient  Referring Provider: Edward JollyLateef, Katelynn Heidler, MD  Type: Established Patient  Specialty: Interventional Pain Management  PCP: Marguarite ArbourSparks, Jeffrey D, MD   Primary Reason for Visit: Interventional Pain Management Treatment. CC: Back Pain (Lower, neck)  Procedure:          Anesthesia, Analgesia, Anxiolysis:  Type: Lumbar Paraspinal Muscle Trigger Point Injection (3+)         Quadratus lumborum, erector spinae, multifidus and left trapzeius and periscapular region CPT: 20553 with fluoroscopy Primary Purpose: Therapeutic Approach: Percutaneous, ipsilateral approach. Laterality: Midline        Type: Local Anesthesia with PO Valium for anxiety   Position: Prone   Indications: 1. Infantile idiopathic scoliosis of thoracolumbar region   2. Myofascial pain syndrome of lumbar spine   3. Chronic pain syndrome    Pain Score: Pre-procedure: 7 /10 Post-procedure: 5 /10   Pre-op H&P Assessment:  Ms. Brianna GandyRiddle is a 30 y.o. (year old), female patient, seen today for interventional treatment. She  has a past surgical history that includes Cervical spine surgery (07/2014); Back surgery (10/2014); and Anterior cervical decomp/discectomy fusion (N/A, 02/11/2019). Ms. Brianna GandyRiddle has a current medication list which includes the following prescription(s): albuterol, buprenorphine,  butalbital-acetaminophen-caffeine, vitamin d3, cyclobenzaprine, ergocalciferol, famotidine, ferrous sulfate, gabapentin, medroxyprogesterone, promethazine, tramadol, trazodone, and vitamin b-12. Her primarily concern today is the Back Pain (Lower, neck)  Initial Vital Signs:  Pulse/HCG Rate: (!) 101  Temp: (!) 97.5 F (36.4 C) Resp: 16 BP: (!) 137/97 SpO2: 100 %  BMI: Estimated body mass index is 34.86 kg/m as calculated from the following:   Height as of this encounter: 4\' 7"  (1.397 m).   Weight as of this encounter: 150 lb (68 kg).  Risk Assessment: Allergies: Reviewed. She is allergic to hypafix [wound dressings], ativan [lorazepam], and tizanidine.  Allergy Precautions: None required Coagulopathies: Reviewed. None identified.  Blood-thinner therapy: None at this time Active Infection(s): Reviewed. None identified. Ms. Brianna GandyRiddle is afebrile  Site Confirmation: Ms. Brianna GandyRiddle was asked to confirm the procedure and laterality before marking the site Procedure checklist: Completed Consent: Before the procedure and under the influence of no sedative(s), amnesic(s), or anxiolytics, the patient was informed of the treatment options, risks and possible complications. To fulfill our ethical and legal obligations, as recommended by the American Medical Association's Code of Ethics, I have informed the patient of my clinical impression; the nature and purpose of the treatment or procedure; the risks, benefits, and possible complications of the intervention; the alternatives, including doing nothing; the risk(s) and benefit(s) of the alternative treatment(s) or procedure(s); and the risk(s) and benefit(s) of doing nothing. The patient was provided information about the general risks and possible complications associated with the procedure. These may include, but are not limited to: failure to achieve desired goals, infection, bleeding, organ or nerve damage, allergic reactions, paralysis, and death. In  addition, the patient was informed of those risks and complications associated to the procedure, such as failure to decrease pain; infection;  bleeding; organ or nerve damage with subsequent damage to sensory, motor, and/or autonomic systems, resulting in permanent pain, numbness, and/or weakness of one or several areas of the body; allergic reactions; (i.e.: anaphylactic reaction); and/or death. Furthermore, the patient was informed of those risks and complications associated with the medications. These include, but are not limited to: allergic reactions (i.e.: anaphylactic or anaphylactoid reaction(s)); adrenal axis suppression; blood sugar elevation that in diabetics may result in ketoacidosis or comma; water retention that in patients with history of congestive heart failure may result in shortness of breath, pulmonary edema, and decompensation with resultant heart failure; weight gain; swelling or edema; medication-induced neural toxicity; particulate matter embolism and blood vessel occlusion with resultant organ, and/or nervous system infarction; and/or aseptic necrosis of one or more joints. Finally, the patient was informed that Medicine is not an exact science; therefore, there is also the possibility of unforeseen or unpredictable risks and/or possible complications that may result in a catastrophic outcome. The patient indicated having understood very clearly. We have given the patient no guarantees and we have made no promises. Enough time was given to the patient to ask questions, all of which were answered to the patient's satisfaction. [name] has indicated that she wanted to continue with the procedure. Attestation: I, the ordering provider, attest that I have discussed with the patient the benefits, risks, side-effects, alternatives, likelihood of achieving goals, and potential problems during recovery for the procedure that I have provided informed consent. Date  Time: 09/12/2020  1:59  PM  Pre-Procedure Preparation:  Monitoring: As per clinic protocol. Respiration, ETCO2, SpO2, BP, heart rate and rhythm monitor placed and checked for adequate function Safety Precautions: Patient was assessed for positional comfort and pressure points before starting the procedure. Time-out: I initiated and conducted the "Time-out" before starting the procedure, as per protocol. The patient was asked to participate by confirming the accuracy of the "Time Out" information. Verification of the correct person, site, and procedure were performed and confirmed by me, the nursing staff, and the patient. "Time-out" conducted as per Joint Commission's Universal Protocol (UP.01.01.01). Time: 1444  Description of Procedure:          Area Prepped: Entire Lower Lumbosacral Region DuraPrep (Iodine Povacrylex [0.7% available iodine] and Isopropyl Alcohol, 74% w/w) Safety Precautions: Aspiration looking for blood return was conducted prior to all injections. At no point did we inject any substances, as a needle was being advanced. No attempts were made at seeking any paresthesias. Safe injection practices and needle disposal techniques used. Medications properly checked for expiration dates. SDV (single dose vial) medications used. Description of the Procedure: Protocol guidelines were followed. The patient was placed in position over the fluoroscopy table. The target area was identified and the area prepped in the usual manner. Skin & deeper tissues infiltrated with local anesthetic. Appropriate amount of time allowed to pass for local anesthetics to take effect. The procedure needles were then advanced to the target area. Proper needle placement secured. Negative aspiration confirmed. Solution injected in intermittent fashion, asking for systemic symptoms every 0.5cc of injectate. The needles were then removed and the area cleansed, making sure to leave some of the prepping solution back to take advantage of its  long term bactericidal properties.  Vitals:   09/12/20 1407  BP: (!) 137/97  Pulse: (!) 101  Resp: 16  Temp: (!) 97.5 F (36.4 C)  TempSrc: Temporal  SpO2: 100%  Weight: 150 lb (68 kg)  Height: 4\' 7"  (1.397 m)  Start Time: 1444 hrs. End Time: 1451 hrs. Materials:  Needle(s) Type: Regular needle Gauge: 27G Length: 1.5-in Medication(s): Please see orders for medications and dosing details. Approximately 5 trigger point injected in the lower lumbar region with 0.5 to 1 cc of 0.2% ropivacaine with needling performed.  Approximately 12 trigger points were also injected in the left periscapular, left trapezius region with dry needling performed.  Each trigger point was injected with 0.5 to 1 cc of 0.2% ropivacaine. Imaging Guidance:          Type of Imaging Technique: Fluoroscopy Guidance (Spinal) Indication(s): Assistance in needle guidance and placement for procedures requiring needle placement in or near specific anatomical locations not easily accessible without such assistance. Exposure Time: No patient exposure Contrast: None used. Fluoroscopic Guidance: I was personally present during the use of fluoroscopy. "Tunnel Vision Technique" used to obtain the best possible view of the target area. Parallax error corrected before commencing the procedure. "Direction-depth-direction" technique used to introduce the needle under continuous pulsed fluoroscopy. Once target was reached, antero-posterior, oblique, and lateral fluoroscopic projection used confirm needle placement in all planes. Images permanently stored in EMR. Ultrasound Guidance: N/A Interpretation: I personally interpreted the imaging intraoperatively. Adequate needle placement confirmed in multiple planes. Appropriate spread of contrast into desired area was observed. No evidence of afferent or efferent intravascular uptake. No intrathecal or subarachnoid spread observed. Permanent images saved into the patient's  record.  Post-operative Assessment:  Post-procedure Vital Signs:  Pulse/HCG Rate: (!) 101  Temp: (!) 97.5 F (36.4 C) Resp: 16 BP: (!) 137/97 SpO2: 100 %  EBL: None  Complications: No immediate post-treatment complications observed by team, or reported by patient.  Note: The patient tolerated the entire procedure well. A repeat set of vitals were taken after the procedure and the patient was kept under observation following institutional policy, for this type of procedure. Post-procedural neurological assessment was performed, showing return to baseline, prior to discharge. The patient was provided with post-procedure discharge instructions, including a section on how to identify potential problems. Should any problems arise concerning this procedure, the patient was given instructions to immediately contact us, at any time, without hesitation. In any case, we plan to contact the patient by telephone for a follow-up status report regarding this interventional procedure.  Comments:  No additional relevant information.  Plan of Care  Orders:  Orders Placed This Encounter  Procedures  . DG PAIN CLIN[facility] C-ARM 1-60 MIN NO REPORT    Intraoperative interpretation by procedural physician at [facility].    Standing Status:   Standing    Number of Occurrences:   1    Order Specific Question:   Reason for exam:    Answer:   Assistance in needle guidance and placement for procedures requiring needle placement in or near specific anatomical locations not easily accessible without such assistance.    Medications ordered for procedure: Meds ordered this encounter  Medications  . ropivacaine (PF) 2 mg/mL (0.2%) (NAROPIN) injection 9 mL  . ropivacaine (PF) 2 mg/mL (0.2%) (NAROPIN) injection 9 mL  . dexamethasone (DECADRON) injection 10 mg  . diazepam (VALIUM) tablet 10 mg   Medications administered: We administered ropivacaine (PF) 2 mg/mL (0.2%), ropivacaine (PF) 2 mg/mL (0.2%),  dexamethasone, and diazepam.  See the medical record for exact dosing, route, and time of administration.  Follow-up plan:   Return in about 4 weeks (around 10/10/2020) for PP in person.       Pharmacological management options:  Opioid Analgesics:  Butrans patch, side effects at 7.5 mcg an hour, analgesic benefit with no side effects at 5 mcg an hour, continue  Membrane stabilizer: Continue gabapentin as prescribed  Muscle relaxant: Continue Flexeril as prescribed  NSAID: To be determined at a later time  Other analgesic(s): To be determined at a later time   Interventional management options: Ms. Edgin was informed that there is no guarantee that she would be a candidate for interventional therapies. The decision will be based on the results of diagnostic studies, as well as Ms. Elrod's risk profile.  Procedure(s) under consideration:  Left axillary peripheral nerve stimulation: 12/30/19 providing significant pain relief.  Removed 02/29/2020             Recent Visits Date Type Provider Dept  09/08/20 Office Visit Edward Jolly, MD [facility]  08/08/20 Procedure visit Edward Jolly, MD [facility]  07/21/20 Office Visit Edward Jolly, MD [facility]  Showing recent visits within past 90 days and meeting all other requirements Today's Visits Date Type Provider Dept  09/12/20 Procedure visit Edward Jolly, MD [facility]  Showing today's visits and meeting all other requirements Future Appointments Date Type Provider Dept  10/20/20 Appointment Edward Jolly, MD [facility]  Showing future appointments within next 90 days and meeting all other requirements  Disposition: Discharge home  Discharge (Date  Time): 09/12/2020; 1500 hrs.   Primary Care Physician: Marguarite Arbour, MD Location: [facility] Note by: Edward Jolly, MD Date: 09/12/2020; Time: 3:19 PM  Disclaimer:  Medicine is not an exact  science. The only guarantee in medicine is that nothing is guaranteed. It is important to note that the decision to proceed with this intervention was based on the information collected from the patient. The Data and conclusions were drawn from the patient's questionnaire, the interview, and the physical examination. Because the information was provided in large part by the patient, it cannot be guaranteed that it has not been purposely or unconsciously manipulated. Every effort has been made to obtain as much relevant data as possible for this evaluation. It is important to note that the conclusions that lead to this procedure are derived in large part from the available data. Always take into account that the treatment will also be dependent on availability of resources and existing treatment guidelines, considered by other Pain Management Practitioners as being common knowledge and practice, at the time of the intervention. For Medico-Legal purposes, it is also important to point out that variation in procedural techniques and pharmacological choices are the acceptable norm. The indications, contraindications, technique, and results of the above procedure should only be interpreted and judged by a Board-Certified Interventional Pain Specialist with extensive familiarity and expertise in the same exact procedure and technique.

## 2020-09-12 NOTE — Progress Notes (Signed)
Safety precautions to be maintained throughout the outpatient stay will include: orient to surroundings, keep bed in low position, maintain call bell within reach at all times, provide assistance with transfer out of bed and ambulation.  

## 2020-09-12 NOTE — Patient Instructions (Signed)

## 2020-09-13 ENCOUNTER — Telehealth: Payer: Self-pay | Admitting: *Deleted

## 2020-09-13 NOTE — Telephone Encounter (Signed)
No problems post procedure. 

## 2020-09-19 DIAGNOSIS — G9589 Other specified diseases of spinal cord: Secondary | ICD-10-CM | POA: Insufficient documentation

## 2020-10-14 ENCOUNTER — Other Ambulatory Visit: Payer: Self-pay | Admitting: Neurosurgery

## 2020-10-14 ENCOUNTER — Other Ambulatory Visit (HOSPITAL_COMMUNITY): Payer: Self-pay | Admitting: Neurosurgery

## 2020-10-14 DIAGNOSIS — Q798 Other congenital malformations of musculoskeletal system: Secondary | ICD-10-CM

## 2020-10-14 DIAGNOSIS — G959 Disease of spinal cord, unspecified: Secondary | ICD-10-CM

## 2020-10-14 DIAGNOSIS — G894 Chronic pain syndrome: Secondary | ICD-10-CM

## 2020-10-14 DIAGNOSIS — Z981 Arthrodesis status: Secondary | ICD-10-CM

## 2020-10-20 ENCOUNTER — Other Ambulatory Visit: Payer: Self-pay

## 2020-10-20 ENCOUNTER — Ambulatory Visit
Payer: Medicare HMO | Attending: Student in an Organized Health Care Education/Training Program | Admitting: Student in an Organized Health Care Education/Training Program

## 2020-10-20 VITALS — BP 127/90 | HR 92 | Temp 97.9°F | Resp 16 | Ht <= 58 in | Wt 150.0 lb

## 2020-10-20 DIAGNOSIS — G894 Chronic pain syndrome: Secondary | ICD-10-CM | POA: Diagnosis present

## 2020-10-20 DIAGNOSIS — Q7649 Other congenital malformations of spine, not associated with scoliosis: Secondary | ICD-10-CM | POA: Diagnosis present

## 2020-10-20 DIAGNOSIS — Z981 Arthrodesis status: Secondary | ICD-10-CM | POA: Insufficient documentation

## 2020-10-20 DIAGNOSIS — M67912 Unspecified disorder of synovium and tendon, left shoulder: Secondary | ICD-10-CM | POA: Insufficient documentation

## 2020-10-20 DIAGNOSIS — M542 Cervicalgia: Secondary | ICD-10-CM | POA: Diagnosis present

## 2020-10-20 DIAGNOSIS — Q766 Other congenital malformations of ribs: Secondary | ICD-10-CM | POA: Diagnosis present

## 2020-10-20 DIAGNOSIS — M7918 Myalgia, other site: Secondary | ICD-10-CM | POA: Insufficient documentation

## 2020-10-20 DIAGNOSIS — M4105 Infantile idiopathic scoliosis, thoracolumbar region: Secondary | ICD-10-CM | POA: Insufficient documentation

## 2020-10-20 MED ORDER — BUPRENORPHINE 5 MCG/HR TD PTWK: 1.0000 | MEDICATED_PATCH | TRANSDERMAL | 2 refills | Status: DC

## 2020-10-20 NOTE — Progress Notes (Signed)
PROVIDER NOTE: Information contained herein reflects review and annotations entered in association with encounter. Interpretation of such information and data should be left to medically-trained personnel. Information provided to patient can be located elsewhere in the medical record under "Patient Instructions". Document created using STT-dictation technology, any transcriptional errors that may result from process are unintentional.    Patient: Brianna Arnold  Service Category: E/M  Provider: Gillis Santa, MD  DOB: 06/24/1990  DOS: 10/20/2020  Specialty: Interventional Pain Management  MRN: [id number]  Setting: Ambulatory outpatient  PCP: Idelle Crouch, MD  Type: Established Patient    Referring Provider: Idelle Crouch, MD  Location: Office  Delivery: Face-to-face     HPI  [name], a 30 y.o. year old female, is here today because of her Infantile idiopathic scoliosis of thoracolumbar region [M41.05]. [name]'s primary complain today is Shoulder Pain (left) Last encounter: My last encounter with her was on 09/12/20 Pertinent problems: [name] has Status post cervical spinal fusion; Migraine without aura and without status migrainosus, not intractable; History of lumbar spinal fusion (T11- iliac); Cervicalgia; Chronic left shoulder pain; Disorder of left rotator cuff; and Chronic pain syndrome on their pertinent problem list. Pain Assessment: Severity of Chronic pain is reported as a 6 /10. Location: Shoulder Right/radiates down arms to pinkie and ring fingers. Onset: More than a month ago. Quality: Constant, Stabbing, Throbbing, Tightness. Timing: Constant. Modifying factor(s): meds, ice, rest. Vitals:  height is 4' 7.5" (1.41 m) and weight is 150 lb (68 kg). Her temperature is 97.9 F (36.6 C). Her blood pressure is 127/90 and her pulse is 92. Her respiration is 16 and oxygen saturation is 100%.   Reason for encounter: medication management.    Patient  presents today for medication management as well as worsening neck pain that radiates into her left shoulder as well as worsening weakness.  She states that she has been having a difficult time managing her pain.  She did see Dr. Cari Caraway who has ordered a CT cervical spine without contrast and MRI of cervical spine without contrast.  Patient would also like to repeat trigger point injections in her lower lumbar spine which she finds benefit from from a pain standpoint and also range of motion.  I will refill her Butrans patch as below.  She will follow-up for TPI.   Pharmacotherapy Assessment   Analgesic: Butrans patch 5 mcg/hr  Monitoring: [place] PMP: PDMP reviewed during this encounter.       Pharmacotherapy: No side-effects or adverse reactions reported. Compliance: No problems identified. Effectiveness: Clinically acceptable.  Ignatius Specking, RN  10/20/2020  8:49 AM  Sign when Signing Visit Safety precautions to be maintained throughout the outpatient stay will include: orient to surroundings, keep bed in low position, maintain call bell within reach at all times, provide assistance with transfer out of bed and ambulation.       UDS:  Summary  Date Value Ref Range Status  10/15/2019 Note  Final    Comment:    ==================================================================== Compliance Drug Analysis, Ur ==================================================================== Test                             Result       Flag       Units  Drug Absent but Declared for Prescription Verification   Buprenorphine                  Not  Detected UNEXPECTED ng/mg creat    Transdermal buprenorphine, as indicated in the declared medication    list, is not always detected even when used as directed.    Tramadol                       Not Detected UNEXPECTED ng/mg creat   Gabapentin                     Not Detected UNEXPECTED   Cyclobenzaprine                Not Detected UNEXPECTED    Trazodone                      Not Detected UNEXPECTED   Promethazine                   Not Detected UNEXPECTED ==================================================================== Test                      Result    Flag   Units      Ref Range   Creatinine              91               mg/dL      >=20 ==================================================================== Declared Medications:  The flagging and interpretation on this report are based on the  following declared medications.  Unexpected results may arise from  inaccuracies in the declared medications.   **Note: The testing scope of this panel includes these medications:   Cyclobenzaprine (Flexeril)  Gabapentin (Neurontin)  Promethazine (Phenergan)  Tramadol (Ultram)  Trazodone (Desyrel)   **Note: The testing scope of this panel does not include small to  moderate amounts of these reported medications:   Buprenorphine Patch (BuTrans)   **Note: The testing scope of this panel does not include the  following reported medications:   Albuterol (Ventolin HFA)  Ethinyl Estradiol (Sprintec)  Famotidine (Pepcid)  Iron  Norgestimate (Sprintec)  Vitamin B12  Vitamin D3 ==================================================================== For clinical consultation, please call [phone]. ====================================================================      ROS  Constitutional: Denies any fever or chills Gastrointestinal: No reported hemesis, hematochezia, vomiting, or acute GI distress Musculoskeletal:  Left shoulder pain, bilateral low back pain Neurological: No reported episodes of acute onset apraxia, aphasia, dysarthria, agnosia, amnesia, paralysis, loss of coordination, or loss of consciousness  Medication Review  Vitamin D3, albuterol, buprenorphine, butalbital-acetaminophen-caffeine, cyclobenzaprine, ergocalciferol, famotidine, ferrous sulfate, gabapentin, medroxyPROGESTERone, promethazine, traMADol,  traZODone, and vitamin B-12  History Review  Allergy: [name] is allergic to hypafix [wound dressings], ativan [lorazepam], and tizanidine. Drug: [name]  reports no history of drug use. Alcohol:  has no history on file for alcohol use. Tobacco:  reports that she has never smoked. She has never used smokeless tobacco. Social: [name]  reports that she has never smoked. She has never used smokeless tobacco. She reports that she does not use drugs. Medical:  has a past medical history of Anemia and Jarcho-Levin syndrome. Surgical: [name]  has a past surgical history that includes Cervical spine surgery (07/2014); Back surgery (10/2014); and Anterior cervical decomp/discectomy fusion (N/A, 02/11/2019). Family: family history includes Hypertension in her father.  Laboratory Chemistry Profile   Renal Lab Results  Component Value Date   BUN 6 02/15/2019   CREATININE 0.63 02/15/2019   GFRAA >60 02/15/2019   GFRNONAA >60 02/15/2019  Hepatic Lab Results  Component Value Date   AST 17 02/14/2019   ALT 12 02/14/2019   ALBUMIN 3.9 02/14/2019   ALKPHOS 55 02/14/2019   LIPASE 18 02/14/2019     Electrolytes Lab Results  Component Value Date   NA 137 02/15/2019   K 4.1 02/15/2019   CL 106 02/15/2019   CALCIUM 8.1 (L) 02/15/2019     Bone No results found for: VD25OH, [id number], [id number], SW5462VO3, 25OHVITD1, 25OHVITD2, 25OHVITD3, TESTOFREE, TESTOSTERONE   Inflammation (CRP: Acute Phase) (ESR: Chronic Phase) Lab Results  Component Value Date   LATICACIDVEN 1.6 04/19/2015       Note: Above Lab results reviewed.  Physical Exam  General appearance: Well nourished, well developed, and well hydrated. In no apparent acute distress Mental status: Alert, oriented x 3 (person, place, & time)       Respiratory: No evidence of acute respiratory distress Eyes: [name] Vitals: BP 127/90   Pulse 92   Temp 97.9 F (36.6 C)   Resp 16   Ht 4' 7.5" (1.41 m)   Wt 150 lb (68  kg)   SpO2 100%   BMI 34.24 kg/m  BMI: Estimated body mass index is 34.24 kg/m as calculated from the following:   Height as of this encounter: 4' 7.5" (1.41 m).   Weight as of this encounter: 150 lb (68 kg). Ideal: Patient must be at least 60 in tall to calculate ideal body weight  Cervical Spine Exam  Skin & Axial Inspection: Well healed scar from previous spine surgery detected Alignment: Asymmetric Functional ROM: Pain restricted ROM      Stability: No instability detected Muscle Tone/Strength: Functionally intact. No obvious neuro-muscular anomalies detected. Sensory (Neurological): Neuropathic pain pattern Palpation: No palpable anomalies                          Upper Extremity (UE) Exam      Side: Right upper extremity   Side: Left upper extremity    Skin & Extremity Inspection: Skin color, temperature, and hair growth are WNL. No peripheral edema or cyanosis. No masses, redness, swelling, asymmetry, or associated skin lesions. No contractures.   Skin & Extremity Inspection:  Axillary nerve peripheral nerve stimulator in place, insertion site not erythematous, nonindurated.    Functional ROM: Unrestricted ROM           Functional ROM: Decreased ROM for all joints of upper extremity however improved since axillary nerve peripheral nerve stimulation    Muscle Tone/Strength: Functionally intact. No obvious neuro-muscular anomalies detected.   Muscle Tone/Strength: Functionally intact. No obvious neuro-muscular anomalies detected.    Sensory (Neurological): Dermatomal pain pattern           Sensory (Neurological): Dermatomal pain pattern            Palpation: No palpable anomalies               Palpation: No palpable anomalies                Provocative Test(s):  Phalen's test: deferred Tinel's test: deferred Apley's scratch test (touch opposite shoulder):  Action 1 (Across chest): deferred Action 2 (Overhead): deferred Action 3 (LB reach): deferred     Provocative Test(s):   Phalen's test: deferred Tinel's test: deferred Apley's scratch test (touch opposite shoulder):  Action 1 (Across chest): Decreased ROM Action 2 (Overhead): Decreased ROM Action 3 (LB reach): Decreased ROM        Thoracic Spine Area  Exam  Skin & Axial Inspection: Well healed scar from previous spine surgery detected Alignment: Symmetrical Functional ROM: Mechanically restricted ROM Stability: No instability detected Muscle Tone/Strength: Functionally intact. No obvious neuro-muscular anomalies detected. Sensory (Neurological): Neurogenic pain pattern Muscle strength & Tone: No palpable anomalies   Lumbar Exam  Skin & Axial Inspection: Well healed scar from previous spine surgery detected Alignment: Scoliosis detected Functional ROM: Pain restricted ROM       Stability: No instability detected Muscle Tone/Strength: Functionally intact. No obvious neuro-muscular anomalies detected. Sensory (Neurological):  Musculoskeletal pain pattern Palpation: Complains of area being tender to palpation         Gait & Posture Assessment  Ambulation: Limited Gait: Antalgic Posture: Difficulty standing up straight, due to pain    Lower Extremity Exam      Side: Right lower extremity   Side: Left lower extremity  Stability: No instability observed           Stability: No instability observed          Skin & Extremity Inspection: Skin color, temperature, and hair growth are WNL. No peripheral edema or cyanosis. No masses, redness, swelling, asymmetry, or associated skin lesions. No contractures.   Skin & Extremity Inspection: Skin color, temperature, and hair growth are WNL. No peripheral edema or cyanosis. No masses, redness, swelling, asymmetry, or associated skin lesions. No contractures.  Functional ROM: Pain restricted ROM                   Functional ROM: Pain restricted ROM                  Muscle Tone/Strength: Functionally intact. No obvious neuro-muscular anomalies detected.   Muscle  Tone/Strength: Functionally intact. No obvious neuro-muscular anomalies detected.  Sensory (Neurological): Unimpaired         Sensory (Neurological): Unimpaired        DTR: Patellar: deferred today Achilles: deferred today Plantar: deferred today   DTR: Patellar: deferred today Achilles: deferred today Plantar: deferred today  Palpation: No palpable anomalies   Palpation: No palpable anomalies     Assessment   Status Diagnosis  Having a Flare-up Having a Flare-up Having a Flare-up 1. Infantile idiopathic scoliosis of thoracolumbar region   2. Myofascial pain syndrome of lumbar spine   3. Cervicalgia   4. Disorder of left rotator cuff   5. Status post cervical spinal fusion   6. Chronic pain syndrome   7. Autosomal recessive spondylocostal dysostosis       Plan of Care  [name] has a current medication list which includes the following long-term medication(s): albuterol, famotidine, ferrous sulfate, gabapentin, medroxyprogesterone, promethazine, and trazodone.  Pharmacotherapy (Medications Ordered): Meds ordered this encounter  Medications   buprenorphine (BUTRANS) 5 MCG/HR PTWK    Sig: Place 1 patch onto the skin once a week.    Dispense:  4 patch    Refill:  2    For chronic pain syndrome    Orders Placed This Encounter  Procedures   TRIGGER POINT INJECTION    Standing Status:   Future    Standing Expiration Date:   01/20/2021    Scheduling Instructions:     Lumbar spine TPI with fluoro    Order Specific Question:   Where will this procedure be performed?    Answer:   [facility]     Follow-up plan:   Return in about 2 weeks (around 11/03/2020) for Lumbar TPI with fluoro.  Pharmacological management options:  Opioid Analgesics: Butrans patch, side effects at 7.5 mcg an hour, analgesic benefit with no side effects at 5 mcg an hour, continue  Membrane stabilizer: Continue gabapentin as prescribed  Muscle relaxant: Continue  Flexeril as prescribed  NSAID: To be determined at a later time  Other analgesic(s): To be determined at a later time    Interventional management options: [name] was informed that there is no guarantee that she would be a candidate for interventional therapies. The decision will be based on the results of diagnostic studies, as well as [name]'s risk profile.  Procedure(s) under consideration:  S/p left TPI 11/04/19 75% pain relief for 5 days. Left axillary peripheral nerve stimulation: 12/30/19 providing significant pain relief.  Removed 02/29/2020             Recent Visits Date Type Provider Dept  09/12/20 Procedure visit Gillis Santa, MD [facility]  09/08/20 Office Visit Gillis Santa, MD [facility]  08/08/20 Procedure visit Gillis Santa, MD [facility]  Showing recent visits within past 90 days and meeting all other requirements Today's Visits Date Type Provider Dept  10/20/20 Office Visit Gillis Santa, MD [facility]  Showing today's visits and meeting all other requirements Future Appointments Date Type Provider Dept  10/26/20 Appointment Gillis Santa, MD [facility]  12/15/20 Appointment Gillis Santa, MD [facility]  Showing future appointments within next 90 days and meeting all other requirements I discussed the assessment and treatment plan with the patient. The patient was provided an opportunity to ask questions and all were answered. The patient agreed with the plan and demonstrated an understanding of the instructions.  Patient advised to call back or seek an in-person evaluation if the symptoms or condition worsens.  Duration of encounter: 40mnutes.  Note by: BGillis Santa MD Date: 10/20/2020; Time: 9:55 AM

## 2020-10-20 NOTE — Progress Notes (Signed)
Safety precautions to be maintained throughout the outpatient stay will include: orient to surroundings, keep bed in low position, maintain call bell within reach at all times, provide assistance with transfer out of bed and ambulation.  

## 2020-10-26 ENCOUNTER — Other Ambulatory Visit: Payer: Self-pay

## 2020-10-26 ENCOUNTER — Ambulatory Visit (HOSPITAL_BASED_OUTPATIENT_CLINIC_OR_DEPARTMENT_OTHER): Payer: Medicare HMO | Admitting: Student in an Organized Health Care Education/Training Program

## 2020-10-26 ENCOUNTER — Ambulatory Visit
Admission: RE | Admit: 2020-10-26 | Discharge: 2020-10-26 | Disposition: A | Discharge status: Home / Self Care | Payer: Medicare HMO | Source: Ambulatory Visit | Attending: Student in an Organized Health Care Education/Training Program | Admitting: Student in an Organized Health Care Education/Training Program

## 2020-10-26 VITALS — BP 137/111 | HR 104 | Temp 97.7°F | Resp 16 | Ht <= 58 in | Wt 150.0 lb

## 2020-10-26 DIAGNOSIS — G894 Chronic pain syndrome: Secondary | ICD-10-CM | POA: Diagnosis present

## 2020-10-26 DIAGNOSIS — M7918 Myalgia, other site: Secondary | ICD-10-CM | POA: Insufficient documentation

## 2020-10-26 MED ORDER — ROPIVACAINE HCL 2 MG/ML IJ SOLN
9.0000 mL | Freq: Once | INTRAMUSCULAR | Status: AC
  Administered 2020-10-26: 20 mL via PERINEURAL

## 2020-10-26 MED ORDER — DIAZEPAM 5 MG PO TABS
ORAL_TABLET | ORAL | Status: AC
  Filled 2020-10-26: qty 1

## 2020-10-26 MED ORDER — ROPIVACAINE HCL 2 MG/ML IJ SOLN: 9.0000 mL | Freq: Once | INTRAMUSCULAR | Status: DC

## 2020-10-26 MED ORDER — ROPIVACAINE HCL 2 MG/ML IJ SOLN
INTRAMUSCULAR | Status: AC
  Filled 2020-10-26: qty 20

## 2020-10-26 MED ORDER — DIAZEPAM 5 MG PO TABS: 5.0000 mg | ORAL_TABLET | Freq: Once | ORAL | Status: DC

## 2020-10-26 NOTE — Patient Instructions (Signed)

## 2020-10-26 NOTE — Progress Notes (Signed)
Safety precautions to be maintained throughout the outpatient stay will include: orient to surroundings, keep bed in low position, maintain call bell within reach at all times, provide assistance with transfer out of bed and ambulation.  

## 2020-10-26 NOTE — Progress Notes (Signed)
PROVIDER NOTE: Information contained herein reflects review and annotations entered in association with encounter. Interpretation of such information and data should be left to medically-trained personnel. Information provided to patient can be located elsewhere in the medical record under "Patient Instructions". Document created using STT-dictation technology, any transcriptional errors that may result from process are unintentional.    Patient: Brianna Arnold  Service Category: Procedure  Provider: Edward Jolly, MD  DOB: 24-Apr-1990  DOS: 10/26/2020  Location: [facility]  MRN: [id number]  Setting: Ambulatory - outpatient  Referring Provider: Marguarite Arbour, MD  Type: Established Patient  Specialty: Interventional Pain Management  PCP: Marguarite Arbour, MD   Primary Reason for Visit: Interventional Pain Management Treatment. CC: Back Pain  Procedure:          Anesthesia, Analgesia, Anxiolysis:  Type: Lumbar Paraspinal Muscle Trigger Point Injection (3+)         Quadratus lumborum, erector spinae, multifidus and left trapzeius and periscapular region and Right cervical/trapezius TPI CPT: 20553 with fluoroscopy Primary Purpose: Therapeutic Approach: Percutaneous, ipsilateral approach. Laterality: Midline        Type: Local Anesthesia with PO Valium for anxiety   Position: Prone   Indications: 1. Myofascial pain syndrome of lumbar spine   2. Chronic pain syndrome    Pain Score: Pre-procedure: 7 /10 Post-procedure: 7 /10   Pre-op H&P Assessment:  Brianna Arnold is a 30 y.o. (year old), female patient, seen today for interventional treatment. She  has a past surgical history that includes Cervical spine surgery (07/2014); Back surgery (10/2014); and Anterior cervical decomp/discectomy fusion (N/A, 02/11/2019). [name] has a current medication list which includes the following prescription(s): albuterol, buprenorphine, butalbital-acetaminophen-caffeine, vitamin d3,  cyclobenzaprine, ergocalciferol, famotidine, ferrous sulfate, gabapentin, medroxyprogesterone, promethazine, tramadol, trazodone, and vitamin b-12, and the following Facility-Administered Medications: diazepam and ropivacaine (pf) 2 mg/ml (0.2%). Her primarily concern today is the Back Pain  Initial Vital Signs:  Pulse/HCG Rate: (!) 104ECG Heart Rate: (!) 101 Temp: 97.7 F (36.5 C) Resp: 16 BP: (!) 130/91 SpO2: 99 %  BMI: Estimated body mass index is 34.24 kg/m as calculated from the following:   Height as of this encounter: 4' 7.5" (1.41 m).   Weight as of this encounter: 150 lb (68 kg).  Risk Assessment: Allergies: Reviewed. She is allergic to hypafix [wound dressings], ativan [lorazepam], and tizanidine.  Allergy Precautions: None required Coagulopathies: Reviewed. None identified.  Blood-thinner therapy: None at this time Active Infection(s): Reviewed. None identified. Brianna Arnold is afebrile  Site Confirmation: Brianna Arnold was asked to confirm the procedure and laterality before marking the site Procedure checklist: Completed Consent: Before the procedure and under the influence of no sedative(s), amnesic(s), or anxiolytics, the patient was informed of the treatment options, risks and possible complications. To fulfill our ethical and legal obligations, as recommended by the American Medical Association's Code of Ethics, I have informed the patient of my clinical impression; the nature and purpose of the treatment or procedure; the risks, benefits, and possible complications of the intervention; the alternatives, including doing nothing; the risk(s) and benefit(s) of the alternative treatment(s) or procedure(s); and the risk(s) and benefit(s) of doing nothing. The patient was provided information about the general risks and possible complications associated with the procedure. These may include, but are not limited to: failure to achieve desired goals, infection, bleeding, organ or nerve  damage, allergic reactions, paralysis, and death. In addition, the patient was informed of those risks and complications associated to the procedure, such  as failure to decrease pain; infection; bleeding; organ or nerve damage with subsequent damage to sensory, motor, and/or autonomic systems, resulting in permanent pain, numbness, and/or weakness of one or several areas of the body; allergic reactions; (i.e.: anaphylactic reaction); and/or death. Furthermore, the patient was informed of those risks and complications associated with the medications. These include, but are not limited to: allergic reactions (i.e.: anaphylactic or anaphylactoid reaction(s)); adrenal axis suppression; blood sugar elevation that in diabetics may result in ketoacidosis or comma; water retention that in patients with history of congestive heart failure may result in shortness of breath, pulmonary edema, and decompensation with resultant heart failure; weight gain; swelling or edema; medication-induced neural toxicity; particulate matter embolism and blood vessel occlusion with resultant organ, and/or nervous system infarction; and/or aseptic necrosis of one or more joints. Finally, the patient was informed that Medicine is not an exact science; therefore, there is also the possibility of unforeseen or unpredictable risks and/or possible complications that may result in a catastrophic outcome. The patient indicated having understood very clearly. We have given the patient no guarantees and we have made no promises. Enough time was given to the patient to ask questions, all of which were answered to the patient's satisfaction. Ms. [name] has indicated that she wanted to continue with the procedure. Attestation: I, the ordering provider, attest that I have discussed with the patient the benefits, risks, side-effects, alternatives, likelihood of achieving goals, and potential problems during recovery for the procedure that I have provided  informed consent. Date  Time: 10/26/2020  2:18 PM  Pre-Procedure Preparation:  Monitoring: As per clinic protocol. Respiration, ETCO2, SpO2, BP, heart rate and rhythm monitor placed and checked for adequate function Safety Precautions: Patient was assessed for positional comfort and pressure points before starting the procedure. Time-out: I initiated and conducted the "Time-out" before starting the procedure, as per protocol. The patient was asked to participate by confirming the accuracy of the "Time Out" information. Verification of the correct person, site, and procedure were performed and confirmed by me, the nursing staff, and the patient. "Time-out" conducted as per Joint Commission's Universal Protocol (UP.01.01.01). Time: 1444  Description of Procedure:          Area Prepped: Entire Lower Lumbosacral Region & Right cervical region DuraPrep (Iodine Povacrylex [0.7% available iodine] and Isopropyl Alcohol, 74% w/w) Safety Precautions: Aspiration looking for blood return was conducted prior to all injections. At no point did we inject any substances, as a needle was being advanced. No attempts were made at seeking any paresthesias. Safe injection practices and needle disposal techniques used. Medications properly checked for expiration dates. SDV (single dose vial) medications used. Description of the Procedure: Protocol guidelines were followed. The patient was placed in position over the fluoroscopy table. The target area was identified and the area prepped in the usual manner. Skin & deeper tissues infiltrated with local anesthetic. Appropriate amount of time allowed to pass for local anesthetics to take effect. The procedure needles were then advanced to the target area. Proper needle placement secured. Negative aspiration confirmed. Solution injected in intermittent fashion, asking for systemic symptoms every 0.5cc of injectate. The needles were then removed and the area cleansed, making sure  to leave some of the prepping solution back to take advantage of its long term bactericidal properties.  Vitals:   10/26/20 1419 10/26/20 1441 10/26/20 1444 10/26/20 1454  BP: (!) 130/91 (!) 134/112 (!) 138/108 (!) 137/111  Pulse: (!) 104     Resp: 16  16  Temp: 97.7 F (36.5 C)     TempSrc: Temporal     SpO2: 99% 100% 98%   Weight: 150 lb (68 kg)     Height: 4' 7.5" (1.41 m)       Start Time: 1444 hrs. End Time: 1451 hrs. Materials:  Needle(s) Type: Regular needle Gauge: 27G Length: 1.5-in Medication(s): Please see orders for medications and dosing details.  Approximately 10 trigger point injected in the left lower lumbar region with 0.5 to 1 cc of 0.2% ropivacaine with needling performed.  Approximately 6 trigger points were also injected in the right cervical, right trapezius region with dry needling performed.  Each trigger point was injected with 0.5 to 1 cc of 0.2% ropivacaine. Imaging Guidance:          Type of Imaging Technique: Fluoroscopy Guidance (Spinal) Indication(s): Assistance in needle guidance and placement for procedures requiring needle placement in or near specific anatomical locations not easily accessible without such assistance. Exposure Time: No patient exposure Contrast: None used. Fluoroscopic Guidance: I was personally present during the use of fluoroscopy. "Tunnel Vision Technique" used to obtain the best possible view of the target area. Parallax error corrected before commencing the procedure. "Direction-depth-direction" technique used to introduce the needle under continuous pulsed fluoroscopy. Once target was reached, antero-posterior, oblique, and lateral fluoroscopic projection used confirm needle placement in all planes. Images permanently stored in EMR. Ultrasound Guidance: N/A Interpretation: I personally interpreted the imaging intraoperatively. Adequate needle placement confirmed in multiple planes. Appropriate spread of contrast into desired  area was observed. No evidence of afferent or efferent intravascular uptake. No intrathecal or subarachnoid spread observed. Permanent images saved into the patient's record.  Post-operative Assessment:  Post-procedure Vital Signs:  Pulse/HCG Rate: (!) 104(!) 114 Temp: 97.7 F (36.5 C) Resp: 16 BP: (!) 137/111 SpO2: 98 %  EBL: None  Complications: No immediate post-treatment complications observed by team, or reported by patient.  Note: The patient tolerated the entire procedure well. A repeat set of vitals were taken after the procedure and the patient was kept under observation following institutional policy, for this type of procedure. Post-procedural neurological assessment was performed, showing return to baseline, prior to discharge. The patient was provided with post-procedure discharge instructions, including a section on how to identify potential problems. Should any problems arise concerning this procedure, the patient was given instructions to immediately contact us, at any time, without hesitation. In any case, we plan to contact the patient by telephone for a follow-up status report regarding this interventional procedure.  Comments:  No additional relevant information.  Plan of Care  Orders:  Orders Placed This Encounter  Procedures   DG PAIN [facility] C-ARM 1-60 MIN NO REPORT    Intraoperative interpretation by procedural physician at [facility].    Standing Status:   Standing    Number of Occurrences:   1    Order Specific Question:   Reason for exam:    Answer:   Assistance in needle guidance and placement for procedures requiring needle placement in or near specific anatomical locations not easily accessible without such assistance.    Medications ordered for procedure: Meds ordered this encounter  Medications   ropivacaine (PF) 2 mg/mL (0.2%) (NAROPIN) injection 9 mL   ropivacaine (PF) 2 mg/mL (0.2%) (NAROPIN) injection 9 mL   diazepam (VALIUM) tablet 5  mg   Medications administered: We administered ropivacaine (PF) 2 mg/mL (0.2%).  See the medical record for exact dosing, route, and time of  administration.  Follow-up plan:   Return for Keep sch. appt.       Pharmacological management options:  Opioid Analgesics: Butrans patch, side effects at 7.5 mcg an hour, analgesic benefit with no side effects at 5 mcg an hour, continue  Membrane stabilizer: Continue gabapentin as prescribed  Muscle relaxant: Continue Flexeril as prescribed  NSAID: To be determined at a later time  Other analgesic(s): To be determined at a later time    Interventional management options: Ms. Grigg was informed that there is no guarantee that she would be a candidate for interventional therapies. The decision will be based on the results of diagnostic studies, as well as Ms. Deer's risk profile.  Procedure(s) under consideration:  Left axillary peripheral nerve stimulation: 12/30/19 providing significant pain relief.  Removed 02/29/2020              Recent Visits Date Type Provider Dept  10/20/20 Office Visit Edward Jolly, MD [facility]  09/12/20 Procedure visit Edward Jolly, MD [facility]  09/08/20 Office Visit Edward Jolly, MD [facility]  08/08/20 Procedure visit Edward Jolly, MD [facility]  Showing recent visits within past 90 days and meeting all other requirements Today's Visits Date Type Provider Dept  10/26/20 Procedure visit Edward Jolly, MD [facility]  Showing today's visits and meeting all other requirements Future Appointments Date Type Provider Dept  12/15/20 Appointment Edward Jolly, MD [facility]  Showing future appointments within next 90 days and meeting all other requirements Disposition: Discharge home  Discharge (Date  Time): 10/26/2020;   hrs.   Primary Care Physician: Marguarite Arbour, MD Location: [facility] Note by: Edward Jolly,  MD Date: 10/26/2020; Time: 2:57 PM  Disclaimer:  Medicine is not an exact science. The only guarantee in medicine is that nothing is guaranteed. It is important to note that the decision to proceed with this intervention was based on the information collected from the patient. The Data and conclusions were drawn from the patient's questionnaire, the interview, and the physical examination. Because the information was provided in large part by the patient, it cannot be guaranteed that it has not been purposely or unconsciously manipulated. Every effort has been made to obtain as much relevant data as possible for this evaluation. It is important to note that the conclusions that lead to this procedure are derived in large part from the available data. Always take into account that the treatment will also be dependent on availability of resources and existing treatment guidelines, considered by other Pain Management Practitioners as being common knowledge and practice, at the time of the intervention. For Medico-Legal purposes, it is also important to point out that variation in procedural techniques and pharmacological choices are the acceptable norm. The indications, contraindications, technique, and results of the above procedure should only be interpreted and judged by a Board-Certified Interventional Pain Specialist with extensive familiarity and expertise in the same exact procedure and technique.

## 2020-11-03 ENCOUNTER — Ambulatory Visit
Admission: RE | Admit: 2020-11-03 | Discharge: 2020-11-03 | Disposition: A | Discharge status: Home / Self Care | Payer: Medicare HMO | Source: Ambulatory Visit | Attending: Neurosurgery | Admitting: Neurosurgery

## 2020-11-03 ENCOUNTER — Ambulatory Visit: Payer: Medicare HMO

## 2020-11-03 ENCOUNTER — Other Ambulatory Visit: Payer: Self-pay

## 2020-11-03 DIAGNOSIS — Z981 Arthrodesis status: Secondary | ICD-10-CM

## 2020-11-03 DIAGNOSIS — G959 Disease of spinal cord, unspecified: Secondary | ICD-10-CM

## 2020-11-03 DIAGNOSIS — Q798 Other congenital malformations of musculoskeletal system: Secondary | ICD-10-CM

## 2020-11-03 DIAGNOSIS — G894 Chronic pain syndrome: Secondary | ICD-10-CM | POA: Diagnosis present

## 2020-11-16 ENCOUNTER — Other Ambulatory Visit: Payer: Self-pay

## 2020-11-16 ENCOUNTER — Encounter: Payer: Self-pay | Admitting: Student in an Organized Health Care Education/Training Program

## 2020-11-16 ENCOUNTER — Ambulatory Visit
Admission: RE | Admit: 2020-11-16 | Discharge: 2020-11-16 | Disposition: A | Discharge status: Home / Self Care | Payer: Medicare HMO | Source: Ambulatory Visit | Attending: Student in an Organized Health Care Education/Training Program | Admitting: Student in an Organized Health Care Education/Training Program

## 2020-11-16 ENCOUNTER — Ambulatory Visit (HOSPITAL_BASED_OUTPATIENT_CLINIC_OR_DEPARTMENT_OTHER): Payer: Medicare HMO | Admitting: Student in an Organized Health Care Education/Training Program

## 2020-11-16 VITALS — BP 140/104 | HR 111 | Temp 97.7°F | Resp 16 | Ht <= 58 in | Wt 154.0 lb

## 2020-11-16 DIAGNOSIS — M7918 Myalgia, other site: Secondary | ICD-10-CM

## 2020-11-16 DIAGNOSIS — G894 Chronic pain syndrome: Secondary | ICD-10-CM | POA: Insufficient documentation

## 2020-11-16 MED ORDER — DIAZEPAM 5 MG PO TABS
5.0000 mg | ORAL_TABLET | Freq: Once | ORAL | Status: AC
  Administered 2020-11-16: 5 mg via ORAL

## 2020-11-16 MED ORDER — ROPIVACAINE HCL 2 MG/ML IJ SOLN
INTRAMUSCULAR | Status: AC
  Filled 2020-11-16: qty 20

## 2020-11-16 MED ORDER — DIAZEPAM 5 MG PO TABS
ORAL_TABLET | ORAL | Status: AC
  Filled 2020-11-16: qty 1

## 2020-11-16 MED ORDER — ROPIVACAINE HCL 2 MG/ML IJ SOLN
9.0000 mL | Freq: Once | INTRAMUSCULAR | Status: AC
  Administered 2020-11-16: 20 mL via PERINEURAL

## 2020-11-16 NOTE — Progress Notes (Signed)
Safety precautions to be maintained throughout the outpatient stay will include: orient to surroundings, keep bed in low position, maintain call bell within reach at all times, provide assistance with transfer out of bed and ambulation.  

## 2020-11-16 NOTE — Patient Instructions (Signed)

## 2020-11-16 NOTE — Progress Notes (Signed)
PROVIDER NOTE: Information contained herein reflects review and annotations entered in association with encounter. Interpretation of such information and data should be left to medically-trained personnel. Information provided to patient can be located elsewhere in the medical record under "Patient Instructions". Document created using STT-dictation technology, any transcriptional errors that may result from process are unintentional.    Patient: Brianna Arnold  Service Category: Procedure  Provider: Edward Jolly, MD  DOB: 11-May-1990  DOS: 11/16/2020  Location: [facility]  MRN: [id number]  Setting: Ambulatory - outpatient  Referring Provider: Marguarite Arbour, MD  Type: Established Patient  Specialty: Interventional Pain Management  PCP: Marguarite Arbour, MD   Primary Reason for Visit: Interventional Pain Management Treatment. CC: Neck Pain and Back Pain (Upper, lower)  Procedure:          Anesthesia, Analgesia, Anxiolysis:  Type: Lumbar Paraspinal Muscle Trigger Point Injection (3+)         Quadratus lumborum, erector spinae, multifidus and left trapzeius and periscapular region and Right cervical/trapezius TPI CPT: 20553 with fluoroscopy Primary Purpose: Therapeutic Approach: Percutaneous, ipsilateral approach. Laterality: Midline        Type: Local Anesthesia with PO Valium for anxiety   Position: Prone   Indications: 1. Myofascial pain syndrome of lumbar spine   2. Chronic pain syndrome    Pain Score: Pre-procedure: 7 /10 Post-procedure: 4 /10   Pre-op H&P Assessment:  Brianna Arnold is a 30 y.o. (year old), female patient, seen today for interventional treatment. She  has a past surgical history that includes Cervical spine surgery (07/2014); Back surgery (10/2014); and Anterior cervical decomp/discectomy fusion (N/A, 02/11/2019). Brianna Arnold has a current medication list which includes the following prescription(s): albuterol, buprenorphine,  butalbital-acetaminophen-caffeine, vitamin d3, cyclobenzaprine, ergocalciferol, famotidine, ferrous sulfate, gabapentin, medroxyprogesterone, promethazine, tramadol, trazodone, and vitamin b-12. Her primarily concern today is the Neck Pain and Back Pain (Upper, lower)  Initial Vital Signs:  Pulse/HCG Rate: (!) 111  Temp: 97.7 F (36.5 C) Resp: 16 BP: (!) 146/97 SpO2: 100 %  BMI: Estimated body mass index is 35.79 kg/m as calculated from the following:   Height as of this encounter: 4\' 7"  (1.397 m).   Weight as of this encounter: 154 lb (69.9 kg).  Risk Assessment: Allergies: Reviewed. She is allergic to hypafix [wound dressings], ativan [lorazepam], and tizanidine.  Allergy Precautions: None required Coagulopathies: Reviewed. None identified.  Blood-thinner therapy: None at this time Active Infection(s): Reviewed. None identified. [name] is afebrile  Site Confirmation: Brianna Arnold was asked to confirm the procedure and laterality before marking the site Procedure checklist: Completed Consent: Before the procedure and under the influence of no sedative(s), amnesic(s), or anxiolytics, the patient was informed of the treatment options, risks and possible complications. To fulfill our ethical and legal obligations, as recommended by the American Medical Association's Code of Ethics, I have informed the patient of my clinical impression; the nature and purpose of the treatment or procedure; the risks, benefits, and possible complications of the intervention; the alternatives, including doing nothing; the risk(s) and benefit(s) of the alternative treatment(s) or procedure(s); and the risk(s) and benefit(s) of doing nothing. The patient was provided information about the general risks and possible complications associated with the procedure. These may include, but are not limited to: failure to achieve desired goals, infection, bleeding, organ or nerve damage, allergic reactions, paralysis, and  death. In addition, the patient was informed of those risks and complications associated to the procedure, such as failure to decrease pain;  infection; bleeding; organ or nerve damage with subsequent damage to sensory, motor, and/or autonomic systems, resulting in permanent pain, numbness, and/or weakness of one or several areas of the body; allergic reactions; (i.e.: anaphylactic reaction); and/or death. Furthermore, the patient was informed of those risks and complications associated with the medications. These include, but are not limited to: allergic reactions (i.e.: anaphylactic or anaphylactoid reaction(s)); adrenal axis suppression; blood sugar elevation that in diabetics may result in ketoacidosis or comma; water retention that in patients with history of congestive heart failure may result in shortness of breath, pulmonary edema, and decompensation with resultant heart failure; weight gain; swelling or edema; medication-induced neural toxicity; particulate matter embolism and blood vessel occlusion with resultant organ, and/or nervous system infarction; and/or aseptic necrosis of one or more joints. Finally, the patient was informed that Medicine is not an exact science; therefore, there is also the possibility of unforeseen or unpredictable risks and/or possible complications that may result in a catastrophic outcome. The patient indicated having understood very clearly. We have given the patient no guarantees and we have made no promises. Enough time was given to the patient to ask questions, all of which were answered to the patient's satisfaction. Brianna Arnold has indicated that she wanted to continue with the procedure. Attestation: I, the ordering provider, attest that I have discussed with the patient the benefits, risks, side-effects, alternatives, likelihood of achieving goals, and potential problems during recovery for the procedure that I have provided informed consent. Date  Time: 11/16/2020   2:41 PM  Pre-Procedure Preparation:  Monitoring: As per clinic protocol. Respiration, ETCO2, SpO2, BP, heart rate and rhythm monitor placed and checked for adequate function Safety Precautions: Patient was assessed for positional comfort and pressure points before starting the procedure. Time-out: I initiated and conducted the "Time-out" before starting the procedure, as per protocol. The patient was asked to participate by confirming the accuracy of the "Time Out" information. Verification of the correct person, site, and procedure were performed and confirmed by me, the nursing staff, and the patient. "Time-out" conducted as per Joint Commission's Universal Protocol (UP.01.01.01). Time: 1507  Description of Procedure:          Area Prepped: Entire Lower Lumbosacral Region & Right cervical region DuraPrep (Iodine Povacrylex [0.7% available iodine] and Isopropyl Alcohol, 74% w/w) Safety Precautions: Aspiration looking for blood return was conducted prior to all injections. At no point did we inject any substances, as a needle was being advanced. No attempts were made at seeking any paresthesias. Safe injection practices and needle disposal techniques used. Medications properly checked for expiration dates. SDV (single dose vial) medications used. Description of the Procedure: Protocol guidelines were followed. The patient was placed in position over the fluoroscopy table. The target area was identified and the area prepped in the usual manner. Skin & deeper tissues infiltrated with local anesthetic. Appropriate amount of time allowed to pass for local anesthetics to take effect. The procedure needles were then advanced to the target area. Proper needle placement secured. Negative aspiration confirmed. Solution injected in intermittent fashion, asking for systemic symptoms every 0.5cc of injectate. The needles were then removed and the area cleansed, making sure to leave some of the prepping solution back  to take advantage of its long term bactericidal properties.  Vitals:   11/16/20 1447 11/16/20 1518  BP: (!) 146/97 (!) 140/104  Pulse: (!) 111   Resp: 16   Temp: 97.7 F (36.5 C)   TempSrc: Temporal   SpO2: 100%  Weight: 154 lb (69.9 kg)   Height: 4\' 7"  (1.397 m)     Start Time: 0517 hrs. End Time: 1517 hrs. Materials:  Needle(s) Type: Regular needle Gauge: 27G Length: 1.5-in Medication(s): Please see orders for medications and dosing details.  Approximately 10 trigger point injected in the left lower lumbar region with 0.5 to 1 cc of 0.2% ropivacaine with needling performed.  Approximately 6 trigger points were also injected in the right cervical, right trapezius region with dry needling performed.  Each trigger point was injected with 0.5 to 1 cc of 0.2% ropivacaine. Imaging Guidance:          Type of Imaging Technique: Fluoroscopy Guidance (Spinal) Indication(s): Assistance in needle guidance and placement for procedures requiring needle placement in or near specific anatomical locations not easily accessible without such assistance. Exposure Time: No patient exposure Contrast: None used. Fluoroscopic Guidance: I was personally present during the use of fluoroscopy. "Tunnel Vision Technique" used to obtain the best possible view of the target area. Parallax error corrected before commencing the procedure. "Direction-depth-direction" technique used to introduce the needle under continuous pulsed fluoroscopy. Once target was reached, antero-posterior, oblique, and lateral fluoroscopic projection used confirm needle placement in all planes. Images permanently stored in EMR. Ultrasound Guidance: N/A Interpretation: I personally interpreted the imaging intraoperatively. Adequate needle placement confirmed in multiple planes. Appropriate spread of contrast into desired area was observed. No evidence of afferent or efferent intravascular uptake. No intrathecal or subarachnoid spread  observed. Permanent images saved into the patient's record.  Post-operative Assessment:  Post-procedure Vital Signs:  Pulse/HCG Rate: (!) 111  Temp: 97.7 F (36.5 C) Resp: 16 BP: (!) 140/104 SpO2: 100 %  EBL: None  Complications: No immediate post-treatment complications observed by team, or reported by patient.  Note: The patient tolerated the entire procedure well. A repeat set of vitals were taken after the procedure and the patient was kept under observation following institutional policy, for this type of procedure. Post-procedural neurological assessment was performed, showing return to baseline, prior to discharge. The patient was provided with post-procedure discharge instructions, including a section on how to identify potential problems. Should any problems arise concerning this procedure, the patient was given instructions to immediately contact , at any time, without hesitation. In any case, we plan to contact the patient by telephone for a follow-up status report regarding this interventional procedure.  Comments:  No additional relevant information.  Plan of Care  Orders:  Orders Placed This Encounter  Procedures   DG PAIN CLIN[facility] C-ARM 1-60 MIN NO REPORT    Intraoperative interpretation by procedural physician at [facility].    Standing Status:   Standing    Number of Occurrences:   1    Order Specific Question:   Reason for exam:    Answer:   Assistance in needle guidance and placement for procedures requiring needle placement in or near specific anatomical locations not easily accessible without such assistance.    Medications ordered for procedure: Meds ordered this encounter  Medications   ropivacaine (PF) 2 mg/mL (0.2%) (NAROPIN) injection 9 mL   diazepam (VALIUM) tablet 5 mg   Medications administered: We administered ropivacaine (PF) 2 mg/mL (0.2%) and diazepam.  See the medical record for exact dosing, route, and time of  administration.  Follow-up plan:   Return for Keep sch. appt.       Pharmacological management options:  Opioid Analgesics: Butrans patch, side effects at 7.5 mcg an hour, analgesic benefit with no  side effects at 5 mcg an hour, continue  Membrane stabilizer: Continue gabapentin as prescribed  Muscle relaxant: Continue Flexeril as prescribed  NSAID: To be determined at a later time  Other analgesic(s): To be determined at a later time    Interventional management options: Ms. Esterline was informed that there is no guarantee that she would be a candidate for interventional therapies. The decision will be based on the results of diagnostic studies, as well as Ms. Rinker's risk profile.  Procedure(s) hx Left axillary peripheral nerve stimulation: 12/30/19 providing significant pain relief.  Removed 02/29/2020              Recent Visits Date Type Provider Dept  10/26/20 Procedure visit Edward Jolly, MD [facility]  10/20/20 Office Visit Edward Jolly, MD [facility]  09/12/20 Procedure visit Edward Jolly, MD [facility]  09/08/20 Office Visit Edward Jolly, MD [facility]  Showing recent visits within past 90 days and meeting all other requirements Today's Visits Date Type Provider Dept  11/16/20 Procedure visit Edward Jolly, MD [facility]  Showing today's visits and meeting all other requirements Future Appointments Date Type Provider Dept  12/07/20 Appointment Edward Jolly, MD [facility]  12/15/20 Appointment Edward Jolly, MD [facility]  Showing future appointments within next 90 days and meeting all other requirements Disposition: Discharge home  Discharge (Date  Time): 11/16/2020; 1520 hrs.   Primary Care Physician: Marguarite Arbour, MD Location: [facility] Note by: Edward Jolly, MD Date: 11/16/2020; Time: 3:28 PM  Disclaimer:  Medicine is not an exact science. The only  guarantee in medicine is that nothing is guaranteed. It is important to note that the decision to proceed with this intervention was based on the information collected from the patient. The Data and conclusions were drawn from the patient's questionnaire, the interview, and the physical examination. Because the information was provided in large part by the patient, it cannot be guaranteed that it has not been purposely or unconsciously manipulated. Every effort has been made to obtain as much relevant data as possible for this evaluation. It is important to note that the conclusions that lead to this procedure are derived in large part from the available data. Always take into account that the treatment will also be dependent on availability of resources and existing treatment guidelines, considered by other Pain Management Practitioners as being common knowledge and practice, at the time of the intervention. For Medico-Legal purposes, it is also important to point out that variation in procedural techniques and pharmacological choices are the acceptable norm. The indications, contraindications, technique, and results of the above procedure should only be interpreted and judged by a Board-Certified Interventional Pain Specialist with extensive familiarity and expertise in the same exact procedure and technique.

## 2020-11-17 ENCOUNTER — Telehealth: Payer: Self-pay | Admitting: *Deleted

## 2020-11-17 NOTE — Telephone Encounter (Signed)
Denies any post procedure issues. 

## 2020-11-23 ENCOUNTER — Ambulatory Visit
Admission: RE | Admit: 2020-11-23 | Discharge: 2020-11-23 | Disposition: A | Discharge status: Home / Self Care | Payer: Medicare HMO | Source: Ambulatory Visit | Attending: Neurosurgery | Admitting: Neurosurgery

## 2020-11-23 ENCOUNTER — Other Ambulatory Visit: Payer: Self-pay

## 2020-11-23 DIAGNOSIS — G959 Disease of spinal cord, unspecified: Secondary | ICD-10-CM | POA: Diagnosis present

## 2020-11-23 DIAGNOSIS — Q798 Other congenital malformations of musculoskeletal system: Secondary | ICD-10-CM | POA: Diagnosis present

## 2020-11-23 DIAGNOSIS — Z981 Arthrodesis status: Secondary | ICD-10-CM | POA: Diagnosis present

## 2020-11-23 DIAGNOSIS — G894 Chronic pain syndrome: Secondary | ICD-10-CM

## 2020-12-07 ENCOUNTER — Ambulatory Visit (HOSPITAL_BASED_OUTPATIENT_CLINIC_OR_DEPARTMENT_OTHER): Payer: Medicare HMO | Admitting: Student in an Organized Health Care Education/Training Program

## 2020-12-07 ENCOUNTER — Ambulatory Visit
Admission: RE | Admit: 2020-12-07 | Discharge: 2020-12-07 | Disposition: A | Discharge status: Home / Self Care | Payer: Medicare HMO | Source: Ambulatory Visit | Attending: Student in an Organized Health Care Education/Training Program | Admitting: Student in an Organized Health Care Education/Training Program

## 2020-12-07 ENCOUNTER — Other Ambulatory Visit: Payer: Self-pay

## 2020-12-07 ENCOUNTER — Other Ambulatory Visit: Payer: Self-pay | Admitting: Student in an Organized Health Care Education/Training Program

## 2020-12-07 ENCOUNTER — Encounter: Payer: Self-pay | Admitting: Student in an Organized Health Care Education/Training Program

## 2020-12-07 VITALS — BP 129/86 | HR 100 | Temp 97.7°F | Resp 18 | Ht <= 58 in | Wt 156.0 lb

## 2020-12-07 DIAGNOSIS — M4105 Infantile idiopathic scoliosis, thoracolumbar region: Secondary | ICD-10-CM

## 2020-12-07 DIAGNOSIS — M542 Cervicalgia: Secondary | ICD-10-CM | POA: Insufficient documentation

## 2020-12-07 DIAGNOSIS — M7918 Myalgia, other site: Secondary | ICD-10-CM | POA: Insufficient documentation

## 2020-12-07 DIAGNOSIS — G894 Chronic pain syndrome: Secondary | ICD-10-CM

## 2020-12-07 MED ORDER — DIAZEPAM 5 MG PO TABS
5.0000 mg | ORAL_TABLET | Freq: Once | ORAL | Status: AC
  Administered 2020-12-07: 5 mg via ORAL

## 2020-12-07 MED ORDER — DIAZEPAM 5 MG PO TABS
ORAL_TABLET | ORAL | Status: AC
  Filled 2020-12-07: qty 1

## 2020-12-07 MED ORDER — ROPIVACAINE HCL 2 MG/ML IJ SOLN
9.0000 mL | Freq: Once | INTRAMUSCULAR | Status: AC
  Administered 2020-12-07: 20 mL via PERINEURAL

## 2020-12-07 NOTE — Progress Notes (Signed)
PROVIDER NOTE: Information contained herein reflects review and annotations entered in association with encounter. Interpretation of such information and data should be left to medically-trained personnel. Information provided to patient can be located elsewhere in the medical record under "Patient Instructions". Document created using STT-dictation technology, any transcriptional errors that may result from process are unintentional.    Patient: Brianna Arnold  Service Category: Procedure  Provider: Edward Jolly, MD  DOB: 06-26-1990  DOS: 12/07/2020  Location: [facility]  MRN: [id number]  Setting: Ambulatory - outpatient  Referring Provider: Marguarite Arbour, MD  Type: Established Patient  Specialty: Interventional Pain Management  PCP: Marguarite Arbour, MD   Primary Reason for Visit: Interventional Pain Management Treatment. CC: Back Pain and Neck Pain (Left and right)  Procedure:          Anesthesia, Analgesia, Anxiolysis:  Type: Lumbar Paraspinal Muscle Trigger Point Injection (3+)         Quadratus lumborum, erector spinae, multifidus and left trapzeius and periscapular region and Right cervical/trapezius TPI CPT: 20553 with fluoroscopy Primary Purpose: Therapeutic Approach: Percutaneous, ipsilateral approach. Laterality: Midline        Type: Local Anesthesia with PO Valium for anxiety   Position: Prone   Indications: 1. Myofascial pain syndrome of lumbar spine   2. Chronic pain syndrome   3. Infantile idiopathic scoliosis of thoracolumbar region   4. Cervicalgia     Pain Score: Pre-procedure: 6 /10 Post-procedure: 3 /10   Pre-op H&P Assessment:  Brianna Arnold is a 30 y.o. (year old), female patient, seen today for interventional treatment. She  has a past surgical history that includes Cervical spine surgery (07/2014); Back surgery (10/2014); and Anterior cervical decomp/discectomy fusion (N/A, 02/11/2019). Brianna Arnold has a current medication list which  includes the following prescription(s): albuterol, buprenorphine, butalbital-acetaminophen-caffeine, vitamin d3, cyclobenzaprine, ergocalciferol, famotidine, ferrous sulfate, gabapentin, medroxyprogesterone, promethazine, tramadol, trazodone, and vitamin b-12. Her primarily concern today is the Back Pain and Neck Pain (Left and right)  Initial Vital Signs:  Pulse/HCG Rate: 100ECG Heart Rate: 90 Temp: 97.7 F (36.5 C) Resp: 15 BP: 123/83 SpO2: 100 %  BMI: Estimated body mass index is 35.61 kg/m as calculated from the following:   Height as of this encounter: 4' 7.5" (1.41 m).   Weight as of this encounter: 156 lb (70.8 kg).  Risk Assessment: Allergies: Reviewed. She is allergic to hypafix [wound dressings], ativan [lorazepam], and tizanidine.  Allergy Precautions: None required Coagulopathies: Reviewed. None identified.  Blood-thinner therapy: None at this time Active Infection(s): Reviewed. None identified. Brianna Arnold is afebrile  Site Confirmation: Brianna Arnold was asked to confirm the procedure and laterality before marking the site Procedure checklist: Completed Consent: Before the procedure and under the influence of no sedative(s), amnesic(s), or anxiolytics, the patient was informed of the treatment options, risks and possible complications. To fulfill our ethical and legal obligations, as recommended by the American Medical Association's Code of Ethics, I have informed the patient of my clinical impression; the nature and purpose of the treatment or procedure; the risks, benefits, and possible complications of the intervention; the alternatives, including doing nothing; the risk(s) and benefit(s) of the alternative treatment(s) or procedure(s); and the risk(s) and benefit(s) of doing nothing. The patient was provided information about the general risks and possible complications associated with the procedure. These may include, but are not limited to: failure to achieve desired goals,  infection, bleeding, organ or nerve damage, allergic reactions, paralysis, and death. In addition, the patient was  informed of those risks and complications associated to the procedure, such as failure to decrease pain; infection; bleeding; organ or nerve damage with subsequent damage to sensory, motor, and/or autonomic systems, resulting in permanent pain, numbness, and/or weakness of one or several areas of the body; allergic reactions; (i.e.: anaphylactic reaction); and/or death. Furthermore, the patient was informed of those risks and complications associated with the medications. These include, but are not limited to: allergic reactions (i.e.: anaphylactic or anaphylactoid reaction(s)); adrenal axis suppression; blood sugar elevation that in diabetics may result in ketoacidosis or comma; water retention that in patients with history of congestive heart failure may result in shortness of breath, pulmonary edema, and decompensation with resultant heart failure; weight gain; swelling or edema; medication-induced neural toxicity; particulate matter embolism and blood vessel occlusion with resultant organ, and/or nervous system infarction; and/or aseptic necrosis of one or more joints. Finally, the patient was informed that Medicine is not an exact science; therefore, there is also the possibility of unforeseen or unpredictable risks and/or possible complications that may result in a catastrophic outcome. The patient indicated having understood very clearly. We have given the patient no guarantees and we have made no promises. Enough time was given to the patient to ask questions, all of which were answered to the patient's satisfaction. Brianna Arnold has indicated that she wanted to continue with the procedure. Attestation: I, the ordering provider, attest that I have discussed with the patient the benefits, risks, side-effects, alternatives, likelihood of achieving goals, and potential problems during recovery  for the procedure that I have provided informed consent. Date  Time: 12/07/2020  2:42 PM  Pre-Procedure Preparation:  Monitoring: As per clinic protocol. Respiration, ETCO2, SpO2, BP, heart rate and rhythm monitor placed and checked for adequate function Safety Precautions: Patient was assessed for positional comfort and pressure points before starting the procedure. Time-out: I initiated and conducted the "Time-out" before starting the procedure, as per protocol. The patient was asked to participate by confirming the accuracy of the "Time Out" information. Verification of the correct person, site, and procedure were performed and confirmed by me, the nursing staff, and the patient. "Time-out" conducted as per Joint Commission's Universal Protocol (UP.01.01.01). Time: 1523  Description of Procedure:          Area Prepped: Entire Lower Lumbosacral Region & Right cervical region DuraPrep (Iodine Povacrylex [0.7% available iodine] and Isopropyl Alcohol, 74% w/w) Safety Precautions: Aspiration looking for blood return was conducted prior to all injections. At no point did we inject any substances, as a needle was being advanced. No attempts were made at seeking any paresthesias. Safe injection practices and needle disposal techniques used. Medications properly checked for expiration dates. SDV (single dose vial) medications used. Description of the Procedure: Protocol guidelines were followed. The patient was placed in position over the fluoroscopy table. The target area was identified and the area prepped in the usual manner. Skin & deeper tissues infiltrated with local anesthetic. Appropriate amount of time allowed to pass for local anesthetics to take effect. The procedure needles were then advanced to the target area. Proper needle placement secured. Negative aspiration confirmed. Solution injected in intermittent fashion, asking for systemic symptoms every 0.5cc of injectate. The needles were then  removed and the area cleansed, making sure to leave some of the prepping solution back to take advantage of its long term bactericidal properties.  Vitals:   12/07/20 1510 12/07/20 1515 12/07/20 1522 12/07/20 1530  BP: (!) 127/102 (!) 130/106 (!) 126/97 129/86  Pulse:      Resp: 18 16 15 18   Temp:      TempSrc:      SpO2: 97% 100% 100% 99%  Weight:      Height:        Start Time: 1523 hrs. End Time: 1529 hrs. Materials:  Needle(s) Type: Regular needle Gauge: 27G Length: 1.5-in  Medication(s): Please see orders for medications and dosing details.  Approximately 10 trigger point injected in the left lower lumbar region with 0.5 to 1 cc of 0.2% ropivacaine with needling performed.  Approximately 6 trigger points were also injected in the right cervical, right trapezius region with dry needling performed.  Each trigger point was injected with 0.5 to 1 cc of 0.2% ropivacaine.  Imaging Guidance:          Type of Imaging Technique: Fluoroscopy Guidance (Spinal) Indication(s): Assistance in needle guidance and placement for procedures requiring needle placement in or near specific anatomical locations not easily accessible without such assistance. Exposure Time: No patient exposure Contrast: None used. Fluoroscopic Guidance: I was personally present during the use of fluoroscopy. "Tunnel Vision Technique" used to obtain the best possible view of the target area. Parallax error corrected before commencing the procedure. "Direction-depth-direction" technique used to introduce the needle under continuous pulsed fluoroscopy. Once target was reached, antero-posterior, oblique, and lateral fluoroscopic projection used confirm needle placement in all planes. Images permanently stored in EMR. Ultrasound Guidance: N/A Interpretation: I personally interpreted the imaging intraoperatively. Adequate needle placement confirmed in multiple planes. Appropriate spread of contrast into desired area was  observed. No evidence of afferent or efferent intravascular uptake. No intrathecal or subarachnoid spread observed. Permanent images saved into the patient's record.  Post-operative Assessment:  Post-procedure Vital Signs:  Pulse/HCG Rate: 10098 Temp: 97.7 F (36.5 C) Resp: 18 BP: 129/86 SpO2: 99 %  EBL: None  Complications: No immediate post-treatment complications observed by team, or reported by patient.  Note: The patient tolerated the entire procedure well. A repeat set of vitals were taken after the procedure and the patient was kept under observation following institutional policy, for this type of procedure. Post-procedural neurological assessment was performed, showing return to baseline, prior to discharge. The patient was provided with post-procedure discharge instructions, including a section on how to identify potential problems. Should any problems arise concerning this procedure, the patient was given instructions to immediately contact , at any time, without hesitation. In any case, we plan to contact the patient by telephone for a follow-up status report regarding this interventional procedure.  Comments:  No additional relevant information.  Plan of Care  Orders:  Orders Placed This Encounter  Procedures   DG PAIN CLINIC C-ARM 1-60 MIN NO REPORT    Intraoperative interpretation by procedural physician at [facility].    Standing Status:   Standing    Number of Occurrences:   1    Order Specific Question:   Reason for exam:    Answer:   Assistance in needle guidance and placement for procedures requiring needle placement in or near specific anatomical locations not easily accessible without such assistance.     Medications ordered for procedure: Meds ordered this encounter  Medications   ropivacaine (PF) 2 mg/mL (0.2%) (NAROPIN) injection 9 mL   ropivacaine (PF) 2 mg/mL (0.2%) (NAROPIN) injection 9 mL   diazepam (VALIUM) tablet 5 mg    Medications  administered: We administered ropivacaine (PF) 2 mg/mL (0.2%), ropivacaine (PF) 2 mg/mL (0.2%), and diazepam.  See the  medical record for exact dosing, route, and time of administration.  Follow-up plan:   No follow-ups on file.       Pharmacological management options:  Opioid Analgesics: Butrans patch, side effects at 7.5 mcg an hour, analgesic benefit with no side effects at 5 mcg an hour, continue  Membrane stabilizer: Continue gabapentin as prescribed  Muscle relaxant: Continue Flexeril as prescribed  NSAID: To be determined at a later time  Other analgesic(s): To be determined at a later time    Interventional management options: Brianna Arnold was informed that there is no guarantee that she would be a candidate for interventional therapies. The decision will be based on the results of diagnostic studies, as well as Brianna Arnold's risk profile.  Procedure(s) hx Left axillary peripheral nerve stimulation: 12/30/19 providing significant pain relief.  Removed 02/29/2020              Recent Visits Date Type Provider Dept  11/16/20 Procedure visit Edward Jolly, MD [facility]  10/26/20 Procedure visit Edward Jolly, MD [facility]  10/20/20 Office Visit Edward Jolly, MD [facility]  09/12/20 Procedure visit Edward Jolly, MD [facility]  09/08/20 Office Visit Edward Jolly, MD [facility]  Showing recent visits within past 90 days and meeting all other requirements Today's Visits Date Type Provider Dept  12/07/20 Procedure visit Edward Jolly, MD [facility]  Showing today's visits and meeting all other requirements Future Appointments Date Type Provider Dept  12/15/20 Appointment Edward Jolly, MD [facility]  12/28/20 Appointment Edward Jolly, MD [facility]  Showing future appointments within next 90 days and meeting all other requirements Disposition: Discharge home  Discharge (Date  Time):  12/07/2020; 1540 hrs.   Primary Care Physician: Marguarite Arbour, MD Location: [facility] Note by: Edward Jolly, MD Date: 12/07/2020; Time: 3:54 PM  Disclaimer:  Medicine is not an exact science. The only guarantee in medicine is that nothing is guaranteed. It is important to note that the decision to proceed with this intervention was based on the information collected from the patient. The Data and conclusions were drawn from the patient's questionnaire, the interview, and the physical examination. Because the information was provided in large part by the patient, it cannot be guaranteed that it has not been purposely or unconsciously manipulated. Every effort has been made to obtain as much relevant data as possible for this evaluation. It is important to note that the conclusions that lead to this procedure are derived in large part from the available data. Always take into account that the treatment will also be dependent on availability of resources and existing treatment guidelines, considered by other Pain Management Practitioners as being common knowledge and practice, at the time of the intervention. For Medico-Legal purposes, it is also important to point out that variation in procedural techniques and pharmacological choices are the acceptable norm. The indications, contraindications, technique, and results of the above procedure should only be interpreted and judged by a Board-Certified Interventional Pain Specialist with extensive familiarity and expertise in the same exact procedure and technique.

## 2020-12-07 NOTE — Progress Notes (Signed)
Safety precautions to be maintained throughout the outpatient stay will include: orient to surroundings, keep bed in low position, maintain call bell within reach at all times, provide assistance with transfer out of bed and ambulation.  

## 2020-12-07 NOTE — Patient Instructions (Signed)
Pain Management Discharge Instructions  General Discharge Instructions :  If you need to reach your doctor call: Monday-Friday 8:00 am - 4:00 pm at [phone] or toll free [phone].  After clinic hours [phone] to have operator reach doctor.  Bring all of your medication bottles to all your appointments in the pain clinic.  To cancel or reschedule your appointment with Pain Management please remember to call 24 hours in advance to avoid a fee.  Refer to the educational materials which you have been given on: General Risks, I had my Procedure. Discharge Instructions, Post Sedation.  Post Procedure Instructions:  The drugs you were given will stay in your system until tomorrow, so for the next 24 hours you should not drive, make any legal decisions or drink any alcoholic beverages.  You may eat anything you prefer, but it is better to start with liquids then soups and crackers, and gradually work up to solid foods.  Please notify your doctor immediately if you have any unusual bleeding, trouble breathing or pain that is not related to your normal pain.  Depending on the type of procedure that was done, some parts of your body may feel week and/or numb.  This usually clears up by tonight or the next day.  Walk with the use of an assistive device or accompanied by an adult for the 24 hours.  You may use ice on the affected area for the first 24 hours.  Put ice in a Ziploc bag and cover with a towel and place against area 15 minutes on 15 minutes off.  You may switch to heat after 24 hours.Trigger Point Injection Trigger points are areas where you have pain. A trigger point injection is a shot given in the trigger point to help relieve pain for a few days to a few months. Common places for trigger points include: The neck. The shoulders. The upper back. The lower back. A trigger point injection will not cure long-term (chronic) pain permanently. These injections do not always  work for every person. Forsome people, they can help to relieve pain for a few days to a few months. Tell a health care provider about: Any allergies you have. All medicines you are taking, including vitamins, herbs, eye drops, creams, and over-the-counter medicines. Any problems you or family members have had with anesthetic medicines. Any blood disorders you have. Any surgeries you have had. Any medical conditions you have. What are the risks? Generally, this is a safe procedure. However, problems may occur, including: Infection. Bleeding or bruising. Allergic reaction to the injected medicine. Irritation of the skin around the injection site. What happens before the procedure? Ask your health care provider about: Changing or stopping your regular medicines. This is especially important if you are taking diabetes medicines or blood thinners. Taking medicines such as aspirin and ibuprofen. These medicines can thin your blood. Do not take these medicines unless your health care provider tells you to take them. Taking over-the-counter medicines, vitamins, herbs, and supplements. What happens during the procedure?  Your health care provider will feel for trigger points. A marker may be used to circle the area for the injection. The skin over the trigger point will be washed with a germ-killing (antiseptic) solution. A thin needle is used for the injection. You may feel pain or a twitching feeling when the needle enters the trigger point. A numbing solution may be injected into the trigger point. Sometimes a medicine to keep down inflammation is also injected. Your health   care provider may move the needle around the area where the trigger point is located until the tightness and twitching goes away. After the injection, your health care provider may put gentle pressure over the injection site. The injection site will be covered with a bandage (dressing). The procedure may vary among health  care providers and hospitals. What can I expect after treatment? After treatment, you may have: Soreness and stiffness for 1-2 days. A dressing. This can be taken off in a few hours or as told by your health care provider. Follow these instructions at home: Injection site care Remove your dressing as told by your health care provider. Check your injection site every day for signs of infection. Check for: Redness, swelling, or pain. Fluid or blood. Warmth. Pus or a bad smell. Managing pain, stiffness, and swelling If directed, put ice on the affected area. Put ice in a plastic bag. Place a towel between your skin and the bag. Leave the ice on for 20 minutes, 2-3 times a day. General instructions If you were asked to stop your regular medicines, ask your health care provider when you may start taking them again. Return to your normal activities as told by your health care provider. Ask your health care provider what activities are safe for you. Do not take baths, swim, or use a hot tub until your health care provider approves. You may be asked to see an occupational or physical therapist for exercises that reduce muscle strain and stretch the area of the trigger point. Keep all follow-up visits as told by your health care provider. This is important. Contact a health care provider if: Your pain comes back, and it is worse than before the injection. You may need more injections. You have chills or a fever. The injection site becomes more painful, red, swollen, or warm to the touch. Summary A trigger point injection is a shot given in the trigger point to help relieve pain for a few days to a few months. Common places for trigger point injections are the neck, shoulder, upper back, and lower back. These injections do not always work for every person, but for some people, the injections can help to relieve pain for a few days to a few months. Contact a health care provider if symptoms come  back or they are worse than before treatment. Also, get help if the injection site becomes more painful, red, swollen, or warm to the touch. This information is not intended to replace advice given to you by your health care provider. Make sure you discuss any questions you have with your healthcare provider. Document Revised: 05/21/2018 Document Reviewed: 05/21/2018 Elsevier Patient Education  2022 [employer]  

## 2020-12-08 ENCOUNTER — Telehealth: Payer: Self-pay | Admitting: *Deleted

## 2020-12-08 DIAGNOSIS — I10 Essential (primary) hypertension: Secondary | ICD-10-CM | POA: Insufficient documentation

## 2020-12-08 DIAGNOSIS — E538 Deficiency of other specified B group vitamins: Secondary | ICD-10-CM | POA: Insufficient documentation

## 2020-12-08 DIAGNOSIS — E559 Vitamin D deficiency, unspecified: Secondary | ICD-10-CM | POA: Insufficient documentation

## 2020-12-08 NOTE — Telephone Encounter (Signed)
Phone call re; procedure on yesterday, no questions or concerns.

## 2020-12-15 ENCOUNTER — Ambulatory Visit
Payer: Medicare HMO | Attending: Student in an Organized Health Care Education/Training Program | Admitting: Student in an Organized Health Care Education/Training Program

## 2020-12-15 ENCOUNTER — Other Ambulatory Visit: Payer: Self-pay

## 2020-12-15 ENCOUNTER — Encounter: Payer: Self-pay | Admitting: Student in an Organized Health Care Education/Training Program

## 2020-12-15 VITALS — BP 113/76 | HR 83 | Temp 96.7°F | Resp 16 | Ht <= 58 in | Wt 150.0 lb

## 2020-12-15 DIAGNOSIS — Q7649 Other congenital malformations of spine, not associated with scoliosis: Secondary | ICD-10-CM | POA: Insufficient documentation

## 2020-12-15 DIAGNOSIS — M7918 Myalgia, other site: Secondary | ICD-10-CM | POA: Insufficient documentation

## 2020-12-15 DIAGNOSIS — Z981 Arthrodesis status: Secondary | ICD-10-CM | POA: Diagnosis present

## 2020-12-15 DIAGNOSIS — M542 Cervicalgia: Secondary | ICD-10-CM | POA: Insufficient documentation

## 2020-12-15 DIAGNOSIS — Q766 Other congenital malformations of ribs: Secondary | ICD-10-CM | POA: Diagnosis present

## 2020-12-15 DIAGNOSIS — G894 Chronic pain syndrome: Secondary | ICD-10-CM | POA: Diagnosis present

## 2020-12-15 DIAGNOSIS — M4105 Infantile idiopathic scoliosis, thoracolumbar region: Secondary | ICD-10-CM | POA: Insufficient documentation

## 2020-12-15 DIAGNOSIS — M67912 Unspecified disorder of synovium and tendon, left shoulder: Secondary | ICD-10-CM | POA: Insufficient documentation

## 2020-12-15 MED ORDER — BUPRENORPHINE 5 MCG/HR TD PTWK: 1.0000 | MEDICATED_PATCH | TRANSDERMAL | 3 refills | Status: DC

## 2020-12-15 NOTE — Progress Notes (Signed)
Nursing Pain Medication Assessment:  Safety precautions to be maintained throughout the outpatient stay will include: orient to surroundings, keep bed in low position, maintain call bell within reach at all times, provide assistance with transfer out of bed and ambulation.  Medication Inspection Compliance: Pill count conducted under aseptic conditions, in front of the patient. Neither the pills nor the bottle was removed from the patient's sight at any time. Once count was completed pills were immediately returned to the patient in their original bottle.  Medication: Buprenorphine (Suboxone) Pill/Patch Count:  0 of 4 pills remain Pill/Patch Appearance: Markings consistent with prescribed medication Bottle Appearance: Standard pharmacy container. Clearly labeled. Filled Date: 8 / 4 / 2022 Last Medication intake:  Today

## 2020-12-15 NOTE — Progress Notes (Signed)
PROVIDER NOTE: Information contained herein reflects review and annotations entered in association with encounter. Interpretation of such information and data should be left to medically-trained personnel. Information provided to patient can be located elsewhere in the medical record under "Patient Instructions". Document created using STT-dictation technology, any transcriptional errors that may result from process are unintentional.    Patient: Brianna Arnold  Service Category: E/M  Provider: Gillis Santa, MD  DOB: 04-26-1990  DOS: 12/15/2020  Specialty: Interventional Pain Management  MRN: [id number]  Setting: Ambulatory outpatient  PCP: Idelle Crouch, MD  Type: Established Patient    Referring Provider: Idelle Crouch, MD  Location: Office  Delivery: Face-to-face     HPI  Brianna Arnold, a 30 y.o. 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 Brianna Arnold's primary complain today is Back Pain Last encounter: My last encounter with her was on 10/20/20 (MM) Pertinent problems: Brianna Arnold has Status post cervical spinal fusion; Migraine without aura and without status migrainosus, not intractable; History of lumbar spinal fusion (T11- iliac); Cervicalgia; Chronic left shoulder pain; Disorder of left rotator cuff; and Chronic pain syndrome on their pertinent problem list. Pain Assessment: Severity of Chronic pain is reported as a 5 /10. Location: Back Upper, Mid, Lower/both legs, worse down right leg. Onset: More than a month ago. Quality: Stabbing, Aching. Timing: Constant. Modifying factor(s): sleep, ice, heat, medications. Vitals:  height is '4\' 7"'  (1.397 m) and weight is 150 lb (68 kg). Her temporal temperature is 96.7 F (35.9 C) (abnormal). Her blood pressure is 113/76 and her pulse is 83. Her respiration is 16 and oxygen saturation is 100%.   Reason for encounter: both, medication management and post-procedure  assessment.    Brianna Arnold presents today for medication refill as well as postprocedural assessment from her previous TPI.  She receives TPI's every 3 to 4 weeks which are helpful for her myofascial pain syndrome, cervical, thoracic, lumbar. Refill Butrans patch as below.  No change in dose.   Post-Procedure Evaluation  Procedure (12/07/2020):   Type: Lumbar Paraspinal Muscle Trigger Point Injection (3+)         Quadratus lumborum, erector spinae, multifidus and left trapzeius and periscapular region and Right cervical/trapezius TPI CPT: 20553 with fluoroscopy Primary Purpose: Therapeutic Approach: Percutaneous, ipsilateral approach. Laterality: Midline    Anxiolysis: Please see nurses note.  Effectiveness during initial hour after procedure (Ultra-Short Term Relief): 50 %   Local anesthetic used: Long-acting (4-6 hours) Effectiveness: Defined as any analgesic benefit obtained secondary to the administration of local anesthetics. This carries significant diagnostic value as to the etiological location, or anatomical origin, of the pain. Duration of benefit is expected to coincide with the duration of the local anesthetic used.  Effectiveness during initial 4-6 hours after procedure (Short-Term Relief):  (does not remember)   Long-term benefit: Defined as any relief past the pharmacologic duration of the local anesthetics.  Effectiveness past the initial 6 hours after procedure (Long-Term Relief): 40 %   Benefits, current: Defined as benefit present at the time of this evaluation.   Analgesia:  40-50%    Pharmacotherapy Assessment  Analgesic: Butrans 5 mcg/hr q 7days   Monitoring: [place] PMP: PDMP reviewed during this encounter.       Pharmacotherapy: No side-effects or adverse reactions reported. Compliance: No problems identified. Effectiveness: Clinically acceptable.  UDS:  Summary  Date Value Ref Range Status  10/15/2019 Note  Final    Comment:     ====================================================================  Compliance Drug Analysis, Ur ==================================================================== Test                             Result       Flag       Units  Drug Absent but Declared for Prescription Verification   Buprenorphine                  Not Detected UNEXPECTED ng/mg creat    Transdermal buprenorphine, as indicated in the declared medication    list, is not always detected even when used as directed.    Tramadol                       Not Detected UNEXPECTED ng/mg creat   Gabapentin                     Not Detected UNEXPECTED   Cyclobenzaprine                Not Detected UNEXPECTED   Trazodone                      Not Detected UNEXPECTED   Promethazine                   Not Detected UNEXPECTED ==================================================================== Test                      Result    Flag   Units      Ref Range   Creatinine              91               mg/dL      >=20 ==================================================================== Declared Medications:  The flagging and interpretation on this report are based on the  following declared medications.  Unexpected results may arise from  inaccuracies in the declared medications.   **Note: The testing scope of this panel includes these medications:   Cyclobenzaprine (Flexeril)  Gabapentin (Neurontin)  Promethazine (Phenergan)  Tramadol (Ultram)  Trazodone (Desyrel)   **Note: The testing scope of this panel does not include small to  moderate amounts of these reported medications:   Buprenorphine Patch (BuTrans)   **Note: The testing scope of this panel does not include the  following reported medications:   Albuterol (Ventolin HFA)  Ethinyl Estradiol (Sprintec)  Famotidine (Pepcid)  Iron  Norgestimate (Sprintec)  Vitamin B12  Vitamin D3 ==================================================================== For clinical  consultation, please call [phone]. ====================================================================       ROS  Constitutional: Denies any fever or chills Gastrointestinal: No reported hemesis, hematochezia, vomiting, or acute GI distress Musculoskeletal:  Left shoulder pain, bilateral low back pain Neurological: No reported episodes of acute onset apraxia, aphasia, dysarthria, agnosia, amnesia, paralysis, loss of coordination, or loss of consciousness  Medication Review  Vitamin D3, albuterol, buprenorphine, cyclobenzaprine, ergocalciferol, famotidine, ferrous sulfate, gabapentin, medroxyPROGESTERone, ofloxacin, promethazine, propranolol ER, traMADol, traZODone, and vitamin B-12  History Review  Allergy: Brianna Arnold is allergic to hypafix [wound dressings], ativan [lorazepam], and tizanidine. Drug: Brianna Arnold  reports no history of drug use. Alcohol:  has no history on file for alcohol use. Tobacco:  reports that she has never smoked. She has never used smokeless tobacco. Social: Brianna Arnold  reports that she has never smoked. She has never used smokeless tobacco. She reports that she does not use drugs. Medical:  has a past medical history of Anemia and Jarcho-Levin syndrome. Surgical: Brianna Arnold  has a past surgical history that includes Cervical spine surgery (07/2014); Back surgery (10/2014); and Anterior cervical decomp/discectomy fusion (N/A, 02/11/2019). Family: family history includes Hypertension in her father.  Laboratory Chemistry Profile   Renal Lab Results  Component Value Date   BUN 6 02/15/2019   CREATININE 0.63 02/15/2019   GFRAA >60 02/15/2019   GFRNONAA >60 02/15/2019     Hepatic Lab Results  Component Value Date   AST 17 02/14/2019   ALT 12 02/14/2019   ALBUMIN 3.9 02/14/2019   ALKPHOS 55 02/14/2019   LIPASE 18 02/14/2019     Electrolytes Lab Results  Component Value Date   NA 137 02/15/2019   K 4.1 02/15/2019   CL 106 02/15/2019    CALCIUM 8.1 (L) 02/15/2019     Bone No results found for: VD25OH, [id number], PI9518AC1, YS0630ZS0, 25OHVITD1, 25OHVITD2, 25OHVITD3, TESTOFREE, TESTOSTERONE   Inflammation (CRP: Acute Phase) (ESR: Chronic Phase) Lab Results  Component Value Date   LATICACIDVEN 1.6 04/19/2015       Note: Above Lab results reviewed.  Physical Exam  General appearance: Well nourished, well developed, and well hydrated. In no apparent acute distress Mental status: Alert, oriented x 3 (person, place, & time)       Respiratory: No evidence of acute respiratory distress Eyes: [name] Vitals: BP 113/76   Pulse 83   Temp (!) 96.7 F (35.9 C) (Temporal)   Resp 16   Ht '4\' 7"'  (1.397 m)   Wt 150 lb (68 kg)   SpO2 100%   BMI 34.86 kg/m  BMI: Estimated body mass index is 34.86 kg/m as calculated from the following:   Height as of this encounter: '4\' 7"'  (1.397 m).   Weight as of this encounter: 150 lb (68 kg). Ideal: Patient must be at least 60 in tall to calculate ideal body weight  Cervical Spine Exam  Skin & Axial Inspection: Well healed scar from previous spine surgery detected Alignment: Asymmetric Functional ROM: Pain restricted ROM      Stability: No instability detected Muscle Tone/Strength: Functionally intact. No obvious neuro-muscular anomalies detected. Sensory (Neurological): Neuropathic pain pattern Palpation: No palpable anomalies                          Upper Extremity (UE) Exam      Side: Right upper extremity   Side: Left upper extremity    Skin & Extremity Inspection: Skin color, temperature, and hair growth are WNL. No peripheral edema or cyanosis. No masses, redness, swelling, asymmetry, or associated skin lesions. No contractures.   Skin & Extremity Inspection:  Axillary nerve peripheral nerve stimulator in place, insertion site not erythematous, nonindurated.    Functional ROM: Unrestricted ROM           Functional ROM: Decreased ROM for all joints of upper extremity however  improved since axillary nerve peripheral nerve stimulation    Muscle Tone/Strength: Functionally intact. No obvious neuro-muscular anomalies detected.   Muscle Tone/Strength: Functionally intact. No obvious neuro-muscular anomalies detected.    Sensory (Neurological): Dermatomal pain pattern           Sensory (Neurological): Dermatomal pain pattern            Palpation: No palpable anomalies               Palpation: No palpable anomalies  Provocative Test(s):  Phalen's test: deferred Tinel's test: deferred Apley's scratch test (touch opposite shoulder):  Action 1 (Across chest): deferred Action 2 (Overhead): deferred Action 3 (LB reach): deferred     Provocative Test(s):  Phalen's test: deferred Tinel's test: deferred Apley's scratch test (touch opposite shoulder):  Action 1 (Across chest): Decreased ROM Action 2 (Overhead): Decreased ROM Action 3 (LB reach): Decreased ROM        Thoracic Spine Area Exam  Skin & Axial Inspection: Well healed scar from previous spine surgery detected Alignment: Symmetrical Functional ROM: Mechanically restricted ROM Stability: No instability detected Muscle Tone/Strength: Functionally intact. No obvious neuro-muscular anomalies detected. Sensory (Neurological): Neurogenic pain pattern Muscle strength & Tone: No palpable anomalies   Lumbar Exam  Skin & Axial Inspection: Well healed scar from previous spine surgery detected Alignment: Scoliosis detected Functional ROM: Pain restricted ROM       Stability: No instability detected Muscle Tone/Strength: Functionally intact. No obvious neuro-muscular anomalies detected. Sensory (Neurological):  Musculoskeletal pain pattern Palpation: Complains of area being tender to palpation         Gait & Posture Assessment  Ambulation: Limited Gait: Antalgic Posture: Difficulty standing up straight, due to pain    Lower Extremity Exam      Side: Right lower extremity   Side: Left lower extremity   Stability: No instability observed           Stability: No instability observed          Skin & Extremity Inspection: Skin color, temperature, and hair growth are WNL. No peripheral edema or cyanosis. No masses, redness, swelling, asymmetry, or associated skin lesions. No contractures.   Skin & Extremity Inspection: Skin color, temperature, and hair growth are WNL. No peripheral edema or cyanosis. No masses, redness, swelling, asymmetry, or associated skin lesions. No contractures.  Functional ROM: Pain restricted ROM                   Functional ROM: Pain restricted ROM                  Muscle Tone/Strength: Functionally intact. No obvious neuro-muscular anomalies detected.   Muscle Tone/Strength: Functionally intact. No obvious neuro-muscular anomalies detected.  Sensory (Neurological): Unimpaired         Sensory (Neurological): Unimpaired        DTR: Patellar: deferred today Achilles: deferred today Plantar: deferred today   DTR: Patellar: deferred today Achilles: deferred today Plantar: deferred today  Palpation: No palpable anomalies   Palpation: No palpable anomalies     Assessment   Status Diagnosis  Persistent Persistent Persistent 1. Myofascial pain syndrome of lumbar spine   2. Infantile idiopathic scoliosis of thoracolumbar region   3. Status post cervical spinal fusion   4. Disorder of left rotator cuff   5. Cervicalgia   6. Autosomal recessive spondylocostal dysostosis   7. Chronic pain syndrome        Plan of Care  Brianna Arnold has a current medication list which includes the following long-term medication(s): albuterol, famotidine, ferrous sulfate, gabapentin, medroxyprogesterone, trazodone, promethazine, and propranolol er.  Pharmacotherapy (Medications Ordered): Meds ordered this encounter  Medications   buprenorphine (BUTRANS) 5 MCG/HR PTWK    Sig: Place 1 patch onto the skin once a week.    Dispense:  4 patch    Refill:  3    For  chronic pain syndrome   2 weeks for TPI    Follow-up plan:   Return in  about 4 months (around 04/16/2021) for Medication Management, in person.      Pharmacological management options:  Opioid Analgesics: Butrans patch, side effects at 7.5 mcg an hour, analgesic benefit with no side effects at 5 mcg an hour, continue  Membrane stabilizer: Continue gabapentin as prescribed  Muscle relaxant: Continue Flexeril as prescribed  NSAID: To be determined at a later time  Other analgesic(s): To be determined at a later time             Recent Visits Date Type Provider Dept  12/07/20 Procedure visit Gillis Santa, MD [facility]  11/16/20 Procedure visit Gillis Santa, MD [facility]  10/26/20 Procedure visit Gillis Santa, MD [facility]  10/20/20 Office Visit Gillis Santa, MD [facility]  Showing recent visits within past 90 days and meeting all other requirements Today's Visits Date Type Provider Dept  12/15/20 Office Visit Gillis Santa, MD [facility]  Showing today's visits and meeting all other requirements Future Appointments Date Type Provider Dept  12/28/20 Appointment Gillis Santa, MD [facility]  Showing future appointments within next 90 days and meeting all other requirements I discussed the assessment and treatment plan with the patient. The patient was provided an opportunity to ask questions and all were answered. The patient agreed with the plan and demonstrated an understanding of the instructions.  Patient advised to call back or seek an in-person evaluation if the symptoms or condition worsens.  Duration of encounter: 22mnutes.  Note by: BGillis Santa MD Date: 12/15/2020; Time: 11:28 AM

## 2020-12-28 ENCOUNTER — Ambulatory Visit
Admission: RE | Admit: 2020-12-28 | Discharge: 2020-12-28 | Disposition: A | Discharge status: Home / Self Care | Payer: Medicare HMO | Source: Ambulatory Visit | Attending: Student in an Organized Health Care Education/Training Program | Admitting: Student in an Organized Health Care Education/Training Program

## 2020-12-28 ENCOUNTER — Ambulatory Visit (HOSPITAL_BASED_OUTPATIENT_CLINIC_OR_DEPARTMENT_OTHER): Payer: Medicare HMO | Admitting: Student in an Organized Health Care Education/Training Program

## 2020-12-28 ENCOUNTER — Other Ambulatory Visit: Payer: Self-pay

## 2020-12-28 VITALS — BP 136/90 | HR 90 | Temp 97.8°F | Resp 16 | Ht <= 58 in | Wt 149.0 lb

## 2020-12-28 DIAGNOSIS — G894 Chronic pain syndrome: Secondary | ICD-10-CM | POA: Insufficient documentation

## 2020-12-28 DIAGNOSIS — M7918 Myalgia, other site: Secondary | ICD-10-CM | POA: Diagnosis present

## 2020-12-28 DIAGNOSIS — M4105 Infantile idiopathic scoliosis, thoracolumbar region: Secondary | ICD-10-CM | POA: Insufficient documentation

## 2020-12-28 MED ORDER — ROPIVACAINE HCL 2 MG/ML IJ SOLN
9.0000 mL | Freq: Once | INTRAMUSCULAR | Status: AC
  Administered 2020-12-28: 9 mL via PERINEURAL

## 2020-12-28 MED ORDER — ROPIVACAINE HCL 2 MG/ML IJ SOLN
INTRAMUSCULAR | Status: AC
  Filled 2020-12-28: qty 20

## 2020-12-28 MED ORDER — DIAZEPAM 5 MG PO TABS
ORAL_TABLET | ORAL | Status: AC
  Filled 2020-12-28: qty 1

## 2020-12-28 MED ORDER — DIAZEPAM 5 MG PO TABS
5.0000 mg | ORAL_TABLET | ORAL | Status: AC
  Administered 2020-12-28: 5 mg via ORAL

## 2020-12-28 NOTE — Patient Instructions (Signed)

## 2020-12-28 NOTE — Progress Notes (Signed)
PROVIDER NOTE: Information contained herein reflects review and annotations entered in association with encounter. Interpretation of such information and data should be left to medically-trained personnel. Information provided to patient can be located elsewhere in the medical record under "Patient Instructions". Document created using STT-dictation technology, any transcriptional errors that may result from process are unintentional.    Patient: Brianna Arnold  Service Category: Procedure  Provider: Edward JollyBilal Tannia Contino, MD  DOB: 09-13-1990  DOS: 12/28/2020  Location: [facility]  MRN: [id number]  Setting: Ambulatory - outpatient  Referring Provider: Marguarite ArbourSparks, Jeffrey D, MD  Type: Established Patient  Specialty: Interventional Pain Management  PCP: Marguarite ArbourSparks, Jeffrey D, MD   Primary Reason for Visit: Interventional Pain Management Treatment. CC: Back Pain (Upper,lower)  Procedure:          Anesthesia, Analgesia, Anxiolysis:  Type: Lumbar Paraspinal Muscle Trigger Point Injection (3+)         Quadratus lumborum, erector spinae, multifidus and left trapzeius and periscapular region and Right cervical/trapezius TPI CPT: 20553 with fluoroscopy Primary Purpose: Therapeutic Approach: Percutaneous, ipsilateral approach. Laterality: Midline        Type: Local Anesthesia with PO Valium for anxiety   Position: Prone   Indications: 1. Myofascial pain syndrome of lumbar spine   2. Infantile idiopathic scoliosis of thoracolumbar region   3. Chronic pain syndrome     Pain Score: Pre-procedure: 7 /10 Post-procedure: 3 /10   Pre-op H&P Assessment:  [name] is a 30 y.o. (year old), female patient, seen today for interventional treatment. She  has a past surgical history that includes Cervical spine surgery (07/2014); Back surgery (10/2014); and Anterior cervical decomp/discectomy fusion (N/A, 02/11/2019). [name] has a current medication list which includes the following  prescription(s): albuterol, buprenorphine, vitamin d3, cyclobenzaprine, ergocalciferol, famotidine, ferrous sulfate, gabapentin, medroxyprogesterone, ofloxacin, promethazine, propranolol er, tramadol, trazodone, and vitamin b-12. Her primarily concern today is the Back Pain (Upper,lower)  Initial Vital Signs:  Pulse/HCG Rate: 89  Temp: 97.8 F (36.6 C) Resp: 16 BP: 114/79 SpO2: 100 %  BMI: Estimated body mass index is 34.63 kg/m as calculated from the following:   Height as of this encounter: 4\' 7"  (1.397 m).   Weight as of this encounter: 149 lb (67.6 kg).  Risk Assessment: Allergies: Reviewed. She is allergic to hypafix [wound dressings], ativan [lorazepam], and tizanidine.  Allergy Precautions: None required Coagulopathies: Reviewed. None identified.  Blood-thinner therapy: None at this time Active Infection(s): Reviewed. None identified. [name] is afebrile  Site Confirmation: [name] was asked to confirm the procedure and laterality before marking the site Procedure checklist: Completed Consent: Before the procedure and under the influence of no sedative(s), amnesic(s), or anxiolytics, the patient was informed of the treatment options, risks and possible complications. To fulfill our ethical and legal obligations, as recommended by the American Medical Association's Code of Ethics, I have informed the patient of my clinical impression; the nature and purpose of the treatment or procedure; the risks, benefits, and possible complications of the intervention; the alternatives, including doing nothing; the risk(s) and benefit(s) of the alternative treatment(s) or procedure(s); and the risk(s) and benefit(s) of doing nothing. The patient was provided information about the general risks and possible complications associated with the procedure. These may include, but are not limited to: failure to achieve desired goals, infection, bleeding, organ or nerve damage, allergic reactions,  paralysis, and death. In addition, the patient was informed of those risks and complications associated to the procedure, such as failure to  decrease pain; infection; bleeding; organ or nerve damage with subsequent damage to sensory, motor, and/or autonomic systems, resulting in permanent pain, numbness, and/or weakness of one or several areas of the body; allergic reactions; (i.e.: anaphylactic reaction); and/or death. Furthermore, the patient was informed of those risks and complications associated with the medications. These include, but are not limited to: allergic reactions (i.e.: anaphylactic or anaphylactoid reaction(s)); adrenal axis suppression; blood sugar elevation that in diabetics may result in ketoacidosis or comma; water retention that in patients with history of congestive heart failure may result in shortness of breath, pulmonary edema, and decompensation with resultant heart failure; weight gain; swelling or edema; medication-induced neural toxicity; particulate matter embolism and blood vessel occlusion with resultant organ, and/or nervous system infarction; and/or aseptic necrosis of one or more joints. Finally, the patient was informed that Medicine is not an exact science; therefore, there is also the possibility of unforeseen or unpredictable risks and/or possible complications that may result in a catastrophic outcome. The patient indicated having understood very clearly. We have given the patient no guarantees and we have made no promises. Enough time was given to the patient to ask questions, all of which were answered to the patient's satisfaction. Ms. Resendes has indicated that she wanted to continue with the procedure. Attestation: I, the ordering provider, attest that I have discussed with the patient the benefits, risks, side-effects, alternatives, likelihood of achieving goals, and potential problems during recovery for the procedure that I have provided informed consent. Date   Time: 12/28/2020  2:40 PM  Pre-Procedure Preparation:  Monitoring: As per clinic protocol. Respiration, ETCO2, SpO2, BP, heart rate and rhythm monitor placed and checked for adequate function Safety Precautions: Patient was assessed for positional comfort and pressure points before starting the procedure. Time-out: I initiated and conducted the "Time-out" before starting the procedure, as per protocol. The patient was asked to participate by confirming the accuracy of the "Time Out" information. Verification of the correct person, site, and procedure were performed and confirmed by me, the nursing staff, and the patient. "Time-out" conducted as per Joint Commission's Universal Protocol (UP.01.01.01). Time: 1500  Description of Procedure:          Area Prepped: Entire Lower Lumbosacral Region & Right cervical region DuraPrep (Iodine Povacrylex [0.7% available iodine] and Isopropyl Alcohol, 74% w/w) Safety Precautions: Aspiration looking for blood return was conducted prior to all injections. At no point did we inject any substances, as a needle was being advanced. No attempts were made at seeking any paresthesias. Safe injection practices and needle disposal techniques used. Medications properly checked for expiration dates. SDV (single dose vial) medications used. Description of the Procedure: Protocol guidelines were followed. The patient was placed in position over the fluoroscopy table. The target area was identified and the area prepped in the usual manner. Skin & deeper tissues infiltrated with local anesthetic. Appropriate amount of time allowed to pass for local anesthetics to take effect. The procedure needles were then advanced to the target area. Proper needle placement secured. Negative aspiration confirmed. Solution injected in intermittent fashion, asking for systemic symptoms every 0.5cc of injectate. The needles were then removed and the area cleansed, making sure to leave some of the  prepping solution back to take advantage of its long term bactericidal properties.  Vitals:   12/28/20 1443 12/28/20 1500 12/28/20 1506  BP: 114/79 (!) 132/98 136/90  Pulse: 89 89 90  Resp: 16 18 16   Temp: 97.8 F (36.6 C)    TempSrc:  Temporal    SpO2: 100% 96% 97%  Weight: 149 lb (67.6 kg)    Height: 4\' 7"  (1.397 m)      Start Time: 1500 hrs. End Time:   hrs. Materials:  Needle(s) Type: Regular needle Gauge: 27G Length: 1.5-in  Medication(s): Please see orders for medications and dosing details.  Approximately 10 trigger point injected in the left lower lumbar region with 0.5 to 1 cc of 0.2% ropivacaine with needling performed.  Approximately 6 trigger points were also injected in the right cervical, right trapezius region with dry needling performed.  Each trigger point was injected with 0.5 to 1 cc of 0.2% ropivacaine.  Imaging Guidance:          Type of Imaging Technique: Fluoroscopy Guidance (Spinal) Indication(s): Assistance in needle guidance and placement for procedures requiring needle placement in or near specific anatomical locations not easily accessible without such assistance. Exposure Time: No patient exposure Contrast: None used. Fluoroscopic Guidance: I was personally present during the use of fluoroscopy. "Tunnel Vision Technique" used to obtain the best possible view of the target area. Parallax error corrected before commencing the procedure. "Direction-depth-direction" technique used to introduce the needle under continuous pulsed fluoroscopy. Once target was reached, antero-posterior, oblique, and lateral fluoroscopic projection used confirm needle placement in all planes. Images permanently stored in EMR. Ultrasound Guidance: N/A   Post-operative Assessment:  Post-procedure Vital Signs:  Pulse/HCG Rate: 90 (nsr)  Temp: 97.8 F (36.6 C) Resp: 16 BP: 136/90 SpO2: 97 %  EBL: None  Complications: No immediate post-treatment complications observed by  team, or reported by patient.  Note: The patient tolerated the entire procedure well. A repeat set of vitals were taken after the procedure and the patient was kept under observation following institutional policy, for this type of procedure. Post-procedural neurological assessment was performed, showing return to baseline, prior to discharge. The patient was provided with post-procedure discharge instructions, including a section on how to identify potential problems. Should any problems arise concerning this procedure, the patient was given instructions to immediately contact , at any time, without hesitation. In any case, we plan to contact the patient by telephone for a follow-up status report regarding this interventional procedure.  Comments:  No additional relevant information.  Plan of Care  Orders:  Orders Placed This Encounter  Procedures   TRIGGER POINT INJECTION    Standing Status:   Standing    Number of Occurrences:   10    Standing Expiration Date:   06/27/2021    Scheduling Instructions:     Cervical, thoracic, lumbar spine TPI    Order Specific Question:   Where will this procedure be performed?    Answer:   [facility]   DG PAIN [facility] C-ARM 1-60 MIN NO REPORT    Intraoperative interpretation by procedural physician at [facility].    Standing Status:   Standing    Number of Occurrences:   1    Order Specific Question:   Reason for exam:    Answer:   Assistance in needle guidance and placement for procedures requiring needle placement in or near specific anatomical locations not easily accessible without such assistance.     Medications ordered for procedure: Meds ordered this encounter  Medications   diazepam (VALIUM) tablet 5 mg    Make sure Flumazenil is available in the pyxis when using this medication. If oversedation occurs, administer 0.2 mg IV over 15 sec. If after 45 sec no response, administer 0.2 mg  again over 1 min; may repeat at 1 min  intervals; not to exceed 4 doses (1 mg)   ropivacaine (PF) 2 mg/mL (0.2%) (NAROPIN) injection 9 mL   ropivacaine (PF) 2 mg/mL (0.2%) (NAROPIN) injection 9 mL    Medications administered: We administered diazepam, ropivacaine (PF) 2 mg/mL (0.2%), and ropivacaine (PF) 2 mg/mL (0.2%).  See the medical record for exact dosing, route, and time of administration.  Follow-up plan:   Return in about 3 weeks (around 01/18/2021) for TPI.       Pharmacological management options:  Opioid Analgesics: Butrans patch, side effects at 7.5 mcg an hour, analgesic benefit with no side effects at 5 mcg an hour, continue  Membrane stabilizer: Continue gabapentin as prescribed  Muscle relaxant: Continue Flexeril as prescribed  NSAID: To be determined at a later time  Other analgesic(s): To be determined at a later time    Interventional management options: Ms. Meharg was informed that there is no guarantee that she would be a candidate for interventional therapies. The decision will be based on the results of diagnostic studies, as well as Ms. Davilla's risk profile.  Procedure(s) hx Left axillary peripheral nerve stimulation: 12/30/19 providing significant pain relief.  Removed 02/29/2020              Recent Visits Date Type Provider Dept  12/15/20 Office Visit Edward Jolly, MD [facility]  12/07/20 Procedure visit Edward Jolly, MD [facility]  11/16/20 Procedure visit Edward Jolly, MD [facility]  10/26/20 Procedure visit Edward Jolly, MD [facility]  10/20/20 Office Visit Edward Jolly, MD [facility]  Showing recent visits within past 90 days and meeting all other requirements Today's Visits Date Type Provider Dept  12/28/20 Procedure visit Edward Jolly, MD [facility]  Showing today's visits and meeting all other requirements Future Appointments Date Type Provider Dept  01/25/21 Appointment Edward Jolly, MD [facility]   Showing future appointments within next 90 days and meeting all other requirements Disposition: Discharge home  Discharge (Date  Time): 12/28/2020; 1510 hrs.   Primary Care Physician: Marguarite Arbour, MD Location: [facility] Note by: Edward Jolly, MD Date: 12/28/2020; Time: 3:17 PM  Disclaimer:  Medicine is not an Visual merchandiser. The only guarantee in medicine is that nothing is guaranteed. It is important to note that the decision to proceed with this intervention was based on the information collected from the patient. The Data and conclusions were drawn from the patient's questionnaire, the interview, and the physical examination. Because the information was provided in large part by the patient, it cannot be guaranteed that it has not been purposely or unconsciously manipulated. Every effort has been made to obtain as much relevant data as possible for this evaluation. It is important to note that the conclusions that lead to this procedure are derived in large part from the available data. Always take into account that the treatment will also be dependent on availability of resources and existing treatment guidelines, considered by other Pain Management Practitioners as being common knowledge and practice, at the time of the intervention. For Medico-Legal purposes, it is also important to point out that variation in procedural techniques and pharmacological choices are the acceptable norm. The indications, contraindications, technique, and results of the above procedure should only be interpreted and judged by a Board-Certified Interventional Pain Specialist with extensive familiarity and expertise in the same exact procedure and technique.

## 2020-12-28 NOTE — Progress Notes (Signed)
Safety precautions to be maintained throughout the outpatient stay will include: orient to surroundings, keep bed in low position, maintain call bell within reach at all times, provide assistance with transfer out of bed and ambulation.  

## 2020-12-29 ENCOUNTER — Telehealth: Payer: Self-pay

## 2020-12-29 NOTE — Telephone Encounter (Signed)
Post procedure phone call.  LM 

## 2021-01-25 ENCOUNTER — Encounter: Payer: Self-pay | Admitting: Student in an Organized Health Care Education/Training Program

## 2021-01-25 ENCOUNTER — Ambulatory Visit
Admission: RE | Admit: 2021-01-25 | Discharge: 2021-01-25 | Disposition: A | Discharge status: Home / Self Care | Payer: Medicare HMO | Source: Ambulatory Visit | Attending: Student in an Organized Health Care Education/Training Program | Admitting: Student in an Organized Health Care Education/Training Program

## 2021-01-25 ENCOUNTER — Other Ambulatory Visit: Payer: Self-pay

## 2021-01-25 ENCOUNTER — Ambulatory Visit (HOSPITAL_BASED_OUTPATIENT_CLINIC_OR_DEPARTMENT_OTHER): Payer: Medicare HMO | Admitting: Student in an Organized Health Care Education/Training Program

## 2021-01-25 VITALS — BP 139/93 | HR 102 | Temp 97.2°F | Resp 18 | Ht <= 58 in | Wt 147.0 lb

## 2021-01-25 DIAGNOSIS — M7918 Myalgia, other site: Secondary | ICD-10-CM | POA: Diagnosis present

## 2021-01-25 DIAGNOSIS — M4105 Infantile idiopathic scoliosis, thoracolumbar region: Secondary | ICD-10-CM | POA: Diagnosis present

## 2021-01-25 DIAGNOSIS — G894 Chronic pain syndrome: Secondary | ICD-10-CM | POA: Diagnosis present

## 2021-01-25 MED ORDER — DIAZEPAM 5 MG PO TABS
5.0000 mg | ORAL_TABLET | ORAL | Status: AC
  Administered 2021-01-25: 5 mg via ORAL

## 2021-01-25 MED ORDER — ROPIVACAINE HCL 2 MG/ML IJ SOLN
9.0000 mL | Freq: Once | INTRAMUSCULAR | Status: AC
  Administered 2021-01-25: 20 mL via PERINEURAL

## 2021-01-25 MED ORDER — ROPIVACAINE HCL 2 MG/ML IJ SOLN
INTRAMUSCULAR | Status: AC
  Filled 2021-01-25: qty 40

## 2021-01-25 MED ORDER — ROPIVACAINE HCL 2 MG/ML IJ SOLN
9.0000 mL | Freq: Once | INTRAMUSCULAR | Status: AC
  Administered 2021-01-25: 9 mL via PERINEURAL

## 2021-01-25 MED ORDER — DIAZEPAM 5 MG PO TABS
ORAL_TABLET | ORAL | Status: AC
  Filled 2021-01-25: qty 1

## 2021-01-25 NOTE — Patient Instructions (Signed)
Pain Management Discharge Instructions ? ?General Discharge Instructions : ? ?If you need to reach your doctor call: Monday-Friday 8:00 am - 4:00 pm at [phone] or toll free [phone].  After clinic hours [phone] to have operator reach doctor. ? ?Bring all of your medication bottles to all your appointments in the pain clinic. ? ?To cancel or reschedule your appointment with Pain Management please remember to call 24 hours in advance to avoid a fee. ? ?Refer to the educational materials which you have been given on: General Risks, I had my Procedure. Discharge Instructions, Post Sedation. ? ?Post Procedure Instructions: ? ?The drugs you were given will stay in your system until tomorrow, so for the next 24 hours you should not drive, make any legal decisions or drink any alcoholic beverages. ? ?You may eat anything you prefer, but it is better to start with liquids then soups and crackers, and gradually work up to solid foods. ? ?Please notify your doctor immediately if you have any unusual bleeding, trouble breathing or pain that is not related to your normal pain. ? ?Depending on the type of procedure that was done, some parts of your body may feel week and/or numb.  This usually clears up by tonight or the next day. ? ?Walk with the use of an assistive device or accompanied by an adult for the 24 hours. ? ?You may use ice on the affected area for the first 24 hours.  Put ice in a Ziploc bag and cover with a towel and place against area 15 minutes on 15 minutes off.  You may switch to heat after 24 hours.Trigger Point Injection ?Trigger points are areas where you have pain. A trigger point injection is a shot given in the trigger point to help relieve pain for a few days to a few months. Common places for trigger points include the neck, shoulders, upper back, or lower back. ?A trigger point injection will not cure long-term (chronic) pain permanently. These injections do not always work for every  person. For some people, they can help to relieve pain for a few days to a few months. ?Tell a health care provider about: ?Any allergies you have. ?All medicines you are taking, including vitamins, herbs, eye drops, creams, and over-the-counter medicines. ?Any problems you or family members have had with anesthetic medicines. ?Any bleeding problems you have. ?Any surgeries you have had. ?Any medical conditions you have. ?Whether you are pregnant or may be pregnant. ?What are the risks? ?Generally, this is a safe procedure. However, problems may occur, including: ?Infection. ?Bleeding or bruising. ?Allergic reaction to the injected medicine. ?Irritation of the skin around the injection site. ?What happens before the procedure? ?Ask your health care provider about: ?Changing or stopping your regular medicines. This is especially important if you are taking diabetes medicines or blood thinners. ?Taking medicines such as aspirin and ibuprofen. These medicines can thin your blood. Do not take these medicines unless your health care provider tells you to take them. ?Taking over-the-counter medicines, vitamins, herbs, and supplements. ?What happens during the procedure? ? ?Your health care provider will feel for trigger points. A marker may be used to circle the area for the injection. ?The skin over the trigger point will be washed with a germ-killing soap. ?You may be given a medicine to help you relax (sedative). ?A thin needle is used for the injection. You may feel pain or a twitching feeling when the needle enters your skin. ?A numbing solution may be injected   into the trigger point. Sometimes a medicine to keep down inflammation is also injected. ?Your health care provider may move the needle around the area where the trigger point is located until the tightness and twitching goes away. ?After the injection, your health care provider may put gentle pressure over the injection site. ?The injection site will be  covered with a bandage (dressing). ?The procedure may vary among health care providers and hospitals. ?What can I expect after treatment? ?After treatment, you may have soreness and stiffness for 1-2 days. ?Follow these instructions at home: ?Injection site care ?Remove your dressing in a few hours, or as told by your health care provider. ?Check your injection site every day for signs of infection. Check for: ?Redness, swelling, or pain. ?Fluid or blood. ?Warmth. ?Pus or a bad smell. ?Managing pain, stiffness, and swelling ?If directed, put ice on the affected area. To do this: ?Put ice in a plastic bag. ?Place a towel between your skin and the bag. ?Leave the ice on for 20 minutes, 2-3 times a day. ?Remove the ice if your skin turns bright red. This is very important. If you cannot feel pain, heat, or cold, you have a greater risk of damage to the area. ?Activity ?If you were given a sedative during the procedure, it can affect you for several hours. Do not drive or operate machinery until your health care provider says that it is safe. ?Do not take baths, swim, or use a hot tub until your health care provider approves. ?Return to your normal activities as told by your health care provider. Ask your health care provider what activities are safe for you. ?General instructions ?If you were asked to stop your regular medicines, ask your health care provider when you may start taking them again. ?You may be asked to see an occupational or physical therapist for exercises that reduce muscle strain and stretch the area of the trigger point. ?Keep all follow-up visits. This is important. ?Contact a health care provider if: ?Your pain comes back, and it is worse than before the injection. You may need more injections. ?You have chills or a fever. ?The injection site becomes more painful, red, swollen, or warm to the touch. ?Summary ?A trigger point injection is a shot given in the trigger point to help relieve  pain. ?Common places for trigger point injections are the neck, shoulders, upper back, and lower back. ?These injections do not always work for every person, but for some people, the injections can help to relieve pain for a few days to a few months. ?Contact a health care provider if symptoms come back or if they are worse than before treatment. Also, get help if the injection site becomes more painful, red, swollen, or warm to the touch. ?This information is not intended to replace advice given to you by your health care provider. Make sure you discuss any questions you have with your health care provider. ?Document Revised: 07/19/2020 Document Reviewed: 07/19/2020 ?Elsevier Patient Education ? 2022 [employer] ? ?

## 2021-01-25 NOTE — Progress Notes (Signed)
PROVIDER NOTE: Information contained herein reflects review and annotations entered in association with encounter. Interpretation of such information and data should be left to medically-trained personnel. Information provided to patient can be located elsewhere in the medical record under "Patient Instructions". Document created using STT-dictation technology, any transcriptional errors that may result from process are unintentional.    Patient: Brianna Arnold  Service Category: Procedure  Provider: Edward Jolly, MD  DOB: 07-02-1990  DOS: 01/25/2021  Location: [facility]  MRN: [id number]  Setting: Ambulatory - outpatient  Referring Provider: Marguarite Arbour, MD  Type: Established Patient  Specialty: Interventional Pain Management  PCP: Marguarite Arbour, MD   Primary Reason for Visit: Interventional Pain Management Treatment. CC: Back Pain  Procedure:          Anesthesia, Analgesia, Anxiolysis:  Type: Lumbar Paraspinal Muscle Trigger Point Injection (3+)         Quadratus lumborum, erector spinae, multifidus and left trapzeius and periscapular region and Right cervical/trapezius TPI CPT: 20553 with fluoroscopy Primary Purpose: Therapeutic Approach: Percutaneous, ipsilateral approach. Laterality: Midline        Type: Local Anesthesia with PO Valium for anxiety   Position: Prone   Indications: 1. Myofascial pain syndrome of lumbar spine   2. Infantile idiopathic scoliosis of thoracolumbar region   3. Chronic pain syndrome      Pain Score: Pre-procedure: 8 /10 Post-procedure: 5 /10   Pre-op H&P Assessment:  Brianna Arnold is a 30 y.o. (year old), female patient, seen today for interventional treatment. She  has a past surgical history that includes Cervical spine surgery (07/2014); Back surgery (10/2014); and Anterior cervical decomp/discectomy fusion (N/A, 02/11/2019). Brianna Arnold has a current medication list which includes the following prescription(s):  albuterol, buprenorphine, vitamin d3, ergocalciferol, famotidine, ferrous sulfate, gabapentin, medroxyprogesterone, ofloxacin, promethazine, propranolol er, tramadol, trazodone, vitamin b-12, and cyclobenzaprine. Her primarily concern today is the Back Pain  Initial Vital Signs:  Pulse/HCG Rate: (!) 102ECG Heart Rate: 95 Temp: (!) 97.2 F (36.2 C) Resp: 15 BP: 124/86 SpO2: 100 %  BMI: Estimated body mass index is 33.55 kg/m as calculated from the following:   Height as of this encounter: 4' 7.5" (1.41 m).   Weight as of this encounter: 147 lb (66.7 kg).  Risk Assessment: Allergies: Reviewed. She is allergic to hypafix [wound dressings], ativan [lorazepam], and tizanidine.  Allergy Precautions: None required Coagulopathies: Reviewed. None identified.  Blood-thinner therapy: None at this time Active Infection(s): Reviewed. None identified. Brianna Arnold is afebrile  Site Confirmation: Brianna Arnold was asked to confirm the procedure and laterality before marking the site Procedure checklist: Completed Consent: Before the procedure and under the influence of no sedative(s), amnesic(s), or anxiolytics, the patient was informed of the treatment options, risks and possible complications. To fulfill our ethical and legal obligations, as recommended by the American Medical Association's Code of Ethics, I have informed the patient of my clinical impression; the nature and purpose of the treatment or procedure; the risks, benefits, and possible complications of the intervention; the alternatives, including doing nothing; the risk(s) and benefit(s) of the alternative treatment(s) or procedure(s); and the risk(s) and benefit(s) of doing nothing. The patient was provided information about the general risks and possible complications associated with the procedure. These may include, but are not limited to: failure to achieve desired goals, infection, bleeding, organ or nerve damage, allergic reactions,  paralysis, and death. In addition, the patient was informed of those risks and complications associated to the procedure, such  as failure to decrease pain; infection; bleeding; organ or nerve damage with subsequent damage to sensory, motor, and/or autonomic systems, resulting in permanent pain, numbness, and/or weakness of one or several areas of the body; allergic reactions; (i.e.: anaphylactic reaction); and/or death. Furthermore, the patient was informed of those risks and complications associated with the medications. These include, but are not limited to: allergic reactions (i.e.: anaphylactic or anaphylactoid reaction(s)); adrenal axis suppression; blood sugar elevation that in diabetics may result in ketoacidosis or comma; water retention that in patients with history of congestive heart failure may result in shortness of breath, pulmonary edema, and decompensation with resultant heart failure; weight gain; swelling or edema; medication-induced neural toxicity; particulate matter embolism and blood vessel occlusion with resultant organ, and/or nervous system infarction; and/or aseptic necrosis of one or more joints. Finally, the patient was informed that Medicine is not an exact science; therefore, there is also the possibility of unforeseen or unpredictable risks and/or possible complications that may result in a catastrophic outcome. The patient indicated having understood very clearly. We have given the patient no guarantees and we have made no promises. Enough time was given to the patient to ask questions, all of which were answered to the patient's satisfaction. Brianna Arnold has indicated that she wanted to continue with the procedure. Attestation: I, the ordering provider, attest that I have discussed with the patient the benefits, risks, side-effects, alternatives, likelihood of achieving goals, and potential problems during recovery for the procedure that I have provided informed consent. Date   Time: 01/25/2021  2:30 PM  Pre-Procedure Preparation:  Monitoring: As per clinic protocol. Respiration, ETCO2, SpO2, BP, heart rate and rhythm monitor placed and checked for adequate function Safety Precautions: Patient was assessed for positional comfort and pressure points before starting the procedure. Time-out: I initiated and conducted the "Time-out" before starting the procedure, as per protocol. The patient was asked to participate by confirming the accuracy of the "Time Out" information. Verification of the correct person, site, and procedure were performed and confirmed by me, the nursing staff, and the patient. "Time-out" conducted as per Joint Commission's Universal Protocol (UP.01.01.01). Time: 1456  Description of Procedure:          Area Prepped: Entire Lower Lumbosacral Region & Right cervical region DuraPrep (Iodine Povacrylex [0.7% available iodine] and Isopropyl Alcohol, 74% w/w) Safety Precautions: Aspiration looking for blood return was conducted prior to all injections. At no point did we inject any substances, as a needle was being advanced. No attempts were made at seeking any paresthesias. Safe injection practices and needle disposal techniques used. Medications properly checked for expiration dates. SDV (single dose vial) medications used. Description of the Procedure: Protocol guidelines were followed. The patient was placed in position over the fluoroscopy table. The target area was identified and the area prepped in the usual manner. Skin & deeper tissues infiltrated with local anesthetic. Appropriate amount of time allowed to pass for local anesthetics to take effect. The procedure needles were then advanced to the target area. Proper needle placement secured. Negative aspiration confirmed. Solution injected in intermittent fashion, asking for systemic symptoms every 0.5cc of injectate. The needles were then removed and the area cleansed, making sure to leave some of the  prepping solution back to take advantage of its long term bactericidal properties.  Vitals:   01/25/21 1451 01/25/21 1456 01/25/21 1503 01/25/21 1508  BP: (!) 162/133 126/88 132/70 (!) 139/93  Pulse:      Resp: 18 18 16  18  Temp:      TempSrc:      SpO2: 100% 100% 100% 99%  Weight:      Height:         Start Time: 1456 hrs. End Time: 1508 hrs. Materials:  Needle(s) Type: Regular needle Gauge: 27G Length: 1.5-in  Medication(s): Please see orders for medications and dosing details.  Approximately 10 trigger point injected in the left lower lumbar region with 0.5 to 1 cc of 0.2% ropivacaine with needling performed.  Approximately 8 trigger points were also injected in the right cervical, right trapezius region with dry needling performed.  Each trigger point was injected with 0.5 to 1 cc of 0.2% ropivacaine.  Imaging Guidance:          Type of Imaging Technique: Fluoroscopy Guidance (Spinal) Indication(s): Assistance in needle guidance and placement for procedures requiring needle placement in or near specific anatomical locations not easily accessible without such assistance. Exposure Time: No patient exposure Contrast: None used. Fluoroscopic Guidance: I was personally present during the use of fluoroscopy. "Tunnel Vision Technique" used to obtain the best possible view of the target area. Parallax error corrected before commencing the procedure. "Direction-depth-direction" technique used to introduce the needle under continuous pulsed fluoroscopy. Once target was reached, antero-posterior, oblique, and lateral fluoroscopic projection used confirm needle placement in all planes. Images permanently stored in EMR. Ultrasound Guidance: N/A   Post-operative Assessment:  Post-procedure Vital Signs:  Pulse/HCG Rate: (!) 10296 Temp: (!) 97.2 F (36.2 C) Resp: 18 BP: (!) 139/93 SpO2: 99 %  EBL: None  Complications: No immediate post-treatment complications observed by team, or  reported by patient.  Note: The patient tolerated the entire procedure well. A repeat set of vitals were taken after the procedure and the patient was kept under observation following institutional policy, for this type of procedure. Post-procedural neurological assessment was performed, showing return to baseline, prior to discharge. The patient was provided with post-procedure discharge instructions, including a section on how to identify potential problems. Should any problems arise concerning this procedure, the patient was given instructions to immediately contact us, at any time, without hesitation. In any case, we plan to contact the patient by telephone for a follow-up status report regarding this interventional procedure.  Comments:  No additional relevant information.  Plan of Care  Orders:  Orders Placed This Encounter  Procedures   DG PAIN [facility] C-ARM 1-60 MIN NO REPORT    Intraoperative interpretation by procedural physician at [facility].    Standing Status:   Standing    Number of Occurrences:   1    Order Specific Question:   Reason for exam:    Answer:   Assistance in needle guidance and placement for procedures requiring needle placement in or near specific anatomical locations not easily accessible without such assistance.      Medications ordered for procedure: Meds ordered this encounter  Medications   diazepam (VALIUM) tablet 5 mg    Make sure Flumazenil is available in the pyxis when using this medication. If oversedation occurs, administer 0.2 mg IV over 15 sec. If after 45 sec no response, administer 0.2 mg again over 1 min; may repeat at 1 min intervals; not to exceed 4 doses (1 mg)   ropivacaine (PF) 2 mg/mL (0.2%) (NAROPIN) injection 9 mL   ropivacaine (PF) 2 mg/mL (0.2%) (NAROPIN) injection 9 mL     Medications administered: We administered diazepam, ropivacaine (PF) 2 mg/mL (0.2%), and ropivacaine (PF) 2 mg/mL (0.2%).  See  the medical record  for exact dosing, route, and time of administration.  Follow-up plan:   Return for Keep sch. appt.       Pharmacological management options:  Opioid Analgesics: Butrans patch, side effects at 7.5 mcg an hour, analgesic benefit with no side effects at 5 mcg an hour, continue  Membrane stabilizer: Continue gabapentin as prescribed  Muscle relaxant: Continue Flexeril as prescribed  NSAID: To be determined at a later time  Other analgesic(s): To be determined at a later time    Interventional management options: Ms. Kemmer was informed that there is no guarantee that she would be a candidate for interventional therapies. The decision will be based on the results of diagnostic studies, as well as Ms. Romain's risk profile.  Procedure(s) hx Left axillary peripheral nerve stimulation: 12/30/19 providing significant pain relief.  Removed 02/29/2020              Recent Visits Date Type Provider Dept  12/28/20 Procedure visit Edward Jolly, MD [facility]  12/15/20 Office Visit Edward Jolly, MD [facility]  12/07/20 Procedure visit Edward Jolly, MD [facility]  11/16/20 Procedure visit Edward Jolly, MD [facility]  Showing recent visits within past 90 days and meeting all other requirements Today's Visits Date Type Provider Dept  01/25/21 Procedure visit Edward Jolly, MD [facility]  Showing today's visits and meeting all other requirements Future Appointments Date Type Provider Dept  04/13/21 Appointment Edward Jolly, MD [facility]  Showing future appointments within next 90 days and meeting all other requirements Disposition: Discharge home  Discharge (Date  Time): 01/25/2021; 1520 hrs.   Primary Care Physician: Marguarite Arbour, MD Location: [facility] Note by: Edward Jolly, MD Date: 01/25/2021; Time: 3:15 PM  Disclaimer:  Medicine is not an exact science. The only guarantee in medicine is  that nothing is guaranteed. It is important to note that the decision to proceed with this intervention was based on the information collected from the patient. The Data and conclusions were drawn from the patient's questionnaire, the interview, and the physical examination. Because the information was provided in large part by the patient, it cannot be guaranteed that it has not been purposely or unconsciously manipulated. Every effort has been made to obtain as much relevant data as possible for this evaluation. It is important to note that the conclusions that lead to this procedure are derived in large part from the available data. Always take into account that the treatment will also be dependent on availability of resources and existing treatment guidelines, considered by other Pain Management Practitioners as being common knowledge and practice, at the time of the intervention. For Medico-Legal purposes, it is also important to point out that variation in procedural techniques and pharmacological choices are the acceptable norm. The indications, contraindications, technique, and results of the above procedure should only be interpreted and judged by a Board-Certified Interventional Pain Specialist with extensive familiarity and expertise in the same exact procedure and technique.

## 2021-01-25 NOTE — Progress Notes (Signed)
Safety precautions to be maintained throughout the outpatient stay will include: orient to surroundings, keep bed in low position, maintain call bell within reach at all times, provide assistance with transfer out of bed and ambulation.  

## 2021-01-26 ENCOUNTER — Telehealth: Payer: Self-pay | Admitting: *Deleted

## 2021-01-26 NOTE — Telephone Encounter (Signed)
Called for post procedure check. No issues.

## 2021-02-15 ENCOUNTER — Ambulatory Visit
Admission: RE | Admit: 2021-02-15 | Discharge: 2021-02-15 | Disposition: A | Discharge status: Home / Self Care | Payer: Medicare HMO | Source: Ambulatory Visit | Attending: Student in an Organized Health Care Education/Training Program | Admitting: Student in an Organized Health Care Education/Training Program

## 2021-02-15 ENCOUNTER — Encounter: Payer: Self-pay | Admitting: Student in an Organized Health Care Education/Training Program

## 2021-02-15 ENCOUNTER — Other Ambulatory Visit: Payer: Self-pay

## 2021-02-15 ENCOUNTER — Ambulatory Visit (HOSPITAL_BASED_OUTPATIENT_CLINIC_OR_DEPARTMENT_OTHER): Payer: Medicare HMO | Admitting: Student in an Organized Health Care Education/Training Program

## 2021-02-15 VITALS — BP 139/102 | HR 88 | Temp 97.1°F | Resp 16 | Ht <= 58 in | Wt 149.0 lb

## 2021-02-15 DIAGNOSIS — G894 Chronic pain syndrome: Secondary | ICD-10-CM | POA: Insufficient documentation

## 2021-02-15 DIAGNOSIS — M7918 Myalgia, other site: Secondary | ICD-10-CM

## 2021-02-15 MED ORDER — ROPIVACAINE HCL 2 MG/ML IJ SOLN
9.0000 mL | Freq: Once | INTRAMUSCULAR | Status: AC
  Administered 2021-02-15: 9 mL via PERINEURAL

## 2021-02-15 MED ORDER — ROPIVACAINE HCL 2 MG/ML IJ SOLN
INTRAMUSCULAR | Status: AC
  Filled 2021-02-15: qty 20

## 2021-02-15 MED ORDER — DIAZEPAM 5 MG PO TABS
ORAL_TABLET | ORAL | Status: AC
  Filled 2021-02-15: qty 1

## 2021-02-15 MED ORDER — DIAZEPAM 5 MG PO TABS
5.0000 mg | ORAL_TABLET | ORAL | Status: AC
  Administered 2021-02-15: 5 mg via ORAL

## 2021-02-15 NOTE — Progress Notes (Signed)
PROVIDER NOTE: Information contained herein reflects review and annotations entered in association with encounter. Interpretation of such information and data should be left to medically-trained personnel. Information provided to patient can be located elsewhere in the medical record under "Patient Instructions". Document created using STT-dictation technology, any transcriptional errors that may result from process are unintentional.    Patient: Brianna Arnold  Service Category: Procedure  Provider: Edward Jolly, MD  DOB: June 21, 1990  DOS: 02/15/2021  Location: [facility]  MRN: [id number]  Setting: Ambulatory - outpatient  Referring Provider: Marguarite Arbour, MD  Type: Established Patient  Specialty: Interventional Pain Management  PCP: Marguarite Arbour, MD   Primary Reason for Visit: Interventional Pain Management Treatment. CC: Back Pain  Procedure:          Anesthesia, Analgesia, Anxiolysis:  Type: Lumbar Paraspinal Muscle Trigger Point Injection (3+)         Quadratus lumborum, erector spinae, multifidus and left trapzeius and periscapular region and Right cervical/trapezius TPI CPT: 20553 with fluoroscopy Primary Purpose: Therapeutic Approach: Percutaneous, ipsilateral approach. Laterality: Midline        Type: Local Anesthesia with PO Valium for anxiety   Position: Prone   Indications: 1. Myofascial pain syndrome of lumbar spine   2. Chronic pain syndrome      Pain Score: Pre-procedure: 7 /10 Post-procedure: 4 /10   Pre-op H&P Assessment:  Ms. Burkemper is a 30 y.o. (year old), female patient, seen today for interventional treatment. She  has a past surgical history that includes Cervical spine surgery (07/2014); Back surgery (10/2014); and Anterior cervical decomp/discectomy fusion (N/A, 02/11/2019). Ms. Diop has a current medication list which includes the following prescription(s): albuterol, buprenorphine, vitamin d3, cyclobenzaprine,  ergocalciferol, famotidine, ferrous sulfate, gabapentin, medroxyprogesterone, ofloxacin, promethazine, propranolol er, tramadol, trazodone, and vitamin b-12. Her primarily concern today is the Back Pain  Initial Vital Signs:  Pulse/HCG Rate: 95  Temp: (!) 97.1 F (36.2 C) Resp: 16 BP: 125/86 SpO2: 99 %  BMI: Estimated body mass index is 33.41 kg/m as calculated from the following:   Height as of this encounter: 4\' 8"  (1.422 m).   Weight as of this encounter: 149 lb (67.6 kg).  Risk Assessment: Allergies: Reviewed. She is allergic to hypafix [wound dressings], ativan [lorazepam], and tizanidine.  Allergy Precautions: None required Coagulopathies: Reviewed. None identified.  Blood-thinner therapy: None at this time Active Infection(s): Reviewed. None identified. Ms. Douse is afebrile  Site Confirmation: Ms. Lacko was asked to confirm the procedure and laterality before marking the site Procedure checklist: Completed Consent: Before the procedure and under the influence of no sedative(s), amnesic(s), or anxiolytics, the patient was informed of the treatment options, risks and possible complications. To fulfill our ethical and legal obligations, as recommended by the American Medical Association's Code of Ethics, I have informed the patient of my clinical impression; the nature and purpose of the treatment or procedure; the risks, benefits, and possible complications of the intervention; the alternatives, including doing nothing; the risk(s) and benefit(s) of the alternative treatment(s) or procedure(s); and the risk(s) and benefit(s) of doing nothing. The patient was provided information about the general risks and possible complications associated with the procedure. These may include, but are not limited to: failure to achieve desired goals, infection, bleeding, organ or nerve damage, allergic reactions, paralysis, and death. In addition, the patient was informed of those risks and  complications associated to the procedure, such as failure to decrease pain; infection; bleeding; organ or nerve damage with  subsequent damage to sensory, motor, and/or autonomic systems, resulting in permanent pain, numbness, and/or weakness of one or several areas of the body; allergic reactions; (i.e.: anaphylactic reaction); and/or death. Furthermore, the patient was informed of those risks and complications associated with the medications. These include, but are not limited to: allergic reactions (i.e.: anaphylactic or anaphylactoid reaction(s)); adrenal axis suppression; blood sugar elevation that in diabetics may result in ketoacidosis or comma; water retention that in patients with history of congestive heart failure may result in shortness of breath, pulmonary edema, and decompensation with resultant heart failure; weight gain; swelling or edema; medication-induced neural toxicity; particulate matter embolism and blood vessel occlusion with resultant organ, and/or nervous system infarction; and/or aseptic necrosis of one or more joints. Finally, the patient was informed that Medicine is not an exact science; therefore, there is also the possibility of unforeseen or unpredictable risks and/or possible complications that may result in a catastrophic outcome. The patient indicated having understood very clearly. We have given the patient no guarantees and we have made no promises. Enough time was given to the patient to ask questions, all of which were answered to the patient's satisfaction. Ms. Ceasar has indicated that she wanted to continue with the procedure. Attestation: I, the ordering provider, attest that I have discussed with the patient the benefits, risks, side-effects, alternatives, likelihood of achieving goals, and potential problems during recovery for the procedure that I have provided informed consent. Date  Time: 02/15/2021  1:53 PM  Pre-Procedure Preparation:  Monitoring: As per  clinic protocol. Respiration, ETCO2, SpO2, BP, heart rate and rhythm monitor placed and checked for adequate function Safety Precautions: Patient was assessed for positional comfort and pressure points before starting the procedure. Time-out: I initiated and conducted the "Time-out" before starting the procedure, as per protocol. The patient was asked to participate by confirming the accuracy of the "Time Out" information. Verification of the correct person, site, and procedure were performed and confirmed by me, the nursing staff, and the patient. "Time-out" conducted as per Joint Commission's Universal Protocol (UP.01.01.01). Time: 1429  Description of Procedure:          Area Prepped: Entire Lower Lumbosacral Region & Right cervical region DuraPrep (Iodine Povacrylex [0.7% available iodine] and Isopropyl Alcohol, 74% w/w) Safety Precautions: Aspiration looking for blood return was conducted prior to all injections. At no point did we inject any substances, as a needle was being advanced. No attempts were made at seeking any paresthesias. Safe injection practices and needle disposal techniques used. Medications properly checked for expiration dates. SDV (single dose vial) medications used. Description of the Procedure: Protocol guidelines were followed. The patient was placed in position over the fluoroscopy table. The target area was identified and the area prepped in the usual manner. Skin & deeper tissues infiltrated with local anesthetic. Appropriate amount of time allowed to pass for local anesthetics to take effect. The procedure needles were then advanced to the target area. Proper needle placement secured. Negative aspiration confirmed. Solution injected in intermittent fashion, asking for systemic symptoms every 0.5cc of injectate. The needles were then removed and the area cleansed, making sure to leave some of the prepping solution back to take advantage of its long term bactericidal  properties.  Vitals:   02/15/21 1355 02/15/21 1428 02/15/21 1433 02/15/21 1438  BP: 125/86 (!) 142/60 (!) 134/105 (!) 139/102  Pulse: 95 75 (!) 102 88  Resp: 16 16 15 16   Temp: (!) 97.1 F (36.2 C)  TempSrc: Temporal     SpO2: 99% 100% 100% 100%  Weight: 149 lb (67.6 kg)     Height: 4\' 8"  (1.422 m)        Start Time: 1429 hrs. End Time: 1438 hrs. Materials:  Needle(s) Type: Regular needle Gauge: 27G Length: 1.5-in  Medication(s): Please see orders for medications and dosing details.  Approximately 10 trigger point injected in the left lower lumbar region with 0.5 to 1 cc of 0.2% ropivacaine with needling performed.  Approximately 8 trigger points were also injected in the right cervical, right trapezius region with dry needling performed.  Each trigger point was injected with 0.5 to 1 cc of 0.2% ropivacaine.  Imaging Guidance:          Type of Imaging Technique: Fluoroscopy Guidance (Spinal) Indication(s): Assistance in needle guidance and placement for procedures requiring needle placement in or near specific anatomical locations not easily accessible without such assistance. Exposure Time: No patient exposure Contrast: None used. Fluoroscopic Guidance: I was personally present during the use of fluoroscopy. "Tunnel Vision Technique" used to obtain the best possible view of the target area. Parallax error corrected before commencing the procedure. "Direction-depth-direction" technique used to introduce the needle under continuous pulsed fluoroscopy. Once target was reached, antero-posterior, oblique, and lateral fluoroscopic projection used confirm needle placement in all planes. Images permanently stored in EMR. Ultrasound Guidance: N/A   Post-operative Assessment:  Post-procedure Vital Signs:  Pulse/HCG Rate: 88  Temp: (!) 97.1 F (36.2 C) Resp: 16 BP: (!) 139/102 SpO2: 100 %  EBL: None  Complications: No immediate post-treatment complications observed by team,  or reported by patient.  Note: The patient tolerated the entire procedure well. A repeat set of vitals were taken after the procedure and the patient was kept under observation following institutional policy, for this type of procedure. Post-procedural neurological assessment was performed, showing return to baseline, prior to discharge. The patient was provided with post-procedure discharge instructions, including a section on how to identify potential problems. Should any problems arise concerning this procedure, the patient was given instructions to immediately contact , at any time, without hesitation. In any case, we plan to contact the patient by telephone for a follow-up status report regarding this interventional procedure.  Comments:  No additional relevant information.  Plan of Care  Orders:  Orders Placed This Encounter  Procedures   DG PAIN [facility] C-ARM 1-60 MIN NO REPORT    Intraoperative interpretation by procedural physician at [facility].    Standing Status:   Standing    Number of Occurrences:   1    Order Specific Question:   Reason for exam:    Answer:   Assistance in needle guidance and placement for procedures requiring needle placement in or near specific anatomical locations not easily accessible without such assistance.      Medications ordered for procedure: Meds ordered this encounter  Medications   diazepam (VALIUM) tablet 5 mg    Make sure Flumazenil is available in the pyxis when using this medication. If oversedation occurs, administer 0.2 mg IV over 15 sec. If after 45 sec no response, administer 0.2 mg again over 1 min; may repeat at 1 min intervals; not to exceed 4 doses (1 mg)   ropivacaine (PF) 2 mg/mL (0.2%) (NAROPIN) injection 9 mL   ropivacaine (PF) 2 mg/mL (0.2%) (NAROPIN) injection 9 mL     Medications administered: We administered diazepam, ropivacaine (PF) 2 mg/mL (0.2%), and ropivacaine (PF) 2 mg/mL (0.2%).  See  the medical  record for exact dosing, route, and time of administration.  Follow-up plan:   Return if symptoms worsen or fail to improve.       Pharmacological management options:  Opioid Analgesics: Butrans patch, side effects at 7.5 mcg an hour, analgesic benefit with no side effects at 5 mcg an hour, continue  Membrane stabilizer: Continue gabapentin as prescribed  Muscle relaxant: Continue Flexeril as prescribed  NSAID: To be determined at a later time  Other analgesic(s): To be determined at a later time    Interventional management options: Ms. Creek was informed that there is no guarantee that she would be a candidate for interventional therapies. The decision will be based on the results of diagnostic studies, as well as Ms. Dougan's risk profile.  Procedure(s) hx Left axillary peripheral nerve stimulation: 12/30/19 providing significant pain relief.  Removed 02/29/2020              Recent Visits Date Type Provider Dept  01/25/21 Procedure visit Edward Jolly, MD [facility]  12/28/20 Procedure visit Edward Jolly, MD [facility]  12/15/20 Office Visit Edward Jolly, MD [facility]  12/07/20 Procedure visit Edward Jolly, MD [facility]  Showing recent visits within past 90 days and meeting all other requirements Today's Visits Date Type Provider Dept  02/15/21 Procedure visit Edward Jolly, MD [facility]  Showing today's visits and meeting all other requirements Future Appointments Date Type Provider Dept  03/08/21 Appointment Edward Jolly, MD [facility]  04/13/21 Appointment Edward Jolly, MD [facility]  Showing future appointments within next 90 days and meeting all other requirements Disposition: Discharge home  Discharge (Date  Time): 02/15/2021; 1450 hrs.   Primary Care Physician: Marguarite Arbour, MD Location: [facility] Note by: Edward Jolly, MD Date: 02/15/2021; Time:  2:54 PM  Disclaimer:  Medicine is not an exact science. The only guarantee in medicine is that nothing is guaranteed. It is important to note that the decision to proceed with this intervention was based on the information collected from the patient. The Data and conclusions were drawn from the patient's questionnaire, the interview, and the physical examination. Because the information was provided in large part by the patient, it cannot be guaranteed that it has not been purposely or unconsciously manipulated. Every effort has been made to obtain as much relevant data as possible for this evaluation. It is important to note that the conclusions that lead to this procedure are derived in large part from the available data. Always take into account that the treatment will also be dependent on availability of resources and existing treatment guidelines, considered by other Pain Management Practitioners as being common knowledge and practice, at the time of the intervention. For Medico-Legal purposes, it is also important to point out that variation in procedural techniques and pharmacological choices are the acceptable norm. The indications, contraindications, technique, and results of the above procedure should only be interpreted and judged by a Board-Certified Interventional Pain Specialist with extensive familiarity and expertise in the same exact procedure and technique.

## 2021-02-15 NOTE — Progress Notes (Signed)
Safety precautions to be maintained throughout the outpatient stay will include: orient to surroundings, keep bed in low position, maintain call bell within reach at all times, provide assistance with transfer out of bed and ambulation.  

## 2021-02-15 NOTE — Patient Instructions (Signed)
Pain Management Discharge Instructions ? ?General Discharge Instructions : ? ?If you need to reach your doctor call: Monday-Friday 8:00 am - 4:00 pm at [phone] or toll free [phone].  After clinic hours [phone] to have operator reach doctor. ? ?Bring all of your medication bottles to all your appointments in the pain clinic. ? ?To cancel or reschedule your appointment with Pain Management please remember to call 24 hours in advance to avoid a fee. ? ?Refer to the educational materials which you have been given on: General Risks, I had my Procedure. Discharge Instructions, Post Sedation. ? ?Post Procedure Instructions: ? ?The drugs you were given will stay in your system until tomorrow, so for the next 24 hours you should not drive, make any legal decisions or drink any alcoholic beverages. ? ?You may eat anything you prefer, but it is better to start with liquids then soups and crackers, and gradually work up to solid foods. ? ?Please notify your doctor immediately if you have any unusual bleeding, trouble breathing or pain that is not related to your normal pain. ? ?Depending on the type of procedure that was done, some parts of your body may feel week and/or numb.  This usually clears up by tonight or the next day. ? ?Walk with the use of an assistive device or accompanied by an adult for the 24 hours. ? ?You may use ice on the affected area for the first 24 hours.  Put ice in a Ziploc bag and cover with a towel and place against area 15 minutes on 15 minutes off.  You may switch to heat after 24 hours.Trigger Point Injection ?Trigger points are areas where you have pain. A trigger point injection is a shot given in the trigger point to help relieve pain for a few days to a few months. Common places for trigger points include the neck, shoulders, upper back, or lower back. ?A trigger point injection will not cure long-term (chronic) pain permanently. These injections do not always work for every  person. For some people, they can help to relieve pain for a few days to a few months. ?Tell a health care provider about: ?Any allergies you have. ?All medicines you are taking, including vitamins, herbs, eye drops, creams, and over-the-counter medicines. ?Any problems you or family members have had with anesthetic medicines. ?Any bleeding problems you have. ?Any surgeries you have had. ?Any medical conditions you have. ?Whether you are pregnant or may be pregnant. ?What are the risks? ?Generally, this is a safe procedure. However, problems may occur, including: ?Infection. ?Bleeding or bruising. ?Allergic reaction to the injected medicine. ?Irritation of the skin around the injection site. ?What happens before the procedure? ?Ask your health care provider about: ?Changing or stopping your regular medicines. This is especially important if you are taking diabetes medicines or blood thinners. ?Taking medicines such as aspirin and ibuprofen. These medicines can thin your blood. Do not take these medicines unless your health care provider tells you to take them. ?Taking over-the-counter medicines, vitamins, herbs, and supplements. ?What happens during the procedure? ? ?Your health care provider will feel for trigger points. A marker may be used to circle the area for the injection. ?The skin over the trigger point will be washed with a germ-killing soap. ?You may be given a medicine to help you relax (sedative). ?A thin needle is used for the injection. You may feel pain or a twitching feeling when the needle enters your skin. ?A numbing solution may be injected   into the trigger point. Sometimes a medicine to keep down inflammation is also injected. ?Your health care provider may move the needle around the area where the trigger point is located until the tightness and twitching goes away. ?After the injection, your health care provider may put gentle pressure over the injection site. ?The injection site will be  covered with a bandage (dressing). ?The procedure may vary among health care providers and hospitals. ?What can I expect after treatment? ?After treatment, you may have soreness and stiffness for 1-2 days. ?Follow these instructions at home: ?Injection site care ?Remove your dressing in a few hours, or as told by your health care provider. ?Check your injection site every day for signs of infection. Check for: ?Redness, swelling, or pain. ?Fluid or blood. ?Warmth. ?Pus or a bad smell. ?Managing pain, stiffness, and swelling ?If directed, put ice on the affected area. To do this: ?Put ice in a plastic bag. ?Place a towel between your skin and the bag. ?Leave the ice on for 20 minutes, 2-3 times a day. ?Remove the ice if your skin turns bright red. This is very important. If you cannot feel pain, heat, or cold, you have a greater risk of damage to the area. ?Activity ?If you were given a sedative during the procedure, it can affect you for several hours. Do not drive or operate machinery until your health care provider says that it is safe. ?Do not take baths, swim, or use a hot tub until your health care provider approves. ?Return to your normal activities as told by your health care provider. Ask your health care provider what activities are safe for you. ?General instructions ?If you were asked to stop your regular medicines, ask your health care provider when you may start taking them again. ?You may be asked to see an occupational or physical therapist for exercises that reduce muscle strain and stretch the area of the trigger point. ?Keep all follow-up visits. This is important. ?Contact a health care provider if: ?Your pain comes back, and it is worse than before the injection. You may need more injections. ?You have chills or a fever. ?The injection site becomes more painful, red, swollen, or warm to the touch. ?Summary ?A trigger point injection is a shot given in the trigger point to help relieve  pain. ?Common places for trigger point injections are the neck, shoulders, upper back, and lower back. ?These injections do not always work for every person, but for some people, the injections can help to relieve pain for a few days to a few months. ?Contact a health care provider if symptoms come back or if they are worse than before treatment. Also, get help if the injection site becomes more painful, red, swollen, or warm to the touch. ?This information is not intended to replace advice given to you by your health care provider. Make sure you discuss any questions you have with your health care provider. ?Document Revised: 07/19/2020 Document Reviewed: 07/19/2020 ?Elsevier Patient Education ? 2022 [employer] ? ?

## 2021-03-08 ENCOUNTER — Ambulatory Visit
Admission: RE | Admit: 2021-03-08 | Discharge: 2021-03-08 | Disposition: A | Discharge status: Home / Self Care | Payer: Medicare HMO | Source: Ambulatory Visit | Attending: Student in an Organized Health Care Education/Training Program | Admitting: Student in an Organized Health Care Education/Training Program

## 2021-03-08 ENCOUNTER — Other Ambulatory Visit: Payer: Self-pay

## 2021-03-08 ENCOUNTER — Ambulatory Visit (HOSPITAL_BASED_OUTPATIENT_CLINIC_OR_DEPARTMENT_OTHER): Payer: Medicare HMO | Admitting: Student in an Organized Health Care Education/Training Program

## 2021-03-08 ENCOUNTER — Encounter: Payer: Self-pay | Admitting: Student in an Organized Health Care Education/Training Program

## 2021-03-08 VITALS — BP 110/90 | HR 107 | Temp 97.0°F | Resp 18 | Ht <= 58 in | Wt 149.0 lb

## 2021-03-08 DIAGNOSIS — G894 Chronic pain syndrome: Secondary | ICD-10-CM | POA: Diagnosis present

## 2021-03-08 DIAGNOSIS — M4105 Infantile idiopathic scoliosis, thoracolumbar region: Secondary | ICD-10-CM

## 2021-03-08 DIAGNOSIS — M7918 Myalgia, other site: Secondary | ICD-10-CM

## 2021-03-08 MED ORDER — ROPIVACAINE HCL 2 MG/ML IJ SOLN
INTRAMUSCULAR | Status: AC
  Filled 2021-03-08: qty 20

## 2021-03-08 MED ORDER — DIAZEPAM 5 MG PO TABS
ORAL_TABLET | ORAL | Status: AC
  Filled 2021-03-08: qty 1

## 2021-03-08 MED ORDER — ROPIVACAINE HCL 2 MG/ML IJ SOLN: 9.0000 mL | Freq: Once | INTRAMUSCULAR | Status: DC

## 2021-03-08 MED ORDER — ROPIVACAINE HCL 2 MG/ML IJ SOLN
9.0000 mL | Freq: Once | INTRAMUSCULAR | Status: AC
  Administered 2021-03-08: 20 mL via PERINEURAL

## 2021-03-08 NOTE — Progress Notes (Signed)
PROVIDER NOTE: Information contained herein reflects review and annotations entered in association with encounter. Interpretation of such information and data should be left to medically-trained personnel. Information provided to patient can be located elsewhere in the medical record under "Patient Instructions". Document created using STT-dictation technology, any transcriptional errors that may result from process are unintentional.    Patient: Brianna Arnold  Service Category: Procedure  Provider: Edward Jolly, MD  DOB: 19-Apr-1991  DOS: 03/08/2021  Location: [facility]  MRN: [id number]  Setting: Ambulatory - outpatient  Referring Provider: Marguarite Arbour, MD  Type: Established Patient  Specialty: Interventional Pain Management  PCP: Marguarite Arbour, MD   Primary Reason for Visit: Interventional Pain Management Treatment. CC: No chief complaint on file.  Procedure:          Anesthesia, Analgesia, Anxiolysis:  Type: Lumbar Paraspinal Muscle Trigger Point Injection (3+)         Quadratus lumborum, erector spinae, multifidus and left trapzeius and periscapular region and Right cervical/trapezius TPI CPT: 20553 with fluoroscopy Primary Purpose: Therapeutic Approach: Percutaneous, ipsilateral approach. Laterality: Midline        Type: Local Anesthesia with PO Valium for anxiety   Position: Prone   Indications: 1. Myofascial pain syndrome of lumbar spine   2. Infantile idiopathic scoliosis of thoracolumbar region   3. Chronic pain syndrome       Pain Score: Pre-procedure: 6 /10 Post-procedure: 0-No pain/10   Pre-op H&P Assessment:  Brianna Arnold is a 30 y.o. (year old), female patient, seen today for interventional treatment. She  has a past surgical history that includes Cervical spine surgery (07/2014); Back surgery (10/2014); and Anterior cervical decomp/discectomy fusion (N/A, 02/11/2019). Brianna Arnold has a current medication list which includes the  following prescription(s): albuterol, buprenorphine, vitamin d3, cyclobenzaprine, ergocalciferol, famotidine, ferrous sulfate, gabapentin, medroxyprogesterone, ofloxacin, promethazine, propranolol er, tramadol, trazodone, and vitamin b-12, and the following Facility-Administered Medications: ropivacaine (pf) 2 mg/ml (0.2%). Her primarily concern today is the No chief complaint on file.  Initial Vital Signs:  Pulse/HCG Rate: (!) 107ECG Heart Rate: 95 Temp: (!) 97 F (36.1 C) Resp: 16 BP: (!) 126/92 SpO2: 99 %  BMI: Estimated body mass index is 34.63 kg/m as calculated from the following:   Height as of this encounter: 4\' 7"  (1.397 m).   Weight as of this encounter: 149 lb (67.6 kg).  Risk Assessment: Allergies: Reviewed. She is allergic to hypafix [wound dressings], ativan [lorazepam], and tizanidine.  Allergy Precautions: None required Coagulopathies: Reviewed. None identified.  Blood-thinner therapy: None at this time Active Infection(s): Reviewed. None identified. Brianna Arnold is afebrile  Site Confirmation: Brianna Arnold was asked to confirm the procedure and laterality before marking the site Procedure checklist: Completed Consent: Before the procedure and under the influence of no sedative(s), amnesic(s), or anxiolytics, the patient was informed of the treatment options, risks and possible complications. To fulfill our ethical and legal obligations, as recommended by the American Medical Association's Code of Ethics, I have informed the patient of my clinical impression; the nature and purpose of the treatment or procedure; the risks, benefits, and possible complications of the intervention; the alternatives, including doing nothing; the risk(s) and benefit(s) of the alternative treatment(s) or procedure(s); and the risk(s) and benefit(s) of doing nothing. The patient was provided information about the general risks and possible complications associated with the procedure. These may include,  but are not limited to: failure to achieve desired goals, infection, bleeding, organ or nerve damage, allergic reactions, paralysis,  and death. In addition, the patient was informed of those risks and complications associated to the procedure, such as failure to decrease pain; infection; bleeding; organ or nerve damage with subsequent damage to sensory, motor, and/or autonomic systems, resulting in permanent pain, numbness, and/or weakness of one or several areas of the body; allergic reactions; (i.e.: anaphylactic reaction); and/or death. Furthermore, the patient was informed of those risks and complications associated with the medications. These include, but are not limited to: allergic reactions (i.e.: anaphylactic or anaphylactoid reaction(s)); adrenal axis suppression; blood sugar elevation that in diabetics may result in ketoacidosis or comma; water retention that in patients with history of congestive heart failure may result in shortness of breath, pulmonary edema, and decompensation with resultant heart failure; weight gain; swelling or edema; medication-induced neural toxicity; particulate matter embolism and blood vessel occlusion with resultant organ, and/or nervous system infarction; and/or aseptic necrosis of one or more joints. Finally, the patient was informed that Medicine is not an exact science; therefore, there is also the possibility of unforeseen or unpredictable risks and/or possible complications that may result in a catastrophic outcome. The patient indicated having understood very clearly. We have given the patient no guarantees and we have made no promises. Enough time was given to the patient to ask questions, all of which were answered to the patient's satisfaction. Brianna Arnold has indicated that she wanted to continue with the procedure. Attestation: I, the ordering provider, attest that I have discussed with the patient the benefits, risks, side-effects, alternatives, likelihood of  achieving goals, and potential problems during recovery for the procedure that I have provided informed consent. Date  Time: 03/08/2021  2:27 PM  Pre-Procedure Preparation:  Monitoring: As per clinic protocol. Respiration, ETCO2, SpO2, BP, heart rate and rhythm monitor placed and checked for adequate function Safety Precautions: Patient was assessed for positional comfort and pressure points before starting the procedure. Time-out: I initiated and conducted the "Time-out" before starting the procedure, as per protocol. The patient was asked to participate by confirming the accuracy of the "Time Out" information. Verification of the correct person, site, and procedure were performed and confirmed by me, the nursing staff, and the patient. "Time-out" conducted as per Joint Commission's Universal Protocol (UP.01.01.01). Time: 1500  Description of Procedure:          Area Prepped: Entire Lower Lumbosacral Region & Right cervical region DuraPrep (Iodine Povacrylex [0.7% available iodine] and Isopropyl Alcohol, 74% w/w) Safety Precautions: Aspiration looking for blood return was conducted prior to all injections. At no point did we inject any substances, as a needle was being advanced. No attempts were made at seeking any paresthesias. Safe injection practices and needle disposal techniques used. Medications properly checked for expiration dates. SDV (single dose vial) medications used. Description of the Procedure: Protocol guidelines were followed. The patient was placed in position over the fluoroscopy table. The target area was identified and the area prepped in the usual manner. Skin & deeper tissues infiltrated with local anesthetic. Appropriate amount of time allowed to pass for local anesthetics to take effect. The procedure needles were then advanced to the target area. Proper needle placement secured. Negative aspiration confirmed. Solution injected in intermittent fashion, asking for systemic  symptoms every 0.5cc of injectate. The needles were then removed and the area cleansed, making sure to leave some of the prepping solution back to take advantage of its long term bactericidal properties.  Vitals:   03/08/21 1454 03/08/21 1500 03/08/21 1505 03/08/21 1508  BP: Marland Kitchen)  135/113 (!) 130/111 (!) 138/104 110/90  Pulse:      Resp: 18 16 15 18   Temp:      TempSrc:      SpO2: 98% 100% 100% 100%  Weight:      Height:         Start Time: 1500 hrs. End Time: 1507 hrs. Materials:  Needle(s) Type: Regular needle Gauge: 27G Length: 1.5-in  Medication(s): Please see orders for medications and dosing details.  3 trigger point injected in the left lower lumbar region with 0.5 to 1 cc of 0.2% ropivacaine with needling performed.  9 trigger points were also injected in the right cervical, right trapezius region with dry needling performed.  Each trigger point was injected with 0.5 to 1 cc of 0.2% ropivacaine.  Imaging Guidance:          Type of Imaging Technique: Fluoroscopy Guidance (Spinal) Indication(s): Assistance in needle guidance and placement for procedures requiring needle placement in or near specific anatomical locations not easily accessible without such assistance. Exposure Time: No patient exposure Contrast: None used. Fluoroscopic Guidance: I was personally present during the use of fluoroscopy. "Tunnel Vision Technique" used to obtain the best possible view of the target area. Parallax error corrected before commencing the procedure. "Direction-depth-direction" technique used to introduce the needle under continuous pulsed fluoroscopy. Once target was reached, antero-posterior, oblique, and lateral fluoroscopic projection used confirm needle placement in all planes. Images permanently stored in EMR. Ultrasound Guidance: N/A   Post-operative Assessment:  Post-procedure Vital Signs:  Pulse/HCG Rate: (!) 107(!) 106 Temp: (!) 97 F (36.1 C) Resp: 18 BP: 110/90 SpO2: 100  %  EBL: None  Complications: No immediate post-treatment complications observed by team, or reported by patient.  Note: The patient tolerated the entire procedure well. A repeat set of vitals were taken after the procedure and the patient was kept under observation following institutional policy, for this type of procedure. Post-procedural neurological assessment was performed, showing return to baseline, prior to discharge. The patient was provided with post-procedure discharge instructions, including a section on how to identify potential problems. Should any problems arise concerning this procedure, the patient was given instructions to immediately contact , at any time, without hesitation. In any case, we plan to contact the patient by telephone for a follow-up status report regarding this interventional procedure.  Comments:  No additional relevant information.  Plan of Care  Orders:  Orders Placed This Encounter  Procedures   DG PAIN CLIN[facility] C-ARM 1-60 MIN NO REPORT    Intraoperative interpretation by procedural physician at [facility].    Standing Status:   Standing    Number of Occurrences:   1    Order Specific Question:   Reason for exam:    Answer:   Assistance in needle guidance and placement for procedures requiring needle placement in or near specific anatomical locations not easily accessible without such assistance.       Medications ordered for procedure: Meds ordered this encounter  Medications   ropivacaine (PF) 2 mg/mL (0.2%) (NAROPIN) injection 9 mL   ropivacaine (PF) 2 mg/mL (0.2%) (NAROPIN) injection 9 mL      Medications administered: We administered ropivacaine (PF) 2 mg/mL (0.2%).  See the medical record for exact dosing, route, and time of administration.  Follow-up plan:   Return for Keep sch. appt.       Pharmacological management options:  Opioid Analgesics: Butrans patch, side effects at 7.5 mcg an hour, analgesic benefit with no  side effects at 5 mcg an hour, continue  Membrane stabilizer: Continue gabapentin as prescribed  Muscle relaxant: Continue Flexeril as prescribed  NSAID: To be determined at a later time  Other analgesic(s): To be determined at a later time    Interventional management options: Brianna Arnold was informed that there is no guarantee that she would be a candidate for interventional therapies. The decision will be based on the results of diagnostic studies, as well as Brianna Arnold's risk profile.  Procedure(s) hx Left axillary peripheral nerve stimulation: 12/30/19 providing significant pain relief.  Removed 02/29/2020              Recent Visits Date Type Provider Dept  02/15/21 Procedure visit Edward Jolly, MD [facility]  01/25/21 Procedure visit Edward Jolly, MD [facility]  12/28/20 Procedure visit Edward Jolly, MD [facility]  12/15/20 Office Visit Edward Jolly, MD [facility]  Showing recent visits within past 90 days and meeting all other requirements Today's Visits Date Type Provider Dept  03/08/21 Procedure visit Edward Jolly, MD [facility]  Showing today's visits and meeting all other requirements Future Appointments Date Type Provider Dept  03/29/21 Appointment Edward Jolly, MD [facility]  04/13/21 Appointment Edward Jolly, MD [facility]  Showing future appointments within next 90 days and meeting all other requirements Disposition: Discharge home  Discharge (Date  Time): 03/08/2021; 1515 hrs.   Primary Care Physician: Marguarite Arbour, MD Location: [facility] Note by: Edward Jolly, MD Date: 03/08/2021; Time: 3:27 PM  Disclaimer:  Medicine is not an exact science. The only guarantee in medicine is that nothing is guaranteed. It is important to note that the decision to proceed with this intervention was based on the information collected from the patient. The Data and  conclusions were drawn from the patient's questionnaire, the interview, and the physical examination. Because the information was provided in large part by the patient, it cannot be guaranteed that it has not been purposely or unconsciously manipulated. Every effort has been made to obtain as much relevant data as possible for this evaluation. It is important to note that the conclusions that lead to this procedure are derived in large part from the available data. Always take into account that the treatment will also be dependent on availability of resources and existing treatment guidelines, considered by other Pain Management Practitioners as being common knowledge and practice, at the time of the intervention. For Medico-Legal purposes, it is also important to point out that variation in procedural techniques and pharmacological choices are the acceptable norm. The indications, contraindications, technique, and results of the above procedure should only be interpreted and judged by a Board-Certified Interventional Pain Specialist with extensive familiarity and expertise in the same exact procedure and technique.

## 2021-03-08 NOTE — Patient Instructions (Signed)
Pain Management Discharge Instructions ? ?General Discharge Instructions : ? ?If you need to reach your doctor call: Monday-Friday 8:00 am - 4:00 pm at [phone] or toll free [phone].  After clinic hours [phone] to have operator reach doctor. ? ?Bring all of your medication bottles to all your appointments in the pain clinic. ? ?To cancel or reschedule your appointment with Pain Management please remember to call 24 hours in advance to avoid a fee. ? ?Refer to the educational materials which you have been given on: General Risks, I had my Procedure. Discharge Instructions, Post Sedation. ? ?Post Procedure Instructions: ? ?The drugs you were given will stay in your system until tomorrow, so for the next 24 hours you should not drive, make any legal decisions or drink any alcoholic beverages. ? ?You may eat anything you prefer, but it is better to start with liquids then soups and crackers, and gradually work up to solid foods. ? ?Please notify your doctor immediately if you have any unusual bleeding, trouble breathing or pain that is not related to your normal pain. ? ?Depending on the type of procedure that was done, some parts of your body may feel week and/or numb.  This usually clears up by tonight or the next day. ? ?Walk with the use of an assistive device or accompanied by an adult for the 24 hours. ? ?You may use ice on the affected area for the first 24 hours.  Put ice in a Ziploc bag and cover with a towel and place against area 15 minutes on 15 minutes off.  You may switch to heat after 24 hours.Trigger Point Injection ?Trigger points are areas where you have pain. A trigger point injection is a shot given in the trigger point to help relieve pain for a few days to a few months. Common places for trigger points include the neck, shoulders, upper back, or lower back. ?A trigger point injection will not cure long-term (chronic) pain permanently. These injections do not always work for every  person. For some people, they can help to relieve pain for a few days to a few months. ?Tell a health care provider about: ?Any allergies you have. ?All medicines you are taking, including vitamins, herbs, eye drops, creams, and over-the-counter medicines. ?Any problems you or family members have had with anesthetic medicines. ?Any bleeding problems you have. ?Any surgeries you have had. ?Any medical conditions you have. ?Whether you are pregnant or may be pregnant. ?What are the risks? ?Generally, this is a safe procedure. However, problems may occur, including: ?Infection. ?Bleeding or bruising. ?Allergic reaction to the injected medicine. ?Irritation of the skin around the injection site. ?What happens before the procedure? ?Ask your health care provider about: ?Changing or stopping your regular medicines. This is especially important if you are taking diabetes medicines or blood thinners. ?Taking medicines such as aspirin and ibuprofen. These medicines can thin your blood. Do not take these medicines unless your health care provider tells you to take them. ?Taking over-the-counter medicines, vitamins, herbs, and supplements. ?What happens during the procedure? ? ?Your health care provider will feel for trigger points. A marker may be used to circle the area for the injection. ?The skin over the trigger point will be washed with a germ-killing soap. ?You may be given a medicine to help you relax (sedative). ?A thin needle is used for the injection. You may feel pain or a twitching feeling when the needle enters your skin. ?A numbing solution may be injected   into the trigger point. Sometimes a medicine to keep down inflammation is also injected. ?Your health care provider may move the needle around the area where the trigger point is located until the tightness and twitching goes away. ?After the injection, your health care provider may put gentle pressure over the injection site. ?The injection site will be  covered with a bandage (dressing). ?The procedure may vary among health care providers and hospitals. ?What can I expect after treatment? ?After treatment, you may have soreness and stiffness for 1-2 days. ?Follow these instructions at home: ?Injection site care ?Remove your dressing in a few hours, or as told by your health care provider. ?Check your injection site every day for signs of infection. Check for: ?Redness, swelling, or pain. ?Fluid or blood. ?Warmth. ?Pus or a bad smell. ?Managing pain, stiffness, and swelling ?If directed, put ice on the affected area. To do this: ?Put ice in a plastic bag. ?Place a towel between your skin and the bag. ?Leave the ice on for 20 minutes, 2-3 times a day. ?Remove the ice if your skin turns bright red. This is very important. If you cannot feel pain, heat, or cold, you have a greater risk of damage to the area. ?Activity ?If you were given a sedative during the procedure, it can affect you for several hours. Do not drive or operate machinery until your health care provider says that it is safe. ?Do not take baths, swim, or use a hot tub until your health care provider approves. ?Return to your normal activities as told by your health care provider. Ask your health care provider what activities are safe for you. ?General instructions ?If you were asked to stop your regular medicines, ask your health care provider when you may start taking them again. ?You may be asked to see an occupational or physical therapist for exercises that reduce muscle strain and stretch the area of the trigger point. ?Keep all follow-up visits. This is important. ?Contact a health care provider if: ?Your pain comes back, and it is worse than before the injection. You may need more injections. ?You have chills or a fever. ?The injection site becomes more painful, red, swollen, or warm to the touch. ?Summary ?A trigger point injection is a shot given in the trigger point to help relieve  pain. ?Common places for trigger point injections are the neck, shoulders, upper back, and lower back. ?These injections do not always work for every person, but for some people, the injections can help to relieve pain for a few days to a few months. ?Contact a health care provider if symptoms come back or if they are worse than before treatment. Also, get help if the injection site becomes more painful, red, swollen, or warm to the touch. ?This information is not intended to replace advice given to you by your health care provider. Make sure you discuss any questions you have with your health care provider. ?Document Revised: 07/19/2020 Document Reviewed: 07/19/2020 ?Elsevier Patient Education ? 2022 [employer] ? ?

## 2021-03-08 NOTE — Progress Notes (Signed)
Safety precautions to be maintained throughout the outpatient stay will include: orient to surroundings, keep bed in low position, maintain call bell within reach at all times, provide assistance with transfer out of bed and ambulation.  

## 2021-03-09 ENCOUNTER — Telehealth: Payer: Self-pay | Admitting: *Deleted

## 2021-03-09 NOTE — Telephone Encounter (Signed)
Called for post procedure check. No answer. LVM. 

## 2021-03-29 ENCOUNTER — Encounter: Payer: Self-pay | Admitting: Student in an Organized Health Care Education/Training Program

## 2021-03-29 ENCOUNTER — Ambulatory Visit (HOSPITAL_BASED_OUTPATIENT_CLINIC_OR_DEPARTMENT_OTHER): Payer: Medicare HMO | Admitting: Student in an Organized Health Care Education/Training Program

## 2021-03-29 ENCOUNTER — Ambulatory Visit
Admission: RE | Admit: 2021-03-29 | Discharge: 2021-03-29 | Disposition: A | Discharge status: Home / Self Care | Payer: Medicare HMO | Source: Ambulatory Visit | Attending: Student in an Organized Health Care Education/Training Program | Admitting: Student in an Organized Health Care Education/Training Program

## 2021-03-29 ENCOUNTER — Other Ambulatory Visit: Payer: Self-pay

## 2021-03-29 VITALS — BP 133/93 | HR 86 | Temp 97.1°F | Resp 16 | Wt 149.0 lb

## 2021-03-29 DIAGNOSIS — M7918 Myalgia, other site: Secondary | ICD-10-CM | POA: Insufficient documentation

## 2021-03-29 DIAGNOSIS — G894 Chronic pain syndrome: Secondary | ICD-10-CM | POA: Insufficient documentation

## 2021-03-29 DIAGNOSIS — M4105 Infantile idiopathic scoliosis, thoracolumbar region: Secondary | ICD-10-CM | POA: Insufficient documentation

## 2021-03-29 MED ORDER — ROPIVACAINE HCL 2 MG/ML IJ SOLN
9.0000 mL | Freq: Once | INTRAMUSCULAR | Status: AC
  Administered 2021-03-29: 20 mL via PERINEURAL

## 2021-03-29 MED ORDER — DIAZEPAM 5 MG PO TABS
5.0000 mg | ORAL_TABLET | ORAL | Status: AC
  Administered 2021-03-29: 5 mg via ORAL

## 2021-03-29 MED ORDER — DIAZEPAM 5 MG PO TABS
ORAL_TABLET | ORAL | Status: AC
  Filled 2021-03-29: qty 1

## 2021-03-29 MED ORDER — ROPIVACAINE HCL 2 MG/ML IJ SOLN
INTRAMUSCULAR | Status: AC
  Filled 2021-03-29: qty 20

## 2021-03-29 NOTE — Progress Notes (Signed)
PROVIDER NOTE: Information contained herein reflects review and annotations entered in association with encounter. Interpretation of such information and data should be left to medically-trained personnel. Information provided to patient can be located elsewhere in the medical record under "Patient Instructions". Document created using STT-dictation technology, any transcriptional errors that may result from process are unintentional.    Patient: Brianna Arnold  Service Category: Procedure  Provider: Edward Jolly, MD  DOB: 1990-10-09  DOS: 03/29/2021  Location: [facility]  MRN: [id number]  Setting: Ambulatory - outpatient  Referring Provider: Marguarite Arbour, MD  Type: Established Patient  Specialty: Interventional Pain Management  PCP: Marguarite Arbour, MD   Primary Reason for Visit: Interventional Pain Management Treatment. CC: Neck Pain (lower)  Procedure:          Anesthesia, Analgesia, Anxiolysis:  Type: Lumbar Paraspinal Muscle Trigger Point Injection (3+)         Quadratus lumborum, erector spinae, multifidus and left trapzeius and periscapular region and Right cervical/trapezius TPI CPT: 20553 with fluoroscopy Primary Purpose: Therapeutic Approach: Percutaneous, ipsilateral approach. Laterality: Midline        Type: Local Anesthesia with PO Valium for anxiety   Position: Prone   Indications: 1. Myofascial pain syndrome of lumbar spine   2. Infantile idiopathic scoliosis of thoracolumbar region   3. Chronic pain syndrome       Pain Score: Pre-procedure: 7 /10 Post-procedure: 0-No pain/10   Pre-op H&P Assessment:  Brianna Arnold is a 30 y.o. (year old), female patient, seen today for interventional treatment. She  has a past surgical history that includes Cervical spine surgery (07/2014); Back surgery (10/2014); and Anterior cervical decomp/discectomy fusion (N/A, 02/11/2019). Brianna Arnold has a current medication list which includes the following  prescription(s): albuterol, buprenorphine, vitamin d3, cyclobenzaprine, ergocalciferol, famotidine, ferrous sulfate, gabapentin, medroxyprogesterone, ofloxacin, promethazine, propranolol er, tramadol, trazodone, and vitamin b-12. Her primarily concern today is the Neck Pain (lower)  Initial Vital Signs:  Pulse/HCG Rate: 86ECG Heart Rate: 83 Temp: (!) 97.1 F (36.2 C) Resp: 16 BP: 127/83 SpO2: 100 %  BMI: Estimated body mass index is 34.63 kg/m as calculated from the following:   Height as of 03/08/21:  (1.397 m).   Weight as of this encounter: 149 lb (67.6 kg).  Risk Assessment: Allergies: Reviewed. She is allergic to hypafix [wound dressings], ativan [lorazepam], and tizanidine.  Allergy Precautions: None required Coagulopathies: Reviewed. None identified.  Blood-thinner therapy: None at this time Active Infection(s): Reviewed. None identified. Brianna Arnold is afebrile  Site Confirmation: Brianna Arnold was asked to confirm the procedure and laterality before marking the site Procedure checklist: Completed Consent: Before the procedure and under the influence of no sedative(s), amnesic(s), or anxiolytics, the patient was informed of the treatment options, risks and possible complications. To fulfill our ethical and legal obligations, as recommended by the American Medical Association's Code of Ethics, I have informed the patient of my clinical impression; the nature and purpose of the treatment or procedure; the risks, benefits, and possible complications of the intervention; the alternatives, including doing nothing; the risk(s) and benefit(s) of the alternative treatment(s) or procedure(s); and the risk(s) and benefit(s) of doing nothing. The patient was provided information about the general risks and possible complications associated with the procedure. These may include, but are not limited to: failure to achieve desired goals, infection, bleeding, organ or nerve damage, allergic  reactions, paralysis, and death. In addition, the patient was informed of those risks and complications associated to the procedure,  such as failure to decrease pain; infection; bleeding; organ or nerve damage with subsequent damage to sensory, motor, and/or autonomic systems, resulting in permanent pain, numbness, and/or weakness of one or several areas of the body; allergic reactions; (i.e.: anaphylactic reaction); and/or death. Furthermore, the patient was informed of those risks and complications associated with the medications. These include, but are not limited to: allergic reactions (i.e.: anaphylactic or anaphylactoid reaction(s)); adrenal axis suppression; blood sugar elevation that in diabetics may result in ketoacidosis or comma; water retention that in patients with history of congestive heart failure may result in shortness of breath, pulmonary edema, and decompensation with resultant heart failure; weight gain; swelling or edema; medication-induced neural toxicity; particulate matter embolism and blood vessel occlusion with resultant organ, and/or nervous system infarction; and/or aseptic necrosis of one or more joints. Finally, the patient was informed that Medicine is not an exact science; therefore, there is also the possibility of unforeseen or unpredictable risks and/or possible complications that may result in a catastrophic outcome. The patient indicated having understood very clearly. We have given the patient no guarantees and we have made no promises. Enough time was given to the patient to ask questions, all of which were answered to the patient's satisfaction. Brianna Arnold has indicated that she wanted to continue with the procedure. Attestation: I, the ordering provider, attest that I have discussed with the patient the benefits, risks, side-effects, alternatives, likelihood of achieving goals, and potential problems during recovery for the procedure that I have provided informed  consent. Date  Time: 03/29/2021  1:49 PM  Pre-Procedure Preparation:  Monitoring: As per clinic protocol. Respiration, ETCO2, SpO2, BP, heart rate and rhythm monitor placed and checked for adequate function Safety Precautions: Patient was assessed for positional comfort and pressure points before starting the procedure. Time-out: I initiated and conducted the "Time-out" before starting the procedure, as per protocol. The patient was asked to participate by confirming the accuracy of the "Time Out" information. Verification of the correct person, site, and procedure were performed and confirmed by me, the nursing staff, and the patient. "Time-out" conducted as per Joint Commission's Universal Protocol (UP.01.01.01). Time: 1410  Description of Procedure:          Area Prepped: Entire Lower Lumbosacral Region & Right cervical region DuraPrep (Iodine Povacrylex [0.7% available iodine] and Isopropyl Alcohol, 74% w/w) Safety Precautions: Aspiration looking for blood return was conducted prior to all injections. At no point did we inject any substances, as a needle was being advanced. No attempts were made at seeking any paresthesias. Safe injection practices and needle disposal techniques used. Medications properly checked for expiration dates. SDV (single dose vial) medications used. Description of the Procedure: Protocol guidelines were followed. The patient was placed in position over the fluoroscopy table. The target area was identified and the area prepped in the usual manner. Skin & deeper tissues infiltrated with local anesthetic. Appropriate amount of time allowed to pass for local anesthetics to take effect. The procedure needles were then advanced to the target area. Proper needle placement secured. Negative aspiration confirmed. Solution injected in intermittent fashion, asking for systemic symptoms every 0.5cc of injectate. The needles were then removed and the area cleansed, making sure to leave  some of the prepping solution back to take advantage of its long term bactericidal properties.  Vitals:   03/29/21 1353 03/29/21 1400 03/29/21 1410 03/29/21 1417  BP: 127/83 (!) 138/103 133/87 (!) 133/93  Pulse: 86     Resp: 16 18 18  16  Temp: (!) 97.1 F (36.2 C)     SpO2: 100% 100% 98% 100%  Weight: 149 lb (67.6 kg)         Start Time: 1410 hrs. End Time: 1416 hrs. Materials:  Needle(s) Type: Regular needle Gauge: 27G Length: 1.5-in  Medication(s): Please see orders for medications and dosing details.  3 trigger point injected in the left lower lumbar region with 0.5 to 1 cc of 0.2% ropivacaine with needling performed.  9 trigger points were also injected in the right cervical, right trapezius region with dry needling performed.  Each trigger point was injected with 0.5 to 1 cc of 0.2% ropivacaine.  Imaging Guidance:          Type of Imaging Technique: Fluoroscopy Guidance (Spinal) Indication(s): Assistance in needle guidance and placement for procedures requiring needle placement in or near specific anatomical locations not easily accessible without such assistance. Exposure Time: No patient exposure Contrast: None used. Fluoroscopic Guidance: I was personally present during the use of fluoroscopy. "Tunnel Vision Technique" used to obtain the best possible view of the target area. Parallax error corrected before commencing the procedure. "Direction-depth-direction" technique used to introduce the needle under continuous pulsed fluoroscopy. Once target was reached, antero-posterior, oblique, and lateral fluoroscopic projection used confirm needle placement in all planes. Images permanently stored in EMR. Ultrasound Guidance: N/A   Post-operative Assessment:  Post-procedure Vital Signs:  Pulse/HCG Rate: 8697 Temp: (!) 97.1 F (36.2 C) Resp: 16 BP: (!) 133/93 SpO2: 100 %  EBL: None  Complications: No immediate post-treatment complications observed by team, or reported  by patient.  Note: The patient tolerated the entire procedure well. A repeat set of vitals were taken after the procedure and the patient was kept under observation following institutional policy, for this type of procedure. Post-procedural neurological assessment was performed, showing return to baseline, prior to discharge. The patient was provided with post-procedure discharge instructions, including a section on how to identify potential problems. Should any problems arise concerning this procedure, the patient was given instructions to immediately contact us, at any time, without hesitation. In any case, we plan to contact the patient by telephone for a follow-up status report regarding this interventional procedure.  Comments:  No additional relevant information.  Plan of Care  Orders:  Orders Placed This Encounter  Procedures   DG PAIN [facility] C-ARM 1-60 MIN NO REPORT    Intraoperative interpretation by procedural physician at [facility].    Standing Status:   Standing    Number of Occurrences:   1    Order Specific Question:   Reason for exam:    Answer:   Assistance in needle guidance and placement for procedures requiring needle placement in or near specific anatomical locations not easily accessible without such assistance.       Medications ordered for procedure: Meds ordered this encounter  Medications   ropivacaine (PF) 2 mg/mL (0.2%) (NAROPIN) injection 9 mL   ropivacaine (PF) 2 mg/mL (0.2%) (NAROPIN) injection 9 mL   diazepam (VALIUM) tablet 5 mg    Make sure Flumazenil is available in the pyxis when using this medication. If oversedation occurs, administer 0.2 mg IV over 15 sec. If after 45 sec no response, administer 0.2 mg again over 1 min; may repeat at 1 min intervals; not to exceed 4 doses (1 mg)      Medications administered: We administered ropivacaine (PF) 2 mg/mL (0.2%), ropivacaine (PF) 2 mg/mL (0.2%), and diazepam.  See the medical record for  exact dosing, route, and time of administration.  Follow-up plan:   No follow-ups on file.       Pharmacological management options:  Opioid Analgesics: Butrans patch, side effects at 7.5 mcg an hour, analgesic benefit with no side effects at 5 mcg an hour, continue  Membrane stabilizer: Continue gabapentin as prescribed  Muscle relaxant: Continue Flexeril as prescribed  NSAID: To be determined at a later time  Other analgesic(s): To be determined at a later time    Interventional management options: Brianna Arnold was informed that there is no guarantee that she would be a candidate for interventional therapies. The decision will be based on the results of diagnostic studies, as well as Brianna Arnold's risk profile.  Procedure(s) hx Left axillary peripheral nerve stimulation: 12/30/19 providing significant pain relief.  Removed 02/29/2020              Recent Visits Date Type Provider Dept  03/08/21 Procedure visit Edward Jolly, MD [facility]  02/15/21 Procedure visit Edward Jolly, MD [facility]  01/25/21 Procedure visit Edward Jolly, MD [facility]  Showing recent visits within past 90 days and meeting all other requirements Today's Visits Date Type Provider Dept  03/29/21 Procedure visit Edward Jolly, MD [facility]  Showing today's visits and meeting all other requirements Future Appointments Date Type Provider Dept  04/13/21 Appointment Edward Jolly, MD [facility]  Showing future appointments within next 90 days and meeting all other requirements Disposition: Discharge home  Discharge (Date  Time): 03/29/2021; 1430 hrs.   Primary Care Physician: Marguarite Arbour, MD Location: [facility] Note by: Edward Jolly, MD Date: 03/29/2021; Time: 2:21 PM  Disclaimer:  Medicine is not an exact science. The only guarantee in medicine is that nothing is guaranteed. It is important to note that the decision  to proceed with this intervention was based on the information collected from the patient. The Data and conclusions were drawn from the patient's questionnaire, the interview, and the physical examination. Because the information was provided in large part by the patient, it cannot be guaranteed that it has not been purposely or unconsciously manipulated. Every effort has been made to obtain as much relevant data as possible for this evaluation. It is important to note that the conclusions that lead to this procedure are derived in large part from the available data. Always take into account that the treatment will also be dependent on availability of resources and existing treatment guidelines, considered by other Pain Management Practitioners as being common knowledge and practice, at the time of the intervention. For Medico-Legal purposes, it is also important to point out that variation in procedural techniques and pharmacological choices are the acceptable norm. The indications, contraindications, technique, and results of the above procedure should only be interpreted and judged by a Board-Certified Interventional Pain Specialist with extensive familiarity and expertise in the same exact procedure and technique.

## 2021-03-29 NOTE — Progress Notes (Signed)
Safety precautions to be maintained throughout the outpatient stay will include: orient to surroundings, keep bed in low position, maintain call bell within reach at all times, provide assistance with transfer out of bed and ambulation.  

## 2021-03-29 NOTE — Patient Instructions (Signed)

## 2021-03-30 ENCOUNTER — Telehealth: Payer: Self-pay | Admitting: *Deleted

## 2021-03-30 NOTE — Telephone Encounter (Signed)
Post procedure call, no questions or concerns.  

## 2021-04-13 ENCOUNTER — Encounter: Payer: Self-pay | Admitting: Student in an Organized Health Care Education/Training Program

## 2021-04-13 ENCOUNTER — Other Ambulatory Visit: Payer: Self-pay

## 2021-04-13 ENCOUNTER — Ambulatory Visit
Payer: Medicare HMO | Attending: Student in an Organized Health Care Education/Training Program | Admitting: Student in an Organized Health Care Education/Training Program

## 2021-04-13 DIAGNOSIS — Z981 Arthrodesis status: Secondary | ICD-10-CM | POA: Insufficient documentation

## 2021-04-13 DIAGNOSIS — M4105 Infantile idiopathic scoliosis, thoracolumbar region: Secondary | ICD-10-CM | POA: Diagnosis not present

## 2021-04-13 DIAGNOSIS — G894 Chronic pain syndrome: Secondary | ICD-10-CM | POA: Diagnosis not present

## 2021-04-13 DIAGNOSIS — M67912 Unspecified disorder of synovium and tendon, left shoulder: Secondary | ICD-10-CM | POA: Diagnosis not present

## 2021-04-13 MED ORDER — BUPRENORPHINE 5 MCG/HR TD PTWK: 1.0000 | MEDICATED_PATCH | TRANSDERMAL | 3 refills | Status: DC

## 2021-04-13 MED ORDER — GABAPENTIN 250 MG/5ML PO SOLN: 300.0000 mg | Freq: Three times a day (TID) | ORAL | 2 refills | Status: DC

## 2021-04-13 NOTE — Progress Notes (Signed)
PROVIDER NOTE: Information contained herein reflects review and annotations entered in association with encounter. Interpretation of such information and data should be left to medically-trained personnel. Information provided to patient can be located elsewhere in the medical record under "Patient Instructions". Document created using STT-dictation technology, any transcriptional errors that may result from process are unintentional.    Patient: Brianna Arnold  Service Category: E/M  Provider: Gillis Santa, MD  DOB: April 20, 1991  DOS: 04/13/2021  Specialty: Interventional Pain Management  MRN: [id number]  Setting: Ambulatory outpatient  PCP: Idelle Crouch, MD  Type: Established Patient    Referring Provider: Idelle Crouch, MD  Location: Office  Delivery: Face-to-face     HPI  [name], a 30 y.o. year old female, is here today because of her No primary diagnosis found.. [name] primary complain today is Back Pain and Hip Pain (bilateral)  Last encounter: My last encounter with her was on 12/15/20  (MM) Pertinent problems: Brianna Arnold has Status post cervical spinal fusion; Migraine without aura and without status migrainosus, not intractable; History of lumbar spinal fusion (T11- iliac); Cervicalgia; Chronic left shoulder pain; Disorder of left rotator cuff; and Chronic pain syndrome on their pertinent problem list. Pain Assessment: Severity of Chronic pain is reported as a 6 /10. Location: Back Lower/ . Onset: More than a month ago. Quality: Constant, Aching, Throbbing. Timing: Constant. Modifying factor(s): meds procedures. Vitals:  height is '4\' 7"'  (1.397 m) and weight is 149 lb (67.6 kg). Her temperature is 97.9 F (36.6 C). Her blood pressure is 114/78 and her pulse is 76. Her respiration is 18 and oxygen saturation is 99%.   Reason for encounter: both, medication management and post-procedure assessment.    Brianna Arnold presents today for medication refill  as well as postprocedural assessment from her previous TPI.  She receives TPI's every 3 to 4 weeks which are helpful for her myofascial pain syndrome, cervical, thoracic, lumbar. Refill Butrans patch as below.  No change in dose.    Post-procedure evaluation  TPI Effectiveness:  Initial hour after procedure: 100 %  Subsequent 4-6 hours post-procedure: 100 %  Analgesia past initial 6 hours: 75 % (ladting 2 weeks)   Patient continues with acupuncture which also helps  Pharmacotherapy Assessment  Analgesic: Butrans 5 mcg/hr q 7days   Monitoring:  PMP: PDMP reviewed during this encounter.       Pharmacotherapy: No side-effects or adverse reactions reported. Compliance: No problems identified. Effectiveness: Clinically acceptable.  UDS:  Summary  Date Value Ref Range Status  10/15/2019 Note  Final    Comment:    ==================================================================== Compliance Drug Analysis, Ur ==================================================================== Test                             Result       Flag       Units  Drug Absent but Declared for Prescription Verification   Buprenorphine                  Not Detected UNEXPECTED ng/mg creat    Transdermal buprenorphine, as indicated in the declared medication    list, is not always detected even when used as directed.    Tramadol                       Not Detected UNEXPECTED ng/mg creat   Gabapentin  Not Detected UNEXPECTED   Cyclobenzaprine                Not Detected UNEXPECTED   Trazodone                      Not Detected UNEXPECTED   Promethazine                   Not Detected UNEXPECTED ==================================================================== Test                      Result    Flag   Units      Ref Range   Creatinine              91               mg/dL      >=20 ==================================================================== Declared Medications:  The flagging  and interpretation on this report are based on the  following declared medications.  Unexpected results may arise from  inaccuracies in the declared medications.   **Note: The testing scope of this panel includes these medications:   Cyclobenzaprine (Flexeril)  Gabapentin (Neurontin)  Promethazine (Phenergan)  Tramadol (Ultram)  Trazodone (Desyrel)   **Note: The testing scope of this panel does not include small to  moderate amounts of these reported medications:   Buprenorphine Patch (BuTrans)   **Note: The testing scope of this panel does not include the  following reported medications:   Albuterol (Ventolin HFA)  Ethinyl Estradiol (Sprintec)  Famotidine (Pepcid)  Iron  Norgestimate (Sprintec)  Vitamin B12  Vitamin D3 ==================================================================== For clinical consultation, please call [phone]. ====================================================================       ROS  Constitutional: Denies any fever or chills Gastrointestinal: No reported hemesis, hematochezia, vomiting, or acute GI distress Musculoskeletal:  Left shoulder pain, bilateral low back pain Neurological: No reported episodes of acute onset apraxia, aphasia, dysarthria, agnosia, amnesia, paralysis, loss of coordination, or loss of consciousness  Medication Review  Vitamin D3, albuterol, baclofen, buprenorphine, cyclobenzaprine, ergocalciferol, famotidine, ferrous sulfate, gabapentin, medroxyPROGESTERone, ofloxacin, promethazine, propranolol ER, traMADol, traZODone, and vitamin B-12  History Review  Allergy: Brianna Arnold is allergic to hypafix [wound dressings], ativan [lorazepam], and tizanidine. Drug: Brianna Arnold  reports no history of drug use. Alcohol:  has no history on file for alcohol use. Tobacco:  reports that she has never smoked. She has never used smokeless tobacco. Social: Brianna Arnold  reports that she has never smoked. She has never used  smokeless tobacco. She reports that she does not use drugs. Medical:  has a past medical history of Anemia and Jarcho-Levin syndrome. Surgical: Brianna Arnold  has a past surgical history that includes Cervical spine surgery (07/2014); Back surgery (10/2014); and Anterior cervical decomp/discectomy fusion (N/A, 02/11/2019). Family: family history includes Hypertension in her father.  Laboratory Chemistry Profile   Renal Lab Results  Component Value Date   BUN 6 02/15/2019   CREATININE 0.63 02/15/2019   GFRAA >60 02/15/2019   GFRNONAA >60 02/15/2019     Hepatic Lab Results  Component Value Date   AST 17 02/14/2019   ALT 12 02/14/2019   ALBUMIN 3.9 02/14/2019   ALKPHOS 55 02/14/2019   LIPASE 18 02/14/2019     Electrolytes Lab Results  Component Value Date   NA 137 02/15/2019   K 4.1 02/15/2019   CL 106 02/15/2019   CALCIUM 8.1 (L) 02/15/2019     Bone No results found for: [place], [id number], [id number], [id number], [place],  25OHVITD2, 25OHVITD3, TESTOFREE, TESTOSTERONE   Inflammation (CRP: Acute Phase) (ESR: Chronic Phase) Lab Results  Component Value Date   LATICACIDVEN 1.6 04/19/2015       Note: Above Lab results reviewed.  Physical Exam  General appearance: Well nourished, well developed, and well hydrated. In no apparent acute distress Mental status: Alert, oriented x 3 (person, place, & time)       Respiratory: No evidence of acute respiratory distress Eyes: [name] Vitals: BP 114/78    Pulse 76    Temp 97.9 F (36.6 C)    Resp 18    Ht '4\' 7"'  (1.397 m)    Wt 149 lb (67.6 kg)    SpO2 99%    BMI 34.63 kg/m  BMI: Estimated body mass index is 34.63 kg/m as calculated from the following:   Height as of this encounter: '4\' 7"'  (1.397 m).   Weight as of this encounter: 149 lb (67.6 kg). Ideal: Patient must be at least 60 in tall to calculate ideal body weight  Cervical Spine Exam  Skin & Axial Inspection: Well healed scar from previous spine surgery  detected Alignment: Asymmetric Functional ROM: Pain restricted ROM      Stability: No instability detected Muscle Tone/Strength: Functionally intact. No obvious neuro-muscular anomalies detected. Sensory (Neurological): Neuropathic pain pattern Palpation: No palpable anomalies                          Upper Extremity (UE) Exam      Side: Right upper extremity   Side: Left upper extremity    Skin & Extremity Inspection: Skin color, temperature, and hair growth are WNL. No peripheral edema or cyanosis. No masses, redness, swelling, asymmetry, or associated skin lesions. No contractures.   Skin & Extremity Inspection:  Axillary nerve peripheral nerve stimulator in place, insertion site not erythematous, nonindurated.    Functional ROM: Unrestricted ROM           Functional ROM: Decreased ROM for all joints of upper extremity however improved since axillary nerve peripheral nerve stimulation    Muscle Tone/Strength: Functionally intact. No obvious neuro-muscular anomalies detected.   Muscle Tone/Strength: Functionally intact. No obvious neuro-muscular anomalies detected.    Sensory (Neurological): Dermatomal pain pattern           Sensory (Neurological): Dermatomal pain pattern            Palpation: No palpable anomalies               Palpation: No palpable anomalies                Provocative Test(s):  Phalen's test: deferred Tinel's test: deferred Apley's scratch test (touch opposite shoulder):  Action 1 (Across chest): deferred Action 2 (Overhead): deferred Action 3 (LB reach): deferred     Provocative Test(s):  Phalen's test: deferred Tinel's test: deferred Apley's scratch test (touch opposite shoulder):  Action 1 (Across chest): Decreased ROM Action 2 (Overhead): Decreased ROM Action 3 (LB reach): Decreased ROM        Thoracic Spine Area Exam  Skin & Axial Inspection: Well healed scar from previous spine surgery detected Alignment: asymmetrical Functional ROM: Mechanically  restricted ROM Stability: No instability detected Muscle Tone/Strength: Functionally intact. No obvious neuro-muscular anomalies detected. Sensory (Neurological): Neurogenic pain pattern Muscle strength & Tone: No palpable anomalies   Lumbar Exam  Skin & Axial Inspection: Well healed scar from previous spine surgery detected Alignment: Scoliosis detected Functional ROM: Pain restricted ROM  Stability: No instability detected Muscle Tone/Strength: Functionally intact. No obvious neuro-muscular anomalies detected. Sensory (Neurological):  Musculoskeletal pain pattern Palpation: Complains of area being tender to palpation         Gait & Posture Assessment  Ambulation: Limited Gait: Antalgic Posture: Difficulty standing up straight, due to pain    Lower Extremity Exam      Side: Right lower extremity   Side: Left lower extremity  Stability: No instability observed           Stability: No instability observed          Skin & Extremity Inspection: Skin color, temperature, and hair growth are WNL. No peripheral edema or cyanosis. No masses, redness, swelling, asymmetry, or associated skin lesions. No contractures.   Skin & Extremity Inspection: Skin color, temperature, and hair growth are WNL. No peripheral edema or cyanosis. No masses, redness, swelling, asymmetry, or associated skin lesions. No contractures.  Functional ROM: Pain restricted ROM                   Functional ROM: Pain restricted ROM                  Muscle Tone/Strength: Functionally intact. No obvious neuro-muscular anomalies detected.   Muscle Tone/Strength: Functionally intact. No obvious neuro-muscular anomalies detected.  Sensory (Neurological): Unimpaired         Sensory (Neurological): Unimpaired        DTR: Patellar: deferred today Achilles: deferred today Plantar: deferred today   DTR: Patellar: deferred today Achilles: deferred today Plantar: deferred today  Palpation: No palpable anomalies   Palpation: No  palpable anomalies     Assessment   Status Diagnosis  Persistent Persistent Persistent 1. Infantile idiopathic scoliosis of thoracolumbar region   2. Status post cervical spinal fusion   3. Disorder of left rotator cuff   4. Chronic pain syndrome        Plan of Care  Brianna Arnold has a current medication list which includes the following long-term medication(s): albuterol, famotidine, ferrous sulfate, medroxyprogesterone, promethazine, propranolol er, trazodone, and gabapentin.  Pharmacotherapy (Medications Ordered): Meds ordered this encounter  Medications   buprenorphine (BUTRANS) 5 MCG/HR PTWK    Sig: Place 1 patch onto the skin once a week.    Dispense:  4 patch    Refill:  3    For chronic pain syndrome   gabapentin (NEURONTIN) 250 MG/5ML solution    Sig: Take 6 mLs (300 mg total) by mouth 3 (three) times daily.    Dispense:  470 mL    Refill:  2   2 weeks for TPI    Follow-up plan:   Return in about 3 months (around 07/12/2021) for Medication Management, in person.      Pharmacological management options:  Opioid Analgesics: Butrans patch, side effects at 7.5 mcg an hour, analgesic benefit with no side effects at 5 mcg an hour, continue  Membrane stabilizer: Continue gabapentin as prescribed  Muscle relaxant: Continue Flexeril as prescribed  NSAID: To be determined at a later time  Other analgesic(s): To be determined at a later time             Recent Visits Date Type Provider Dept  03/29/21 Procedure visit Gillis Santa, MD [facility]  03/08/21 Procedure visit Gillis Santa, MD [facility]  02/15/21 Procedure visit Gillis Santa, MD [facility]  01/25/21 Procedure visit Gillis Santa, MD [facility]  Showing recent visits within past  90 days and meeting all other requirements Today's Visits Date Type Provider Dept  04/13/21 Office Visit Gillis Santa, MD [facility]  Showing today's  visits and meeting all other requirements Future Appointments Date Type Provider Dept  04/26/21 Appointment Gillis Santa, MD [facility]  Showing future appointments within next 90 days and meeting all other requirements  I discussed the assessment and treatment plan with the patient. The patient was provided an opportunity to ask questions and all were answered. The patient agreed with the plan and demonstrated an understanding of the instructions.  Patient advised to call back or seek an in-person evaluation if the symptoms or condition worsens.  Duration of encounter: 45mnutes.  Note by: BGillis Santa MD Date: 04/13/2021; Time: 8:57 AM

## 2021-04-13 NOTE — Progress Notes (Signed)
Nursing Pain Medication Assessment:  Safety precautions to be maintained throughout the outpatient stay will include: orient to surroundings, keep bed in low position, maintain call bell within reach at all times, provide assistance with transfer out of bed and ambulation.  Medication Inspection Compliance: Pill count conducted under aseptic conditions, in front of the patient. Neither the pills nor the bottle was removed from the patient's sight at any time. Once count was completed pills were immediately returned to the patient in their original bottle.  Medication: Buprenorphine (Suboxone) Pill/Patch Count:  0 of 4 pills remain Pill/Patch Appearance: Markings consistent with prescribed medication Bottle Appearance: Standard pharmacy container. Clearly labeled. Filled Date: 45 / 15 / 2022 Last Medication intake:  Today

## 2021-04-26 ENCOUNTER — Other Ambulatory Visit: Payer: Self-pay

## 2021-04-26 ENCOUNTER — Encounter: Payer: Self-pay | Admitting: Student in an Organized Health Care Education/Training Program

## 2021-04-26 ENCOUNTER — Ambulatory Visit
Admission: RE | Admit: 2021-04-26 | Discharge: 2021-04-26 | Disposition: A | Discharge status: Home / Self Care | Payer: Medicare HMO | Source: Ambulatory Visit | Attending: Student in an Organized Health Care Education/Training Program | Admitting: Student in an Organized Health Care Education/Training Program

## 2021-04-26 ENCOUNTER — Ambulatory Visit (HOSPITAL_BASED_OUTPATIENT_CLINIC_OR_DEPARTMENT_OTHER): Payer: Medicare HMO | Admitting: Student in an Organized Health Care Education/Training Program

## 2021-04-26 VITALS — BP 123/89 | HR 98 | Temp 97.8°F | Resp 18 | Ht <= 58 in | Wt 148.0 lb

## 2021-04-26 DIAGNOSIS — M7918 Myalgia, other site: Secondary | ICD-10-CM

## 2021-04-26 DIAGNOSIS — G894 Chronic pain syndrome: Secondary | ICD-10-CM

## 2021-04-26 DIAGNOSIS — M4105 Infantile idiopathic scoliosis, thoracolumbar region: Secondary | ICD-10-CM

## 2021-04-26 MED ORDER — ROPIVACAINE HCL 2 MG/ML IJ SOLN
9.0000 mL | Freq: Once | INTRAMUSCULAR | Status: AC
  Administered 2021-04-26: 20 mL via PERINEURAL

## 2021-04-26 MED ORDER — ROPIVACAINE HCL 2 MG/ML IJ SOLN
INTRAMUSCULAR | Status: AC
  Filled 2021-04-26: qty 20

## 2021-04-26 NOTE — Progress Notes (Signed)
Safety precautions to be maintained throughout the outpatient stay will include: orient to surroundings, keep bed in low position, maintain call bell within reach at all times, provide assistance with transfer out of bed and ambulation.  

## 2021-04-26 NOTE — Patient Instructions (Signed)

## 2021-04-26 NOTE — Progress Notes (Signed)
PROVIDER NOTE: Information contained herein reflects review and annotations entered in association with encounter. Interpretation of such information and data should be left to medically-trained personnel. Information provided to patient can be located elsewhere in the medical record under "Patient Instructions". Document created using STT-dictation technology, any transcriptional errors that may result from process are unintentional.    Patient: Brianna Arnold  Service Category: Procedure  Provider: Edward Jolly, MD  DOB: 26-Dec-1990  DOS: 04/26/2021  Location: [facility]  MRN: [id number]  Setting: Ambulatory - outpatient  Referring Provider: Marguarite Arbour, MD  Type: Established Patient  Specialty: Interventional Pain Management  PCP: Marguarite Arbour, MD   Primary Reason for Visit: Interventional Pain Management Treatment. CC: Neck Pain (left) and Back Pain (Left and mid)   Procedure:          Anesthesia, Analgesia, Anxiolysis:  Type: Lumbar Paraspinal Muscle Trigger Point Injection (3+)         Quadratus lumborum, erector spinae, multifidus and periscapular region and bilateral cervical/trapezius TPI CPT: 20553 with fluoroscopy Primary Purpose: Therapeutic Approach: Percutaneous, ipsilateral approach. Laterality: Midline        Type: Local Anesthesia   Position: Prone   Indications: 1. Infantile idiopathic scoliosis of thoracolumbar region   2. Myofascial pain syndrome of lumbar spine   3. Chronic pain syndrome       Pain Score: Pre-procedure: 7 /10 Post-procedure: 7 /10   Pre-op H&P Assessment:  Brianna Arnold is a 31 y.o. (year old), female patient, seen today for interventional treatment. She  has a past surgical history that includes Cervical spine surgery (07/2014); Back surgery (10/2014); and Anterior cervical decomp/discectomy fusion (N/A, 02/11/2019). Brianna Arnold has a current medication list which includes the following prescription(s): albuterol,  baclofen, buprenorphine, vitamin d3, cyclobenzaprine, ergocalciferol, famotidine, ferrous sulfate, gabapentin, medroxyprogesterone, ofloxacin, promethazine, propranolol er, tramadol, trazodone, and vitamin b-12. Her primarily concern today is the Neck Pain (left) and Back Pain (Left and mid)   Initial Vital Signs:  Pulse/HCG Rate: 98  Temp: 97.8 F (36.6 C) Resp: 18 BP: 123/89 SpO2: 100 %  BMI: Estimated body mass index is 34.4 kg/m as calculated from the following:   Height as of this encounter: 4\' 7"  (1.397 m).   Weight as of this encounter: 148 lb (67.1 kg).  Risk Assessment: Allergies: Reviewed. She is allergic to hypafix [wound dressings], ativan [lorazepam], and tizanidine.  Allergy Precautions: None required Coagulopathies: Reviewed. None identified.  Blood-thinner therapy: None at this time Active Infection(s): Reviewed. None identified. Brianna Arnold is afebrile  Site Confirmation: Brianna Arnold was asked to confirm the procedure and laterality before marking the site Procedure checklist: Completed Consent: Before the procedure and under the influence of no sedative(s), amnesic(s), or anxiolytics, the patient was informed of the treatment options, risks and possible complications. To fulfill our ethical and legal obligations, as recommended by the American Medical Association's Code of Ethics, I have informed the patient of my clinical impression; the nature and purpose of the treatment or procedure; the risks, benefits, and possible complications of the intervention; the alternatives, including doing nothing; the risk(s) and benefit(s) of the alternative treatment(s) or procedure(s); and the risk(s) and benefit(s) of doing nothing. The patient was provided information about the general risks and possible complications associated with the procedure. These may include, but are not limited to: failure to achieve desired goals, infection, bleeding, organ or nerve damage, allergic reactions,  paralysis, and death. In addition, the patient was informed of those risks and  complications associated to the procedure, such as failure to decrease pain; infection; bleeding; organ or nerve damage with subsequent damage to sensory, motor, and/or autonomic systems, resulting in permanent pain, numbness, and/or weakness of one or several areas of the body; allergic reactions; (i.e.: anaphylactic reaction); and/or death. Furthermore, the patient was informed of those risks and complications associated with the medications. These include, but are not limited to: allergic reactions (i.e.: anaphylactic or anaphylactoid reaction(s)); adrenal axis suppression; blood sugar elevation that in diabetics may result in ketoacidosis or comma; water retention that in patients with history of congestive heart failure may result in shortness of breath, pulmonary edema, and decompensation with resultant heart failure; weight gain; swelling or edema; medication-induced neural toxicity; particulate matter embolism and blood vessel occlusion with resultant organ, and/or nervous system infarction; and/or aseptic necrosis of one or more joints. Finally, the patient was informed that Medicine is not an exact science; therefore, there is also the possibility of unforeseen or unpredictable risks and/or possible complications that may result in a catastrophic outcome. The patient indicated having understood very clearly. We have given the patient no guarantees and we have made no promises. Enough time was given to the patient to ask questions, all of which were answered to the patient's satisfaction. Brianna Arnold has indicated that she wanted to continue with the procedure. Attestation: I, the ordering provider, attest that I have discussed with the patient the benefits, risks, side-effects, alternatives, likelihood of achieving goals, and potential problems during recovery for the procedure that I have provided informed consent. Date    Time: 04/26/2021  2:32 PM  Pre-Procedure Preparation:  Monitoring: As per clinic protocol. Respiration, ETCO2, SpO2, BP, heart rate and rhythm monitor placed and checked for adequate function Safety Precautions: Patient was assessed for positional comfort and pressure points before starting the procedure. Time-out: I initiated and conducted the "Time-out" before starting the procedure, as per protocol. The patient was asked to participate by confirming the accuracy of the "Time Out" information. Verification of the correct person, site, and procedure were performed and confirmed by me, the nursing staff, and the patient. "Time-out" conducted as per Joint Commission's Universal Protocol (UP.01.01.01). Time: 1447  Description of Procedure:          Area Prepped: Entire Lower Lumbosacral Region & Right cervical region DuraPrep (Iodine Povacrylex [0.7% available iodine] and Isopropyl Alcohol, 74% w/w) Safety Precautions: Aspiration looking for blood return was conducted prior to all injections. At no point did we inject any substances, as a needle was being advanced. No attempts were made at seeking any paresthesias. Safe injection practices and needle disposal techniques used. Medications properly checked for expiration dates. SDV (single dose vial) medications used. Description of the Procedure: Protocol guidelines were followed. The patient was placed in position over the fluoroscopy table. The target area was identified and the area prepped in the usual manner. Skin & deeper tissues infiltrated with local anesthetic. Appropriate amount of time allowed to pass for local anesthetics to take effect. The procedure needles were then advanced to the target area. Proper needle placement secured. Negative aspiration confirmed. Solution injected in intermittent fashion, asking for systemic symptoms every 0.5cc of injectate. The needles were then removed and the area cleansed, making sure to leave some of the  prepping solution back to take advantage of its long term bactericidal properties.  Vitals:   04/26/21 1436  BP: 123/89  Pulse: 98  Resp: 18  Temp: 97.8 F (36.6 C)  TempSrc: Temporal  SpO2:  100%  Weight: 148 lb (67.1 kg)  Height: 4\' 7"  (1.397 m)      Start Time: 1447 hrs. End Time: 1453 hrs. Materials:  Needle(s) Type: Regular needle Gauge: 27G Length: 1.5-in  Medication(s): Please see orders for medications and dosing details.  3 trigger point injected in the left lower lumbar region with 0.5 to 1 cc of 0.2% ropivacaine with needling performed.  8 trigger points were also injected in the right cervical, right trapezius region with dry needling performed.  Each trigger point was injected with 0.5 to 1 cc of 0.2% ropivacaine.  Imaging Guidance:          Type of Imaging Technique: Fluoroscopy Guidance (Spinal) Indication(s): Assistance in needle guidance and placement for procedures requiring needle placement in or near specific anatomical locations not easily accessible without such assistance. Exposure Time: No patient exposure Contrast: None used. Fluoroscopic Guidance: I was personally present during the use of fluoroscopy. "Tunnel Vision Technique" used to obtain the best possible view of the target area. Parallax error corrected before commencing the procedure. "Direction-depth-direction" technique used to introduce the needle under continuous pulsed fluoroscopy. Once target was reached, antero-posterior, oblique, and lateral fluoroscopic projection used confirm needle placement in all planes. Images permanently stored in EMR. Ultrasound Guidance: N/A   Post-operative Assessment:  Post-procedure Vital Signs:  Pulse/HCG Rate: 98  Temp: 97.8 F (36.6 C) Resp: 18 BP: 123/89 SpO2: 100 %  EBL: None  Complications: No immediate post-treatment complications observed by team, or reported by patient.  Note: The patient tolerated the entire procedure well. A repeat set  of vitals were taken after the procedure and the patient was kept under observation following institutional policy, for this type of procedure. Post-procedural neurological assessment was performed, showing return to baseline, prior to discharge. The patient was provided with post-procedure discharge instructions, including a section on how to identify potential problems. Should any problems arise concerning this procedure, the patient was given instructions to immediately contact us, at any time, without hesitation. In any case, we plan to contact the patient by telephone for a follow-up status report regarding this interventional procedure.  Comments:  No additional relevant information.  Plan of Care  Orders:  Orders Placed This Encounter  Procedures   DG PAIN CL[facility] C-ARM 1-60 MIN NO REPORT    Intraoperative interpretation by procedural physician at [facility].    Standing Status:   Standing    Number of Occurrences:   1    Order Specific Question:   Reason for exam:    Answer:   Assistance in needle guidance and placement for procedures requiring needle placement in or near specific anatomical locations not easily accessible without such assistance.       Medications ordered for procedure: Meds ordered this encounter  Medications   ropivacaine (PF) 2 mg/mL (0.2%) (NAROPIN) injection 9 mL   ropivacaine (PF) 2 mg/mL (0.2%) (NAROPIN) injection 9 mL      Medications administered: We administered ropivacaine (PF) 2 mg/mL (0.2%) and ropivacaine (PF) 2 mg/mL (0.2%).  See the medical record for exact dosing, route, and time of administration.  Follow-up plan:   Return for Keep sch. appt.       Pharmacological management options:  Opioid Analgesics: Butrans patch, side effects at 7.5 mcg an hour, analgesic benefit with no side effects at 5 mcg an hour, continue  Membrane stabilizer: Continue gabapentin as prescribed  Muscle relaxant: Continue Flexeril as prescribed   NSAID: To be determined at a  later time  Other analgesic(s): To be determined at a later time    Interventional management options: Brianna Arnold was informed that there is no guarantee that she would be a candidate for interventional therapies. The decision will be based on the results of diagnostic studies, as well as Brianna Arnold risk profile.  Procedure(s) hx Left axillary peripheral nerve stimulation: 12/30/19 providing significant pain relief.  Removed 02/29/2020              Recent Visits Date Type Provider Dept  04/13/21 Office Visit Edward JollyLateef, Cyera Balboni, MD [facility]  03/29/21 Procedure visit Edward JollyLateef, Meris Reede, MD [facility]  03/08/21 Procedure visit Edward JollyLateef, Jaymes Revels, MD [facility]  02/15/21 Procedure visit Edward JollyLateef, Kissy Cielo, MD [facility]  Showing recent visits within past 90 days and meeting all other requirements Today's Visits Date Type Provider Dept  04/26/21 Procedure visit Edward JollyLateef, Cambria Osten, MD [facility]  Showing today's visits and meeting all other requirements Future Appointments Date Type Provider Dept  07/20/21 Appointment Edward JollyLateef, Araseli Sherry, MD [facility]  Showing future appointments within next 90 days and meeting all other requirements  Disposition: Discharge home  Discharge (Date   Time): 04/26/2021;   hrs.   Primary Care Physician: Marguarite ArbourSparks, Jeffrey D, MD [facility] Note by: Edward JollyBilal Sukanya Goldblatt, MD Date: 04/26/2021; Time: 2:58 PM  Disclaimer:  Medicine is not an exact science. The only guarantee in medicine is that nothing is guaranteed. It is important to note that the decision to proceed with this intervention was based on the information collected from the patient. The Data and conclusions were drawn from the patient's questionnaire, the interview, and the physical examination. Because the information was provided in large part by the patient, it cannot be guaranteed that it has not been  purposely or unconsciously manipulated. Every effort has been made to obtain as much relevant data as possible for this evaluation. It is important to note that the conclusions that lead to this procedure are derived in large part from the available data. Always take into account that the treatment will also be dependent on availability of resources and existing treatment guidelines, considered by other Pain Management Practitioners as being common knowledge and practice, at the time of the intervention. For Medico-Legal purposes, it is also important to point out that variation in procedural techniques and pharmacological choices are the acceptable norm. The indications, contraindications, technique, and results of the above procedure should only be interpreted and judged by a Board-Certified Interventional Pain Specialist with extensive familiarity and expertise in the same exact procedure and technique.

## 2021-05-24 ENCOUNTER — Other Ambulatory Visit: Payer: Self-pay

## 2021-05-24 ENCOUNTER — Ambulatory Visit (HOSPITAL_BASED_OUTPATIENT_CLINIC_OR_DEPARTMENT_OTHER): Payer: Medicare HMO | Admitting: Student in an Organized Health Care Education/Training Program

## 2021-05-24 ENCOUNTER — Encounter: Payer: Self-pay | Admitting: Student in an Organized Health Care Education/Training Program

## 2021-05-24 ENCOUNTER — Ambulatory Visit
Admission: RE | Admit: 2021-05-24 | Discharge: 2021-05-24 | Disposition: A | Discharge status: Home / Self Care | Payer: Medicare HMO | Source: Ambulatory Visit | Attending: Student in an Organized Health Care Education/Training Program | Admitting: Student in an Organized Health Care Education/Training Program

## 2021-05-24 VITALS — BP 123/82 | HR 85 | Temp 97.9°F | Resp 18 | Ht <= 58 in | Wt 148.0 lb

## 2021-05-24 DIAGNOSIS — G894 Chronic pain syndrome: Secondary | ICD-10-CM

## 2021-05-24 DIAGNOSIS — M4105 Infantile idiopathic scoliosis, thoracolumbar region: Secondary | ICD-10-CM

## 2021-05-24 DIAGNOSIS — M7918 Myalgia, other site: Secondary | ICD-10-CM | POA: Insufficient documentation

## 2021-05-24 MED ORDER — ROPIVACAINE HCL 2 MG/ML IJ SOLN: 9.0000 mL | Freq: Once | INTRAMUSCULAR | Status: DC

## 2021-05-24 NOTE — Progress Notes (Signed)
Safety precautions to be maintained throughout the outpatient stay will include: orient to surroundings, keep bed in low position, maintain call bell within reach at all times, provide assistance with transfer out of bed and ambulation.  

## 2021-05-24 NOTE — Progress Notes (Signed)
PROVIDER NOTE: Information contained herein reflects review and annotations entered in association with encounter. Interpretation of such information and data should be left to medically-trained personnel. Information provided to patient can be located elsewhere in the medical record under "Patient Instructions". Document created using STT-dictation technology, any transcriptional errors that may result from process are unintentional.    Patient: Brianna Arnold  Service Category: Procedure  Provider: Edward JollyBilal Miloh Alcocer, MD  DOB: Jun 04, 1990  DOS: 05/24/2021  Location: [facility]  MRN: [id number]  Setting: Ambulatory - outpatient  Referring Provider: Marguarite ArbourSparks, Jeffrey D, MD  Type: Established Patient  Specialty: Interventional Pain Management  PCP: Marguarite ArbourSparks, Jeffrey D, MD   Primary Reason for Visit: Interventional Pain Management Treatment. CC: Neck Pain (L>R)   Procedure:          Anesthesia, Analgesia, Anxiolysis:  Type: Lumbar Paraspinal Muscle Trigger Point Injection (3+)         Quadratus lumborum, erector spinae, multifidus and periscapular region and bilateral cervical/trapezius TPI CPT: 20553 with fluoroscopy Primary Purpose: Therapeutic Approach: Percutaneous, ipsilateral approach. Laterality: Midline        Type: Local Anesthesia   Position: Prone   Indications: 1. Infantile idiopathic scoliosis of thoracolumbar region   2. Myofascial pain syndrome of lumbar spine   3. Chronic pain syndrome       Pain Score: Pre-procedure: 7  (7.5)/10 Post-procedure: 6  (walking/moving)/10   Pre-op H&P Assessment:  Brianna Arnold is a 31 y.o. (year old), female patient, seen today for interventional treatment. She  has a past surgical history that includes Cervical spine surgery (07/2014); Back surgery (10/2014); and Anterior cervical decomp/discectomy fusion (N/A, 02/11/2019). Brianna Arnold has a current medication list which includes the following prescription(s): albuterol,  baclofen, buprenorphine, vitamin d3, cyclobenzaprine, ergocalciferol, famotidine, ferrous sulfate, gabapentin, medroxyprogesterone, ofloxacin, promethazine, propranolol er, tramadol, trazodone, and vitamin b-12, and the following Facility-Administered Medications: ropivacaine (pf) 2 mg/ml (0.2%) and ropivacaine (pf) 2 mg/ml (0.2%). Her primarily concern today is the Neck Pain (L>R)   Initial Vital Signs:  Pulse/HCG Rate: 85  Temp: 97.9 F (36.6 C) Resp: 18 BP: 123/82 SpO2: 99 %  BMI: Estimated body mass index is 33.78 kg/m as calculated from the following:   Height as of this encounter: 4' 7.5" (1.41 m).   Weight as of this encounter: 148 lb (67.1 kg).  Risk Assessment: Allergies: Reviewed. She is allergic to hypafix [wound dressings], ativan [lorazepam], and tizanidine.  Allergy Precautions: None required Coagulopathies: Reviewed. None identified.  Blood-thinner therapy: None at this time Active Infection(s): Reviewed. None identified. Brianna Arnold is afebrile  Site Confirmation: Brianna Arnold was asked to confirm the procedure and laterality before marking the site Procedure checklist: Completed Consent: Before the procedure and under the influence of no sedative(s), amnesic(s), or anxiolytics, the patient was informed of the treatment options, risks and possible complications. To fulfill our ethical and legal obligations, as recommended by the American Medical Association's Code of Ethics, I have informed the patient of my clinical impression; the nature and purpose of the treatment or procedure; the risks, benefits, and possible complications of the intervention; the alternatives, including doing nothing; the risk(s) and benefit(s) of the alternative treatment(s) or procedure(s); and the risk(s) and benefit(s) of doing nothing. The patient was provided information about the general risks and possible complications associated with the procedure. These may include, but are not limited to:  failure to achieve desired goals, infection, bleeding, organ or nerve damage, allergic reactions, paralysis, and death. In addition, the patient  was informed of those risks and complications associated to the procedure, such as failure to decrease pain; infection; bleeding; organ or nerve damage with subsequent damage to sensory, motor, and/or autonomic systems, resulting in permanent pain, numbness, and/or weakness of one or several areas of the body; allergic reactions; (i.e.: anaphylactic reaction); and/or death. Furthermore, the patient was informed of those risks and complications associated with the medications. These include, but are not limited to: allergic reactions (i.e.: anaphylactic or anaphylactoid reaction(s)); adrenal axis suppression; blood sugar elevation that in diabetics may result in ketoacidosis or comma; water retention that in patients with history of congestive heart failure may result in shortness of breath, pulmonary edema, and decompensation with resultant heart failure; weight gain; swelling or edema; medication-induced neural toxicity; particulate matter embolism and blood vessel occlusion with resultant organ, and/or nervous system infarction; and/or aseptic necrosis of one or more joints. Finally, the patient was informed that Medicine is not an exact science; therefore, there is also the possibility of unforeseen or unpredictable risks and/or possible complications that may result in a catastrophic outcome. The patient indicated having understood very clearly. We have given the patient no guarantees and we have made no promises. Enough time was given to the patient to ask questions, all of which were answered to the patient's satisfaction. Brianna Arnold has indicated that she wanted to continue with the procedure. Attestation: I, the ordering provider, attest that I have discussed with the patient the benefits, risks, side-effects, alternatives, likelihood of achieving goals, and  potential problems during recovery for the procedure that I have provided informed consent. Date   Time: 05/24/2021  1:56 PM  Pre-Procedure Preparation:  Monitoring: As per clinic protocol. Respiration, ETCO2, SpO2, BP, heart rate and rhythm monitor placed and checked for adequate function Safety Precautions: Patient was assessed for positional comfort and pressure points before starting the procedure. Time-out: I initiated and conducted the "Time-out" before starting the procedure, as per protocol. The patient was asked to participate by confirming the accuracy of the "Time Out" information. Verification of the correct person, site, and procedure were performed and confirmed by me, the nursing staff, and the patient. "Time-out" conducted as per Joint Commission's Universal Protocol (UP.01.01.01). Time: 1447  Description of Procedure:          Area Prepped: Entire Lower Lumbosacral Region & Right cervical region DuraPrep (Iodine Povacrylex [0.7% available iodine] and Isopropyl Alcohol, 74% w/w) Safety Precautions: Aspiration looking for blood return was conducted prior to all injections. At no point did we inject any substances, as a needle was being advanced. No attempts were made at seeking any paresthesias. Safe injection practices and needle disposal techniques used. Medications properly checked for expiration dates. SDV (single dose vial) medications used. Description of the Procedure: Protocol guidelines were followed. The patient was placed in position over the fluoroscopy table. The target area was identified and the area prepped in the usual manner. Skin & deeper tissues infiltrated with local anesthetic. Appropriate amount of time allowed to pass for local anesthetics to take effect. The procedure needles were then advanced to the target area. Proper needle placement secured. Negative aspiration confirmed. Solution injected in intermittent fashion, asking for systemic symptoms every 0.5cc of  injectate. The needles were then removed and the area cleansed, making sure to leave some of the prepping solution back to take advantage of its long term bactericidal properties.  Vitals:   05/24/21 1400  BP: 123/82  Pulse: 85  Resp: 18  Temp: 97.9 F (36.6  C)  TempSrc: Temporal  SpO2: 99%  Weight: 148 lb (67.1 kg)  Height: 4' 7.5" (1.41 m)      Start Time: 1447 hrs. End Time: 1455 hrs. Materials:  Needle(s) Type: Regular needle Gauge: 27G Length: 1.5-in  Medication(s): Please see orders for medications and dosing details.   12 trigger points were also injected in the right and left cervical, right and left trapezius region with dry needling performed.  Each trigger point was injected with 0.5 to 1 cc of 0.2% ropivacaine.  Imaging Guidance:          Type of Imaging Technique: Fluoroscopy Guidance (Spinal) Indication(s): Assistance in needle guidance and placement for procedures requiring needle placement in or near specific anatomical locations not easily accessible without such assistance. Exposure Time: No patient exposure Contrast: None used. Fluoroscopic Guidance: I was personally present during the use of fluoroscopy. "Tunnel Vision Technique" used to obtain the best possible view of the target area. Parallax error corrected before commencing the procedure. "Direction-depth-direction" technique used to introduce the needle under continuous pulsed fluoroscopy. Once target was reached, antero-posterior, oblique, and lateral fluoroscopic projection used confirm needle placement in all planes. Images permanently stored in EMR. Ultrasound Guidance: N/A   Post-operative Assessment:  Post-procedure Vital Signs:  Pulse/HCG Rate: 85  Temp: 97.9 F (36.6 C) Resp: 18 BP: 123/82 SpO2: 99 %  EBL: None  Complications: No immediate post-treatment complications observed by team, or reported by patient.  Note: The patient tolerated the entire procedure well. A repeat set of  vitals were taken after the procedure and the patient was kept under observation following institutional policy, for this type of procedure. Post-procedural neurological assessment was performed, showing return to baseline, prior to discharge. The patient was provided with post-procedure discharge instructions, including a section on how to identify potential problems. Should any problems arise concerning this procedure, the patient was given instructions to immediately contact us, at any time, without hesitation. In any case, we plan to contact the patient by telephone for a follow-up status report regarding this interventional procedure.  Comments:  No additional relevant information.  Plan of Care  Orders:  Orders Placed This Encounter  Procedures   DG PAIN CLINIC C-ARM 1-60 MIN NO REPORT    Intraoperative interpretation by procedural physician at [facility].    Standing Status:   Standing    Number of Occurrences:   1    Order Specific Question:   Reason for exam:    Answer:   Assistance in needle guidance and placement for procedures requiring needle placement in or near specific anatomical locations not easily accessible without such assistance.       Medications ordered for procedure: Meds ordered this encounter  Medications   ropivacaine (PF) 2 mg/mL (0.2%) (NAROPIN) injection 9 mL   ropivacaine (PF) 2 mg/mL (0.2%) (NAROPIN) injection 9 mL      Medications administered: Jasmine Pang. Poblete "Marcelino Duster" had no medications administered during this visit.  See the medical record for exact dosing, route, and time of administration.  Follow-up plan:   Return if symptoms worsen or fail to improve.       Pharmacological management options:  Opioid Analgesics: Butrans patch, side effects at 7.5 mcg an hour, analgesic benefit with no side effects at 5 mcg an hour, continue  Membrane stabilizer: Continue gabapentin as prescribed  Muscle relaxant: Continue Flexeril as  prescribed  NSAID: To be determined at a later time  Other analgesic(s): To be determined at a  later time    Interventional management options: Brianna Arnold was informed that there is no guarantee that she would be a candidate for interventional therapies. The decision will be based on the results of diagnostic studies, as well as Ms. Chopp's risk profile.  Procedure(s) hx Left axillary peripheral nerve stimulation: 12/30/19 providing significant pain relief.  Removed 02/29/2020              Recent Visits Date Type Provider Dept  04/26/21 Procedure visit Edward JollyLateef, Finnian Husted, MD [facility]  04/13/21 Office Visit Edward JollyLateef, Ivorie Uplinger, MD [facility]  03/29/21 Procedure visit Edward JollyLateef, Dorthy Hustead, MD [facility]  03/08/21 Procedure visit Edward JollyLateef, Hazem Kenner, MD [facility]  Showing recent visits within past 90 days and meeting all other requirements Today's Visits Date Type Provider Dept  05/24/21 Procedure visit Edward JollyLateef, Kayra Crowell, MD [facility]  Showing today's visits and meeting all other requirements Future Appointments Date Type Provider Dept  06/21/21 Appointment Edward JollyLateef, Clarke Amburn, MD [facility]  07/18/21 Appointment Edward JollyLateef, Sabra Sessler, MD [facility]  Showing future appointments within next 90 days and meeting all other requirements  Disposition: Discharge home  Discharge (Date   Time): 05/24/2021; 1459 hrs.   Primary Care Physician: Marguarite ArbourSparks, Jeffrey D, MD [facility] Note by: Edward JollyBilal Toyoko Silos, MD Date: 05/24/2021; Time: 3:21 PM  Disclaimer:  Medicine is not an Visual merchandiserexact science. The only guarantee in medicine is that nothing is guaranteed. It is important to note that the decision to proceed with this intervention was based on the information collected from the patient. The Data and conclusions were drawn from the patient's questionnaire, the interview, and the physical examination. Because the information was provided  in large part by the patient, it cannot be guaranteed that it has not been purposely or unconsciously manipulated. Every effort has been made to obtain as much relevant data as possible for this evaluation. It is important to note that the conclusions that lead to this procedure are derived in large part from the available data. Always take into account that the treatment will also be dependent on availability of resources and existing treatment guidelines, considered by other Pain Management Practitioners as being common knowledge and practice, at the time of the intervention. For Medico-Legal purposes, it is also important to point out that variation in procedural techniques and pharmacological choices are the acceptable norm. The indications, contraindications, technique, and results of the above procedure should only be interpreted and judged by a Board-Certified Interventional Pain Specialist with extensive familiarity and expertise in the same exact procedure and technique.

## 2021-05-25 ENCOUNTER — Telehealth: Payer: Self-pay | Admitting: *Deleted

## 2021-05-25 NOTE — Telephone Encounter (Signed)
Post procedure call;  voicemail left.  

## 2021-06-07 ENCOUNTER — Ambulatory Visit
Admission: RE | Admit: 2021-06-07 | Discharge: 2021-06-07 | Disposition: A | Discharge status: Home / Self Care | Payer: Medicare HMO | Source: Ambulatory Visit | Attending: Physician Assistant | Admitting: Physician Assistant

## 2021-06-07 ENCOUNTER — Other Ambulatory Visit: Payer: Self-pay | Admitting: Physician Assistant

## 2021-06-07 DIAGNOSIS — R1011 Right upper quadrant pain: Secondary | ICD-10-CM | POA: Diagnosis present

## 2021-06-07 DIAGNOSIS — R1013 Epigastric pain: Secondary | ICD-10-CM

## 2021-06-21 ENCOUNTER — Ambulatory Visit
Admission: RE | Admit: 2021-06-21 | Discharge: 2021-06-21 | Disposition: A | Discharge status: Home / Self Care | Payer: Medicare HMO | Source: Ambulatory Visit | Attending: Student in an Organized Health Care Education/Training Program | Admitting: Student in an Organized Health Care Education/Training Program

## 2021-06-21 ENCOUNTER — Encounter: Payer: Self-pay | Admitting: Student in an Organized Health Care Education/Training Program

## 2021-06-21 ENCOUNTER — Other Ambulatory Visit: Payer: Self-pay

## 2021-06-21 ENCOUNTER — Ambulatory Visit (HOSPITAL_BASED_OUTPATIENT_CLINIC_OR_DEPARTMENT_OTHER): Payer: Medicare HMO | Admitting: Student in an Organized Health Care Education/Training Program

## 2021-06-21 VITALS — BP 119/102 | HR 92 | Temp 99.1°F | Resp 18 | Ht <= 58 in | Wt 142.0 lb

## 2021-06-21 DIAGNOSIS — G894 Chronic pain syndrome: Secondary | ICD-10-CM | POA: Diagnosis not present

## 2021-06-21 DIAGNOSIS — M7918 Myalgia, other site: Secondary | ICD-10-CM | POA: Diagnosis not present

## 2021-06-21 DIAGNOSIS — M4105 Infantile idiopathic scoliosis, thoracolumbar region: Secondary | ICD-10-CM | POA: Insufficient documentation

## 2021-06-21 MED ORDER — ROPIVACAINE HCL 2 MG/ML IJ SOLN
INTRAMUSCULAR | Status: AC
  Filled 2021-06-21: qty 20

## 2021-06-21 MED ORDER — DIAZEPAM 5 MG PO TABS
ORAL_TABLET | ORAL | Status: AC
  Filled 2021-06-21: qty 1

## 2021-06-21 MED ORDER — ROPIVACAINE HCL 2 MG/ML IJ SOLN
9.0000 mL | Freq: Once | INTRAMUSCULAR | Status: AC
  Administered 2021-06-21: 20 mL via PERINEURAL

## 2021-06-21 NOTE — Progress Notes (Signed)
Safety precautions to be maintained throughout the outpatient stay will include: orient to surroundings, keep bed in low position, maintain call bell within reach at all times, provide assistance with transfer out of bed and ambulation.  

## 2021-06-21 NOTE — Progress Notes (Signed)
PROVIDER NOTE: Information contained herein reflects review and annotations entered in association with encounter. Interpretation of such information and data should be left to medically-trained personnel. Information provided to patient can be located elsewhere in the medical record under "Patient Instructions". Document created using STT-dictation technology, any transcriptional errors that may result from process are unintentional.    Patient: Brianna Arnold  Service Category: Procedure  Provider: Edward Jolly, MD  DOB: 21-May-1990  DOS: 06/21/2021  Location: [facility]  MRN: [id number]  Setting: Ambulatory - outpatient  Referring Provider: Marguarite Arbour, MD  Type: Established Patient  Specialty: Interventional Pain Management  PCP: Marguarite Arbour, MD   Primary Reason for Visit: Interventional Pain Management Treatment. CC: Back Pain (Mid to upper back ) and Neck Pain (Bilateral )   Procedure:          Anesthesia, Analgesia, Anxiolysis:  Type: Lumbar Paraspinal Muscle Trigger Point Injection (3+)         Quadratus lumborum, erector spinae, multifidus and periscapular region and bilateral cervical/trapezius TPI CPT: 20553 with fluoroscopy Primary Purpose: Therapeutic Approach: Percutaneous, ipsilateral approach. Laterality: Midline        Type: Local Anesthesia   Position: Prone   Indications: 1. Infantile idiopathic scoliosis of thoracolumbar region   2. Myofascial pain syndrome of lumbar spine   3. Chronic pain syndrome       Pain Score: Pre-procedure: 7 /10 Post-procedure: 7 /10   Pre-op H&P Assessment:  Brianna Arnold is a 31 y.o. (year old), female patient, seen today for interventional treatment. She  has a past surgical history that includes Cervical spine surgery (07/2014); Back surgery (10/2014); and Anterior cervical decomp/discectomy fusion (N/A, 02/11/2019). Brianna Arnold has a current medication list which includes the following  prescription(s): albuterol, baclofen, buprenorphine, vitamin d3, cyclobenzaprine, dicyclomine, ergocalciferol, famotidine, ferrous sulfate, gabapentin, medroxyprogesterone, ofloxacin, promethazine, propranolol er, tramadol, vitamin b-12, zolpidem, and trazodone. Her primarily concern today is the Back Pain (Mid to upper back ) and Neck Pain (Bilateral )   Initial Vital Signs:  Pulse/HCG Rate: 92ECG Heart Rate: 89 Temp: 99.1 F (37.3 C) Resp: 16 BP: (!) 151/95 SpO2: 100 %  BMI: Estimated body mass index is 33 kg/m as calculated from the following:   Height as of this encounter: 4\' 7"  (1.397 m).   Weight as of this encounter: 142 lb (64.4 kg).  Risk Assessment: Allergies: Reviewed. She is allergic to hypafix [wound dressings], ativan [lorazepam], and tizanidine.  Allergy Precautions: None required Coagulopathies: Reviewed. None identified.  Blood-thinner therapy: None at this time Active Infection(s): Reviewed. None identified. Brianna Arnold is afebrile  Site Confirmation: Brianna Arnold was asked to confirm the procedure and laterality before marking the site Procedure checklist: Completed Consent: Before the procedure and under the influence of no sedative(s), amnesic(s), or anxiolytics, the patient was informed of the treatment options, risks and possible complications. To fulfill our ethical and legal obligations, as recommended by the American Medical Association's Code of Ethics, I have informed the patient of my clinical impression; the nature and purpose of the treatment or procedure; the risks, benefits, and possible complications of the intervention; the alternatives, including doing nothing; the risk(s) and benefit(s) of the alternative treatment(s) or procedure(s); and the risk(s) and benefit(s) of doing nothing. The patient was provided information about the general risks and possible complications associated with the procedure. These may include, but are not limited to: failure to  achieve desired goals, infection, bleeding, organ or nerve damage, allergic reactions, paralysis, and  death. In addition, the patient was informed of those risks and complications associated to the procedure, such as failure to decrease pain; infection; bleeding; organ or nerve damage with subsequent damage to sensory, motor, and/or autonomic systems, resulting in permanent pain, numbness, and/or weakness of one or several areas of the body; allergic reactions; (i.e.: anaphylactic reaction); and/or death. Furthermore, the patient was informed of those risks and complications associated with the medications. These include, but are not limited to: allergic reactions (i.e.: anaphylactic or anaphylactoid reaction(s)); adrenal axis suppression; blood sugar elevation that in diabetics may result in ketoacidosis or comma; water retention that in patients with history of congestive heart failure may result in shortness of breath, pulmonary edema, and decompensation with resultant heart failure; weight gain; swelling or edema; medication-induced neural toxicity; particulate matter embolism and blood vessel occlusion with resultant organ, and/or nervous system infarction; and/or aseptic necrosis of one or more joints. Finally, the patient was informed that Medicine is not an exact science; therefore, there is also the possibility of unforeseen or unpredictable risks and/or possible complications that may result in a catastrophic outcome. The patient indicated having understood very clearly. We have given the patient no guarantees and we have made no promises. Enough time was given to the patient to ask questions, all of which were answered to the patient's satisfaction. Brianna Arnold has indicated that she wanted to continue with the procedure. Attestation: I, the ordering provider, attest that I have discussed with the patient the benefits, risks, side-effects, alternatives, likelihood of achieving goals, and potential  problems during recovery for the procedure that I have provided informed consent. Date   Time: 06/21/2021  1:56 PM  Pre-Procedure Preparation:  Monitoring: As per clinic protocol. Respiration, ETCO2, SpO2, BP, heart rate and rhythm monitor placed and checked for adequate function Safety Precautions: Patient was assessed for positional comfort and pressure points before starting the procedure. Time-out: I initiated and conducted the "Time-out" before starting the procedure, as per protocol. The patient was asked to participate by confirming the accuracy of the "Time Out" information. Verification of the correct person, site, and procedure were performed and confirmed by me, the nursing staff, and the patient. "Time-out" conducted as per Joint Commission's Universal Protocol (UP.01.01.01). Time: 1429  Description of Procedure:          Area Prepped: Entire Lower Lumbosacral Region & Right cervical region DuraPrep (Iodine Povacrylex [0.7% available iodine] and Isopropyl Alcohol, 74% w/w) Safety Precautions: Aspiration looking for blood return was conducted prior to all injections. At no point did we inject any substances, as a needle was being advanced. No attempts were made at seeking any paresthesias. Safe injection practices and needle disposal techniques used. Medications properly checked for expiration dates. SDV (single dose vial) medications used. Description of the Procedure: Protocol guidelines were followed. The patient was placed in position over the fluoroscopy table. The target area was identified and the area prepped in the usual manner. Skin & deeper tissues infiltrated with local anesthetic. Appropriate amount of time allowed to pass for local anesthetics to take effect. The procedure needles were then advanced to the target area. Proper needle placement secured. Negative aspiration confirmed. Solution injected in intermittent fashion, asking for systemic symptoms every 0.5cc of injectate.  The needles were then removed and the area cleansed, making sure to leave some of the prepping solution back to take advantage of its long term bactericidal properties.  Vitals:   06/21/21 1423 06/21/21 1429 06/21/21 1434 06/21/21 1436  BP: Marland Kitchen)  148/116 (!) 146/122 (!) 157/122 (!) 119/102  Pulse:      Resp: 18 18 16 18   Temp:      TempSrc:      SpO2: 98% 100% 100% 98%  Weight:      Height:           Start Time: 1429 hrs. End Time: 1435 hrs. Materials:  Needle(s) Type: Regular needle Gauge: 27G Length: 1.5-in  Medication(s): Please see orders for medications and dosing details.   12 trigger points were also injected in the right and left cervical, right and left trapezius region with dry needling performed.  Each trigger point was injected with 0.5 to 1 cc of 0.2% ropivacaine.  Imaging Guidance:          Type of Imaging Technique: Fluoroscopy Guidance (Spinal) Indication(s): Assistance in needle guidance and placement for procedures requiring needle placement in or near specific anatomical locations not easily accessible without such assistance. Exposure Time: No patient exposure Contrast: None used. Fluoroscopic Guidance: I was personally present during the use of fluoroscopy. "Tunnel Vision Technique" used to obtain the best possible view of the target area. Parallax error corrected before commencing the procedure. "Direction-depth-direction" technique used to introduce the needle under continuous pulsed fluoroscopy. Once target was reached, antero-posterior, oblique, and lateral fluoroscopic projection used confirm needle placement in all planes. Images permanently stored in EMR. Ultrasound Guidance: N/A   Post-operative Assessment:  Post-procedure Vital Signs:  Pulse/HCG Rate: 9295 Temp: 99.1 F (37.3 C) Resp: 18 BP: (!) 119/102 SpO2: 98 %  EBL: None  Complications: No immediate post-treatment complications observed by team, or reported by patient.  Note: The  patient tolerated the entire procedure well. A repeat set of vitals were taken after the procedure and the patient was kept under observation following institutional policy, for this type of procedure. Post-procedural neurological assessment was performed, showing return to baseline, prior to discharge. The patient was provided with post-procedure discharge instructions, including a section on how to identify potential problems. Should any problems arise concerning this procedure, the patient was given instructions to immediately contact us, at any time, without hesitation. In any case, we plan to contact the patient by telephone for a follow-up status report regarding this interventional procedure.  Comments:  No additional relevant information.  Plan of Care  Orders:  Orders Placed This Encounter  Procedures   DG PAIN CLINIC C-ARM 1-60 MIN NO REPORT    Intraoperative interpretation by procedural physician at [facility].    Standing Status:   Standing    Number of Occurrences:   1    Order Specific Question:   Reason for exam:    Answer:   Assistance in needle guidance and placement for procedures requiring needle placement in or near specific anatomical locations not easily accessible without such assistance.       Medications ordered for procedure: Meds ordered this encounter  Medications   ropivacaine (PF) 2 mg/mL (0.2%) (NAROPIN) injection 9 mL      Medications administered: We administered ropivacaine (PF) 2 mg/mL (0.2%).  See the medical record for exact dosing, route, and time of administration.  Follow-up plan:   Return for Keep sch. appt.       Pharmacological management options:  Opioid Analgesics: Butrans patch, side effects at 7.5 mcg an hour, analgesic benefit with no side effects at 5 mcg an hour, continue  Membrane stabilizer: Continue gabapentin as prescribed  Muscle relaxant: Continue Flexeril as prescribed  NSAID: To be determined  at a later time   Other analgesic(s): To be determined at a later time    Interventional management options: Brianna Arnold was informed that there is no guarantee that she would be a candidate for interventional therapies. The decision will be based on the results of diagnostic studies, as well as Ms. Hunton's risk profile.  Procedure(s) hx Left axillary peripheral nerve stimulation: 12/30/19 providing significant pain relief.  Removed 02/29/2020              Recent Visits Date Type Provider Dept  05/24/21 Procedure visit Edward JollyLateef, Adena Sima, MD [facility]  04/26/21 Procedure visit Edward JollyLateef, Grayden Burley, MD [facility]  04/13/21 Office Visit Edward JollyLateef, Nickalous Stingley, MD [facility]  03/29/21 Procedure visit Edward JollyLateef, Dinesh Ulysse, MD [facility]  Showing recent visits within past 90 days and meeting all other requirements Today's Visits Date Type Provider Dept  06/21/21 Procedure visit Edward JollyLateef, Brianna Bennett, MD [facility]  Showing today's visits and meeting all other requirements Future Appointments Date Type Provider Dept  07/19/21 Appointment Edward JollyLateef, Marah Park, MD [facility]  Showing future appointments within next 90 days and meeting all other requirements  Disposition: Discharge home  Discharge (Date   Time): 06/21/2021; 1445 hrs.   Primary Care Physician: Marguarite ArbourSparks, Jeffrey D, MD [facility] Note by: Edward JollyBilal Naviah Belfield, MD Date: 06/21/2021; Time: 2:50 PM  Disclaimer:  Medicine is not an exact science. The only guarantee in medicine is that nothing is guaranteed. It is important to note that the decision to proceed with this intervention was based on the information collected from the patient. The Data and conclusions were drawn from the patient's questionnaire, the interview, and the physical examination. Because the information was provided in large part by the patient, it cannot be guaranteed that it has not been purposely or unconsciously manipulated.  Every effort has been made to obtain as much relevant data as possible for this evaluation. It is important to note that the conclusions that lead to this procedure are derived in large part from the available data. Always take into account that the treatment will also be dependent on availability of resources and existing treatment guidelines, considered by other Pain Management Practitioners as being common knowledge and practice, at the time of the intervention. For Medico-Legal purposes, it is also important to point out that variation in procedural techniques and pharmacological choices are the acceptable norm. The indications, contraindications, technique, and results of the above procedure should only be interpreted and judged by a Board-Certified Interventional Pain Specialist with extensive familiarity and expertise in the same exact procedure and technique.

## 2021-06-21 NOTE — Progress Notes (Signed)
Patient recently has had abdominal pain.  They thought it was gastritis.  Labs were done with Dr Judithann Sheen.  Patient has viewed these results and it shows that she has H-pylori.  Dr Judithann Sheen had told her that if the test was positive he would be calling in antibiotics.  ?

## 2021-06-21 NOTE — Progress Notes (Signed)
1415   Valium 5 mg po per Virgina Jock RN. ?

## 2021-06-22 ENCOUNTER — Telehealth: Payer: Self-pay | Admitting: *Deleted

## 2021-06-22 NOTE — Telephone Encounter (Signed)
No problems post procedure. 

## 2021-07-18 ENCOUNTER — Encounter: Payer: Medicare HMO | Admitting: Student in an Organized Health Care Education/Training Program

## 2021-07-19 ENCOUNTER — Encounter: Payer: Self-pay | Admitting: Student in an Organized Health Care Education/Training Program

## 2021-07-19 ENCOUNTER — Ambulatory Visit
Payer: Medicare HMO | Attending: Student in an Organized Health Care Education/Training Program | Admitting: Student in an Organized Health Care Education/Training Program

## 2021-07-19 VITALS — BP 134/93 | HR 94 | Temp 97.3°F | Resp 16 | Ht <= 58 in | Wt 142.0 lb

## 2021-07-19 DIAGNOSIS — M67912 Unspecified disorder of synovium and tendon, left shoulder: Secondary | ICD-10-CM | POA: Insufficient documentation

## 2021-07-19 DIAGNOSIS — Z981 Arthrodesis status: Secondary | ICD-10-CM | POA: Diagnosis present

## 2021-07-19 DIAGNOSIS — M4105 Infantile idiopathic scoliosis, thoracolumbar region: Secondary | ICD-10-CM

## 2021-07-19 DIAGNOSIS — M7918 Myalgia, other site: Secondary | ICD-10-CM | POA: Diagnosis present

## 2021-07-19 DIAGNOSIS — G894 Chronic pain syndrome: Secondary | ICD-10-CM | POA: Diagnosis present

## 2021-07-19 MED ORDER — ROPIVACAINE HCL 2 MG/ML IJ SOLN
9.0000 mL | Freq: Once | INTRAMUSCULAR | Status: AC
  Administered 2021-07-19: 9 mL via PERINEURAL

## 2021-07-19 MED ORDER — ROPIVACAINE HCL 2 MG/ML IJ SOLN
INTRAMUSCULAR | Status: AC
  Filled 2021-07-19: qty 20

## 2021-07-19 MED ORDER — DIAZEPAM 5 MG PO TABS
5.0000 mg | ORAL_TABLET | ORAL | Status: AC
  Administered 2021-07-19: 5 mg via ORAL

## 2021-07-19 MED ORDER — DIAZEPAM 5 MG PO TABS
ORAL_TABLET | ORAL | Status: AC
  Filled 2021-07-19: qty 1

## 2021-07-19 MED ORDER — BUPRENORPHINE 5 MCG/HR TD PTWK: 1.0000 | MEDICATED_PATCH | TRANSDERMAL | 3 refills | Status: DC

## 2021-07-19 NOTE — Progress Notes (Signed)
PROVIDER NOTE: Information contained herein reflects review and annotations entered in association with encounter. Interpretation of such information and data should be left to medically-trained personnel. Information provided to patient can be located elsewhere in the medical record under "Patient Instructions". Document created using STT-dictation technology, any transcriptional errors that may result from process are unintentional.  ?  ?Patient: Brianna Arnold  Service Category: Procedure  Provider: Edward Jolly, MD  ?DOB: [birth year]  DOS: 07/19/2021  Location: [facility]  ?MRN: [id number]  Setting: Ambulatory - outpatient  Referring Provider: Marguarite Arbour, MD  ?Type: Established Patient  Specialty: Interventional Pain Management  PCP: Brianna Arbour, MD  ? ?Primary Reason for Visit: Interventional Pain Management Treatment. ?CC: Back Pain (Upper, middle) ? ? ?Procedure:          Anesthesia, Analgesia, Anxiolysis:  ?Type: Lumbar Paraspinal Muscle Trigger Point Injection (3+)         Quadratus lumborum, erector spinae, multifidus and periscapular region and bilateral cervical/trapezius TPI ?CPT: 20553 with fluoroscopy ?Primary Purpose: Therapeutic ?Approach: Percutaneous, ipsilateral approach. ?Laterality: Midline        Type: Local Anesthesia  ? ?Position: Prone  ? ?Indications: ?1. Infantile idiopathic scoliosis of thoracolumbar region   ?2. Myofascial pain syndrome of lumbar spine   ?3. Chronic pain syndrome   ?4. Status post cervical spinal fusion   ?5. Disorder of left rotator cuff   ? ? ? ? ?Pain Score: ?Pre-procedure: 8 /10 ?Post-procedure: 2 /10  ? ?Pre-op H&P Assessment:  ?Brianna Arnold is a 31 y.o. (year old), female patient, seen today for interventional treatment. She  has a past surgical history that includes Cervical spine surgery (07/2014); Back surgery (10/2014); and Anterior cervical decomp/discectomy fusion (N/A, 02/11/2019). Brianna Arnold has a current medication  list which includes the following prescription(s): albuterol, baclofen, vitamin d3, cyclobenzaprine, dicyclomine, ergocalciferol, famotidine, ferrous sulfate, gabapentin, medroxyprogesterone, ofloxacin, promethazine, propranolol er, tramadol, vitamin b-12, zolpidem, buprenorphine, and trazodone. Her primarily concern today is the Back Pain (Upper, middle) ? ? ?Initial Vital Signs:  ?Pulse/HCG Rate: 94ECG Heart Rate: 93 ?Temp: (!) 97.3 ?F (36.3 ?C) ?Resp: 16 ?BP: 130/88 ?SpO2: 99 % ? ?BMI: Estimated body mass index is 33 kg/m? as calculated from the following: ?  Height as of this encounter: 4\' 7"  (1.397 m). ?  Weight as of this encounter: 142 lb (64.4 kg). ? ?Risk Assessment: ?Allergies: Reviewed. She is allergic to hypafix [wound dressings], ativan [lorazepam], and tizanidine.  ?Allergy Precautions: None required ?Coagulopathies: Reviewed. None identified.  ?Blood-thinner therapy: None at this time ?Active Infection(s): Reviewed. None identified. Brianna Arnold is afebrile ? ?Site Confirmation: Brianna Arnold was asked to confirm the procedure and laterality before marking the site ?Procedure checklist: Completed ?Consent: Before the procedure and under the influence of no sedative(s), amnesic(s), or anxiolytics, the patient was informed of the treatment options, risks and possible complications. To fulfill our ethical and legal obligations, as recommended by the American Medical Association's Code of Ethics, I have informed the patient of my clinical impression; the nature and purpose of the treatment or procedure; the risks, benefits, and possible complications of the intervention; the alternatives, including doing nothing; the risk(s) and benefit(s) of the alternative treatment(s) or procedure(s); and the risk(s) and benefit(s) of doing nothing. ?The patient was provided information about the general risks and possible complications associated with the procedure. These may include, but are not limited to: failure to  achieve desired goals, infection, bleeding, organ or nerve damage, allergic reactions, paralysis, and  death. ?In addition, the patient was informed of those risks and complications associated to the procedure, such as failure to decrease pain; infection; bleeding; organ or nerve damage with subsequent damage to sensory, motor, and/or autonomic systems, resulting in permanent pain, numbness, and/or weakness of one or several areas of the body; allergic reactions; (i.e.: anaphylactic reaction); and/or death. ?Furthermore, the patient was informed of those risks and complications associated with the medications. These include, but are not limited to: allergic reactions (i.e.: anaphylactic or anaphylactoid reaction(s)); adrenal axis suppression; blood sugar elevation that in diabetics may result in ketoacidosis or comma; water retention that in patients with history of congestive heart failure may result in shortness of breath, pulmonary edema, and decompensation with resultant heart failure; weight gain; swelling or edema; medication-induced neural toxicity; particulate matter embolism and blood vessel occlusion with resultant organ, and/or nervous system infarction; and/or aseptic necrosis of one or more joints. ?Finally, the patient was informed that Medicine is not an exact science; therefore, there is also the possibility of unforeseen or unpredictable risks and/or possible complications that may result in a catastrophic outcome. The patient indicated having understood very clearly. We have given the patient no guarantees and we have made no promises. Enough time was given to the patient to ask questions, all of which were answered to the patient's satisfaction. Brianna Arnold has indicated that she wanted to continue with the procedure. ?Attestation: I, the ordering provider, attest that I have discussed with the patient the benefits, risks, side-effects, alternatives, likelihood of achieving goals, and potential  problems during recovery for the procedure that I have provided informed consent. ?Date  Time: 07/19/2021  1:49 PM ? ?Pre-Procedure Preparation:  ?Monitoring: As per clinic protocol. Respiration, ETCO2, SpO2, BP, heart rate and rhythm monitor placed and checked for adequate function ?Safety Precautions: Patient was assessed for positional comfort and pressure points before starting the procedure. ?Time-out: I initiated and conducted the "Time-out" before starting the procedure, as per protocol. The patient was asked to participate by confirming the accuracy of the "Time Out" information. Verification of the correct person, site, and procedure were performed and confirmed by me, the nursing staff, and the patient. "Time-out" conducted as per Joint Commission's Universal Protocol (UP.01.01.01). ?Time: 1407 ? ?Description of Procedure:          ?Area Prepped: Entire Lower Lumbosacral Region & Right cervical region ?DuraPrep (Iodine Povacrylex [0.7% available iodine] and Isopropyl Alcohol, 74% w/w) ?Safety Precautions: Aspiration looking for blood return was conducted prior to all injections. At no point did we inject any substances, as a needle was being advanced. No attempts were made at seeking any paresthesias. Safe injection practices and needle disposal techniques used. Medications properly checked for expiration dates. SDV (single dose vial) medications used. ?Description of the Procedure: Protocol guidelines were followed. The patient was placed in position over the fluoroscopy table. The target area was identified and the area prepped in the usual manner. Skin & deeper tissues infiltrated with local anesthetic. Appropriate amount of time allowed to pass for local anesthetics to take effect. The procedure needles were then advanced to the target area. Proper needle placement secured. Negative aspiration confirmed. Solution injected in intermittent fashion, asking for systemic symptoms every 0.5cc of injectate.  The needles were then removed and the area cleansed, making sure to leave some of the prepping solution back to take advantage of its long term bactericidal properties. ? ?Vitals:  ? 07/19/21 1352 07/19/21 1416

## 2021-07-19 NOTE — Patient Instructions (Signed)

## 2021-07-19 NOTE — Progress Notes (Signed)
Nursing Pain Medication Assessment:  ?Safety precautions to be maintained throughout the outpatient stay will include: orient to surroundings, keep bed in low position, maintain call bell within reach at all times, provide assistance with transfer out of bed and ambulation.  ?Medication Inspection Compliance: Pill count conducted under aseptic conditions, in front of the patient. Neither the pills nor the bottle was removed from the patient's sight at any time. Once count was completed pills were immediately returned to the patient in their original bottle. ? ?Medication: Buprenorphine (Suboxone) ?Pill/Patch Count:  2 of 4 pills remain ?Pill/Patch Appearance: Markings consistent with prescribed medication ?Bottle Appearance: Standard pharmacy container. Clearly labeled. ?Filled Date: unable to determine, label unavailable ?Last Medication intake:   applied 6 days ago ?

## 2021-07-20 ENCOUNTER — Telehealth: Payer: Self-pay | Admitting: *Deleted

## 2021-07-20 ENCOUNTER — Encounter: Payer: Medicare HMO | Admitting: Student in an Organized Health Care Education/Training Program

## 2021-07-20 NOTE — Telephone Encounter (Signed)
Attempted to call for post procedure follow-up. Message left. 

## 2021-08-07 ENCOUNTER — Encounter: Payer: Self-pay | Admitting: Student in an Organized Health Care Education/Training Program

## 2021-08-07 ENCOUNTER — Ambulatory Visit
Payer: Medicare HMO | Attending: Student in an Organized Health Care Education/Training Program | Admitting: Student in an Organized Health Care Education/Training Program

## 2021-08-07 VITALS — BP 148/100 | HR 95 | Temp 97.3°F | Resp 16 | Ht <= 58 in | Wt 142.0 lb

## 2021-08-07 DIAGNOSIS — M7918 Myalgia, other site: Secondary | ICD-10-CM | POA: Diagnosis present

## 2021-08-07 DIAGNOSIS — G894 Chronic pain syndrome: Secondary | ICD-10-CM | POA: Diagnosis present

## 2021-08-07 DIAGNOSIS — M4105 Infantile idiopathic scoliosis, thoracolumbar region: Secondary | ICD-10-CM | POA: Diagnosis present

## 2021-08-07 MED ORDER — ROPIVACAINE HCL 2 MG/ML IJ SOLN
9.0000 mL | Freq: Once | INTRAMUSCULAR | Status: AC
  Administered 2021-08-07: 9 mL via PERINEURAL

## 2021-08-07 MED ORDER — ROPIVACAINE HCL 2 MG/ML IJ SOLN
INTRAMUSCULAR | Status: AC
  Filled 2021-08-07: qty 20

## 2021-08-07 NOTE — Progress Notes (Signed)
PROVIDER NOTE: Information contained herein reflects review and annotations entered in association with encounter. Interpretation of such information and data should be left to medically-trained personnel. Information provided to patient can be located elsewhere in the medical record under "Patient Instructions". Document created using STT-dictation technology, any transcriptional errors that may result from process are unintentional.  ?  ?Patient: Brianna Arnold  Service Category: Procedure  Provider: Edward Jolly, MD  ?DOB: 08-15-1990  DOS: 08/07/2021  Location: [facility]  ?MRN: [id number]  Setting: Ambulatory - outpatient  Referring Provider: Marguarite Arbour, MD  ?Type: Established Patient  Specialty: Interventional Pain Management  PCP: Marguarite Arbour, MD  ? ?Primary Reason for Visit: Interventional Pain Management Treatment. ?CC: Back Pain (lower) ? ? ?Procedure:          Anesthesia, Analgesia, Anxiolysis:  ?Type: Lumbar Paraspinal Muscle Trigger Point Injection (3+)         Quadratus lumborum, erector spinae, multifidus and periscapular region and bilateral cervical/trapezius TPI ?CPT: 20553 with fluoroscopy ?Primary Purpose: Therapeutic ?Approach: Percutaneous, ipsilateral approach. ?Laterality: Midline        Type: Local Anesthesia  ? ?Position: Prone  ? ?Indications: ?1. Infantile idiopathic scoliosis of thoracolumbar region   ?2. Myofascial pain syndrome of lumbar spine   ?3. Chronic pain syndrome   ? ? ? ? ?Pain Score: ?Pre-procedure: 8 /10 ?Post-procedure: 8 /10  ? ?Pre-op H&P Assessment:  ?Brianna Arnold is a 31 y.o. (year old), female patient, seen today for interventional treatment. She  has a past surgical history that includes Cervical spine surgery (07/2014); Back surgery (10/2014); and Anterior cervical decomp/discectomy fusion (N/A, 02/11/2019). Brianna Arnold has a current medication list which includes the following prescription(s): albuterol, baclofen, buprenorphine,  vitamin d3, cyclobenzaprine, dicyclomine, ergocalciferol, famotidine, ferrous sulfate, gabapentin, medroxyprogesterone, ofloxacin, promethazine, propranolol er, tramadol, vitamin b-12, zolpidem, and trazodone, and the following Facility-Administered Medications: ropivacaine (pf) 2 mg/ml (0.2%). Her primarily concern today is the Back Pain (lower) ? ? ?Initial Vital Signs:  ?Pulse/HCG Rate: 95  ?Temp: (!) 97.3 ?F (36.3 ?C) ?Resp:   ?BP: (!) 133/95 ?SpO2: 100 % ? ?BMI: Estimated body mass index is 33 kg/m? as calculated from the following: ?  Height as of this encounter: 4\' 7"  (1.397 m). ?  Weight as of this encounter: 142 lb (64.4 kg). ? ?Risk Assessment: ?Allergies: Reviewed. She is allergic to hypafix [wound dressings], ativan [lorazepam], and tizanidine.  ?Allergy Precautions: None required ?Coagulopathies: Reviewed. None identified.  ?Blood-thinner therapy: None at this time ?Active Infection(s): Reviewed. None identified. Brianna Arnold is afebrile ? ?Site Confirmation: [name] was asked to confirm the procedure and laterality before marking the site ?Procedure checklist: Completed ?Consent: Before the procedure and under the influence of no sedative(s), amnesic(s), or anxiolytics, the patient was informed of the treatment options, risks and possible complications. To fulfill our ethical and legal obligations, as recommended by the American Medical Association's Code of Ethics, I have informed the patient of my clinical impression; the nature and purpose of the treatment or procedure; the risks, benefits, and possible complications of the intervention; the alternatives, including doing nothing; the risk(s) and benefit(s) of the alternative treatment(s) or procedure(s); and the risk(s) and benefit(s) of doing nothing. ?The patient was provided information about the general risks and possible complications associated with the procedure. These may include, but are not limited to: failure to achieve desired goals,  infection, bleeding, organ or nerve damage, allergic reactions, paralysis, and death. ?In addition, the patient was informed of  those risks and complications associated to the procedure, such as failure to decrease pain; infection; bleeding; organ or nerve damage with subsequent damage to sensory, motor, and/or autonomic systems, resulting in permanent pain, numbness, and/or weakness of one or several areas of the body; allergic reactions; (i.e.: anaphylactic reaction); and/or death. ?Furthermore, the patient was informed of those risks and complications associated with the medications. These include, but are not limited to: allergic reactions (i.e.: anaphylactic or anaphylactoid reaction(s)); adrenal axis suppression; blood sugar elevation that in diabetics may result in ketoacidosis or comma; water retention that in patients with history of congestive heart failure may result in shortness of breath, pulmonary edema, and decompensation with resultant heart failure; weight gain; swelling or edema; medication-induced neural toxicity; particulate matter embolism and blood vessel occlusion with resultant organ, and/or nervous system infarction; and/or aseptic necrosis of one or more joints. ?Finally, the patient was informed that Medicine is not an exact science; therefore, there is also the possibility of unforeseen or unpredictable risks and/or possible complications that may result in a catastrophic outcome. The patient indicated having understood very clearly. We have given the patient no guarantees and we have made no promises. Enough time was given to the patient to ask questions, all of which were answered to the patient's satisfaction. Brianna Arnold has indicated that she wanted to continue with the procedure. ?Attestation: I, the ordering provider, attest that I have discussed with the patient the benefits, risks, side-effects, alternatives, likelihood of achieving goals, and potential problems during recovery  for the procedure that I have provided informed consent. ?Date  Time: 08/07/2021  2:00 PM ? ?Pre-Procedure Preparation:  ?Monitoring: As per clinic protocol. Respiration, ETCO2, SpO2, BP, heart rate and rhythm monitor placed and checked for adequate function ?Safety Precautions: Patient was assessed for positional comfort and pressure points before starting the procedure. ?Time-out: I initiated and conducted the "Time-out" before starting the procedure, as per protocol. The patient was asked to participate by confirming the accuracy of the "Time Out" information. Verification of the correct person, site, and procedure were performed and confirmed by me, the nursing staff, and the patient. "Time-out" conducted as per Joint Commission's Universal Protocol ([email or web address].01.01.01). ?Time:   ? ?Description of Procedure:          ?Area Prepped: Entire Lower Lumbosacral Region & Right cervical region ?DuraPrep (Iodine Povacrylex [0.7% available iodine] and Isopropyl Alcohol, 74% w/w) ?Safety Precautions: Aspiration looking for blood return was conducted prior to all injections. At no point did we inject any substances, as a needle was being advanced. No attempts were made at seeking any paresthesias. Safe injection practices and needle disposal techniques used. Medications properly checked for expiration dates. SDV (single dose vial) medications used. ?Description of the Procedure: Protocol guidelines were followed. The patient was placed in position over the fluoroscopy table. The target area was identified and the area prepped in the usual manner. Skin & deeper tissues infiltrated with local anesthetic. Appropriate amount of time allowed to pass for local anesthetics to take effect. The procedure needles were then advanced to the target area. Proper needle placement secured. Negative aspiration confirmed. Solution injected in intermittent fashion, asking for systemic symptoms every 0.5cc of injectate. The needles were then removed  and the area cleansed, making sure to leave some of the prepping solution back to take advantage of its long term bactericidal properties. ? ?Vitals:  ? 08/07/21 1405  ?BP: (!) 133/95  ?Pulse: 95  ?Temp: (

## 2021-08-07 NOTE — Patient Instructions (Signed)

## 2021-08-07 NOTE — Progress Notes (Signed)
Safety precautions to be maintained throughout the outpatient stay will include: orient to surroundings, keep bed in low position, maintain call bell within reach at all times, provide assistance with transfer out of bed and ambulation.  

## 2021-08-08 ENCOUNTER — Telehealth: Payer: Self-pay

## 2021-08-08 NOTE — Telephone Encounter (Signed)
Post procedure phone call. Patient states she is doing good.  

## 2021-08-14 ENCOUNTER — Telehealth: Payer: Self-pay | Admitting: Student in an Organized Health Care Education/Training Program

## 2021-08-14 DIAGNOSIS — G894 Chronic pain syndrome: Secondary | ICD-10-CM

## 2021-08-14 DIAGNOSIS — M4105 Infantile idiopathic scoliosis, thoracolumbar region: Secondary | ICD-10-CM

## 2021-08-14 DIAGNOSIS — M67912 Unspecified disorder of synovium and tendon, left shoulder: Secondary | ICD-10-CM

## 2021-08-14 DIAGNOSIS — Z981 Arthrodesis status: Secondary | ICD-10-CM

## 2021-08-14 MED ORDER — BUPRENORPHINE 5 MCG/HR TD PTWK: 1.0000 | MEDICATED_PATCH | TRANSDERMAL | 0 refills | Status: AC

## 2021-08-14 NOTE — Telephone Encounter (Signed)
Patient notified that 3 patch has been resent.  Informed patient that she needed to notify us if she found the patches./  ?

## 2021-08-14 NOTE — Telephone Encounter (Signed)
Dr Holley Raring, the patient has misplaced her box of patches which had 3 left in it.  She is asking for an early refill but last got them filled 10 days ago.  I spoke with the pharmacist and she states that you could write a script for 3 patches and they would fill them for this one exception but she would have to pay out of pocket.  I dont know how you would like to handle this.  Thanks ?

## 2021-08-14 NOTE — Telephone Encounter (Signed)
Message sent to Dr Lateef 

## 2021-08-18 ENCOUNTER — Telehealth: Payer: Self-pay | Admitting: Student in an Organized Health Care Education/Training Program

## 2021-08-18 NOTE — Telephone Encounter (Signed)
Patient called stating that the pharmacy [facility] couldn't fill her patches as requested. So they gave her an new box of patches. Patient feels like she will not have enough to last until her next appt. Please give pt a call. Thanks ?

## 2021-08-18 NOTE — Telephone Encounter (Signed)
Dr Holley Raring sent in a prescription for 3 patches because patient had misplaced her last 3 patches.  The pharmacy instead of filling the 3 patches, they early filled her next prescription.  She has one more prescription for the Buprenorphine patch but will run out of meds before her next appointment.  This will mess up her current fill dates.  I will have patient reschedule her appointment before 6-23 and we will need to let Dr Holley Raring know her med dates have changed.  She filled the last one on 4-24, can fill her last one on 5-24 to last until 6-23.  ?

## 2021-08-30 ENCOUNTER — Ambulatory Visit
Payer: Medicare HMO | Attending: Student in an Organized Health Care Education/Training Program | Admitting: Student in an Organized Health Care Education/Training Program

## 2021-08-30 ENCOUNTER — Encounter: Payer: Self-pay | Admitting: Student in an Organized Health Care Education/Training Program

## 2021-08-30 VITALS — BP 164/94 | HR 105 | Temp 97.9°F | Resp 16 | Ht <= 58 in | Wt 145.0 lb

## 2021-08-30 DIAGNOSIS — M4105 Infantile idiopathic scoliosis, thoracolumbar region: Secondary | ICD-10-CM | POA: Diagnosis not present

## 2021-08-30 DIAGNOSIS — G894 Chronic pain syndrome: Secondary | ICD-10-CM | POA: Diagnosis present

## 2021-08-30 DIAGNOSIS — M7918 Myalgia, other site: Secondary | ICD-10-CM | POA: Diagnosis present

## 2021-08-30 MED ORDER — DIAZEPAM 5 MG PO TABS
5.0000 mg | ORAL_TABLET | ORAL | Status: AC
  Administered 2021-08-30: 5 mg via ORAL

## 2021-08-30 MED ORDER — DIAZEPAM 5 MG PO TABS
ORAL_TABLET | ORAL | Status: AC
  Filled 2021-08-30: qty 1

## 2021-08-30 MED ORDER — ROPIVACAINE HCL 2 MG/ML IJ SOLN
9.0000 mL | Freq: Once | INTRAMUSCULAR | Status: AC
  Administered 2021-08-30: 9 mL via PERINEURAL

## 2021-08-30 NOTE — Progress Notes (Signed)
Safety precautions to be maintained throughout the outpatient stay will include: orient to surroundings, keep bed in low position, maintain call bell within reach at all times, provide assistance with transfer out of bed and ambulation.  

## 2021-08-30 NOTE — Progress Notes (Signed)
PROVIDER NOTE: Information contained herein reflects review and annotations entered in association with encounter. Interpretation of such information and data should be left to medically-trained personnel. Information provided to patient can be located elsewhere in the medical record under "Patient Instructions". Document created using STT-dictation technology, any transcriptional errors that may result from process are unintentional.  ?  ?Patient: Brianna Arnold  Service Category: Procedure  Provider: Edward Jolly, MD  ?DOB: 07/20/1990  DOS: 08/30/2021  Location: [facility]  ?MRN: [id number]  Setting: Ambulatory - outpatient  Referring Provider: Marguarite Arbour, MD  ?Type: Established Patient  Specialty: Interventional Pain Management  PCP: Marguarite Arbour, MD  ? ?Primary Reason for Visit: Interventional Pain Management Treatment. ?CC: Back Pain ? ? ?Procedure:          Anesthesia, Analgesia, Anxiolysis:  ?Type: Lumbar Paraspinal Muscle Trigger Point Injection (3+)         Quadratus lumborum, erector spinae, multifidus and periscapular region and bilateral cervical/trapezius TPI ?CPT: 20553 ?Primary Purpose: Therapeutic ?Approach: Percutaneous, ipsilateral approach. ?Laterality: Midline        Type: Local Anesthesia with PO Valium ? ?Position: Prone  ? ?Indications: ?1. Infantile idiopathic scoliosis of thoracolumbar region   ?2. Myofascial pain syndrome of lumbar spine   ?3. Chronic pain syndrome   ? ? ? ? ?Pain Score: ?Pre-procedure: 7 /10 ?Post-procedure: 3 /10  ? ?Pre-op H&P Assessment:  ?Brianna Arnold is a 31 y.o. (year old), female patient, seen today for interventional treatment. She  has a past surgical history that includes Cervical spine surgery (07/2014); Back surgery (10/2014); and Anterior cervical decomp/discectomy fusion (N/A, 02/11/2019). Brianna Arnold has a current medication list which includes the following prescription(s): albuterol, baclofen, buprenorphine, vitamin d3,  cyclobenzaprine, dicyclomine, ergocalciferol, famotidine, ferrous sulfate, gabapentin, medroxyprogesterone, ofloxacin, promethazine, propranolol er, tramadol, vitamin b-12, zolpidem, and trazodone. Her primarily concern today is the Back Pain ? ? ?Initial Vital Signs:  ?Pulse/HCG Rate: (!) 104  ?Temp: 97.9 ?F (36.6 ?C) ?Resp: 16 ?BP: 125/90 ?SpO2: 100 % ? ?BMI: Estimated body mass index is 33.1 kg/m? as calculated from the following: ?  Height as of this encounter: 4' 7.5" (1.41 m). ?  Weight as of this encounter: 145 lb (65.8 kg). ? ?Risk Assessment: ?Allergies: Reviewed. She is allergic to hypafix [wound dressings], ativan [lorazepam], and tizanidine.  ?Allergy Precautions: None required ?Coagulopathies: Reviewed. None identified.  ?Blood-thinner therapy: None at this time ?Active Infection(s): Reviewed. None identified. Brianna Arnold is afebrile ? ?Site Confirmation: Brianna Arnold was asked to confirm the procedure and laterality before marking the site ?Procedure checklist: Completed ?Consent: Before the procedure and under the influence of no sedative(s), amnesic(s), or anxiolytics, the patient was informed of the treatment options, risks and possible complications. To fulfill our ethical and legal obligations, as recommended by the American Medical Association's Code of Ethics, I have informed the patient of my clinical impression; the nature and purpose of the treatment or procedure; the risks, benefits, and possible complications of the intervention; the alternatives, including doing nothing; the risk(s) and benefit(s) of the alternative treatment(s) or procedure(s); and the risk(s) and benefit(s) of doing nothing. ?The patient was provided information about the general risks and possible complications associated with the procedure. These may include, but are not limited to: failure to achieve desired goals, infection, bleeding, organ or nerve damage, allergic reactions, paralysis, and death. ?In addition, the  patient was informed of those risks and complications associated to the procedure, such as failure to decrease pain;  infection; bleeding; organ or nerve damage with subsequent damage to sensory, motor, and/or autonomic systems, resulting in permanent pain, numbness, and/or weakness of one or several areas of the body; allergic reactions; (i.e.: anaphylactic reaction); and/or death. ?Furthermore, the patient was informed of those risks and complications associated with the medications. These include, but are not limited to: allergic reactions (i.e.: anaphylactic or anaphylactoid reaction(s)); adrenal axis suppression; blood sugar elevation that in diabetics may result in ketoacidosis or comma; water retention that in patients with history of congestive heart failure may result in shortness of breath, pulmonary edema, and decompensation with resultant heart failure; weight gain; swelling or edema; medication-induced neural toxicity; particulate matter embolism and blood vessel occlusion with resultant organ, and/or nervous system infarction; and/or aseptic necrosis of one or more joints. ?Finally, the patient was informed that Medicine is not an exact science; therefore, there is also the possibility of unforeseen or unpredictable risks and/or possible complications that may result in a catastrophic outcome. The patient indicated having understood very clearly. We have given the patient no guarantees and we have made no promises. Enough time was given to the patient to ask questions, all of which were answered to the patient's satisfaction. Brianna Arnold has indicated that she wanted to continue with the procedure. ?Attestation: I, the ordering provider, attest that I have discussed with the patient the benefits, risks, side-effects, alternatives, likelihood of achieving goals, and potential problems during recovery for the procedure that I have provided informed consent. ?Date  Time: 08/30/2021  1:58 PM ? ?Pre-Procedure  Preparation:  ?Monitoring: As per clinic protocol. Respiration, ETCO2, SpO2, BP, heart rate and rhythm monitor placed and checked for adequate function ?Safety Precautions: Patient was assessed for positional comfort and pressure points before starting the procedure. ?Time-out: I initiated and conducted the "Time-out" before starting the procedure, as per protocol. The patient was asked to participate by confirming the accuracy of the "Time Out" information. Verification of the correct person, site, and procedure were performed and confirmed by me, the nursing staff, and the patient. "Time-out" conducted as per Joint Commission's Universal Protocol (UP.01.01.01). ?Time: 1437 ? ?Description of Procedure:          ?Area Prepped: Entire Lower Lumbosacral Region & Right cervical region ?DuraPrep (Iodine Povacrylex [0.7% available iodine] and Isopropyl Alcohol, 74% w/w) ?Safety Precautions: Aspiration looking for blood return was conducted prior to all injections. At no point did we inject any substances, as a needle was being advanced. No attempts were made at seeking any paresthesias. Safe injection practices and needle disposal techniques used. Medications properly checked for expiration dates. SDV (single dose vial) medications used. ?Description of the Procedure: Protocol guidelines were followed. The patient was placed in position over the fluoroscopy table. The target area was identified and the area prepped in the usual manner. Skin & deeper tissues infiltrated with local anesthetic. Appropriate amount of time allowed to pass for local anesthetics to take effect. The procedure needles were then advanced to the target area. Proper needle placement secured. Negative aspiration confirmed. Solution injected in intermittent fashion, asking for systemic symptoms every 0.5cc of injectate. The needles were then removed and the area cleansed, making sure to leave some of the prepping solution back to take advantage of its  long term bactericidal properties. ? ?Vitals:  ? 08/30/21 1410 08/30/21 1430  ?BP: 125/90 (!) 164/94  ?Pulse: (!) 104 (!) 105  ?Resp: 16 16  ?Temp: 97.9 ?F (36.6 ?C)   ?TempSrc: Temporal   ?SpO2: 100% 1

## 2021-09-20 ENCOUNTER — Ambulatory Visit
Payer: Medicare HMO | Attending: Student in an Organized Health Care Education/Training Program | Admitting: Student in an Organized Health Care Education/Training Program

## 2021-09-20 ENCOUNTER — Encounter: Payer: Self-pay | Admitting: Student in an Organized Health Care Education/Training Program

## 2021-09-20 VITALS — BP 152/99 | HR 88 | Temp 98.6°F | Resp 16 | Ht <= 58 in | Wt 156.0 lb

## 2021-09-20 DIAGNOSIS — G894 Chronic pain syndrome: Secondary | ICD-10-CM | POA: Insufficient documentation

## 2021-09-20 DIAGNOSIS — M4105 Infantile idiopathic scoliosis, thoracolumbar region: Secondary | ICD-10-CM | POA: Diagnosis not present

## 2021-09-20 DIAGNOSIS — M7918 Myalgia, other site: Secondary | ICD-10-CM | POA: Insufficient documentation

## 2021-09-20 MED ORDER — ROPIVACAINE HCL 2 MG/ML IJ SOLN
9.0000 mL | Freq: Once | INTRAMUSCULAR | Status: AC
  Administered 2021-09-20: 9 mL via PERINEURAL

## 2021-09-20 MED ORDER — DIAZEPAM 5 MG PO TABS
ORAL_TABLET | ORAL | Status: AC
  Filled 2021-09-20: qty 1

## 2021-09-20 MED ORDER — ROPIVACAINE HCL 2 MG/ML IJ SOLN
INTRAMUSCULAR | Status: AC
  Filled 2021-09-20: qty 20

## 2021-09-20 MED ORDER — DIAZEPAM 5 MG PO TABS
5.0000 mg | ORAL_TABLET | ORAL | Status: AC
  Administered 2021-09-20: 5 mg via ORAL

## 2021-09-20 NOTE — Progress Notes (Signed)
PROVIDER NOTE: Information contained herein reflects review and annotations entered in association with encounter. Interpretation of such information and data should be left to medically-trained personnel. Information provided to patient can be located elsewhere in the medical record under "Patient Instructions". Document created using STT-dictation technology, any transcriptional errors that may result from process are unintentional.    Patient: Brianna Arnold  Service Category: Procedure  Provider: Edward Jolly, MD  DOB: 1990-06-09  DOS: 09/20/2021  Location: [facility]  MRN: [id number]  Setting: Ambulatory - outpatient  Referring Provider: Marguarite Arbour, MD  Type: Established Patient  Specialty: Interventional Pain Management  PCP: Marguarite Arbour, MD   Primary Reason for Visit: Interventional Pain Management Treatment. CC: Neck Pain   Procedure:          Anesthesia, Analgesia, Anxiolysis:  Type: Lumbar Paraspinal Muscle Trigger Point Injection (3+)         Quadratus lumborum, erector spinae, multifidus and periscapular region and bilateral cervical/trapezius TPI CPT: 20553 Primary Purpose: Therapeutic Approach: Percutaneous, ipsilateral approach. Laterality: Midline        Type: Local Anesthesia with PO Valium  Position: Prone   Indications: 1. Infantile idiopathic scoliosis of thoracolumbar region   2. Myofascial pain syndrome of lumbar spine   3. Chronic pain syndrome       Pain Score: Pre-procedure: 6 /10 Post-procedure: 6 /10   Pre-op H&P Assessment:  Brianna Arnold is a 31 y.o. (year old), female patient, seen today for interventional treatment. She  has a past surgical history that includes Cervical spine surgery (07/2014); Back surgery (10/2014); and Anterior cervical decomp/discectomy fusion (N/A, 02/11/2019). Brianna Arnold has a current medication list which includes the following prescription(s): albuterol, baclofen, vitamin d3,  cyclobenzaprine, dicyclomine, ergocalciferol, famotidine, ferrous sulfate, gabapentin, medroxyprogesterone, ofloxacin, promethazine, propranolol er, tramadol, vitamin b-12, zolpidem, and trazodone. Her primarily concern today is the Neck Pain   Initial Vital Signs:  Pulse/HCG Rate: 88  Temp: 98.6 F (37 C) Resp: 16 BP: (!) 152/99 SpO2: 100 %  BMI: Estimated body mass index is 34.97 kg/m as calculated from the following:   Height as of this encounter: 4\' 8"  (1.422 m).   Weight as of this encounter: 156 lb (70.8 kg).  Risk Assessment: Allergies: Reviewed. She is allergic to hypafix [wound dressings], ativan [lorazepam], and tizanidine.  Allergy Precautions: None required Coagulopathies: Reviewed. None identified.  Blood-thinner therapy: None at this time Active Infection(s): Reviewed. None identified. Brianna Arnold is afebrile  Site Confirmation: Brianna Arnold was asked to confirm the procedure and laterality before marking the site Procedure checklist: Completed Consent: Before the procedure and under the influence of no sedative(s), amnesic(s), or anxiolytics, the patient was informed of the treatment options, risks and possible complications. To fulfill our ethical and legal obligations, as recommended by the American Medical Association's Code of Ethics, I have informed the patient of my clinical impression; the nature and purpose of the treatment or procedure; the risks, benefits, and possible complications of the intervention; the alternatives, including doing nothing; the risk(s) and benefit(s) of the alternative treatment(s) or procedure(s); and the risk(s) and benefit(s) of doing nothing. The patient was provided information about the general risks and possible complications associated with the procedure. These may include, but are not limited to: failure to achieve desired goals, infection, bleeding, organ or nerve damage, allergic reactions, paralysis, and death. In addition, the  patient was informed of those risks and complications associated to the procedure, such as failure to decrease pain; infection;  bleeding; organ or nerve damage with subsequent damage to sensory, motor, and/or autonomic systems, resulting in permanent pain, numbness, and/or weakness of one or several areas of the body; allergic reactions; (i.e.: anaphylactic reaction); and/or death. Furthermore, the patient was informed of those risks and complications associated with the medications. These include, but are not limited to: allergic reactions (i.e.: anaphylactic or anaphylactoid reaction(s)); adrenal axis suppression; blood sugar elevation that in diabetics may result in ketoacidosis or comma; water retention that in patients with history of congestive heart failure may result in shortness of breath, pulmonary edema, and decompensation with resultant heart failure; weight gain; swelling or edema; medication-induced neural toxicity; particulate matter embolism and blood vessel occlusion with resultant organ, and/or nervous system infarction; and/or aseptic necrosis of one or more joints. Finally, the patient was informed that Medicine is not an exact science; therefore, there is also the possibility of unforeseen or unpredictable risks and/or possible complications that may result in a catastrophic outcome. The patient indicated having understood very clearly. We have given the patient no guarantees and we have made no promises. Enough time was given to the patient to ask questions, all of which were answered to the patient's satisfaction. Brianna Arnold has indicated that she wanted to continue with the procedure. Attestation: I, the ordering provider, attest that I have discussed with the patient the benefits, risks, side-effects, alternatives, likelihood of achieving goals, and potential problems during recovery for the procedure that I have provided informed consent. Date  Time: 09/20/2021  2:09 PM  Pre-Procedure  Preparation:  Monitoring: As per clinic protocol. Respiration, ETCO2, SpO2, BP, heart rate and rhythm monitor placed and checked for adequate function Safety Precautions: Patient was assessed for positional comfort and pressure points before starting the procedure. Time-out: I initiated and conducted the "Time-out" before starting the procedure, as per protocol. The patient was asked to participate by confirming the accuracy of the "Time Out" information. Verification of the correct person, site, and procedure were performed and confirmed by me, the nursing staff, and the patient. "Time-out" conducted as per Joint Commission's Universal Protocol (UP.01.01.01). Time: 1436  Description of Procedure:          Area Prepped: Entire Lower Lumbosacral Region & Right cervical region DuraPrep (Iodine Povacrylex [0.7% available iodine] and Isopropyl Alcohol, 74% w/w) Safety Precautions: Aspiration looking for blood return was conducted prior to all injections. At no point did we inject any substances, as a needle was being advanced. No attempts were made at seeking any paresthesias. Safe injection practices and needle disposal techniques used. Medications properly checked for expiration dates. SDV (single dose vial) medications used. Description of the Procedure: Protocol guidelines were followed. The patient was placed in position over the fluoroscopy table. The target area was identified and the area prepped in the usual manner. Skin & deeper tissues infiltrated with local anesthetic. Appropriate amount of time allowed to pass for local anesthetics to take effect. The procedure needles were then advanced to the target area. Proper needle placement secured. Negative aspiration confirmed. Solution injected in intermittent fashion, asking for systemic symptoms every 0.5cc of injectate. The needles were then removed and the area cleansed, making sure to leave some of the prepping solution back to take advantage of its  long term bactericidal properties.  Vitals:   09/20/21 1414  BP: (!) 152/99  Pulse: 88  Resp: 16  Temp: 98.6 F (37 C)  TempSrc: Temporal  SpO2: 100%  Weight: 156 lb (70.8 kg)  Height: 4\' 8"  (1.422  m)       Start Time: 1436 hrs. End Time: 1443 hrs. Materials:  Needle(s) Type: Regular needle Gauge: 27G Length: 1.5-in  Medication(s): Please see orders for medications and dosing details.   12 trigger points were also injected in the right and left cervical, right and left trapezius region with dry needling performed.  Each trigger point was injected with 0.5 to 1 cc of 0.2% ropivacaine.   Post-operative Assessment:  Post-procedure Vital Signs:  Pulse/HCG Rate: 88  Temp: 98.6 F (37 C) Resp: 16 BP: (!) 152/99 SpO2: 100 %  EBL: None  Complications: No immediate post-treatment complications observed by team, or reported by patient.  Note: The patient tolerated the entire procedure well. A repeat set of vitals were taken after the procedure and the patient was kept under observation following institutional policy, for this type of procedure. Post-procedural neurological assessment was performed, showing return to baseline, prior to discharge. The patient was provided with post-procedure discharge instructions, including a section on how to identify potential problems. Should any problems arise concerning this procedure, the patient was given instructions to immediately contact us, at any time, without hesitation. In any case, we plan to contact the patient by telephone for a follow-up status report regarding this interventional procedure.  Comments:  No additional relevant information.  Plan of Care   Medications ordered for procedure: Meds ordered this encounter  Medications   ropivacaine (PF) 2 mg/mL (0.2%) (NAROPIN) injection 9 mL   diazepam (VALIUM) tablet 5 mg    Make sure Flumazenil is available in the pyxis when using this medication. If oversedation occurs,  administer 0.2 mg IV over 15 sec. If after 45 sec no response, administer 0.2 mg again over 1 min; may repeat at 1 min intervals; not to exceed 4 doses (1 mg)    Medications administered: We administered ropivacaine (PF) 2 mg/mL (0.2%) and diazepam.  See the medical record for exact dosing, route, and time of administration.  Follow-up plan:   Return for PRN.       Pharmacological management options:  Opioid Analgesics: Butrans patch, side effects at 7.5 mcg an hour, analgesic benefit with no side effects at 5 mcg an hour, continue  Membrane stabilizer: Continue gabapentin as prescribed  Muscle relaxant: Continue Flexeril as prescribed  NSAID: To be determined at a later time  Other analgesic(s): To be determined at a later time    Interventional management options: Ms. Kittrell was informed that there is no guarantee that she would be a candidate for interventional therapies. The decision will be based on the results of diagnostic studies, as well as Ms. Triska's risk profile.  Procedure(s) hx Left axillary peripheral nerve stimulation: 12/30/19 providing significant pain relief.  Removed 02/29/2020              Recent Visits Date Type Provider Dept  08/30/21 Procedure visit Edward Jolly, MD [facility]  08/07/21 Procedure visit Edward Jolly, MD [facility]  07/19/21 Office Visit Edward Jolly, MD [facility]  Showing recent visits within past 90 days and meeting all other requirements Today's Visits Date Type Provider Dept  09/20/21 Procedure visit Edward Jolly, MD [facility]  Showing today's visits and meeting all other requirements Future Appointments Date Type Provider Dept  10/03/21 Appointment Edward Jolly, MD [facility]  10/11/21 Appointment Edward Jolly, MD [facility]  Showing future appointments within next 90 days and meeting all other requirements  Disposition: Discharge home  Discharge (  Date  Time):  09/20/2021; 1443 hrs.   Primary Care Physician: Marguarite Arbour, MD Location: [facility] Note by: Edward Jolly, MD Date: 09/20/2021; Time: 2:49 PM  Disclaimer:  Medicine is not an exact science. The only guarantee in medicine is that nothing is guaranteed. It is important to note that the decision to proceed with this intervention was based on the information collected from the patient. The Data and conclusions were drawn from the patient's questionnaire, the interview, and the physical examination. Because the information was provided in large part by the patient, it cannot be guaranteed that it has not been purposely or unconsciously manipulated. Every effort has been made to obtain as much relevant data as possible for this evaluation. It is important to note that the conclusions that lead to this procedure are derived in large part from the available data. Always take into account that the treatment will also be dependent on availability of resources and existing treatment guidelines, considered by other Pain Management Practitioners as being common knowledge and practice, at the time of the intervention. For Medico-Legal purposes, it is also important to point out that variation in procedural techniques and pharmacological choices are the acceptable norm. The indications, contraindications, technique, and results of the above procedure should only be interpreted and judged by a Board-Certified Interventional Pain Specialist with extensive familiarity and expertise in the same exact procedure and technique.

## 2021-09-20 NOTE — Progress Notes (Signed)
Safety precautions to be maintained throughout the outpatient stay will include: orient to surroundings, keep bed in low position, maintain call bell within reach at all times, provide assistance with transfer out of bed and ambulation.  

## 2021-09-21 ENCOUNTER — Telehealth: Payer: Self-pay | Admitting: *Deleted

## 2021-09-21 NOTE — Telephone Encounter (Signed)
Called for post procedure check. Denies any problems. 

## 2021-10-03 ENCOUNTER — Ambulatory Visit
Payer: Medicare HMO | Attending: Student in an Organized Health Care Education/Training Program | Admitting: Student in an Organized Health Care Education/Training Program

## 2021-10-03 ENCOUNTER — Encounter: Payer: Self-pay | Admitting: Student in an Organized Health Care Education/Training Program

## 2021-10-03 VITALS — BP 114/76 | HR 102 | Temp 98.4°F | Resp 16 | Ht <= 58 in | Wt 157.0 lb

## 2021-10-03 DIAGNOSIS — G894 Chronic pain syndrome: Secondary | ICD-10-CM | POA: Insufficient documentation

## 2021-10-03 DIAGNOSIS — M4105 Infantile idiopathic scoliosis, thoracolumbar region: Secondary | ICD-10-CM | POA: Insufficient documentation

## 2021-10-03 DIAGNOSIS — M7918 Myalgia, other site: Secondary | ICD-10-CM | POA: Diagnosis present

## 2021-10-03 MED ORDER — BUPRENORPHINE 5 MCG/HR TD PTWK: 1.0000 | MEDICATED_PATCH | TRANSDERMAL | 3 refills | Status: DC

## 2021-10-03 NOTE — Progress Notes (Signed)
PROVIDER NOTE: Information contained herein reflects review and annotations entered in association with encounter. Interpretation of such information and data should be left to medically-trained personnel. Information provided to patient can be located elsewhere in the medical record under "Patient Instructions". Document created using STT-dictation technology, any transcriptional errors that may result from process are unintentional.    Patient: Brianna Arnold  Service Category: E/M  Provider: Gillis Santa, MD  DOB: 1991-04-13  DOS: 10/03/2021  Specialty: Interventional Pain Management  MRN: [id number]  Setting: Ambulatory outpatient  PCP: Idelle Crouch, MD  Type: Established Patient    Referring Provider: Idelle Crouch, MD  Location: Office  Delivery: Face-to-face     HPI  [name], a 31 y.o. year old female, is here today because of her Infantile idiopathic scoliosis of thoracolumbar region [M41.05]. [name]'s primary complain today is Back Pain Last encounter: My last encounter with her was on 09/20/2021. Pertinent problems: [name] has Status post cervical spinal fusion; Migraine without aura and without status migrainosus, not intractable; History of lumbar spinal fusion (T11- iliac); Cervicalgia; Chronic left shoulder pain; Disorder of left rotator cuff; and Chronic pain syndrome on their pertinent problem list. Pain Assessment: Severity of Chronic pain is reported as a 4 /10. Location: Back Mid/ . Onset: More than a month ago. Quality: Other (Comment) (twitching). Timing: Constant. Modifying factor(s): ice, meds, sleep. Vitals:  height is 4' 7.5" (1.41 m) and weight is 157 lb (71.2 kg). Her temporal temperature is 98.4 F (36.9 C). Her blood pressure is 114/76 and her pulse is 102 (abnormal). Her respiration is 16 and oxygen saturation is 100%.   Reason for encounter: medication management.  No change in medical history since last visit.  Patient's  pain is at baseline.  Patient continues multimodal pain regimen as prescribed.  States that it provides pain relief and improvement in functional status. Finds benefit with every 3 to every 4 week cervical, thoracic, lumbar TPI's.  Pharmacotherapy Assessment  Analgesic: Butrans 5 mcg/hr q 7days   Monitoring: [place] PMP: PDMP reviewed during this encounter.       Pharmacotherapy: No side-effects or adverse reactions reported. Compliance: No problems identified. Effectiveness: Clinically acceptable.  Rise Patience, RN  10/03/2021  9:01 AM  Sign when Signing Visit Nursing Pain Medication Assessment:  Safety precautions to be maintained throughout the outpatient stay will include: orient to surroundings, keep bed in low position, maintain call bell within reach at all times, provide assistance with transfer out of bed and ambulation.  Medication Inspection Compliance: Pill count conducted under aseptic conditions, in front of the patient. Neither the pills nor the bottle was removed from the patient's sight at any time. Once count was completed pills were immediately returned to the patient in their original bottle.  Medication: Buprenorphine (Suboxone) Pill/Patch Count:  01 of 04 patches remain Pill/Patch Appearance: Markings consistent with prescribed medication Bottle Appearance: Standard pharmacy container. Clearly labeled. Filled Date: 05 / 21 / 2023 Last Medication intake:   09/26/21  UDS:  Summary  Date Value Ref Range Status  10/15/2019 Note  Final    Comment:    ==================================================================== Compliance Drug Analysis, Ur ==================================================================== Test                             Result       Flag       Units  Drug Absent but Declared for Prescription Verification  Buprenorphine                  Not Detected UNEXPECTED ng/mg creat    Transdermal buprenorphine, as indicated in the declared medication     list, is not always detected even when used as directed.    Tramadol                       Not Detected UNEXPECTED ng/mg creat   Gabapentin                     Not Detected UNEXPECTED   Cyclobenzaprine                Not Detected UNEXPECTED   Trazodone                      Not Detected UNEXPECTED   Promethazine                   Not Detected UNEXPECTED ==================================================================== Test                      Result    Flag   Units      Ref Range   Creatinine              91               mg/dL      >=20 ==================================================================== Declared Medications:  The flagging and interpretation on this report are based on the  following declared medications.  Unexpected results may arise from  inaccuracies in the declared medications.   **Note: The testing scope of this panel includes these medications:   Cyclobenzaprine (Flexeril)  Gabapentin (Neurontin)  Promethazine (Phenergan)  Tramadol (Ultram)  Trazodone (Desyrel)   **Note: The testing scope of this panel does not include small to  moderate amounts of these reported medications:   Buprenorphine Patch (BuTrans)   **Note: The testing scope of this panel does not include the  following reported medications:   Albuterol (Ventolin HFA)  Ethinyl Estradiol (Sprintec)  Famotidine (Pepcid)  Iron  Norgestimate (Sprintec)  Vitamin B12  Vitamin D3 ==================================================================== For clinical consultation, please call [phone]. ====================================================================      ROS  Constitutional: Denies any fever or chills Gastrointestinal: No reported hemesis, hematochezia, vomiting, or acute GI distress Musculoskeletal:  Chronic cervical, thoracic, lumbar spine pain Neurological: No reported episodes of acute onset apraxia, aphasia, dysarthria, agnosia, amnesia, paralysis, loss of  coordination, or loss of consciousness  Medication Review  Vitamin D3, albuterol, baclofen, buprenorphine, cyclobenzaprine, dicyclomine, ergocalciferol, famotidine, ferrous sulfate, gabapentin, medroxyPROGESTERone, ofloxacin, promethazine, propranolol ER, traMADol, traZODone, vitamin B-12, and zolpidem  History Review  Allergy: [name] is allergic to hypafix [wound dressings], ativan [lorazepam], and tizanidine. Drug: [name]  reports no history of drug use. Alcohol:  has no history on file for alcohol use. Tobacco:  reports that she has never smoked. She has never used smokeless tobacco. Social: [name]  reports that she has never smoked. She has never used smokeless tobacco. She reports that she does not use drugs. Medical:  has a past medical history of Anemia and Jarcho-Levin syndrome. Surgical: [name]  has a past surgical history that includes Cervical spine surgery (07/2014); Back surgery (10/2014); and Anterior cervical decomp/discectomy fusion (N/A, 02/11/2019). Family: family history includes Hypertension in her father.  Laboratory Chemistry Profile   Renal Lab Results  Component Value Date  BUN 6 02/15/2019   CREATININE 0.63 02/15/2019   GFRAA >60 02/15/2019   GFRNONAA >60 02/15/2019    Hepatic Lab Results  Component Value Date   AST 17 02/14/2019   ALT 12 02/14/2019   ALBUMIN 3.9 02/14/2019   ALKPHOS 55 02/14/2019   LIPASE 18 02/14/2019    Electrolytes Lab Results  Component Value Date   NA 137 02/15/2019   K 4.1 02/15/2019   CL 106 02/15/2019   CALCIUM 8.1 (L) 02/15/2019    Bone No results found for: "VD25OH", "VD125OH2TOT", "ME2683MH9", "QQ2297LG9", "25OHVITD1", "25OHVITD2", "25OHVITD3", "TESTOFREE", "TESTOSTERONE"  Inflammation (CRP: Acute Phase) (ESR: Chronic Phase) Lab Results  Component Value Date   LATICACIDVEN 1.6 04/19/2015         Note: Above Lab results reviewed.  Recent Imaging Review  DG PAIN CLINIC C-ARM 1-60 MIN NO  REPORT Fluoro was used, but no Radiologist interpretation will be provided.  Please refer to "NOTES" tab for provider progress note. Note: Reviewed        Physical Exam  General appearance: Well nourished, well developed, and well hydrated. In no apparent acute distress Mental status: Alert, oriented x 3 (person, place, & time)       Respiratory: No evidence of acute respiratory distress Eyes: [name] Vitals: BP 114/76   Pulse (!) 102   Temp 98.4 F (36.9 C) (Temporal)   Resp 16   Ht 4' 7.5" (1.41 m)   Wt 157 lb (71.2 kg)   SpO2 100%   BMI 35.84 kg/m  BMI: Estimated body mass index is 35.84 kg/m as calculated from the following:   Height as of this encounter: 4' 7.5" (1.41 m).   Weight as of this encounter: 157 lb (71.2 kg). Ideal: Patient must be at least 60 in tall to calculate ideal body weight  Cervical Spine Exam  Skin & Axial Inspection: Well healed scar from previous spine surgery detected Alignment: Asymmetric Functional ROM: Pain restricted ROM      Stability: No instability detected Muscle Tone/Strength: Functionally intact. No obvious neuro-muscular anomalies detected. Sensory (Neurological): Neuropathic pain pattern Palpation: No palpable anomalies                 Thoracic Spine Area Exam  Skin & Axial Inspection: Well healed scar from previous spine surgery detected Alignment: Symmetrical Functional ROM: Mechanically restricted ROM Stability: No instability detected Muscle Tone/Strength: Functionally intact. No obvious neuro-muscular anomalies detected. Sensory (Neurological): Neurogenic pain pattern Muscle strength & Tone: No palpable anomalies   Lumbar Exam  Skin & Axial Inspection: Well healed scar from previous spine surgery detected Alignment: Scoliosis detected, significant Functional ROM: Pain restricted ROM       Stability: No instability detected Muscle Tone/Strength: Functionally intact. No obvious neuro-muscular anomalies detected. Sensory  (Neurological):  Musculoskeletal pain pattern Palpation: Complains of area being tender to palpation         Gait & Posture Assessment  Ambulation: Limited Gait: Antalgic Posture: Difficulty standing up straight, due to pain    Lower Extremity Exam      Side: Right lower extremity   Side: Left lower extremity  Stability: No instability observed           Stability: No instability observed          Skin & Extremity Inspection: Skin color, temperature, and hair growth are WNL. No peripheral edema or cyanosis. No masses, redness, swelling, asymmetry, or associated skin lesions. No contractures.   Skin & Extremity Inspection: Skin color, temperature, and hair  growth are WNL. No peripheral edema or cyanosis. No masses, redness, swelling, asymmetry, or associated skin lesions. No contractures.  Functional ROM: Pain restricted ROM                   Functional ROM: Pain restricted ROM                  Muscle Tone/Strength: Functionally intact. No obvious neuro-muscular anomalies detected.   Muscle Tone/Strength: Functionally intact. No obvious neuro-muscular anomalies detected.  Sensory (Neurological): Unimpaired         Sensory (Neurological): Unimpaired        DTR: Patellar: deferred today Achilles: deferred today Plantar: deferred today   DTR: Patellar: deferred today Achilles: deferred today Plantar: deferred today  Palpation: No palpable anomalies   Palpation: No palpable anomalies      Assessment   Diagnosis Status  1. Infantile idiopathic scoliosis of thoracolumbar region   2. Myofascial pain syndrome of lumbar spine   3. Chronic pain syndrome    Controlled Controlled Controlled     Plan of Care    [name] has a current medication list which includes the following long-term medication(s): albuterol, dicyclomine, famotidine, ferrous sulfate, gabapentin, medroxyprogesterone, promethazine, propranolol er, zolpidem, and trazodone.  Pharmacotherapy  (Medications Ordered): Meds ordered this encounter  Medications   buprenorphine (BUTRANS) 5 MCG/HR PTWK    Sig: Place 1 patch onto the skin once a week.    Dispense:  4 patch    Refill:  3    Chronic Pain: STOP Act (Not applicable) Fill 1 day early if closed on refill date. Avoid benzodiazepines within 8 hours of opioids     Follow-up plan:   Return in about 4 months (around 01/25/2022) for Medication Management, in person.     Pharmacological management options:  Opioid Analgesics: Butrans patch, side effects at 7.5 mcg an hour, analgesic benefit with no side effects at 5 mcg an hour, continue  Membrane stabilizer: Continue gabapentin as prescribed  Muscle relaxant: Continue Flexeril as prescribed  NSAID: To be determined at a later time  Other analgesic(s): To be determined at a later time    Interventional management options: [name] was informed that there is no guarantee that she would be a candidate for interventional therapies. The decision will be based on the results of diagnostic studies, as well as [name]'s risk profile.  Procedure(s) hx Left axillary peripheral nerve stimulation: 12/30/19 providing significant pain relief.  Removed 02/29/2020               Recent Visits Date Type Provider Dept  09/20/21 Procedure visit Gillis Santa, MD [facility]  08/30/21 Procedure visit Gillis Santa, MD [facility]  08/07/21 Procedure visit Gillis Santa, MD [facility]  07/19/21 Office Visit Gillis Santa, MD [facility]  Showing recent visits within past 90 days and meeting all other requirements Today's Visits Date Type Provider Dept  10/03/21 Office Visit Gillis Santa, MD [facility]  Showing today's visits and meeting all other requirements Future Appointments Date Type Provider Dept  10/11/21 Appointment Gillis Santa, MD [facility]  Showing future appointments within next 90 days and meeting all other  requirements  I discussed the assessment and treatment plan with the patient. The patient was provided an opportunity to ask questions and all were answered. The patient agreed with the plan and demonstrated an understanding of the instructions.  Patient advised to call back or seek  an in-person evaluation if the symptoms or condition worsens.  Duration of encounter: 75mnutes.  Note by: BGillis Santa MD Date: 10/03/2021; Time: 9:24 AM

## 2021-10-03 NOTE — Progress Notes (Signed)
Nursing Pain Medication Assessment:  Safety precautions to be maintained throughout the outpatient stay will include: orient to surroundings, keep bed in low position, maintain call bell within reach at all times, provide assistance with transfer out of bed and ambulation.  Medication Inspection Compliance: Pill count conducted under aseptic conditions, in front of the patient. Neither the pills nor the bottle was removed from the patient's sight at any time. Once count was completed pills were immediately returned to the patient in their original bottle.  Medication: Buprenorphine (Suboxone) Pill/Patch Count:  01 of 04 patches remain Pill/Patch Appearance: Markings consistent with prescribed medication Bottle Appearance: Standard pharmacy container. Clearly labeled. Filled Date: 05 / 21 / 2023 Last Medication intake:   09/26/21

## 2021-10-04 ENCOUNTER — Other Ambulatory Visit: Payer: Self-pay | Admitting: Student

## 2021-10-04 DIAGNOSIS — R519 Headache, unspecified: Secondary | ICD-10-CM

## 2021-10-11 ENCOUNTER — Ambulatory Visit
Payer: Medicare HMO | Attending: Student in an Organized Health Care Education/Training Program | Admitting: Student in an Organized Health Care Education/Training Program

## 2021-10-11 ENCOUNTER — Encounter: Payer: Self-pay | Admitting: Student in an Organized Health Care Education/Training Program

## 2021-10-11 ENCOUNTER — Other Ambulatory Visit: Payer: Self-pay

## 2021-10-11 VITALS — BP 124/86 | HR 106 | Temp 96.6°F | Ht <= 58 in | Wt 157.0 lb

## 2021-10-11 DIAGNOSIS — G894 Chronic pain syndrome: Secondary | ICD-10-CM | POA: Diagnosis not present

## 2021-10-11 DIAGNOSIS — M7918 Myalgia, other site: Secondary | ICD-10-CM | POA: Diagnosis not present

## 2021-10-11 DIAGNOSIS — M4105 Infantile idiopathic scoliosis, thoracolumbar region: Secondary | ICD-10-CM | POA: Diagnosis not present

## 2021-10-11 MED ORDER — ROPIVACAINE HCL 2 MG/ML IJ SOLN
9.0000 mL | Freq: Once | INTRAMUSCULAR | Status: AC
  Administered 2021-10-11: 9 mL via PERINEURAL

## 2021-10-11 MED ORDER — ROPIVACAINE HCL 2 MG/ML IJ SOLN
INTRAMUSCULAR | Status: AC
  Filled 2021-10-11: qty 20

## 2021-10-11 NOTE — Progress Notes (Signed)
PROVIDER NOTE: Information contained herein reflects review and annotations entered in association with encounter. Interpretation of such information and data should be left to medically-trained personnel. Information provided to patient can be located elsewhere in the medical record under "Patient Instructions". Document created using STT-dictation technology, any transcriptional errors that may result from process are unintentional.    Patient: Brianna Arnold  Service Category: Procedure  Provider: Edward Jolly, MD  DOB: 1990/11/30  DOS: 10/11/2021  Location: [facility]  MRN: [id number]  Setting: Ambulatory - outpatient  Referring Provider: Marguarite Arbour, MD  Type: Established Patient  Specialty: Interventional Pain Management  PCP: Marguarite Arbour, MD   Primary Reason for Visit: Interventional Pain Management Treatment. CC: Neck Pain   Procedure:          Anesthesia, Analgesia, Anxiolysis:  Type: Lumbar Paraspinal Muscle Trigger Point Injection (3+)         Quadratus lumborum, erector spinae, multifidus and periscapular region and bilateral cervical/trapezius TPI CPT: 20553 Primary Purpose: Therapeutic Approach: Percutaneous, ipsilateral approach. Laterality: Midline        Type: Local Anesthesia   Position: Prone   Indications: 1. Infantile idiopathic scoliosis of thoracolumbar region   2. Myofascial pain syndrome of lumbar spine   3. Chronic pain syndrome       Pain Score: Pre-procedure: 8 /10 Post-procedure: 8 /10   Pre-op H&P Assessment:  Brianna Arnold is a 31 y.o. (year old), female patient, seen today for interventional treatment. She  has a past surgical history that includes Cervical spine surgery (07/2014); Back surgery (10/2014); and Anterior cervical decomp/discectomy fusion (N/A, 02/11/2019). Brianna Arnold has a current medication list which includes the following prescription(s): albuterol, baclofen, buprenorphine, vitamin d3,  cyclobenzaprine, dicyclomine, ergocalciferol, famotidine, ferrous sulfate, gabapentin, medroxyprogesterone, ofloxacin, promethazine, propranolol er, tramadol, vitamin b-12, zolpidem, and trazodone, and the following Facility-Administered Medications: ropivacaine (pf) 2 mg/ml (0.2%) and ropivacaine (pf) 2 mg/ml (0.2%). Her primarily concern today is the Neck Pain   Initial Vital Signs:  Pulse/HCG Rate: (!) 106  Temp: (!) 96.6 F (35.9 C) Resp:   BP: 124/86 SpO2: 100 %  BMI: Estimated body mass index is 35.2 kg/m as calculated from the following:   Height as of this encounter: 4\' 8"  (1.422 m).   Weight as of this encounter: 157 lb (71.2 kg).  Risk Assessment: Allergies: Reviewed. She is allergic to hypafix [wound dressings], ativan [lorazepam], and tizanidine.  Allergy Precautions: None required Coagulopathies: Reviewed. None identified.  Blood-thinner therapy: None at this time Active Infection(s): Reviewed. None identified. [name] is afebrile  Site Confirmation: Brianna Arnold was asked to confirm the procedure and laterality before marking the site Procedure checklist: Completed Consent: Before the procedure and under the influence of no sedative(s), amnesic(s), or anxiolytics, the patient was informed of the treatment options, risks and possible complications. To fulfill our ethical and legal obligations, as recommended by the American Medical Association's Code of Ethics, I have informed the patient of my clinical impression; the nature and purpose of the treatment or procedure; the risks, benefits, and possible complications of the intervention; the alternatives, including doing nothing; the risk(s) and benefit(s) of the alternative treatment(s) or procedure(s); and the risk(s) and benefit(s) of doing nothing. The patient was provided information about the general risks and possible complications associated with the procedure. These may include, but are not limited to: failure to  achieve desired goals, infection, bleeding, organ or nerve damage, allergic reactions, paralysis, and death. In addition, the patient was  informed of those risks and complications associated to the procedure, such as failure to decrease pain; infection; bleeding; organ or nerve damage with subsequent damage to sensory, motor, and/or autonomic systems, resulting in permanent pain, numbness, and/or weakness of one or several areas of the body; allergic reactions; (i.e.: anaphylactic reaction); and/or death. Furthermore, the patient was informed of those risks and complications associated with the medications. These include, but are not limited to: allergic reactions (i.e.: anaphylactic or anaphylactoid reaction(s)); adrenal axis suppression; blood sugar elevation that in diabetics may result in ketoacidosis or comma; water retention that in patients with history of congestive heart failure may result in shortness of breath, pulmonary edema, and decompensation with resultant heart failure; weight gain; swelling or edema; medication-induced neural toxicity; particulate matter embolism and blood vessel occlusion with resultant organ, and/or nervous system infarction; and/or aseptic necrosis of one or more joints. Finally, the patient was informed that Medicine is not an exact science; therefore, there is also the possibility of unforeseen or unpredictable risks and/or possible complications that may result in a catastrophic outcome. The patient indicated having understood very clearly. We have given the patient no guarantees and we have made no promises. Enough time was given to the patient to ask questions, all of which were answered to the patient's satisfaction. Brianna Arnold has indicated that she wanted to continue with the procedure. Attestation: I, the ordering provider, attest that I have discussed with the patient the benefits, risks, side-effects, alternatives, likelihood of achieving goals, and potential  problems during recovery for the procedure that I have provided informed consent. Date  Time: 10/11/2021  1:49 PM  Pre-Procedure Preparation:  Monitoring: As per clinic protocol. Respiration, ETCO2, SpO2, BP, heart rate and rhythm monitor placed and checked for adequate function Safety Precautions: Patient was assessed for positional comfort and pressure points before starting the procedure. Time-out: I initiated and conducted the "Time-out" before starting the procedure, as per protocol. The patient was asked to participate by confirming the accuracy of the "Time Out" information. Verification of the correct person, site, and procedure were performed and confirmed by me, the nursing staff, and the patient. "Time-out" conducted as per Joint Commission's Universal Protocol (UP.01.01.01). Time: 1411  Description of Procedure:          Area Prepped: Entire Lower Lumbosacral Region & Right cervical region DuraPrep (Iodine Povacrylex [0.7% available iodine] and Isopropyl Alcohol, 74% w/w) Safety Precautions: Aspiration looking for blood return was conducted prior to all injections. At no point did we inject any substances, as a needle was being advanced. No attempts were made at seeking any paresthesias. Safe injection practices and needle disposal techniques used. Medications properly checked for expiration dates. SDV (single dose vial) medications used. Description of the Procedure: Protocol guidelines were followed. The patient was placed in position over the fluoroscopy table. The target area was identified and the area prepped in the usual manner. Skin & deeper tissues infiltrated with local anesthetic. Appropriate amount of time allowed to pass for local anesthetics to take effect. The procedure needles were then advanced to the target area. Proper needle placement secured. Negative aspiration confirmed. Solution injected in intermittent fashion, asking for systemic symptoms every 0.5cc of injectate.  The needles were then removed and the area cleansed, making sure to leave some of the prepping solution back to take advantage of its long term bactericidal properties.  Vitals:   10/11/21 1353  BP: 124/86  Pulse: (!) 106  Temp: (!) 96.6 F (35.9 C)  SpO2:  100%  Weight: 157 lb (71.2 kg)  Height: 4\' 8"  (1.422 m)       Start Time: 1411 hrs. End Time:   hrs. Materials:  Needle(s) Type: Regular needle Gauge: 27G Length: 1.5-in  Medication(s): Please see orders for medications and dosing details.   11 trigger points were also injected in the right and left cervical, right and left trapezius, & lumbar paraspinal region with dry needling performed.  Each trigger point was injected with 0.5 to 1 cc of 0.2% ropivacaine.   Post-operative Assessment:  Post-procedure Vital Signs:  Pulse/HCG Rate: (!) 106  Temp: (!) 96.6 F (35.9 C) Resp:   BP: 124/86 SpO2: 100 %  EBL: None  Complications: No immediate post-treatment complications observed by team, or reported by patient.  Note: The patient tolerated the entire procedure well. A repeat set of vitals were taken after the procedure and the patient was kept under observation following institutional policy, for this type of procedure. Post-procedural neurological assessment was performed, showing return to baseline, prior to discharge. The patient was provided with post-procedure discharge instructions, including a section on how to identify potential problems. Should any problems arise concerning this procedure, the patient was given instructions to immediately contact , at any time, without hesitation. In any case, we plan to contact the patient by telephone for a follow-up status report regarding this interventional procedure.  Comments:  No additional relevant information.  Plan of Care   Medications ordered for procedure: Meds ordered this encounter  Medications   ropivacaine (PF) 2 mg/mL (0.2%) (NAROPIN) injection 9 mL    ropivacaine (PF) 2 mg/mL (0.2%) (NAROPIN) injection 9 mL    Medications administered: [place]. Cu "Jasmine Pang" had no medications administered during this visit.  See the medical record for exact dosing, route, and time of administration.  Follow-up plan:   Return in about 3 weeks (around 11/01/2021) for TPI.       Pharmacological management options:  Opioid Analgesics: Butrans patch, side effects at 7.5 mcg an hour, analgesic benefit with no side effects at 5 mcg an hour, continue  Membrane stabilizer: Continue gabapentin as prescribed  Muscle relaxant: Continue Flexeril as prescribed  NSAID: To be determined at a later time  Other analgesic(s): To be determined at a later time    Interventional management options: Ms. Shultis was informed that there is no guarantee that she would be a candidate for interventional therapies. The decision will be based on the results of diagnostic studies, as well as Ms. Schweppe's risk profile.  Procedure(s) hx Left axillary peripheral nerve stimulation: 12/30/19 providing significant pain relief.  Removed 02/29/2020              Recent Visits Date Type Provider Dept  10/03/21 Office Visit 10/05/21, MD [facility]  09/20/21 Procedure visit 09/22/21, MD [facility]  08/30/21 Procedure visit 10/30/21, MD [facility]  08/07/21 Procedure visit 08/09/21, MD [facility]  07/19/21 Office Visit 07/21/21, MD [facility]  Showing recent visits within past 90 days and meeting all other requirements Today's Visits Date Type Provider Dept  10/11/21 Procedure visit 10/13/21, MD [facility]  Showing today's visits and meeting all other requirements Future Appointments Date Type Provider Dept  11/08/21 Appointment 11/10/21, MD [facility]  Showing future appointments within next 90 days and meeting all other requirements  Disposition: Discharge home   Discharge (Date  Time): 10/11/2021; 1422 hrs.   Primary  Care Physician: Marguarite Arbour, MD Location: [facility] Note by: Edward Jolly, MD Date: 10/11/2021; Time: 2:30 PM  Disclaimer:  Medicine is not an exact science. The only guarantee in medicine is that nothing is guaranteed. It is important to note that the decision to proceed with this intervention was based on the information collected from the patient. The Data and conclusions were drawn from the patient's questionnaire, the interview, and the physical examination. Because the information was provided in large part by the patient, it cannot be guaranteed that it has not been purposely or unconsciously manipulated. Every effort has been made to obtain as much relevant data as possible for this evaluation. It is important to note that the conclusions that lead to this procedure are derived in large part from the available data. Always take into account that the treatment will also be dependent on availability of resources and existing treatment guidelines, considered by other Pain Management Practitioners as being common knowledge and practice, at the time of the intervention. For Medico-Legal purposes, it is also important to point out that variation in procedural techniques and pharmacological choices are the acceptable norm. The indications, contraindications, technique, and results of the above procedure should only be interpreted and judged by a Board-Certified Interventional Pain Specialist with extensive familiarity and expertise in the same exact procedure and technique.

## 2021-10-12 ENCOUNTER — Telehealth: Payer: Self-pay

## 2021-10-12 NOTE — Telephone Encounter (Signed)
Post procedure phone call.  Patient states she is doing well.  

## 2021-10-20 ENCOUNTER — Ambulatory Visit
Admission: RE | Admit: 2021-10-20 | Discharge: 2021-10-20 | Disposition: A | Discharge status: Home / Self Care | Payer: Medicare HMO | Source: Ambulatory Visit | Attending: Student | Admitting: Student

## 2021-10-20 DIAGNOSIS — R519 Headache, unspecified: Secondary | ICD-10-CM

## 2021-11-02 ENCOUNTER — Other Ambulatory Visit: Payer: Self-pay | Admitting: Neurology

## 2021-11-02 ENCOUNTER — Encounter: Payer: Medicare HMO | Admitting: Student in an Organized Health Care Education/Training Program

## 2021-11-02 DIAGNOSIS — G9589 Other specified diseases of spinal cord: Secondary | ICD-10-CM

## 2021-11-08 ENCOUNTER — Encounter: Payer: Self-pay | Admitting: Student in an Organized Health Care Education/Training Program

## 2021-11-08 ENCOUNTER — Ambulatory Visit
Payer: Medicare HMO | Attending: Student in an Organized Health Care Education/Training Program | Admitting: Student in an Organized Health Care Education/Training Program

## 2021-11-08 VITALS — BP 143/118 | HR 100 | Temp 98.6°F | Resp 16 | Ht <= 58 in | Wt 157.0 lb

## 2021-11-08 DIAGNOSIS — M7918 Myalgia, other site: Secondary | ICD-10-CM | POA: Diagnosis present

## 2021-11-08 DIAGNOSIS — G894 Chronic pain syndrome: Secondary | ICD-10-CM | POA: Insufficient documentation

## 2021-11-08 DIAGNOSIS — M4105 Infantile idiopathic scoliosis, thoracolumbar region: Secondary | ICD-10-CM | POA: Diagnosis not present

## 2021-11-08 MED ORDER — DIAZEPAM 5 MG PO TABS
5.0000 mg | ORAL_TABLET | ORAL | Status: AC
  Administered 2021-11-08: 5 mg via ORAL

## 2021-11-08 MED ORDER — ROPIVACAINE HCL 2 MG/ML IJ SOLN
18.0000 mL | Freq: Once | INTRAMUSCULAR | Status: AC
  Administered 2021-11-08: 18 mL via PERINEURAL

## 2021-11-08 MED ORDER — ROPIVACAINE HCL 2 MG/ML IJ SOLN
INTRAMUSCULAR | Status: AC
  Filled 2021-11-08: qty 20

## 2021-11-08 MED ORDER — DIAZEPAM 5 MG PO TABS
ORAL_TABLET | ORAL | Status: AC
  Filled 2021-11-08: qty 1

## 2021-11-08 NOTE — Progress Notes (Signed)
PROVIDER NOTE: Information contained herein reflects review and annotations entered in association with encounter. Interpretation of such information and data should be left to medically-trained personnel. Information provided to patient can be located elsewhere in the medical record under "Patient Instructions". Document created using STT-dictation technology, any transcriptional errors that may result from process are unintentional.    Patient: Brianna Arnold  Service Category: Procedure  Provider: Edward Jolly, MD  DOB: [birth year]  DOS: 11/08/2021  Location: [facility]  MRN: [id number]  Setting: Ambulatory - outpatient  Referring Provider: Marguarite Arbour, MD  Type: Established Patient  Specialty: Interventional Pain Management  PCP: Marguarite Arbour, MD   Primary Reason for Visit: Interventional Pain Management Treatment. CC: Neck Pain and Pain (rib)   Procedure:          Anesthesia, Analgesia, Anxiolysis:  Type: Lumbar Paraspinal Muscle Trigger Point Injection (3+)         Quadratus lumborum, erector spinae, multifidus and periscapular region and bilateral cervical/trapezius TPI CPT: 20553 Primary Purpose: Therapeutic Approach: Percutaneous, ipsilateral approach. Laterality: Midline        Type: Local Anesthesia   Position: Prone   Indications: 1. Infantile idiopathic scoliosis of thoracolumbar region   2. Myofascial pain syndrome of lumbar spine   3. Chronic pain syndrome       Pain Score: Pre-procedure: 6 /10 Post-procedure: 6 /10   Pre-op H&P Assessment:  [name] is a 31 y.o. (year old), female patient, seen today for interventional treatment. She  has a past surgical history that includes Cervical spine surgery (07/2014); Back surgery (10/2014); and Anterior cervical decomp/discectomy fusion (N/A, 02/11/2019). [name] has a current medication list which includes the following prescription(s): albuterol, baclofen, buprenorphine, vitamin d3,  cyclobenzaprine, dicyclomine, ergocalciferol, famotidine, ferrous sulfate, gabapentin, medroxyprogesterone, ofloxacin, promethazine, propranolol er, tramadol, vitamin b-12, zolpidem, and trazodone. Her primarily concern today is the Neck Pain and Pain (rib)   Initial Vital Signs:  Pulse/HCG Rate: 98  Temp: 98.6 F (37 C) Resp: 18 BP: (!) 142/99 SpO2: 99 %  BMI: Estimated body mass index is 35.84 kg/m as calculated from the following:   Height as of this encounter: 4' 7.5" (1.41 m).   Weight as of this encounter: 157 lb (71.2 kg).  Risk Assessment: Allergies: Reviewed. She is allergic to hypafix [wound dressings], ativan [lorazepam], and tizanidine.  Allergy Precautions: None required Coagulopathies: Reviewed. None identified.  Blood-thinner therapy: None at this time Active Infection(s): Reviewed. None identified. [name] is afebrile  Site Confirmation: Ms. Wish was asked to confirm the procedure and laterality before marking the site Procedure checklist: Completed Consent: Before the procedure and under the influence of no sedative(s), amnesic(s), or anxiolytics, the patient was informed of the treatment options, risks and possible complications. To fulfill our ethical and legal obligations, as recommended by the American Medical Association's Code of Ethics, I have informed the patient of my clinical impression; the nature and purpose of the treatment or procedure; the risks, benefits, and possible complications of the intervention; the alternatives, including doing nothing; the risk(s) and benefit(s) of the alternative treatment(s) or procedure(s); and the risk(s) and benefit(s) of doing nothing. The patient was provided information about the general risks and possible complications associated with the procedure. These may include, but are not limited to: failure to achieve desired goals, infection, bleeding, organ or nerve damage, allergic reactions, paralysis, and death. In  addition, the patient was informed of those risks and complications associated to the procedure, such as  failure to decrease pain; infection; bleeding; organ or nerve damage with subsequent damage to sensory, motor, and/or autonomic systems, resulting in permanent pain, numbness, and/or weakness of one or several areas of the body; allergic reactions; (i.e.: anaphylactic reaction); and/or death. Furthermore, the patient was informed of those risks and complications associated with the medications. These include, but are not limited to: allergic reactions (i.e.: anaphylactic or anaphylactoid reaction(s)); adrenal axis suppression; blood sugar elevation that in diabetics may result in ketoacidosis or comma; water retention that in patients with history of congestive heart failure may result in shortness of breath, pulmonary edema, and decompensation with resultant heart failure; weight gain; swelling or edema; medication-induced neural toxicity; particulate matter embolism and blood vessel occlusion with resultant organ, and/or nervous system infarction; and/or aseptic necrosis of one or more joints. Finally, the patient was informed that Medicine is not an exact science; therefore, there is also the possibility of unforeseen or unpredictable risks and/or possible complications that may result in a catastrophic outcome. The patient indicated having understood very clearly. We have given the patient no guarantees and we have made no promises. Enough time was given to the patient to ask questions, all of which were answered to the patient's satisfaction. [name] has indicated that she wanted to continue with the procedure. Attestation: I, the ordering provider, attest that I have discussed with the patient the benefits, risks, side-effects, alternatives, likelihood of achieving goals, and potential problems during recovery for the procedure that I have provided informed consent. Date  Time: 11/08/2021  2:06  PM  Pre-Procedure Preparation:  Monitoring: As per clinic protocol. Respiration, ETCO2, SpO2, BP, heart rate and rhythm monitor placed and checked for adequate function Safety Precautions: Patient was assessed for positional comfort and pressure points before starting the procedure. Time-out: I initiated and conducted the "Time-out" before starting the procedure, as per protocol. The patient was asked to participate by confirming the accuracy of the "Time Out" information. Verification of the correct person, site, and procedure were performed and confirmed by me, the nursing staff, and the patient. "Time-out" conducted as per Joint Commission's Universal Protocol (UP.01.01.01). Time: 1431  Description of Procedure:          Area Prepped: Entire Lower Lumbosacral Region & Right cervical region DuraPrep (Iodine Povacrylex [0.7% available iodine] and Isopropyl Alcohol, 74% w/w) Safety Precautions: Aspiration looking for blood return was conducted prior to all injections. At no point did we inject any substances, as a needle was being advanced. No attempts were made at seeking any paresthesias. Safe injection practices and needle disposal techniques used. Medications properly checked for expiration dates. SDV (single dose vial) medications used. Description of the Procedure: Protocol guidelines were followed. The patient was placed in position over the fluoroscopy table. The target area was identified and the area prepped in the usual manner. Skin & deeper tissues infiltrated with local anesthetic. Appropriate amount of time allowed to pass for local anesthetics to take effect. The procedure needles were then advanced to the target area. Proper needle placement secured. Negative aspiration confirmed. Solution injected in intermittent fashion, asking for systemic symptoms every 0.5cc of injectate. The needles were then removed and the area cleansed, making sure to leave some of the prepping solution back to  take advantage of its long term bactericidal properties.  Vitals:   11/08/21 1416 11/08/21 1429 11/08/21 1435  BP: (!) 142/99 (!) 143/118   Pulse: 98 (!) 106 100  Resp: 18 18 16   Temp: 98.6 F (37 C)  TempSrc: Temporal    SpO2: 99% 100% 100%  Weight: 157 lb (71.2 kg)    Height: 4' 7.5" (1.41 m)         Start Time: 1431 hrs. End Time: 1435 hrs. Materials:  Needle(s) Type: Regular needle Gauge: 27G Length: 1.5-in  Medication(s): Please see orders for medications and dosing details.   18 trigger points were also injected in the right and left cervical, right and left trapezius, & lumbar paraspinal region with dry needling performed.  Each trigger point was injected with 0.5 to 1 cc of 0.2% ropivacaine.   Post-operative Assessment:  Post-procedure Vital Signs:  Pulse/HCG Rate: 100  Temp: 98.6 F (37 C) Resp: 16 BP: (!) 143/118 SpO2: 100 %  EBL: None  Complications: No immediate post-treatment complications observed by team, or reported by patient.  Note: The patient tolerated the entire procedure well. A repeat set of vitals were taken after the procedure and the patient was kept under observation following institutional policy, for this type of procedure. Post-procedural neurological assessment was performed, showing return to baseline, prior to discharge. The patient was provided with post-procedure discharge instructions, including a section on how to identify potential problems. Should any problems arise concerning this procedure, the patient was given instructions to immediately contact us, at any time, without hesitation. In any case, we plan to contact the patient by telephone for a follow-up status report regarding this interventional procedure.  Comments:  No additional relevant information.  Plan of Care   Medications ordered for procedure: Meds ordered this encounter  Medications   ropivacaine (PF) 2 mg/mL (0.2%) (NAROPIN) injection 18 mL   diazepam  (VALIUM) tablet 5 mg    Make sure Flumazenil is available in the pyxis when using this medication. If oversedation occurs, administer 0.2 mg IV over 15 sec. If after 45 sec no response, administer 0.2 mg again over 1 min; may repeat at 1 min intervals; not to exceed 4 doses (1 mg)    Medications administered: We administered ropivacaine (PF) 2 mg/mL (0.2%) and diazepam.  See the medical record for exact dosing, route, and time of administration.  Follow-up plan:   No follow-ups on file.       Pharmacological management options:  Opioid Analgesics: Butrans patch, side effects at 7.5 mcg an hour, analgesic benefit with no side effects at 5 mcg an hour, continue  Membrane stabilizer: Continue gabapentin as prescribed  Muscle relaxant: Continue Flexeril as prescribed  NSAID: To be determined at a later time  Other analgesic(s): To be determined at a later time    Interventional management options: Ms. Pfannenstiel was informed that there is no guarantee that she would be a candidate for interventional therapies. The decision will be based on the results of diagnostic studies, as well as Ms. Barth's risk profile.  Procedure(s) hx Left axillary peripheral nerve stimulation: 12/30/19 providing significant pain relief.  Removed 02/29/2020              Recent Visits Date Type Provider Dept  10/11/21 Procedure visit Edward Jolly, MD [facility]  10/03/21 Office Visit Edward Jolly, MD [facility]  09/20/21 Procedure visit Edward Jolly, MD [facility]  08/30/21 Procedure visit Edward Jolly, MD [facility]  Showing recent visits within past 90 days and meeting all other requirements Today's Visits Date Type Provider Dept  11/08/21 Procedure visit Edward Jolly, MD [facility]  Showing today's visits and meeting all other requirements Future Appointments Date Type Provider  Dept  12/06/21 Appointment Edward Jolly, MD [facility]   01/16/22 Appointment Edward Jolly, MD [facility]  Showing future appointments within next 90 days and meeting all other requirements  Disposition: Discharge home  Discharge (Date  Time): 11/08/2021; 1445 hrs.   Primary Care Physician: Marguarite Arbour, MD Location: [facility] Note by: Edward Jolly, MD Date: 11/08/2021; Time: 3:14 PM  Disclaimer:  Medicine is not an exact science. The only guarantee in medicine is that nothing is guaranteed. It is important to note that the decision to proceed with this intervention was based on the information collected from the patient. The Data and conclusions were drawn from the patient's questionnaire, the interview, and the physical examination. Because the information was provided in large part by the patient, it cannot be guaranteed that it has not been purposely or unconsciously manipulated. Every effort has been made to obtain as much relevant data as possible for this evaluation. It is important to note that the conclusions that lead to this procedure are derived in large part from the available data. Always take into account that the treatment will also be dependent on availability of resources and existing treatment guidelines, considered by other Pain Management Practitioners as being common knowledge and practice, at the time of the intervention. For Medico-Legal purposes, it is also important to point out that variation in procedural techniques and pharmacological choices are the acceptable norm. The indications, contraindications, technique, and results of the above procedure should only be interpreted and judged by a Board-Certified Interventional Pain Specialist with extensive familiarity and expertise in the same exact procedure and technique.

## 2021-11-08 NOTE — Progress Notes (Signed)
Safety precautions to be maintained throughout the outpatient stay will include: orient to surroundings, keep bed in low position, maintain call bell within reach at all times, provide assistance with transfer out of bed and ambulation.  

## 2021-11-09 ENCOUNTER — Telehealth: Payer: Self-pay

## 2021-11-09 NOTE — Telephone Encounter (Signed)
Post procedure phone call.  LM 

## 2021-11-13 ENCOUNTER — Ambulatory Visit
Admission: RE | Admit: 2021-11-13 | Discharge: 2021-11-13 | Disposition: A | Discharge status: Home / Self Care | Payer: Medicare HMO | Source: Ambulatory Visit | Attending: Neurology | Admitting: Neurology

## 2021-11-13 DIAGNOSIS — G9589 Other specified diseases of spinal cord: Secondary | ICD-10-CM | POA: Diagnosis present

## 2021-12-06 ENCOUNTER — Encounter: Payer: Self-pay | Admitting: Student in an Organized Health Care Education/Training Program

## 2021-12-06 ENCOUNTER — Ambulatory Visit
Payer: Medicare HMO | Attending: Student in an Organized Health Care Education/Training Program | Admitting: Student in an Organized Health Care Education/Training Program

## 2021-12-06 VITALS — BP 141/95 | HR 86 | Temp 97.9°F | Resp 16 | Ht <= 58 in | Wt 158.0 lb

## 2021-12-06 DIAGNOSIS — M4105 Infantile idiopathic scoliosis, thoracolumbar region: Secondary | ICD-10-CM | POA: Diagnosis present

## 2021-12-06 DIAGNOSIS — M7918 Myalgia, other site: Secondary | ICD-10-CM | POA: Diagnosis present

## 2021-12-06 DIAGNOSIS — G894 Chronic pain syndrome: Secondary | ICD-10-CM | POA: Diagnosis present

## 2021-12-06 MED ORDER — DIAZEPAM 5 MG PO TABS
5.0000 mg | ORAL_TABLET | ORAL | Status: AC
  Administered 2021-12-06: 5 mg via ORAL

## 2021-12-06 MED ORDER — DIAZEPAM 5 MG PO TABS
ORAL_TABLET | ORAL | Status: AC
  Filled 2021-12-06: qty 1

## 2021-12-06 MED ORDER — ROPIVACAINE HCL 2 MG/ML IJ SOLN
9.0000 mL | Freq: Once | INTRAMUSCULAR | Status: AC
Start: 2021-12-06 — End: 2021-12-06
  Administered 2021-12-06: 9 mL via PERINEURAL

## 2021-12-06 MED ORDER — ROPIVACAINE HCL 2 MG/ML IJ SOLN
INTRAMUSCULAR | Status: AC
  Filled 2021-12-06: qty 20

## 2021-12-06 NOTE — Progress Notes (Signed)
Safety precautions to be maintained throughout the outpatient stay will include: orient to surroundings, keep bed in low position, maintain call bell within reach at all times, provide assistance with transfer out of bed and ambulation.  

## 2021-12-06 NOTE — Patient Instructions (Signed)

## 2021-12-06 NOTE — Progress Notes (Signed)
PROVIDER NOTE: Information contained herein reflects review and annotations entered in association with encounter. Interpretation of such information and data should be left to medically-trained personnel. Information provided to patient can be located elsewhere in the medical record under "Patient Instructions". Document created using STT-dictation technology, any transcriptional errors that may result from process are unintentional.    Patient: Brianna Arnold  Service Category: Procedure  Provider: Edward Jolly, MD  DOB: 04/06/1991  DOS: 12/06/2021  Location: [facility]  MRN: [id number]  Setting: Ambulatory - outpatient  Referring Provider: Marguarite Arbour, MD  Type: Established Patient  Specialty: Interventional Pain Management  PCP: Marguarite Arbour, MD   Primary Reason for Visit: Interventional Pain Management Treatment. CC: Neck Pain and Back Pain (lower)   Procedure:          Anesthesia, Analgesia, Anxiolysis:  Type: Lumbar Paraspinal Muscle Trigger Point Injection (3+)         Quadratus lumborum, erector spinae, multifidus and periscapular region and bilateral cervical/trapezius TPI CPT: 20553 Primary Purpose: Therapeutic Approach: Percutaneous, ipsilateral approach. Laterality: Midline        Type: Local Anesthesia + 5 mg Po Valium  Position: Prone   Indications: 1. Infantile idiopathic scoliosis of thoracolumbar region   2. Myofascial pain syndrome of lumbar spine   3. Chronic pain syndrome       Pain Score: Pre-procedure: 8 /10 Post-procedure: 3  (moving, putting on clothes)/10   Pre-op H&P Assessment:  Brianna Arnold is a 31 y.o. (year old), female patient, seen today for interventional treatment. She  has a past surgical history that includes Cervical spine surgery (07/2014); Back surgery (10/2014); and Anterior cervical decomp/discectomy fusion (N/A, 02/11/2019). Brianna Arnold has a current medication list which includes the following  prescription(s): albuterol, baclofen, buprenorphine, vitamin d3, cyclobenzaprine, dicyclomine, ergocalciferol, famotidine, ferrous sulfate, gabapentin, medroxyprogesterone, ofloxacin, promethazine, propranolol er, tramadol, vitamin b-12, zolpidem, and trazodone. Her primarily concern today is the Neck Pain and Back Pain (lower)   Initial Vital Signs:  Pulse/HCG Rate: 86  Temp: 97.9 F (36.6 C) Resp: 16 BP: (!) 141/95 SpO2: 99 %  BMI: Estimated body mass index is 36.06 kg/m as calculated from the following:   Height as of this encounter: 4' 7.5" (1.41 m).   Weight as of this encounter: 158 lb (71.7 kg).  Risk Assessment: Allergies: Reviewed. She is allergic to hypafix [wound dressings], ativan [lorazepam], and tizanidine.  Allergy Precautions: None required Coagulopathies: Reviewed. None identified.  Blood-thinner therapy: None at this time Active Infection(s): Reviewed. None identified. Brianna Arnold is afebrile  Site Confirmation: Brianna Arnold was asked to confirm the procedure and laterality before marking the site Procedure checklist: Completed Consent: Before the procedure and under the influence of no sedative(s), amnesic(s), or anxiolytics, the patient was informed of the treatment options, risks and possible complications. To fulfill our ethical and legal obligations, as recommended by the American Medical Association's Code of Ethics, I have informed the patient of my clinical impression; the nature and purpose of the treatment or procedure; the risks, benefits, and possible complications of the intervention; the alternatives, including doing nothing; the risk(s) and benefit(s) of the alternative treatment(s) or procedure(s); and the risk(s) and benefit(s) of doing nothing. The patient was provided information about the general risks and possible complications associated with the procedure. These may include, but are not limited to: failure to achieve desired goals, infection, bleeding,  organ or nerve damage, allergic reactions, paralysis, and death. In addition, the patient was informed of  those risks and complications associated to the procedure, such as failure to decrease pain; infection; bleeding; organ or nerve damage with subsequent damage to sensory, motor, and/or autonomic systems, resulting in permanent pain, numbness, and/or weakness of one or several areas of the body; allergic reactions; (i.e.: anaphylactic reaction); and/or death. Furthermore, the patient was informed of those risks and complications associated with the medications. These include, but are not limited to: allergic reactions (i.e.: anaphylactic or anaphylactoid reaction(s)); adrenal axis suppression; blood sugar elevation that in diabetics may result in ketoacidosis or comma; water retention that in patients with history of congestive heart failure may result in shortness of breath, pulmonary edema, and decompensation with resultant heart failure; weight gain; swelling or edema; medication-induced neural toxicity; particulate matter embolism and blood vessel occlusion with resultant organ, and/or nervous system infarction; and/or aseptic necrosis of one or more joints. Finally, the patient was informed that Medicine is not an exact science; therefore, there is also the possibility of unforeseen or unpredictable risks and/or possible complications that may result in a catastrophic outcome. The patient indicated having understood very clearly. We have given the patient no guarantees and we have made no promises. Enough time was given to the patient to ask questions, all of which were answered to the patient's satisfaction. Brianna Arnold has indicated that she wanted to continue with the procedure. Attestation: I, the ordering provider, attest that I have discussed with the patient the benefits, risks, side-effects, alternatives, likelihood of achieving goals, and potential problems during recovery for the procedure that  I have provided informed consent. Date  Time: 12/06/2021  1:56 PM  Pre-Procedure Preparation:  Monitoring: As per clinic protocol. Respiration, ETCO2, SpO2, BP, heart rate and rhythm monitor placed and checked for adequate function Safety Precautions: Patient was assessed for positional comfort and pressure points before starting the procedure. Time-out: I initiated and conducted the "Time-out" before starting the procedure, as per protocol. The patient was asked to participate by confirming the accuracy of the "Time Out" information. Verification of the correct person, site, and procedure were performed and confirmed by me, the nursing staff, and the patient. "Time-out" conducted as per Joint Commission's Universal Protocol (UP.01.01.01). Time: 1414  Description of Procedure:          Area Prepped: Entire Lower Lumbosacral Region & Right cervical region DuraPrep (Iodine Povacrylex [0.7% available iodine] and Isopropyl Alcohol, 74% w/w) Safety Precautions: Aspiration looking for blood return was conducted prior to all injections. At no point did we inject any substances, as a needle was being advanced. No attempts were made at seeking any paresthesias. Safe injection practices and needle disposal techniques used. Medications properly checked for expiration dates. SDV (single dose vial) medications used. Description of the Procedure: Protocol guidelines were followed. The patient was placed in position over the fluoroscopy table. The target area was identified and the area prepped in the usual manner. Skin & deeper tissues infiltrated with local anesthetic. Appropriate amount of time allowed to pass for local anesthetics to take effect. The procedure needles were then advanced to the target area. Proper needle placement secured. Negative aspiration confirmed. Solution injected in intermittent fashion, asking for systemic symptoms every 0.5cc of injectate. The needles were then removed and the area  cleansed, making sure to leave some of the prepping solution back to take advantage of its long term bactericidal properties.  Vitals:   12/06/21 1402  BP: (!) 141/95  Pulse: 86  Resp: 16  Temp: 97.9 F (36.6 C)  TempSrc:  Temporal  SpO2: 99%  Weight: 158 lb (71.7 kg)  Height: 4' 7.5" (1.41 m)       Start Time: 1414 hrs. End Time: 1420 hrs. Materials:  Needle(s) Type: Regular needle Gauge: 27G Length: 1.5-in  Medication(s): Please see orders for medications and dosing details.   18 trigger points were also injected in the right and left cervical, right and left trapezius, & lumbar paraspinal region with dry needling performed.  Each trigger point was injected with 0.5 to 1 cc of 0.2% ropivacaine.   Post-operative Assessment:  Post-procedure Vital Signs:  Pulse/HCG Rate: 86  Temp: 97.9 F (36.6 C) Resp: 16 BP: (!) 141/95 SpO2: 99 %  EBL: None  Complications: No immediate post-treatment complications observed by team, or reported by patient.  Note: The patient tolerated the entire procedure well. A repeat set of vitals were taken after the procedure and the patient was kept under observation following institutional policy, for this type of procedure. Post-procedural neurological assessment was performed, showing return to baseline, prior to discharge. The patient was provided with post-procedure discharge instructions, including a section on how to identify potential problems. Should any problems arise concerning this procedure, the patient was given instructions to immediately contact us, at any time, without hesitation. In any case, we plan to contact the patient by telephone for a follow-up status report regarding this interventional procedure.  Comments:  No additional relevant information.  Plan of Care   Medications ordered for procedure: Meds ordered this encounter  Medications   diazepam (VALIUM) tablet 5 mg    Make sure Flumazenil is available in the pyxis  when using this medication. If oversedation occurs, administer 0.2 mg IV over 15 sec. If after 45 sec no response, administer 0.2 mg again over 1 min; may repeat at 1 min intervals; not to exceed 4 doses (1 mg)   ropivacaine (PF) 2 mg/mL (0.2%) (NAROPIN) injection 9 mL    Medications administered: We administered diazepam and ropivacaine (PF) 2 mg/mL (0.2%).  See the medical record for exact dosing, route, and time of administration.  Follow-up plan:   No follow-ups on file.       Pharmacological management options:  Opioid Analgesics: Butrans patch, side effects at 7.5 mcg an hour, analgesic benefit with no side effects at 5 mcg an hour, continue  Membrane stabilizer: Continue gabapentin as prescribed  Muscle relaxant: Continue Flexeril as prescribed  NSAID: To be determined at a later time  Other analgesic(s): To be determined at a later time    Interventional management options: Ms. Orzechowski was informed that there is no guarantee that she would be a candidate for interventional therapies. The decision will be based on the results of diagnostic studies, as well as Ms. Eastmond's risk profile.  Procedure(s) hx Left axillary peripheral nerve stimulation: 12/30/19 providing significant pain relief.  Removed 02/29/2020              Recent Visits Date Type Provider Dept  11/08/21 Procedure visit Edward Jolly, MD [facility]  10/11/21 Procedure visit Edward Jolly, MD [facility]  10/03/21 Office Visit Edward Jolly, MD [facility]  09/20/21 Procedure visit Edward Jolly, MD [facility]  Showing recent visits within past 90 days and meeting all other requirements Today's Visits Date Type Provider Dept  12/06/21 Procedure visit Edward Jolly, MD [facility]  Showing today's visits and meeting all other requirements Future Appointments Date Type Provider Dept  01/16/22 Appointment Edward Jolly, MD [facility]  Showing future  appointments within next 90 days and meeting all other requirements  Disposition: Discharge home  Discharge (Date  Time): 12/06/2021; 1426 hrs.   Primary Care Physician: Marguarite Arbour, MD Location: [facility] Note by: Edward Jolly, MD Date: 12/06/2021; Time: 2:27 PM  Disclaimer:  Medicine is not an exact science. The only guarantee in medicine is that nothing is guaranteed. It is important to note that the decision to proceed with this intervention was based on the information collected from the patient. The Data and conclusions were drawn from the patient's questionnaire, the interview, and the physical examination. Because the information was provided in large part by the patient, it cannot be guaranteed that it has not been purposely or unconsciously manipulated. Every effort has been made to obtain as much relevant data as possible for this evaluation. It is important to note that the conclusions that lead to this procedure are derived in large part from the available data. Always take into account that the treatment will also be dependent on availability of resources and existing treatment guidelines, considered by other Pain Management Practitioners as being common knowledge and practice, at the time of the intervention. For Medico-Legal purposes, it is also important to point out that variation in procedural techniques and pharmacological choices are the acceptable norm. The indications, contraindications, technique, and results of the above procedure should only be interpreted and judged by a Board-Certified Interventional Pain Specialist with extensive familiarity and expertise in the same exact procedure and technique.

## 2021-12-07 ENCOUNTER — Ambulatory Visit (INDEPENDENT_AMBULATORY_CARE_PROVIDER_SITE_OTHER): Payer: Medicare HMO | Admitting: Neurosurgery

## 2021-12-07 ENCOUNTER — Encounter: Payer: Self-pay | Admitting: Neurosurgery

## 2021-12-07 VITALS — BP 139/96 | HR 93 | Ht <= 58 in | Wt 155.0 lb

## 2021-12-07 DIAGNOSIS — Z981 Arthrodesis status: Secondary | ICD-10-CM

## 2021-12-07 DIAGNOSIS — M7918 Myalgia, other site: Secondary | ICD-10-CM

## 2021-12-07 NOTE — Progress Notes (Signed)
Procedure: C6-7 ACDF Date of procedure: 02/11/2019 Diagnosis: Cervical spinal instability  HISTORY OF PRESENT ILLNESS: Brianna Arnold is status post C6-7 ACDF.   Overall, she is doing fair.   She was sent for evaluation of her MRI scan. PHYSICAL EXAMINATION:   Vitals:   12/07/21 0934  BP: (!) 139/96  Pulse: 93     General: Patient is well-developed, well-nourished, and in no apparent distress  Neurological: Awake, alert, oriented to person, place, and time. Facial tone is symmetric.   Strength: Side Biceps Triceps Deltoid Interossei Grip Wrist Ext. Wrist Flex.  R 5 5 5 5 5 5 5   L 5 5 5 5 5 5 5   Lower extremities: 5/5 throughout Sensory: intact and symmetric to light touch in upper extremities  Skin: Incision site is healing well.   IMAGING:  MRI Brain 09/02/20 IMPRESSION:  1. No evidence of acute intracranial abnormality.  2. Partially imaged occipital and cervical hardware fusion and  extensive abnormalities of segmentation, better characterized on  prior CT of the cervical spine. Nonexpansile T2 hyperintense signal  within the cord at the craniocervical junction likely represents  myelomalacia given findings on remote MRI from 01/08/2014. Mild to  moderate canal stenosis at the craniocervical junction with bone  contacting and flattening the left ventral cord.  3. Poorly visualized left vertebral artery flow void, favored  chronic given small left vertebral artery flow void on prior MRI  cervical spine.   Electronically Signed    By: 09/04/20 MD    On: 09/03/2020 11:44  MRI C spine 11/13/21 IMPRESSION: 1. No compressive canal or foraminal stenosis. 2. Similar C2 myelomalacia. 3. Redemonstrated extensive segmentation anomalies, further characterized on prior CT of the cervical spine.     Electronically Signed   By: 09/05/2020 M.D.   On: 11/13/2021 15:28  Assessment / Plan: Brianna Arnold is doing fair s/p C6-7 ACDF.  She is stable at  this point.  She also has history of extensive occipitocervical and thoracolumbar fusions.  Her occipital cervical and thoracolumbar fusions are well-healed.  She has an area of myelomalacia at the craniocervical junction which is stable over repeat MRI scans.  There is no ongoing compression.  I would not recommend further imaging or intervention in her cervical or thoracolumbar spine.  Her myelomalacia is chronic and stable.  It does not warrant further work-up.  I spent a total of 15 minutes in both face-to-face and non-face-to-face activities for this visit on the date of this encounter.  11/15/2021 MD, [facility] Department of Neurosurgery

## 2022-01-03 ENCOUNTER — Ambulatory Visit (HOSPITAL_BASED_OUTPATIENT_CLINIC_OR_DEPARTMENT_OTHER): Payer: Medicare HMO | Admitting: Student in an Organized Health Care Education/Training Program

## 2022-01-03 ENCOUNTER — Ambulatory Visit
Admission: RE | Admit: 2022-01-03 | Discharge: 2022-01-03 | Disposition: A | Discharge status: Home / Self Care | Payer: Medicare HMO | Source: Ambulatory Visit | Attending: Student in an Organized Health Care Education/Training Program | Admitting: Student in an Organized Health Care Education/Training Program

## 2022-01-03 ENCOUNTER — Encounter: Payer: Self-pay | Admitting: Student in an Organized Health Care Education/Training Program

## 2022-01-03 VITALS — BP 132/95 | HR 103 | Temp 96.8°F | Ht <= 58 in | Wt 152.0 lb

## 2022-01-03 DIAGNOSIS — Z981 Arthrodesis status: Secondary | ICD-10-CM

## 2022-01-03 DIAGNOSIS — M7918 Myalgia, other site: Secondary | ICD-10-CM | POA: Insufficient documentation

## 2022-01-03 DIAGNOSIS — G894 Chronic pain syndrome: Secondary | ICD-10-CM | POA: Insufficient documentation

## 2022-01-03 DIAGNOSIS — M4105 Infantile idiopathic scoliosis, thoracolumbar region: Secondary | ICD-10-CM

## 2022-01-03 MED ORDER — DIAZEPAM 5 MG PO TABS
ORAL_TABLET | ORAL | Status: AC
  Filled 2022-01-03: qty 1

## 2022-01-03 MED ORDER — ROPIVACAINE HCL 2 MG/ML IJ SOLN
9.0000 mL | Freq: Once | INTRAMUSCULAR | Status: AC
  Administered 2022-01-03: 9 mL via PERINEURAL

## 2022-01-03 MED ORDER — DIAZEPAM 5 MG PO TABS
5.0000 mg | ORAL_TABLET | ORAL | Status: AC
  Administered 2022-01-03: 5 mg via ORAL

## 2022-01-03 NOTE — Patient Instructions (Signed)

## 2022-01-03 NOTE — Progress Notes (Signed)
Nursing Pain Medication Assessment:  Safety precautions to be maintained throughout the outpatient stay will include: orient to surroundings, keep bed in low position, maintain call bell within reach at all times, provide assistance with transfer out of bed and ambulation.  Medication Inspection Compliance: Brianna Arnold did not comply with our request to bring her pills to be counted. She was reminded that bringing the medication bottles, even when empty, is a requirement.  Medication: None brought in. Pill/Patch Count: None available to be counted. Bottle Appearance: No container available. Did not bring bottle(s) to appointment. Filled Date: N/A Last Medication intake:   2 weeks ago

## 2022-01-03 NOTE — Progress Notes (Signed)
PROVIDER NOTE: Information contained herein reflects review and annotations entered in association with encounter. Interpretation of such information and data should be left to medically-trained personnel. Information provided to patient can be located elsewhere in the medical record under "Patient Instructions". Document created using STT-dictation technology, any transcriptional errors that may result from process are unintentional.    Patient: Brianna Arnold  Service Category: Procedure  Provider: Edward JollyBilal Haden Cavenaugh, MD  DOB: 10-02-1990  DOS: 01/03/2022  Location: [facility]  MRN: [id number]  Setting: Ambulatory - outpatient  Referring Provider: Marguarite ArbourSparks, Jeffrey D, MD  Type: Established Patient  Specialty: Interventional Pain Management  PCP: Marguarite ArbourSparks, Jeffrey D, MD   Primary Reason for Visit: Interventional Pain Management Treatment. CC: Hip Pain (hip) and Back Pain (Left sacral area)   Procedure:          Anesthesia, Analgesia, Anxiolysis:  Type: Lumbar Paraspinal Muscle Trigger Point Injection (3+)         Quadratus lumborum, erector spinae, multifidus and periscapular region and bilateral cervical/trapezius TPI CPT: 20553 Primary Purpose: Therapeutic Approach: Percutaneous, ipsilateral approach. Laterality: Midline        Type: Local Anesthesia + 5 mg Po Valium  Position: Prone   Indications: 1. Infantile idiopathic scoliosis of thoracolumbar region   2. Myofascial pain syndrome of lumbar spine   3. History of lumbar spinal fusion (T11- iliac)   4. Chronic pain syndrome       Pain Score: Pre-procedure: 6 /10 Post-procedure: 4  (4/10 right hip moving, 3/10 everything)/10   Pre-op H&P Assessment:  Brianna Arnold is a 31 y.o. (year old), female patient, seen today for interventional treatment. She  has a past surgical history that includes Cervical spine surgery (07/2014); Back surgery (10/2014); and Anterior cervical decomp/discectomy fusion (N/A, 02/11/2019). Ms.  Brianna Arnold has a current medication list which includes the following prescription(s): albuterol, baclofen, buprenorphine, vitamin d3, cyclobenzaprine, dicyclomine, ergocalciferol, famotidine, ferrous sulfate, gabapentin, medroxyprogesterone, ofloxacin, promethazine, propranolol er, tramadol, venlafaxine xr, vitamin b-12, and zolpidem. Her primarily concern today is the Hip Pain (hip) and Back Pain (Left sacral area)   Initial Vital Signs:  Pulse/HCG Rate: (!) 103  Temp: (!) 96.8 F (36 C) Resp:   BP: (!) 132/95 SpO2: 100 %  BMI: Estimated body mass index is 35.33 kg/m as calculated from the following:   Height as of this encounter: 4\' 7"  (1.397 m).   Weight as of this encounter: 152 lb (68.9 kg).  Risk Assessment: Allergies: Reviewed. She is allergic to hypafix [wound dressings], ativan [lorazepam], and tizanidine.  Allergy Precautions: None required Coagulopathies: Reviewed. None identified.  Blood-thinner therapy: None at this time Active Infection(s): Reviewed. None identified. Brianna Arnold is afebrile  Site Confirmation: Brianna Arnold was asked to confirm the procedure and laterality before marking the site Procedure checklist: Completed Consent: Before the procedure and under the influence of no sedative(s), amnesic(s), or anxiolytics, the patient was informed of the treatment options, risks and possible complications. To fulfill our ethical and legal obligations, as recommended by the American Medical Association's Code of Ethics, I have informed the patient of my clinical impression; the nature and purpose of the treatment or procedure; the risks, benefits, and possible complications of the intervention; the alternatives, including doing nothing; the risk(s) and benefit(s) of the alternative treatment(s) or procedure(s); and the risk(s) and benefit(s) of doing nothing. The patient was provided information about the general risks and possible complications associated with the procedure. These  may include, but are not limited to: failure  to achieve desired goals, infection, bleeding, organ or nerve damage, allergic reactions, paralysis, and death. In addition, the patient was informed of those risks and complications associated to the procedure, such as failure to decrease pain; infection; bleeding; organ or nerve damage with subsequent damage to sensory, motor, and/or autonomic systems, resulting in permanent pain, numbness, and/or weakness of one or several areas of the body; allergic reactions; (i.e.: anaphylactic reaction); and/or death. Furthermore, the patient was informed of those risks and complications associated with the medications. These include, but are not limited to: allergic reactions (i.e.: anaphylactic or anaphylactoid reaction(s)); adrenal axis suppression; blood sugar elevation that in diabetics may result in ketoacidosis or comma; water retention that in patients with history of congestive heart failure may result in shortness of breath, pulmonary edema, and decompensation with resultant heart failure; weight gain; swelling or edema; medication-induced neural toxicity; particulate matter embolism and blood vessel occlusion with resultant organ, and/or nervous system infarction; and/or aseptic necrosis of one or more joints. Finally, the patient was informed that Medicine is not an exact science; therefore, there is also the possibility of unforeseen or unpredictable risks and/or possible complications that may result in a catastrophic outcome. The patient indicated having understood very clearly. We have given the patient no guarantees and we have made no promises. Enough time was given to the patient to ask questions, all of which were answered to the patient's satisfaction. Brianna Arnold has indicated that she wanted to continue with the procedure. Attestation: I, the ordering provider, attest that I have discussed with the patient the benefits, risks, side-effects, alternatives,  likelihood of achieving goals, and potential problems during recovery for the procedure that I have provided informed consent. Date  Time: 01/03/2022  1:59 PM  Pre-Procedure Preparation:  Monitoring: As per clinic protocol. Respiration, ETCO2, SpO2, BP, heart rate and rhythm monitor placed and checked for adequate function Safety Precautions: Patient was assessed for positional comfort and pressure points before starting the procedure. Time-out: I initiated and conducted the "Time-out" before starting the procedure, as per protocol. The patient was asked to participate by confirming the accuracy of the "Time Out" information. Verification of the correct person, site, and procedure were performed and confirmed by me, the nursing staff, and the patient. "Time-out" conducted as per Joint Commission's Universal Protocol (UP.01.01.01). Time: 1429  Description of Procedure:          Area Prepped: Entire Lower Lumbosacral Region & Right cervical region DuraPrep (Iodine Povacrylex [0.7% available iodine] and Isopropyl Alcohol, 74% w/w) Safety Precautions: Aspiration looking for blood return was conducted prior to all injections. At no point did we inject any substances, as a needle was being advanced. No attempts were made at seeking any paresthesias. Safe injection practices and needle disposal techniques used. Medications properly checked for expiration dates. SDV (single dose vial) medications used. Description of the Procedure: Protocol guidelines were followed. The patient was placed in position over the fluoroscopy table. The target area was identified and the area prepped in the usual manner. Skin & deeper tissues infiltrated with local anesthetic. Appropriate amount of time allowed to pass for local anesthetics to take effect. The procedure needles were then advanced to the target area. Proper needle placement secured. Negative aspiration confirmed. Solution injected in intermittent fashion, asking for  systemic symptoms every 0.5cc of injectate. The needles were then removed and the area cleansed, making sure to leave some of the prepping solution back to take advantage of its long term bactericidal properties.  Vitals:  01/03/22 1403  BP: (!) 132/95  Pulse: (!) 103  Temp: (!) 96.8 F (36 C)  SpO2: 100%  Weight: 152 lb (68.9 kg)  Height: 4\' 7"  (1.397 m)       Start Time: 1429 hrs. End Time: 1438 hrs. Materials:  Needle(s) Type: Regular needle Gauge: 27G Length: 1.5-in  Medication(s): Please see orders for medications and dosing details.   18 trigger points were also injected in the right and left cervical, right and left trapezius, & lumbar paraspinal region with dry needling performed.  Each trigger point was injected with 0.5 to 1 cc of 0.2% ropivacaine.   Post-operative Assessment:  Post-procedure Vital Signs:  Pulse/HCG Rate: (!) 103  Temp: (!) 96.8 F (36 C) Resp:   BP: (!) 132/95 SpO2: 100 %  EBL: None  Complications: No immediate post-treatment complications observed by team, or reported by patient.  Note: The patient tolerated the entire procedure well. A repeat set of vitals were taken after the procedure and the patient was kept under observation following institutional policy, for this type of procedure. Post-procedural neurological assessment was performed, showing return to baseline, prior to discharge. The patient was provided with post-procedure discharge instructions, including a section on how to identify potential problems. Should any problems arise concerning this procedure, the patient was given instructions to immediately contact , at any time, without hesitation. In any case, we plan to contact the patient by telephone for a follow-up status report regarding this interventional procedure.  Comments:  No additional relevant information.  Plan of Care   Medications ordered for procedure: Meds ordered this encounter  Medications   diazepam  (VALIUM) tablet 5 mg    Make sure Flumazenil is available in the pyxis when using this medication. If oversedation occurs, administer 0.2 mg IV over 15 sec. If after 45 sec no response, administer 0.2 mg again over 1 min; may repeat at 1 min intervals; not to exceed 4 doses (1 mg)   ropivacaine (PF) 2 mg/mL (0.2%) (NAROPIN) injection 9 mL   ropivacaine (PF) 2 mg/mL (0.2%) (NAROPIN) injection 9 mL    Patient is endorsing increased lumbar spine pain, right greater than left hip pain and SI joint pain.  We will follow-up with x-ray imaging as below.  Orders Placed This Encounter  Procedures   DG PAIN CLINIC C-ARM 1-60 MIN NO REPORT    Intraoperative interpretation by procedural physician at [facility].    Standing Status:   Standing    Number of Occurrences:   1    Order Specific Question:   Reason for exam:    Answer:   Assistance in needle guidance and placement for procedures requiring needle placement in or near specific anatomical locations not easily accessible without such assistance.   DG HIP UNILAT W OR W/O PELVIS 2-3 VIEWS RIGHT    Please describe any evidence of DJD, such as joint narrowing, asymmetry, cysts, or any anomalies in bone density, production, or erosion.    Standing Status:   Future    Number of Occurrences:   1    Standing Expiration Date:   02/02/2022    Scheduling Instructions:     Imaging must be done as soon as possible. Inform patient that order will expire within 30 days and I will not renew it.    Order Specific Question:   Reason for Exam (SYMPTOM  OR DIAGNOSIS REQUIRED)    Answer:   Right hip pain/arthralgia    Order Specific Question:  Is the patient pregnant?    Answer:   No    Order Specific Question:   Preferred imaging location?    Answer:   [facility]    Order Specific Question:   Call Results- Best Contact Number?    Answer:   [phone] [facility])    Order Specific Question:   Release to patient    Answer:    Immediate   DG HIP UNILAT W OR W/O PELVIS 2-3 VIEWS LEFT    Please describe any evidence of DJD, such as joint narrowing, asymmetry, cysts, or any anomalies in bone density, production, or erosion.    Standing Status:   Future    Number of Occurrences:   1    Standing Expiration Date:   02/02/2022    Scheduling Instructions:     Imaging must be done as soon as possible. Inform patient that order will expire within 30 days and I will not renew it.    Order Specific Question:   Reason for Exam (SYMPTOM  OR DIAGNOSIS REQUIRED)    Answer:   Right hip pain/arthralgia    Order Specific Question:   Is the patient pregnant?    Answer:   No    Order Specific Question:   Preferred imaging location?    Answer:   [facility]    Order Specific Question:   Call Results- Best Contact Number?    Answer:   [phone] [facility])    Order Specific Question:   Release to patient    Answer:   Immediate   DG Lumbar Spine Complete W/Bend    Patient presents with axial pain with possible radicular component. Please assist [place] in identifying specific level(s) and laterality of any additional findings such as: 1. Facet (Zygapophyseal) joint DJD (Hypertrophy, space narrowing, subchondral sclerosis, and/or osteophyte formation) 2. DDD and/or IVDD (Loss of disc height, desiccation, gas patterns, osteophytes, endplate sclerosis, or "Black disc disease") 3. Pars defects 4. Spondylolisthesis, spondylosis, and/or spondyloarthropathies (include Degree/Grade of displacement in mm) (stability) 5. Vertebral body Fractures (acute/chronic) (state percentage of collapse) 6. Demineralization (osteopenia/osteoporotic) 7. Bone pathology 8. Foraminal narrowing  9. Surgical changes    Standing Status:   Future    Number of Occurrences:   1    Standing Expiration Date:   02/02/2022    Scheduling Instructions:     Imaging must be done as soon as possible. Inform patient that order will expire within 30 days and I  will not renew it.    Order Specific Question:   Reason for Exam (SYMPTOM  OR DIAGNOSIS REQUIRED)    Answer:   Low back pain    Order Specific Question:   Is patient pregnant?    Answer:   No    Order Specific Question:   Preferred imaging location?    Answer:   [facility]    Order Specific Question:   Call Results- Best Contact Number?    Answer:   [phone] [facility])    Order Specific Question:   Radiology Contrast Protocol - do NOT remove file path    Answer:   [email or web address]    Order Specific Question:   Release to patient    Answer:   Immediate   DG Si Joints    Standing Status:   Future    Number of Occurrences:   1    Standing Expiration Date:   02/02/2022    Scheduling Instructions:     Imaging must  be done as soon as possible. Inform patient that order will expire within 30 days and I will not renew it.    Order Specific Question:   Reason for Exam (SYMPTOM  OR DIAGNOSIS REQUIRED)    Answer:   Right hip pain/arthralgia    Order Specific Question:   Is the patient pregnant?    Answer:   No    Order Specific Question:   Preferred imaging location?    Answer:   [facility]    Order Specific Question:   Call Results- Best Contact Number?    Answer:   [phone] [facility])    Order Specific Question:   Release to patient    Answer:   Immediate     Medications administered: We administered diazepam, ropivacaine (PF) 2 mg/mL (0.2%), and ropivacaine (PF) 2 mg/mL (0.2%).  See the medical record for exact dosing, route, and time of administration.  Follow-up plan:   Return for Keep sch. appt.       Pharmacological management options:  Opioid Analgesics: Butrans patch, side effects at 7.5 mcg an hour, analgesic benefit with no side effects at 5 mcg an hour, continue  Membrane stabilizer: Continue gabapentin as prescribed  Muscle relaxant: Continue Flexeril as prescribed  NSAID: To be determined  at a later time  Other analgesic(s): To be determined at a later time    Interventional management options: Brianna Arnold was informed that there is no guarantee that she would be a candidate for interventional therapies. The decision will be based on the results of diagnostic studies, as well as Brianna Arnold's risk profile.  Procedure(s) hx Left axillary peripheral nerve stimulation: 12/30/19 providing significant pain relief.  Removed 02/29/2020              Recent Visits Date Type Provider Dept  12/06/21 Procedure visit Edward Jolly, MD [facility]  11/08/21 Procedure visit Edward Jolly, MD [facility]  10/11/21 Procedure visit Edward Jolly, MD [facility]  Showing recent visits within past 90 days and meeting all other requirements Today's Visits Date Type Provider Dept  01/03/22 Procedure visit Edward Jolly, MD [facility]  Showing today's visits and meeting all other requirements Future Appointments Date Type Provider Dept  01/16/22 Appointment Edward Jolly, MD [facility]  01/24/22 Appointment Edward Jolly, MD [facility]  Showing future appointments within next 90 days and meeting all other requirements  Disposition: Discharge home  Discharge (Date  Time): 01/03/2022; 1445 hrs.   Primary Care Physician: Marguarite Arbour, MD Location: [facility] Note by: Edward Jolly, MD Date: 01/03/2022; Time: 3:14 PM  Disclaimer:  Medicine is not an exact science. The only guarantee in medicine is that nothing is guaranteed. It is important to note that the decision to proceed with this intervention was based on the information collected from the patient. The Data and conclusions were drawn from the patient's questionnaire, the interview, and the physical examination. Because the information was provided in large part by the patient, it cannot be guaranteed that it has not been purposely or  unconsciously manipulated. Every effort has been made to obtain as much relevant data as possible for this evaluation. It is important to note that the conclusions that lead to this procedure are derived in large part from the available data. Always take into account that the treatment will also be dependent on availability of resources and existing treatment guidelines, considered by other Pain Management Practitioners as  being common knowledge and practice, at the time of the intervention. For Medico-Legal purposes, it is also important to point out that variation in procedural techniques and pharmacological choices are the acceptable norm. The indications, contraindications, technique, and results of the above procedure should only be interpreted and judged by a Board-Certified Interventional Pain Specialist with extensive familiarity and expertise in the same exact procedure and technique.

## 2022-01-04 ENCOUNTER — Telehealth: Payer: Self-pay | Admitting: *Deleted

## 2022-01-04 NOTE — Telephone Encounter (Signed)
Post procedure call;  voicemail left.  

## 2022-01-16 ENCOUNTER — Ambulatory Visit
Payer: Medicare HMO | Attending: Student in an Organized Health Care Education/Training Program | Admitting: Student in an Organized Health Care Education/Training Program

## 2022-01-16 ENCOUNTER — Encounter: Payer: Self-pay | Admitting: Student in an Organized Health Care Education/Training Program

## 2022-01-16 VITALS — BP 137/89 | HR 109 | Temp 97.6°F | Ht <= 58 in | Wt 148.0 lb

## 2022-01-16 DIAGNOSIS — M4105 Infantile idiopathic scoliosis, thoracolumbar region: Secondary | ICD-10-CM | POA: Insufficient documentation

## 2022-01-16 DIAGNOSIS — G894 Chronic pain syndrome: Secondary | ICD-10-CM | POA: Diagnosis present

## 2022-01-16 DIAGNOSIS — G8929 Other chronic pain: Secondary | ICD-10-CM | POA: Insufficient documentation

## 2022-01-16 DIAGNOSIS — M25552 Pain in left hip: Secondary | ICD-10-CM | POA: Insufficient documentation

## 2022-01-16 DIAGNOSIS — M7918 Myalgia, other site: Secondary | ICD-10-CM | POA: Insufficient documentation

## 2022-01-16 DIAGNOSIS — Z981 Arthrodesis status: Secondary | ICD-10-CM | POA: Insufficient documentation

## 2022-01-16 DIAGNOSIS — M67912 Unspecified disorder of synovium and tendon, left shoulder: Secondary | ICD-10-CM | POA: Insufficient documentation

## 2022-01-16 MED ORDER — BUPRENORPHINE 5 MCG/HR TD PTWK: 1.0000 | MEDICATED_PATCH | TRANSDERMAL | 3 refills | Status: DC

## 2022-01-16 NOTE — Progress Notes (Signed)
PROVIDER NOTE: Information contained herein reflects review and annotations entered in association with encounter. Interpretation of such information and data should be left to medically-trained personnel. Information provided to patient can be located elsewhere in the medical record under "Patient Instructions". Document created using STT-dictation technology, any transcriptional errors that may result from process are unintentional.    Patient: Brianna Arnold  Service Category: E/M  Provider: Gillis Santa, MD  DOB: [birth year]  DOS: 01/16/2022  Specialty: Interventional Pain Management  MRN: [id number]  Setting: Ambulatory outpatient  PCP: Idelle Crouch, MD  Type: Established Patient    Referring Provider: Idelle Crouch, MD  Location: Office  Delivery: Face-to-face     HPI  [name], a 31 y.o. year old female, is here today because of her Chronic left hip pain [M25.552, G89.29]. [name]'s primary complain today is Back Pain and Hip Pain (bilateral) Last encounter: My last encounter with her was on 10/03/21 Pertinent problems: [name] has Status post cervical spinal fusion; Migraine without aura and without status migrainosus, not intractable; History of lumbar spinal fusion (T11- iliac); Cervicalgia; Chronic left shoulder pain; Disorder of left rotator cuff; and Chronic pain syndrome on their pertinent problem list. Pain Assessment: Severity of Chronic pain is reported as a 4 /10. Location: Back Lower/radiates into legs to knee. Onset: More than a month ago. Quality: Aching, Stabbing. Timing: Intermittent. Modifying factor(s): meds, procedures. Vitals:  height is '4\' 7"'  (1.397 m) and weight is 148 lb (67.1 kg). Her temperature is 97.6 F (36.4 C). Her blood pressure is 137/89 and her pulse is 109 (abnormal). Her oxygen saturation is 99%.   Reason for encounter: medication management.  No change in medical history since last visit.  Patient's pain is at  baseline.  Patient continues multimodal pain regimen as prescribed: Butrans patch 5 mcg an hour..  States that it provides pain relief and improvement in functional status. Finds benefit with every 3 to every 4 week cervical, thoracic, lumbar TPI's. Is having increased left hip pain, SI joint pain with occasional radiation into her left leg.  A component of this is certainly spinal related as a result of her lumbar spinal fusion from T11 to iliac spine.  She could benefit from further evaluation from my sports medicine colleague Dr. Zigmund Daniel and perhaps consider an ultrasound-guided injection.  Pharmacotherapy Assessment  Analgesic: Butrans 5 mcg/hr q 7days   Monitoring: [name] PMP: PDMP not reviewed this encounter.       Pharmacotherapy: No side-effects or adverse reactions reported. Compliance: No problems identified. Effectiveness: Clinically acceptable.  Dewayne Shorter, RN  01/16/2022  8:17 AM  Sign when Signing Visit Nursing Pain Medication Assessment:  Safety precautions to be maintained throughout the outpatient stay will include: orient to surroundings, keep bed in low position, maintain call bell within reach at all times, provide assistance with transfer out of bed and ambulation.  Medication Inspection Compliance: Pill count conducted under aseptic conditions, in front of the patient. Neither the pills nor the bottle was removed from the patient's sight at any time. Once count was completed pills were immediately returned to the patient in their original bottle.  Medication: Tramadol (Ultram) Pill/Patch Count:  81 of 90 pills remain Pill/Patch Appearance: Markings consistent with prescribed medication Bottle Appearance: Standard pharmacy container. Clearly labeled. Filled Date: 07 / 13 / 2023 Last Medication intake:  Today Butrans 3/4 Per patient. She brought the wrong box    UDS:  Summary  Date Value Ref  Range Status  10/15/2019 Note  Final    Comment:     ==================================================================== Compliance Drug Analysis, Ur ==================================================================== Test                             Result       Flag       Units  Drug Absent but Declared for Prescription Verification   Buprenorphine                  Not Detected UNEXPECTED ng/mg creat    Transdermal buprenorphine, as indicated in the declared medication    list, is not always detected even when used as directed.    Tramadol                       Not Detected UNEXPECTED ng/mg creat   Gabapentin                     Not Detected UNEXPECTED   Cyclobenzaprine                Not Detected UNEXPECTED   Trazodone                      Not Detected UNEXPECTED   Promethazine                   Not Detected UNEXPECTED ==================================================================== Test                      Result    Flag   Units      Ref Range   Creatinine              91               mg/dL      >=20 ==================================================================== Declared Medications:  The flagging and interpretation on this report are based on the  following declared medications.  Unexpected results may arise from  inaccuracies in the declared medications.   **Note: The testing scope of this panel includes these medications:   Cyclobenzaprine (Flexeril)  Gabapentin (Neurontin)  Promethazine (Phenergan)  Tramadol (Ultram)  Trazodone (Desyrel)   **Note: The testing scope of this panel does not include small to  moderate amounts of these reported medications:   Buprenorphine Patch (BuTrans)   **Note: The testing scope of this panel does not include the  following reported medications:   Albuterol (Ventolin HFA)  Ethinyl Estradiol (Sprintec)  Famotidine (Pepcid)  Iron  Norgestimate (Sprintec)  Vitamin B12  Vitamin D3 ==================================================================== For clinical  consultation, please call [phone]. ====================================================================      ROS  Constitutional: Denies any fever or chills Gastrointestinal: No reported hemesis, hematochezia, vomiting, or acute GI distress Musculoskeletal:  Chronic cervical, thoracic, lumbar spine pain, left hip pain Neurological: No reported episodes of acute onset apraxia, aphasia, dysarthria, agnosia, amnesia, paralysis, loss of coordination, or loss of consciousness  Medication Review  Vitamin D3, albuterol, baclofen, buprenorphine, cyclobenzaprine, dicyclomine, ergocalciferol, famotidine, ferrous sulfate, gabapentin, medroxyPROGESTERone, ofloxacin, promethazine, propranolol ER, traMADol, venlafaxine XR, vitamin B-12, and zolpidem  History Review  Allergy: Ms. Salas is allergic to hypafix [wound dressings], ativan [lorazepam], and tizanidine. Drug: Ms. Huard  reports no history of drug use. Alcohol:  has no history on file for alcohol use. Tobacco:  reports that she has never smoked. She has never used smokeless tobacco. Social: Ms. Oswald  reports  that she has never smoked. She has never used smokeless tobacco. She reports that she does not use drugs. Medical:  has a past medical history of Anemia and Jarcho-Levin syndrome. Surgical: Ms. Abdo  has a past surgical history that includes Cervical spine surgery (07/2014); Back surgery (10/2014); and Anterior cervical decomp/discectomy fusion (N/A, 02/11/2019). Family: family history includes Hypertension in her father.  Laboratory Chemistry Profile   Renal Lab Results  Component Value Date   BUN 6 02/15/2019   CREATININE 0.63 02/15/2019   GFRAA >60 02/15/2019   GFRNONAA >60 02/15/2019    Hepatic Lab Results  Component Value Date   AST 17 02/14/2019   ALT 12 02/14/2019   ALBUMIN 3.9 02/14/2019   ALKPHOS 55 02/14/2019   LIPASE 18 02/14/2019    Electrolytes Lab Results  Component Value Date   NA 137 02/15/2019    K 4.1 02/15/2019   CL 106 02/15/2019   CALCIUM 8.1 (L) 02/15/2019    Bone No results found for: "VD25OH", "VD125OH2TOT", "OE4235TI1", "WE3154MG8", "25OHVITD1", "25OHVITD2", "25OHVITD3", "TESTOFREE", "TESTOSTERONE"  Inflammation (CRP: Acute Phase) (ESR: Chronic Phase) Lab Results  Component Value Date   LATICACIDVEN 1.6 04/19/2015         Note: Above Lab results reviewed.  Recent Imaging Review  DG Lumbar Spine Complete W/Bend CLINICAL DATA:  Lower back pain.  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 This makes numbering vertebral bodies difficult. 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  The lowest open disc space is considered L5-S1. There are screws seen bilaterally at the vertebral body considered S1 and the bilateral ilia.  There is posterior rod and screw fusion again seen from the lower thoracic spine through the sacrum and ilium. No definite evidence of hardware failure. Mild-to-moderate levocurvature of the mid to lower lumbar spine is similar to prior.  IMPRESSION: 1. 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 2. Posterior fusion of the lower thoracic spine through the sacrum and bilateral ilia. No evidence of hardware failure.  Electronically Signed   By: Yvonne Kendall M.D.   On: 01/03/2022 17:34 DG Si Joints CLINICAL DATA:  Right hip pain/arthralgia.  EXAM: BILATERAL SACROILIAC JOINTS - 3+ VIEW; DG HIP (WITH OR WITHOUT PELVIS) 2-3V RIGHT; DG HIP (WITH OR WITHOUT PELVIS) 2-3V LEFT  COMPARISON:  Frontal chest and  abdominal/pelvic radiographs 02/14/2019  FINDINGS: There is thoracic through the sacrum bilateral foramina screw fusion hardware again seen. There is also solid fused peripheral lumbar spine bone graft again seen. No evidence of hardware failure.  Mild joint space narrowing of the bilateral inferior sacroiliac joints, similar to prior. The bilateral femoroacetabular and pubic symphysis joint spaces appear maintained. No acute fracture or dislocation.  IMPRESSION: Mild bilateral inferior sacroiliac joint space narrowing. Otherwise, no significant sacroiliac osteoarthritis.  No significant osteoarthritis of either femoroacetabular joint.  Electronically Signed   By: Yvonne Kendall M.D.   On: 01/03/2022 17:29 DG HIP UNILAT W OR W/O PELVIS 2-3 VIEWS LEFT CLINICAL DATA:  Right hip pain/arthralgia.  EXAM: BILATERAL SACROILIAC JOINTS - 3+ VIEW; DG HIP (WITH OR WITHOUT PELVIS) 2-3V RIGHT; DG HIP (WITH OR WITHOUT PELVIS) 2-3V LEFT  COMPARISON:  Frontal chest  and abdominal/pelvic radiographs 02/14/2019  FINDINGS: There is thoracic through the sacrum bilateral foramina screw fusion hardware again seen. There is also solid fused peripheral lumbar spine bone graft again seen. No evidence of hardware failure.  Mild joint space narrowing of the bilateral inferior sacroiliac joints, similar to prior. The bilateral femoroacetabular and pubic symphysis joint spaces appear maintained. No acute fracture or dislocation.  IMPRESSION: Mild bilateral inferior sacroiliac joint space narrowing. Otherwise, no significant sacroiliac osteoarthritis.  No significant osteoarthritis of either femoroacetabular joint.  Electronically Signed   By: Yvonne Kendall M.D.   On: 01/03/2022 17:29 DG HIP UNILAT W OR W/O PELVIS 2-3 VIEWS RIGHT CLINICAL DATA:  Right hip pain/arthralgia.  EXAM: BILATERAL SACROILIAC JOINTS - 3+ VIEW; DG HIP (WITH OR WITHOUT PELVIS) 2-3V RIGHT; DG HIP (WITH OR WITHOUT PELVIS)  2-3V LEFT  COMPARISON:  Frontal chest and abdominal/pelvic radiographs 02/14/2019  FINDINGS: There is thoracic through the sacrum bilateral foramina screw fusion hardware again seen. There is also solid fused peripheral lumbar spine bone graft again seen. No evidence of hardware failure.  Mild joint space narrowing of the bilateral inferior sacroiliac joints, similar to prior. The bilateral femoroacetabular and pubic symphysis joint spaces appear maintained. No acute fracture or dislocation.  IMPRESSION: Mild bilateral inferior sacroiliac joint space narrowing. Otherwise, no significant sacroiliac osteoarthritis.  No significant osteoarthritis of either femoroacetabular joint.  Electronically Signed   By: Yvonne Kendall M.D.   On: 01/03/2022 17:29 DG PAIN CLINIC C-ARM 1-60 MIN NO REPORT Fluoro was used, but no Radiologist interpretation will be provided.  Please refer to "NOTES" tab for provider progress note. Note: Reviewed        Physical Exam  General appearance: Well nourished, well developed, and well hydrated. In no apparent acute distress Mental status: Alert, oriented x 3 (person, place, & time)       Respiratory: No evidence of acute respiratory distress Eyes: [name] Vitals: BP 137/89   Pulse (!) 109   Temp 97.6 F (36.4 C)   Ht '4\' 7"'  (1.397 m)   Wt 148 lb (67.1 kg)   SpO2 99%   BMI 34.40 kg/m  BMI: Estimated body mass index is 34.4 kg/m as calculated from the following:   Height as of this encounter: '4\' 7"'  (1.397 m).   Weight as of this encounter: 148 lb (67.1 kg). Ideal: Patient must be at least 60 in tall to calculate ideal body weight  Cervical Spine Exam  Skin & Axial Inspection: Well healed scar from previous spine surgery detected Alignment: Asymmetric Functional ROM: Pain restricted ROM      Stability: No instability detected Muscle Tone/Strength: Functionally intact. No obvious neuro-muscular anomalies detected. Sensory (Neurological):  Neuropathic pain pattern Palpation: No palpable anomalies                 Thoracic Spine Area Exam  Skin & Axial Inspection: Well healed scar from previous spine surgery detected Alignment: Symmetrical Functional ROM: Mechanically restricted ROM Stability: No instability detected Muscle Tone/Strength: Functionally intact. No obvious neuro-muscular anomalies detected. Sensory (Neurological): Neurogenic pain pattern Muscle strength & Tone: No palpable anomalies   Lumbar Exam  Skin & Axial Inspection: Well healed scar from previous spine surgery detected Alignment: Scoliosis detected, significant Functional ROM: Pain restricted ROM       Stability: No instability detected Muscle Tone/Strength: Functionally intact. No obvious neuro-muscular anomalies detected. Sensory (Neurological):  Musculoskeletal pain pattern Palpation: Complains of area being tender to palpation  Gait & Posture Assessment  Ambulation: Limited Gait: Antalgic Posture: Difficulty standing up straight, due to pain    Lower Extremity Exam      Side: Right lower extremity   Side: Left lower extremity  Stability: No instability observed           Stability: No instability observed          Skin & Extremity Inspection: Skin color, temperature, and hair growth are WNL. No peripheral edema or cyanosis. No masses, redness, swelling, asymmetry, or associated skin lesions. No contractures.   Skin & Extremity Inspection: Skin color, temperature, and hair growth are WNL. No peripheral edema or cyanosis. No masses, redness, swelling, asymmetry, or associated skin lesions. No contractures.  Functional ROM: Pain restricted ROM of the left hip, pain overlying SI joint and piriformis                   Functional ROM: Pain restricted ROM                  Muscle Tone/Strength: Functionally intact. No obvious neuro-muscular anomalies detected.   Muscle Tone/Strength: Functionally intact. No obvious neuro-muscular anomalies  detected.  Sensory (Neurological): Musculoskeletal       Sensory (Neurological): Unimpaired        DTR: Patellar: deferred today Achilles: deferred today Plantar: deferred today   DTR: Patellar: deferred today Achilles: deferred today Plantar: deferred today  Palpation: No palpable anomalies   Palpation: No palpable anomalies      Assessment   Diagnosis Status  1. Chronic left hip pain   2. Infantile idiopathic scoliosis of thoracolumbar region   3. Myofascial pain syndrome of lumbar spine   4. History of lumbar spinal fusion (T11- iliac)   5. Status post cervical spinal fusion   6. Disorder of left rotator cuff   7. Chronic pain syndrome    Controlled Controlled Controlled     Plan of Care    Ms. Keyon Winnick Lovecchio has a current medication list which includes the following long-term medication(s): albuterol, dicyclomine, famotidine, ferrous sulfate, gabapentin, medroxyprogesterone, promethazine, propranolol er, and zolpidem.  Pharmacotherapy (Medications Ordered): Meds ordered this encounter  Medications   buprenorphine (BUTRANS) 5 MCG/HR PTWK    Sig: Place 1 patch onto the skin once a week.    Dispense:  4 patch    Refill:  3    Chronic Pain: STOP Act (Not applicable) Fill 1 day early if closed on refill date. Avoid benzodiazepines within 8 hours of opioids   Orders Placed This Encounter  Procedures   AMB referral to sports medicine    Referral Priority:   Routine    Referral Type:   Consultation    Referred to Provider:   Montel Culver, MD    Number of Visits Requested:   1   Referral to sports medicine colleague, Dr. Zigmund Daniel for evaluation of left hip, SI joint pain and consideration of ultrasound-guided intervention Continue with every 3 to 4-week trigger point injections for myofascial pain.  Follow-up plan:   Return in about 4 months (around 05/17/2022) for Medication Management, in person.     Pharmacological management options:  Opioid  Analgesics: Butrans patch, side effects at 7.5 mcg an hour, analgesic benefit with no side effects at 5 mcg an hour, continue  Membrane stabilizer: Continue gabapentin as prescribed  Muscle relaxant: Continue Flexeril as prescribed  NSAID: To be determined at a later time  Other analgesic(s): To be determined at a later time  Interventional management options: Ms. Vallo was informed that there is no guarantee that she would be a candidate for interventional therapies. The decision will be based on the results of diagnostic studies, as well as Ms. Primm's risk profile.  Procedure(s) hx Left axillary peripheral nerve stimulation: 12/30/19 providing significant pain relief.  Removed 02/29/2020               Recent Visits Date Type Provider Dept  01/03/22 Procedure visit Gillis Santa, MD [facility]  12/06/21 Procedure visit Gillis Santa, MD [facility]  11/08/21 Procedure visit Gillis Santa, MD [facility]  Showing recent visits within past 90 days and meeting all other requirements Today's Visits Date Type Provider Dept  01/16/22 Office Visit Gillis Santa, MD [facility]  Showing today's visits and meeting all other requirements Future Appointments Date Type Provider Dept  01/24/22 Appointment Gillis Santa, MD [facility]  Showing future appointments within next 90 days and meeting all other requirements  I discussed the assessment and treatment plan with the patient. The patient was provided an opportunity to ask questions and all were answered. The patient agreed with the plan and demonstrated an understanding of the instructions.  Patient advised to call back or seek an in-person evaluation if the symptoms or condition worsens.  Duration of encounter: 70mnutes.  Note by: BGillis Santa MD Date: 01/16/2022; Time: 9:11 AM

## 2022-01-16 NOTE — Progress Notes (Signed)
Nursing Pain Medication Assessment:  Safety precautions to be maintained throughout the outpatient stay will include: orient to surroundings, keep bed in low position, maintain call bell within reach at all times, provide assistance with transfer out of bed and ambulation.  Medication Inspection Compliance: Pill count conducted under aseptic conditions, in front of the patient. Neither the pills nor the bottle was removed from the patient's sight at any time. Once count was completed pills were immediately returned to the patient in their original bottle.  Medication: Tramadol (Ultram) Pill/Patch Count:  81 of 90 pills remain Pill/Patch Appearance: Markings consistent with prescribed medication Bottle Appearance: Standard pharmacy container. Clearly labeled. Filled Date: 07 / 13 / 2023 Last Medication intake:  Today Butrans 3/4 Per patient. She brought the wrong box

## 2022-01-24 ENCOUNTER — Ambulatory Visit
Payer: Medicare HMO | Attending: Student in an Organized Health Care Education/Training Program | Admitting: Student in an Organized Health Care Education/Training Program

## 2022-01-24 ENCOUNTER — Encounter: Payer: Self-pay | Admitting: Student in an Organized Health Care Education/Training Program

## 2022-01-24 VITALS — BP 127/100 | HR 86 | Temp 97.3°F | Resp 17 | Ht <= 58 in | Wt 151.0 lb

## 2022-01-24 DIAGNOSIS — M542 Cervicalgia: Secondary | ICD-10-CM | POA: Diagnosis present

## 2022-01-24 DIAGNOSIS — G894 Chronic pain syndrome: Secondary | ICD-10-CM | POA: Insufficient documentation

## 2022-01-24 DIAGNOSIS — M7918 Myalgia, other site: Secondary | ICD-10-CM | POA: Insufficient documentation

## 2022-01-24 MED ORDER — ROPIVACAINE HCL 2 MG/ML IJ SOLN
INTRAMUSCULAR | Status: AC
  Filled 2022-01-24: qty 20

## 2022-01-24 MED ORDER — ROPIVACAINE HCL 2 MG/ML IJ SOLN
9.0000 mL | Freq: Once | INTRAMUSCULAR | Status: AC
  Administered 2022-01-24: 9 mL via PERINEURAL

## 2022-01-24 MED ORDER — MIDAZOLAM HCL 5 MG/5ML IJ SOLN: 0.5000 mg | Freq: Once | INTRAMUSCULAR | Status: DC

## 2022-01-24 MED ORDER — DIAZEPAM 5 MG PO TABS
ORAL_TABLET | ORAL | Status: AC
  Filled 2022-01-24: qty 1

## 2022-01-24 NOTE — Progress Notes (Signed)
PROVIDER NOTE: Information contained herein reflects review and annotations entered in association with encounter. Interpretation of such information and data should be left to medically-trained personnel. Information provided to patient can be located elsewhere in the medical record under "Patient Instructions". Document created using STT-dictation technology, any transcriptional errors that may result from process are unintentional.    Patient: Brianna Arnold  Service Category: Procedure  Provider: Gillis Santa, MD  DOB: Dec 29, 1990  DOS: 01/24/2022  Location: [facility]  MRN: [id number]  Setting: Ambulatory - outpatient  Referring Provider: Idelle Crouch, MD  Type: Established Patient  Specialty: Interventional Pain Management  PCP: Idelle Crouch, MD   Primary Reason for Visit: Interventional Pain Management Treatment. CC: Back Pain (lower) and Shoulder Pain   Procedure:          Anesthesia, Analgesia, Anxiolysis:  Type: Lumbar Paraspinal Muscle Trigger Point Injection (3+)         Quadratus lumborum, erector spinae, multifidus and periscapular region and bilateral cervical/trapezius TPI CPT: 20553 Primary Purpose: Therapeutic Approach: Percutaneous, ipsilateral approach. Laterality: Midline        Type: Local Anesthesia + 5 mg Po Valium  Position: Prone   Indications: 1. Myofascial pain syndrome of lumbar spine   2. Cervicalgia   3. Chronic pain syndrome       Pain Score: Pre-procedure: 5 /10 Post-procedure: 5 /10   Pre-op H&P Assessment:  [name] is a 31 y.o. (year old), female patient, seen today for interventional treatment. She  has a past surgical history that includes Cervical spine surgery (07/2014); Back surgery (10/2014); and Anterior cervical decomp/discectomy fusion (N/A, 02/11/2019). [name] has a current medication list which includes the following prescription(s): albuterol, baclofen, [START ON 02/03/2022] buprenorphine,  vitamin d3, cyclobenzaprine, dicyclomine, ergocalciferol, famotidine, ferrous sulfate, gabapentin, medroxyprogesterone, ofloxacin, promethazine, propranolol er, tramadol, venlafaxine xr, vitamin b-12, and zolpidem, and the following Facility-Administered Medications: midazolam, ropivacaine (pf) 2 mg/ml (0.2%), and ropivacaine (pf) 2 mg/ml (0.2%). Her primarily concern today is the Back Pain (lower) and Shoulder Pain   Initial Vital Signs:  Pulse/HCG Rate: 86ECG Heart Rate: 93 Temp: (!) 97.3 F (36.3 C) Resp: 17 BP: 118/83 SpO2: 100 %  BMI: Estimated body mass index is 35.1 kg/m as calculated from the following:   Height as of this encounter: 4\' 7"  (1.397 m).   Weight as of this encounter: 151 lb (68.5 kg).  Risk Assessment: Allergies: Reviewed. She is allergic to hypafix [wound dressings], ativan [lorazepam], and tizanidine.  Allergy Precautions: None required Coagulopathies: Reviewed. None identified.  Blood-thinner therapy: None at this time Active Infection(s): Reviewed. None identified. [name] is afebrile  Site Confirmation: [name] was asked to confirm the procedure and laterality before marking the site Procedure checklist: Completed Consent: Before the procedure and under the influence of no sedative(s), amnesic(s), or anxiolytics, the patient was informed of the treatment options, risks and possible complications. To fulfill our ethical and legal obligations, as recommended by the American Medical Association's Code of Ethics, I have informed the patient of my clinical impression; the nature and purpose of the treatment or procedure; the risks, benefits, and possible complications of the intervention; the alternatives, including doing nothing; the risk(s) and benefit(s) of the alternative treatment(s) or procedure(s); and the risk(s) and benefit(s) of doing nothing. The patient was provided information about the general risks and possible complications associated with the  procedure. These may include, but are not limited to: failure to achieve desired goals, infection, bleeding, organ or  nerve damage, allergic reactions, paralysis, and death. In addition, the patient was informed of those risks and complications associated to the procedure, such as failure to decrease pain; infection; bleeding; organ or nerve damage with subsequent damage to sensory, motor, and/or autonomic systems, resulting in permanent pain, numbness, and/or weakness of one or several areas of the body; allergic reactions; (i.e.: anaphylactic reaction); and/or death. Furthermore, the patient was informed of those risks and complications associated with the medications. These include, but are not limited to: allergic reactions (i.e.: anaphylactic or anaphylactoid reaction(s)); adrenal axis suppression; blood sugar elevation that in diabetics may result in ketoacidosis or comma; water retention that in patients with history of congestive heart failure may result in shortness of breath, pulmonary edema, and decompensation with resultant heart failure; weight gain; swelling or edema; medication-induced neural toxicity; particulate matter embolism and blood vessel occlusion with resultant organ, and/or nervous system infarction; and/or aseptic necrosis of one or more joints. Finally, the patient was informed that Medicine is not an exact science; therefore, there is also the possibility of unforeseen or unpredictable risks and/or possible complications that may result in a catastrophic outcome. The patient indicated having understood very clearly. We have given the patient no guarantees and we have made no promises. Enough time was given to the patient to ask questions, all of which were answered to the patient's satisfaction. [name] has indicated that she wanted to continue with the procedure. Attestation: I, the ordering provider, attest that I have discussed with the patient the benefits, risks,  side-effects, alternatives, likelihood of achieving goals, and potential problems during recovery for the procedure that I have provided informed consent. Date  Time: 01/24/2022  1:57 PM  Pre-Procedure Preparation:  Monitoring: As per clinic protocol. Respiration, ETCO2, SpO2, BP, heart rate and rhythm monitor placed and checked for adequate function Safety Precautions: Patient was assessed for positional comfort and pressure points before starting the procedure. Time-out: I initiated and conducted the "Time-out" before starting the procedure, as per protocol. The patient was asked to participate by confirming the accuracy of the "Time Out" information. Verification of the correct person, site, and procedure were performed and confirmed by me, the nursing staff, and the patient. "Time-out" conducted as per Joint Commission's Universal Protocol (UP.01.01.01). Time: 1429  Description of Procedure:          Area Prepped: Entire Lower Lumbosacral Region & Right cervical region DuraPrep (Iodine Povacrylex [0.7% available iodine] and Isopropyl Alcohol, 74% w/w) Safety Precautions: Aspiration looking for blood return was conducted prior to all injections. At no point did we inject any substances, as a needle was being advanced. No attempts were made at seeking any paresthesias. Safe injection practices and needle disposal techniques used. Medications properly checked for expiration dates. SDV (single dose vial) medications used. Description of the Procedure: Protocol guidelines were followed. The patient was placed in position over the fluoroscopy table. The target area was identified and the area prepped in the usual manner. Skin & deeper tissues infiltrated with local anesthetic. Appropriate amount of time allowed to pass for local anesthetics to take effect. The procedure needles were then advanced to the target area. Proper needle placement secured. Negative aspiration confirmed. Solution injected in  intermittent fashion, asking for systemic symptoms every 0.5cc of injectate. The needles were then removed and the area cleansed, making sure to leave some of the prepping solution back to take advantage of its long term bactericidal properties.  Vitals:   01/24/22 1416 01/24/22 1424 01/24/22 1429  01/24/22 1435  BP: (!) 133/93 (!) 131/104 (!) 127/99 (!) 127/100  Pulse:      Resp: 18 18 16 17   Temp:      TempSrc:      SpO2: 99% 98% 99% 98%  Weight:      Height:           Start Time: 1429 hrs. End Time: 1435 hrs. Materials:  Needle(s) Type: Regular needle Gauge: 27G Length: 1.5-in  Medication(s): Please see orders for medications and dosing details.   13 trigger points were also injected in the right and left cervical, right and left trapezius, & lumbar paraspinal region with dry needling performed.  Each trigger point was injected with 0.5 to 1 cc of 0.2% ropivacaine.   Post-operative Assessment:  Post-procedure Vital Signs:  Pulse/HCG Rate: 8690 Temp: (!) 97.3 F (36.3 C) Resp: 17 BP: (!) 127/100 SpO2: 98 %  EBL: None  Complications: No immediate post-treatment complications observed by team, or reported by patient.  Note: The patient tolerated the entire procedure well. A repeat set of vitals were taken after the procedure and the patient was kept under observation following institutional policy, for this type of procedure. Post-procedural neurological assessment was performed, showing return to baseline, prior to discharge. The patient was provided with post-procedure discharge instructions, including a section on how to identify potential problems. Should any problems arise concerning this procedure, the patient was given instructions to immediately contact , at any time, without hesitation. In any case, we plan to contact the patient by telephone for a follow-up status report regarding this interventional procedure.  Comments:  No additional relevant  information.  Plan of Care   Medications ordered for procedure: Meds ordered this encounter  Medications   midazolam (VERSED) 5 MG/5ML injection 0.5-2 mg    Make sure Flumazenil is available in the pyxis when using this medication. If oversedation occurs, administer 0.2 mg IV over 15 sec. If after 45 sec no response, administer 0.2 mg again over 1 min; may repeat at 1 min intervals; not to exceed 4 doses (1 mg)   ropivacaine (PF) 2 mg/mL (0.2%) (NAROPIN) injection 9 mL   ropivacaine (PF) 2 mg/mL (0.2%) (NAROPIN) injection 9 mL    No orders of the defined types were placed in this encounter.    Medications administered: [place]. Strohman "Jasmine Pang" had no medications administered during this visit.  See the medical record for exact dosing, route, and time of administration.  Follow-up plan:   No follow-ups on file.       Pharmacological management options:  Opioid Analgesics: Butrans patch, side effects at 7.5 mcg an hour, analgesic benefit with no side effects at 5 mcg an hour, continue  Membrane stabilizer: Continue gabapentin as prescribed  Muscle relaxant: Continue Flexeril as prescribed  NSAID: To be determined at a later time  Other analgesic(s): To be determined at a later time    Interventional management options: Ms. Hodges was informed that there is no guarantee that she would be a candidate for interventional therapies. The decision will be based on the results of diagnostic studies, as well as Ms. Schuff's risk profile.  Procedure(s) hx Left axillary peripheral nerve stimulation: 12/30/19 providing significant pain relief.  Removed 02/29/2020              Recent Visits Date Type Provider Dept  01/16/22 Office Visit 01/18/22, MD [facility]  01/03/22 Procedure visit 01/05/22, MD [facility]  12/06/21 Procedure visit Shajuan Musso,  Roselie Skinner, MD [facility]  11/08/21 Procedure visit Edward Jolly, MD [facility]  Showing  recent visits within past 90 days and meeting all other requirements Today's Visits Date Type Provider Dept  01/24/22 Procedure visit Edward Jolly, MD [facility]  Showing today's visits and meeting all other requirements Future Appointments Date Type Provider Dept  02/21/22 Appointment Edward Jolly, MD [facility]  Showing future appointments within next 90 days and meeting all other requirements  Disposition: Discharge home  Discharge (Date  Time): 01/24/2022; 1445 hrs.   Primary Care Physician: Marguarite Arbour, MD Location: [facility] Note by: Edward Jolly, MD Date: 01/24/2022; Time: 2:53 PM  Disclaimer:  Medicine is not an exact science. The only guarantee in medicine is that nothing is guaranteed. It is important to note that the decision to proceed with this intervention was based on the information collected from the patient. The Data and conclusions were drawn from the patient's questionnaire, the interview, and the physical examination. Because the information was provided in large part by the patient, it cannot be guaranteed that it has not been purposely or unconsciously manipulated. Every effort has been made to obtain as much relevant data as possible for this evaluation. It is important to note that the conclusions that lead to this procedure are derived in large part from the available data. Always take into account that the treatment will also be dependent on availability of resources and existing treatment guidelines, considered by other Pain Management Practitioners as being common knowledge and practice, at the time of the intervention. For Medico-Legal purposes, it is also important to point out that variation in procedural techniques and pharmacological choices are the acceptable norm. The indications, contraindications, technique, and results of the above procedure should only be interpreted and judged by a Board-Certified  Interventional Pain Specialist with extensive familiarity and expertise in the same exact procedure and technique.

## 2022-01-24 NOTE — Progress Notes (Signed)
Safety precautions to be maintained throughout the outpatient stay will include: orient to surroundings, keep bed in low position, maintain call bell within reach at all times, provide assistance with transfer out of bed and ambulation.  

## 2022-01-24 NOTE — Patient Instructions (Signed)

## 2022-01-24 NOTE — Progress Notes (Signed)
1405 Valium 5mg  PO per MD verbal order.

## 2022-02-09 ENCOUNTER — Encounter: Payer: Self-pay | Admitting: Family Medicine

## 2022-02-09 ENCOUNTER — Ambulatory Visit (INDEPENDENT_AMBULATORY_CARE_PROVIDER_SITE_OTHER): Payer: Medicare HMO | Admitting: Family Medicine

## 2022-02-09 VITALS — BP 126/86 | HR 88 | Ht <= 58 in | Wt 150.0 lb

## 2022-02-09 DIAGNOSIS — G8929 Other chronic pain: Secondary | ICD-10-CM | POA: Diagnosis not present

## 2022-02-09 DIAGNOSIS — M25552 Pain in left hip: Secondary | ICD-10-CM | POA: Diagnosis not present

## 2022-02-09 DIAGNOSIS — M1611 Unilateral primary osteoarthritis, right hip: Secondary | ICD-10-CM | POA: Diagnosis not present

## 2022-02-09 DIAGNOSIS — M533 Sacrococcygeal disorders, not elsewhere classified: Secondary | ICD-10-CM

## 2022-02-09 MED ORDER — METHOCARBAMOL 500 MG PO TABS: 500.0000 mg | ORAL_TABLET | Freq: Three times a day (TID) | ORAL | 0 refills | Status: DC | PRN

## 2022-02-09 MED ORDER — MELOXICAM 15 MG PO TABS: 15.0000 mg | ORAL_TABLET | Freq: Every day | ORAL | 0 refills | Status: DC

## 2022-02-09 NOTE — Assessment & Plan Note (Signed)
Chronic right greater than left sacroiliac joint pain in the setting of fusion surgery.  This area is symptomatic on examination today, see additional assessment(s) for plan details.  Plan for addressing pain at the surrounding structures and start physical therapy to offload SI joint despite motion restrictions due to surgical history.

## 2022-02-09 NOTE — Patient Instructions (Addendum)
-   Referral coordinator will contact you for physical therapy scheduling - Can dose methocarbamol 500 mg up to three times a day as-needed, side effect can be drowsiness - Dose meloxicam once daily with food (no other NSAIDs while on this medication) - Return as schedule for reevaluation

## 2022-02-09 NOTE — Assessment & Plan Note (Signed)
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  Examination does show positive FADIR, negative FABER, negative straight leg raise, equivocal piriformis testing and tenderness about the greater trochanter.  She will be started on scheduled meloxicam, as needed methocarbamol, referral to PT, and close follow-up with low threshold to perform intra-articular and greater trochanteric corticosteroid injections under ultrasound guidance.

## 2022-02-09 NOTE — Progress Notes (Signed)
Primary Care / Sports Medicine Office Visit  Patient Information:  Patient ID: Brianna Arnold, female DOB: 30-Jan-1991 Age: 31 y.o. MRN: [id number]   Brianna Arnold is a pleasant 31 y.o. female presenting with the following:  Chief Complaint  Patient presents with   Hip Pain    Was referred by [facility], to discuss hip injections. Right     Vitals:   02/09/22 0806  BP: 126/86  Pulse: 88  SpO2: 98%   Vitals:   02/09/22 0806  Weight: 150 lb (68 kg)  Height: 4\' 7"  (1.397 m)   Body mass index is 34.86 kg/m.  No results found.   Independent interpretation of notes and tests performed by another provider:   Independent interpretation of bilateral hip x-rays demonstrates right femoroacetabular joint with inferior narrowing and sclerosis, cam deformation appreciated, acetabular osteophytes noted bilaterally, left femoroacetabular joint preserved, visualized inferior portion of the sacroiliac joint demonstrates fusion hardware without discrete abnormalities, no acute osseous processes visualized.  Procedures performed:   None  Pertinent History, Exam, Impression, and Recommendations:   Problem List Items Addressed This Visit       Musculoskeletal and Integument   Osteoarthritis of right hip - Primary    Patient presents in consultation from Dr. Gillis Santa, pain and spine regarding bilateral, right greater than left hip pain.  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  Her primary reported pain is right hip posterior to groin with some radiation into the thigh, aggravated with ADLs, sitting, standing, sleeping at night.  Examination with positive FADIR localizing to the groin, FABER recreates maximal pain localizing to the SI joint.  Equivocal straight leg raise, equivocal piriformis stretching, diffuse tenderness about the greater trochanteric region.  We discussed her current symptomatology,  treatment strategies, and she will initiate scheduled meloxicam, as needed methocarbamol, and maintain close follow-up with low threshold to advance to intra-articular right hip joint and possibly greater trochanteric cortisone injections.  Though the SI joint is symptomatic, we will need clearance from her surgeon prior to introduction of triamcinolone into the sacroiliac joint.      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  Examination does show positive FADIR, negative FABER, negative straight leg raise, equivocal piriformis testing and tenderness about the greater trochanter.  She will be started on scheduled meloxicam, as needed methocarbamol, referral to PT, and close follow-up with low threshold to perform intra-articular and greater trochanteric corticosteroid injections under ultrasound guidance.      Chronic sacroiliac pain    Chronic right greater than left sacroiliac joint pain in the setting of fusion surgery.  This area is symptomatic on examination today, see additional assessment(s) for plan details.  Plan for addressing pain at the surrounding structures and start physical therapy to offload SI joint despite motion restrictions due to surgical history.      Relevant Medications   methocarbamol (ROBAXIN) 500 MG tablet   meloxicam (MOBIC) 15 MG tablet     Orders & Medications Meds ordered this encounter  Medications   methocarbamol (ROBAXIN) 500 MG tablet    Sig: Take 1 tablet (500 mg total) by mouth every 8 (eight) hours as needed for muscle spasms.    Dispense:  90  tablet    Refill:  0   meloxicam (MOBIC) 15 MG tablet    Sig: Take 1 tablet (15 mg total) by mouth daily.    Dispense:  30 tablet    Refill:  0   No orders of the defined types were placed in this encounter.    No follow-ups on  file.     Jerrol Banana, MD   Primary Care Sports Medicine [facility]

## 2022-02-09 NOTE — Assessment & Plan Note (Addendum)
Patient presents in consultation from Dr. Gillis Santa, pain and spine regarding bilateral, right greater than left hip pain.  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  Her primary reported pain is right hip posterior to groin with some radiation into the thigh, aggravated with ADLs, sitting, standing, sleeping at night.  Examination with positive FADIR localizing to the groin, FABER recreates maximal pain localizing to the SI joint.  Equivocal straight leg raise, equivocal piriformis stretching, diffuse tenderness about the greater trochanteric region.  We discussed her current symptomatology, treatment strategies, and she will initiate scheduled meloxicam, as needed methocarbamol, and maintain close follow-up with low threshold to advance to intra-articular right hip joint and possibly greater trochanteric cortisone injections.  Though the SI joint is symptomatic, we will need clearance from her surgeon prior to introduction of triamcinolone into the sacroiliac joint.

## 2022-02-21 ENCOUNTER — Encounter: Payer: Self-pay | Admitting: Student in an Organized Health Care Education/Training Program

## 2022-02-21 ENCOUNTER — Ambulatory Visit
Payer: Medicare HMO | Attending: Student in an Organized Health Care Education/Training Program | Admitting: Student in an Organized Health Care Education/Training Program

## 2022-02-21 VITALS — BP 141/94 | HR 7 | Temp 97.5°F | Resp 16 | Ht <= 58 in | Wt 150.0 lb

## 2022-02-21 DIAGNOSIS — M7918 Myalgia, other site: Secondary | ICD-10-CM | POA: Diagnosis present

## 2022-02-21 MED ORDER — ROPIVACAINE HCL 2 MG/ML IJ SOLN
9.0000 mL | Freq: Once | INTRAMUSCULAR | Status: AC
  Administered 2022-02-21: 9 mL via PERINEURAL

## 2022-02-21 MED ORDER — DIAZEPAM 5 MG PO TABS
ORAL_TABLET | ORAL | Status: AC
  Filled 2022-02-21: qty 1

## 2022-02-21 MED ORDER — DIAZEPAM 5 MG PO TABS
5.0000 mg | ORAL_TABLET | ORAL | Status: AC
  Administered 2022-02-21: 5 mg via ORAL

## 2022-02-21 MED ORDER — ROPIVACAINE HCL 2 MG/ML IJ SOLN
INTRAMUSCULAR | Status: AC
  Filled 2022-02-21: qty 20

## 2022-02-21 NOTE — Progress Notes (Signed)
Safety precautions to be maintained throughout the outpatient stay will include: orient to surroundings, keep bed in low position, maintain call bell within reach at all times, provide assistance with transfer out of bed and ambulation.  

## 2022-02-21 NOTE — Patient Instructions (Signed)

## 2022-02-21 NOTE — Progress Notes (Signed)
PROVIDER NOTE: Information contained herein reflects review and annotations entered in association with encounter. Interpretation of such information and data should be left to medically-trained personnel. Information provided to patient can be located elsewhere in the medical record under "Patient Instructions". Document created using STT-dictation technology, any transcriptional errors that may result from process are unintentional.    Patient: Brianna Arnold  Service Category: Procedure  Provider: Gillis Santa, MD  DOB: Aug 25, 1990  DOS: 02/21/2022  Location: [facility]  MRN: [id number]  Setting: Ambulatory - outpatient  Referring Provider: Idelle Crouch, MD  Type: Established Patient  Specialty: Interventional Pain Management  PCP: Idelle Crouch, MD   Primary Reason for Visit: Interventional Pain Management Treatment. CC: Back Pain (Bilateral top to bottom ) and Neck Pain (Bilateral )   Procedure:          Anesthesia, Analgesia, Anxiolysis:  Type: Lumbar Paraspinal Muscle Trigger Point Injection (3+)         Quadratus lumborum, erector spinae, multifidus and periscapular region and bilateral cervical/trapezius TPI CPT: 20553 Primary Purpose: Therapeutic Approach: Percutaneous, ipsilateral approach. Laterality: Midline        Type: Local Anesthesia + 5 mg Po Valium  Position: Prone   Indications: 1. Myofascial pain syndrome of lumbar spine       Pain Score: Pre-procedure: 7 /10 Post-procedure: 4 /10   Pre-op H&P Assessment:  Brianna Arnold is a 31 y.o. (year old), female patient, seen today for interventional treatment. She  has a past surgical history that includes Cervical spine surgery (07/2014); Back surgery (10/2014); and Anterior cervical decomp/discectomy fusion (N/A, 02/11/2019). Brianna Arnold has a current medication list which includes the following prescription(s): albuterol, baclofen, buprenorphine, vitamin d3, cyclobenzaprine, dicyclomine,  ergocalciferol, famotidine, ferrous sulfate, gabapentin, medroxyprogesterone, meloxicam, methocarbamol, ofloxacin, propranolol er, tramadol, venlafaxine xr, vitamin b-12, promethazine, and zolpidem. Her primarily concern today is the Back Pain (Bilateral top to bottom ) and Neck Pain (Bilateral )   Initial Vital Signs:  Pulse/HCG Rate: 73  Temp: (!) 97.5 F (36.4 C) Resp: 16 BP: 117/68 SpO2: 100 %  BMI: Estimated body mass index is 34.86 kg/m as calculated from the following:   Height as of this encounter: 4\' 7"  (1.397 m).   Weight as of this encounter: 150 lb (68 kg).  Risk Assessment: Allergies: Reviewed. She is allergic to hypafix [wound dressings], ativan [lorazepam], and tizanidine.  Allergy Precautions: None required Coagulopathies: Reviewed. None identified.  Blood-thinner therapy: None at this time Active Infection(s): Reviewed. None identified. Brianna Arnold is afebrile  Site Confirmation: Brianna Arnold was asked to confirm the procedure and laterality before marking the site Procedure checklist: Completed Consent: Before the procedure and under the influence of no sedative(s), amnesic(s), or anxiolytics, the patient was informed of the treatment options, risks and possible complications. To fulfill our ethical and legal obligations, as recommended by the American Medical Association's Code of Ethics, I have informed the patient of my clinical impression; the nature and purpose of the treatment or procedure; the risks, benefits, and possible complications of the intervention; the alternatives, including doing nothing; the risk(s) and benefit(s) of the alternative treatment(s) or procedure(s); and the risk(s) and benefit(s) of doing nothing. The patient was provided information about the general risks and possible complications associated with the procedure. These may include, but are not limited to: failure to achieve desired goals, infection, bleeding, organ or nerve damage, allergic  reactions, paralysis, and death. In addition, the patient was informed of those risks and complications  associated to the procedure, such as failure to decrease pain; infection; bleeding; organ or nerve damage with subsequent damage to sensory, motor, and/or autonomic systems, resulting in permanent pain, numbness, and/or weakness of one or several areas of the body; allergic reactions; (i.e.: anaphylactic reaction); and/or death. Furthermore, the patient was informed of those risks and complications associated with the medications. These include, but are not limited to: allergic reactions (i.e.: anaphylactic or anaphylactoid reaction(s)); adrenal axis suppression; blood sugar elevation that in diabetics may result in ketoacidosis or comma; water retention that in patients with history of congestive heart failure may result in shortness of breath, pulmonary edema, and decompensation with resultant heart failure; weight gain; swelling or edema; medication-induced neural toxicity; particulate matter embolism and blood vessel occlusion with resultant organ, and/or nervous system infarction; and/or aseptic necrosis of one or more joints. Finally, the patient was informed that Medicine is not an exact science; therefore, there is also the possibility of unforeseen or unpredictable risks and/or possible complications that may result in a catastrophic outcome. The patient indicated having understood very clearly. We have given the patient no guarantees and we have made no promises. Enough time was given to the patient to ask questions, all of which were answered to the patient's satisfaction. Brianna Arnold has indicated that she wanted to continue with the procedure. Attestation: I, the ordering provider, attest that I have discussed with the patient the benefits, risks, side-effects, alternatives, likelihood of achieving goals, and potential problems during recovery for the procedure that I have provided informed  consent. Date  Time: 02/21/2022  2:15 PM  Pre-Procedure Preparation:  Monitoring: As per clinic protocol. Respiration, ETCO2, SpO2, BP, heart rate and rhythm monitor placed and checked for adequate function Safety Precautions: Patient was assessed for positional comfort and pressure points before starting the procedure. Time-out: I initiated and conducted the "Time-out" before starting the procedure, as per protocol. The patient was asked to participate by confirming the accuracy of the "Time Out" information. Verification of the correct person, site, and procedure were performed and confirmed by me, the nursing staff, and the patient. "Time-out" conducted as per Joint Commission's Universal Protocol (UP.01.01.01). Time: 1447  Description of Procedure:          Area Prepped: Entire Lower Lumbosacral Region & Right cervical region DuraPrep (Iodine Povacrylex [0.7% available iodine] and Isopropyl Alcohol, 74% w/w) Safety Precautions: Aspiration looking for blood return was conducted prior to all injections. At no point did we inject any substances, as a needle was being advanced. No attempts were made at seeking any paresthesias. Safe injection practices and needle disposal techniques used. Medications properly checked for expiration dates. SDV (single dose vial) medications used. Description of the Procedure: Protocol guidelines were followed. The patient was placed in position over the fluoroscopy table. The target area was identified and the area prepped in the usual manner. Skin & deeper tissues infiltrated with local anesthetic. Appropriate amount of time allowed to pass for local anesthetics to take effect. The procedure needles were then advanced to the target area. Proper needle placement secured. Negative aspiration confirmed. Solution injected in intermittent fashion, asking for systemic symptoms every 0.5cc of injectate. The needles were then removed and the area cleansed, making sure to leave  some of the prepping solution back to take advantage of its long term bactericidal properties.  Vitals:   02/21/22 1420 02/21/22 1453  BP: 117/68 (!) 141/94  Pulse: 73 (!) 7  Resp: 16 16  Temp: (!) 97.5 F (36.4  C)   TempSrc: Temporal   SpO2: 100% 100%  Weight: 150 lb (68 kg)   Height: 4\' 7"  (1.397 m)        Start Time: 1447 hrs. End Time: 1451 hrs. Materials:  Needle(s) Type: Regular needle Gauge: 27G Length: 1.5-in  Medication(s): Please see orders for medications and dosing details.   15 trigger points were also injected in the right and left cervical, right and left trapezius, & lumbar paraspinal region with dry needling performed.  Each trigger point was injected with 0.5 to 1 cc of 0.2% ropivacaine.   Post-operative Assessment:  Post-procedure Vital Signs:  Pulse/HCG Rate: (!) 7  Temp: (!) 97.5 F (36.4 C) Resp: 16 BP: (!) 141/94 SpO2: 100 %  EBL: None  Complications: No immediate post-treatment complications observed by team, or reported by patient.  Note: The patient tolerated the entire procedure well. A repeat set of vitals were taken after the procedure and the patient was kept under observation following institutional policy, for this type of procedure. Post-procedural neurological assessment was performed, showing return to baseline, prior to discharge. The patient was provided with post-procedure discharge instructions, including a section on how to identify potential problems. Should any problems arise concerning this procedure, the patient was given instructions to immediately contact us, at any time, without hesitation. In any case, we plan to contact the patient by telephone for a follow-up status report regarding this interventional procedure.  Comments:  No additional relevant information.  Plan of Care   Medications ordered for procedure: Meds ordered this encounter  Medications   diazepam (VALIUM) tablet 5 mg    Make sure Flumazenil is  available in the pyxis when using this medication. If oversedation occurs, administer 0.2 mg IV over 15 sec. If after 45 sec no response, administer 0.2 mg again over 1 min; may repeat at 1 min intervals; not to exceed 4 doses (1 mg)   ropivacaine (PF) 2 mg/mL (0.2%) (NAROPIN) injection 9 mL    No orders of the defined types were placed in this encounter.    Medications administered: We administered diazepam and ropivacaine (PF) 2 mg/mL (0.2%).  See the medical record for exact dosing, route, and time of administration.  Follow-up plan:   Return if symptoms worsen or fail to improve.       Pharmacological management options:  Opioid Analgesics: Butrans patch, side effects at 7.5 mcg an hour, analgesic benefit with no side effects at 5 mcg an hour, continue  Membrane stabilizer: Continue gabapentin as prescribed  Muscle relaxant: Continue Flexeril as prescribed  NSAID: To be determined at a later time  Other analgesic(s): To be determined at a later time    Interventional management options: Brianna Arnold was informed that there is no guarantee that she would be a candidate for interventional therapies. The decision will be based on the results of diagnostic studies, as well as Brianna Arnold's risk profile.  Procedure(s) hx Left axillary peripheral nerve stimulation: 12/30/19 providing significant pain relief.  Removed 02/29/2020              Recent Visits Date Type Provider Dept  01/24/22 Procedure visit Gillis Santa, MD [facility]  01/16/22 Office Visit Gillis Santa, MD [facility]  01/03/22 Procedure visit Gillis Santa, MD [facility]  12/06/21 Procedure visit Gillis Santa, MD [facility]  Showing recent visits within past 90 days and meeting all other requirements Today's Visits Date Type Provider Dept  02/21/22 Procedure visit Antonia Jicha,  Carlus Pavlov, MD [facility]  Showing today's visits and meeting all other requirements Future  Appointments Date Type Provider Dept  05/08/22 Appointment Gillis Santa, MD [facility]  Showing future appointments within next 90 days and meeting all other requirements  Disposition: Discharge home  Discharge (Date  Time): 02/21/2022; 1455 hrs.   Primary Care Physician: Idelle Crouch, MD Location: [facility] Note by: Gillis Santa, MD Date: 02/21/2022; Time: 2:56 PM  Disclaimer:  Medicine is not an exact science. The only guarantee in medicine is that nothing is guaranteed. It is important to note that the decision to proceed with this intervention was based on the information collected from the patient. The Data and conclusions were drawn from the patient's questionnaire, the interview, and the physical examination. Because the information was provided in large part by the patient, it cannot be guaranteed that it has not been purposely or unconsciously manipulated. Every effort has been made to obtain as much relevant data as possible for this evaluation. It is important to note that the conclusions that lead to this procedure are derived in large part from the available data. Always take into account that the treatment will also be dependent on availability of resources and existing treatment guidelines, considered by other Pain Management Practitioners as being common knowledge and practice, at the time of the intervention. For Medico-Legal purposes, it is also important to point out that variation in procedural techniques and pharmacological choices are the acceptable norm. The indications, contraindications, technique, and results of the above procedure should only be interpreted and judged by a Board-Certified Interventional Pain Specialist with extensive familiarity and expertise in the same exact procedure and technique.

## 2022-02-22 ENCOUNTER — Telehealth: Payer: Self-pay

## 2022-02-22 NOTE — Telephone Encounter (Signed)
Post procedure phone call.  LVM.

## 2022-03-02 ENCOUNTER — Encounter: Payer: Self-pay | Admitting: Family Medicine

## 2022-03-02 ENCOUNTER — Inpatient Hospital Stay (INDEPENDENT_AMBULATORY_CARE_PROVIDER_SITE_OTHER): Payer: Medicare HMO | Admitting: Radiology

## 2022-03-02 ENCOUNTER — Ambulatory Visit (INDEPENDENT_AMBULATORY_CARE_PROVIDER_SITE_OTHER): Payer: Medicare HMO | Admitting: Family Medicine

## 2022-03-02 VITALS — BP 130/102 | HR 91 | Ht <= 58 in | Wt 151.0 lb

## 2022-03-02 DIAGNOSIS — M1611 Unilateral primary osteoarthritis, right hip: Secondary | ICD-10-CM

## 2022-03-02 DIAGNOSIS — G8929 Other chronic pain: Secondary | ICD-10-CM

## 2022-03-02 DIAGNOSIS — M533 Sacrococcygeal disorders, not elsewhere classified: Secondary | ICD-10-CM

## 2022-03-02 DIAGNOSIS — M25551 Pain in right hip: Secondary | ICD-10-CM | POA: Diagnosis not present

## 2022-03-02 DIAGNOSIS — M25552 Pain in left hip: Secondary | ICD-10-CM | POA: Diagnosis not present

## 2022-03-02 MED ORDER — TRIAMCINOLONE ACETONIDE 40 MG/ML IJ SUSP
160.0000 mg | Freq: Once | INTRAMUSCULAR | Status: AC
  Administered 2022-03-02: 160 mg via INTRAMUSCULAR

## 2022-03-02 NOTE — Assessment & Plan Note (Signed)
See additional assessment(s) for plan details. 

## 2022-03-02 NOTE — Progress Notes (Signed)
Primary Care / Sports Medicine Office Visit  Patient Information:  Patient ID: Brianna Arnold, female DOB: [birth year] Age: 31 y.o. MRN: [id number]   Brianna Arnold is a pleasant 30 y.o. female presenting with the following:  Chief Complaint  Patient presents with   Osteoarthritis of right hip    Vitals:   03/02/22 0933 03/02/22 0934  BP: (!) 130/100 (!) 130/102  Pulse: 91   SpO2: 98%    Vitals:   03/02/22 0933  Weight: 151 lb (68.5 kg)  Height: 4\' 7"  (1.397 m)   Body mass index is 35.1 kg/m.  No results found.   Independent interpretation of notes and tests performed by another provider:   None  Procedures performed:   Procedure:  Injection of right hip joint under ultrasound guidance. Ultrasound guidance utilized for in-plane approach to the right hip joint, no effusion noted Samsung HS60 device utilized with permanent recording / reporting. Verbal informed consent obtained and verified. Skin prepped in a sterile fashion. Ethyl chloride for topical local analgesia.  Completed without difficulty and tolerated well. Medication: triamcinolone acetonide 40 mg/mL suspension for injection 1 mL total and 2 mL lidocaine 1% without epinephrine utilized for needle placement anesthetic Advised to contact for fevers/chills, erythema, induration, drainage, or persistent bleeding.  Procedure:  Injection of right greater trochanter under ultrasound guidance. Ultrasound guidance utilized for out of plane approach to the right greater trochanter, no clearly visualized sonographic evidence of tendinopathy Samsung HS60 device utilized with permanent recording / reporting. Verbal informed consent obtained and verified. Skin prepped in a sterile fashion. Ethyl chloride for topical local analgesia.  Completed without difficulty and tolerated well. Medication: triamcinolone acetonide 40 mg/mL suspension for injection 1 mL total and 2 mL lidocaine 1% without  epinephrine utilized for needle placement anesthetic Advised to contact for fevers/chills, erythema, induration, drainage, or persistent bleeding.  Procedure:  Injection of left hip joint under ultrasound guidance. Ultrasound guidance utilized for in-plane approach to left hip joint, no effusion visualized Samsung HS60 device utilized with permanent recording / reporting. Verbal informed consent obtained and verified. Skin prepped in a sterile fashion. Ethyl chloride for topical local analgesia.  Completed without difficulty and tolerated well. Medication: triamcinolone acetonide 40 mg/mL suspension for injection 1 mL total and 2 mL lidocaine 1% without epinephrine utilized for needle placement anesthetic Advised to contact for fevers/chills, erythema, induration, drainage, or persistent bleeding.  Procedure:  Injection of left greater trochanter under ultrasound guidance. Ultrasound guidance utilized for out of plane approach to left greater trochanteric region, no effusion Samsung HS60 device utilized with permanent recording / reporting. Verbal informed consent obtained and verified. Skin prepped in a sterile fashion. Ethyl chloride for topical local analgesia.  Completed without difficulty and tolerated well. Medication: triamcinolone acetonide 40 mg/mL suspension for injection 1 mL total and 2 mL lidocaine 1% without epinephrine utilized for needle placement anesthetic Advised to contact for fevers/chills, erythema, induration, drainage, or persistent bleeding.  Pertinent History, Exam, Impression, and Recommendations:   Problem List Items Addressed This Visit       Musculoskeletal and Integument   Osteoarthritis of right hip - Primary    Chronic condition with ongoing symptomatology despite intervention discussed at last visit.  Examination does show positive FADIR bilaterally with localization to the groin, additional tenderness throughout the greater trochanteric regions  bilaterally.  Given persistent symptomatology, treatments today, patient did elect to proceed with ultrasound-guided intra-articular corticosteroid injections to bilateral hips, additional ultrasound guided injections at the  right and left greater trochanteric regions.  Appropriate post care was reviewed, she is to start formal physical therapy and return for an office follow-up after PT completes.      Relevant Orders   [place] LIMITED JOINT SPACE STRUCTURES LOW BILAT   Greater trochanteric pain syndrome of right lower extremity    See additional assessment(s) for plan details.      Greater trochanteric pain syndrome of left lower extremity    See additional assessment(s) for plan details.        Other   Arthralgia of left hip    See additional assessment(s) for plan details.      Relevant Orders   [place] LIMITED JOINT SPACE STRUCTURES LOW BILAT   Chronic sacroiliac pain    Chronic condition with ongoing symptomatology. Will evaluate response and change in SI joint pain bilaterally following injections and physical therapy. See additional assessment(s) for plan details.      Relevant Orders   [place] LIMITED JOINT SPACE STRUCTURES LOW BILAT     Orders & Medications Meds ordered this encounter  Medications   triamcinolone acetonide (KENALOG-40) injection 160 mg   Orders Placed This Encounter  Procedures   [place] LIMITED JOINT SPACE STRUCTURES LOW BILAT     Return if symptoms worsen or fail to improve.     Jerrol Banana, MD   Primary Care Sports Medicine [facility]

## 2022-03-02 NOTE — Assessment & Plan Note (Signed)
Chronic condition with ongoing symptomatology despite intervention discussed at last visit.  Examination does show positive FADIR bilaterally with localization to the groin, additional tenderness throughout the greater trochanteric regions bilaterally.  Given persistent symptomatology, treatments today, patient did elect to proceed with ultrasound-guided intra-articular corticosteroid injections to bilateral hips, additional ultrasound guided injections at the right and left greater trochanteric regions.  Appropriate post care was reviewed, she is to start formal physical therapy and return for an office follow-up after PT completes.

## 2022-03-02 NOTE — Assessment & Plan Note (Signed)
Chronic condition with ongoing symptomatology. Will evaluate response and change in SI joint pain bilaterally following injections and physical therapy. See additional assessment(s) for plan details.

## 2022-03-02 NOTE — Patient Instructions (Signed)
You have just been given a cortisone injection to reduce pain and inflammation. After the injection you may notice immediate relief of pain as a result of the Lidocaine. It is important to rest the area of the injection for 24 to 48 hours after the injection. There is a possibility of some temporary increased discomfort and swelling for up to 72 hours until the cortisone begins to work. If you do have pain, simply rest the joint and use ice. If you can tolerate over the counter medications, you can try Tylenol, Aleve, or Advil for added relief per package instructions. - As above, relative rest x2 days and gradual return to normal activity - Start physical therapy once scheduled and perform home exercises - After PT completes, contact our office to schedule follow-up visit

## 2022-03-05 ENCOUNTER — Other Ambulatory Visit: Payer: Self-pay

## 2022-03-05 ENCOUNTER — Encounter: Payer: Self-pay | Admitting: Family Medicine

## 2022-03-05 DIAGNOSIS — M25552 Pain in left hip: Secondary | ICD-10-CM

## 2022-03-05 DIAGNOSIS — M1611 Unilateral primary osteoarthritis, right hip: Secondary | ICD-10-CM

## 2022-03-14 ENCOUNTER — Encounter: Payer: Self-pay | Admitting: Student in an Organized Health Care Education/Training Program

## 2022-03-14 ENCOUNTER — Ambulatory Visit
Payer: Medicare HMO | Attending: Student in an Organized Health Care Education/Training Program | Admitting: Student in an Organized Health Care Education/Training Program

## 2022-03-14 VITALS — BP 131/90 | HR 94 | Temp 97.1°F | Resp 18 | Ht <= 58 in | Wt 150.0 lb

## 2022-03-14 DIAGNOSIS — M542 Cervicalgia: Secondary | ICD-10-CM | POA: Insufficient documentation

## 2022-03-14 DIAGNOSIS — M7918 Myalgia, other site: Secondary | ICD-10-CM | POA: Diagnosis present

## 2022-03-14 DIAGNOSIS — G894 Chronic pain syndrome: Secondary | ICD-10-CM | POA: Diagnosis present

## 2022-03-14 DIAGNOSIS — M4105 Infantile idiopathic scoliosis, thoracolumbar region: Secondary | ICD-10-CM | POA: Insufficient documentation

## 2022-03-14 MED ORDER — ROPIVACAINE HCL 2 MG/ML IJ SOLN
INTRAMUSCULAR | Status: AC
  Filled 2022-03-14: qty 20

## 2022-03-14 MED ORDER — ROPIVACAINE HCL 2 MG/ML IJ SOLN
9.0000 mL | Freq: Once | INTRAMUSCULAR | Status: AC
  Administered 2022-03-14: 9 mL via PERINEURAL

## 2022-03-14 NOTE — Progress Notes (Signed)
PROVIDER NOTE: Information contained herein reflects review and annotations entered in association with encounter. Interpretation of such information and data should be left to medically-trained personnel. Information provided to patient can be located elsewhere in the medical record under "Patient Instructions". Document created using STT-dictation technology, any transcriptional errors that may result from process are unintentional.    Patient: Brianna Arnold  Service Category: Procedure  Provider: Edward Jolly, MD  DOB: [birth year]  DOS: 03/14/2022  Location: [facility]  MRN: [id number]  Setting: Ambulatory - outpatient  Referring Provider: Marguarite Arbour, MD  Type: Established Patient  Specialty: Interventional Pain Management  PCP: Marguarite Arbour, MD   Primary Reason for Visit: Interventional Pain Management Treatment. CC: Pain (Neck to hips)   Procedure:          Anesthesia, Analgesia, Anxiolysis:  Type: Lumbar Paraspinal Muscle Trigger Point Injection (3+)         Quadratus lumborum, erector spinae, multifidus and periscapular region and bilateral cervical/trapezius TPI CPT: 20553 Primary Purpose: Therapeutic Approach: Percutaneous, ipsilateral approach. Laterality: Midline        Type: Local Anesthesia   Position: Prone   Indications: 1. Cervicalgia   2. Myofascial pain syndrome of lumbar spine   3. Infantile idiopathic scoliosis of thoracolumbar region   4. Chronic pain syndrome        Pain Score: Pre-procedure: 6 /10 Post-procedure: 3 /10   Pre-op H&P Assessment:  Brianna Arnold is a 31 y.o. (year old), female patient, seen today for interventional treatment. She  has a past surgical history that includes Cervical spine surgery (07/2014); Back surgery (10/2014); and Anterior cervical decomp/discectomy fusion (N/A, 02/11/2019). Brianna Arnold has a current medication list which includes the following prescription(s): albuterol, baclofen,  buprenorphine, vitamin d3, cyclobenzaprine, dicyclomine, ergocalciferol, famotidine, ferrous sulfate, gabapentin, medroxyprogesterone, meloxicam, methocarbamol, ofloxacin, promethazine, propranolol er, tramadol, venlafaxine xr, vitamin b-12, and zolpidem. Her primarily concern today is the Pain (Neck to hips)   Initial Vital Signs:  Pulse/HCG Rate: 94ECG Heart Rate: (!) 107 Temp: (!) 97.1 F (36.2 C) Resp: 16 BP: (!) 126/93 SpO2: 99 %  BMI: Estimated body mass index is 34.86 kg/m as calculated from the following:   Height as of this encounter: 4\' 7"  (1.397 m).   Weight as of this encounter: 150 lb (68 kg).  Risk Assessment: Allergies: Reviewed. She is allergic to hypafix [wound dressings], ativan [lorazepam], and tizanidine.  Allergy Precautions: None required Coagulopathies: Reviewed. None identified.  Blood-thinner therapy: None at this time Active Infection(s): Reviewed. None identified. Brianna Arnold  Arnold Confirmation: Brianna Arnold Procedure checklist: Completed Consent: Before the procedure and under the influence of no sedative(s), amnesic(s), or anxiolytics, the patient was informed of the treatment options, risks and possible complications. To fulfill our ethical and legal obligations, as recommended by the American Medical Association's Code of Ethics, I have informed the patient of my clinical impression; the nature and purpose of the treatment or procedure; the risks, benefits, and possible complications of the intervention; the alternatives, including doing nothing; the risk(s) and benefit(s) of the alternative treatment(s) or procedure(s); and the risk(s) and benefit(s) of doing nothing. The patient was provided information about the general risks and possible complications associated with the procedure. These may include, but are not limited to: failure to achieve desired goals, infection,  bleeding, organ or nerve damage, allergic reactions, paralysis, and death. In addition, the patient was informed of  those risks and complications associated to the procedure, such as failure to decrease pain; infection; bleeding; organ or nerve damage with subsequent damage to sensory, motor, and/or autonomic systems, resulting in permanent pain, numbness, and/or weakness of one or several areas of the body; allergic reactions; (i.e.: anaphylactic reaction); and/or death. Furthermore, the patient was informed of those risks and complications associated with the medications. These include, but are not limited to: allergic reactions (i.e.: anaphylactic or anaphylactoid reaction(s)); adrenal axis suppression; blood sugar elevation that in diabetics may result in ketoacidosis or comma; water retention that in patients with history of congestive heart failure may result in shortness of breath, pulmonary edema, and decompensation with resultant heart failure; weight gain; swelling or edema; medication-induced neural toxicity; particulate matter embolism and blood vessel occlusion with resultant organ, and/or nervous system infarction; and/or aseptic necrosis of one or more joints. Finally, the patient was informed that Medicine is not an exact science; therefore, there is also the possibility of unforeseen or unpredictable risks and/or possible complications that may result in a catastrophic outcome. The patient indicated having understood very clearly. We have given the patient no guarantees and we have made no promises. Enough time was given to the patient to ask questions, all of which were answered to the patient's satisfaction. Brianna Arnold has indicated that she wanted to continue with the procedure. Attestation: I, the ordering provider, attest that I have discussed with the patient the benefits, risks, side-effects, alternatives, likelihood of achieving goals, and potential problems during recovery for the  procedure that I have provided informed consent. Date  Time: 03/14/2022 10:32 AM  Pre-Procedure Preparation:  Monitoring: As per clinic protocol. Respiration, ETCO2, SpO2, BP, heart rate and rhythm monitor placed and checked for adequate function Safety Precautions: Patient was assessed for positional comfort and pressure points before starting the procedure. Time-out: I initiated and conducted the "Time-out" before starting the procedure, as per protocol. The patient was asked to participate by confirming the accuracy of the "Time Out" information. Verification of the correct person, Arnold, and procedure were performed and confirmed by me, the nursing staff, and the patient. "Time-out" conducted as per Joint Commission's Universal Protocol ([email or web address].01.01.01). Time: 1052  Description of Procedure:          Area Prepped: Entire Lower Lumbosacral Region & Right cervical region DuraPrep (Iodine Povacrylex [0.7% available iodine] and Isopropyl Alcohol, 74% w/w) Safety Precautions: Aspiration looking for blood return was conducted prior to all injections. At no point did we inject any substances, as a needle was being advanced. No attempts were made at seeking any paresthesias. Safe injection practices and needle disposal techniques used. Medications properly checked for expiration dates. SDV (single dose vial) medications used. Description of the Procedure: Protocol guidelines were followed. The patient was placed in position over the fluoroscopy table. The target area was identified and the area prepped in the usual manner. Skin & deeper tissues infiltrated with local anesthetic. Appropriate amount of time allowed to pass for local anesthetics to take effect. The procedure needles were then advanced to the target area. Proper needle placement secured. Negative aspiration confirmed. Solution injected in intermittent fashion, asking for systemic symptoms every 0.5cc of injectate. The needles were then removed and  the area cleansed, making sure to leave some of the prepping solution back to take advantage of its long term bactericidal properties.  Vitals:   03/14/22 1035 03/14/22 1051 03/14/22 1059  BP: (!) 126/93 (!) 134/112 (!) 131/90  Pulse: 94    Resp:  16 16 18   Temp: (!) 97.1 F (36.2 C)    TempSrc: Temporal    SpO2: 99% 98% 100%  Weight: 150 lb (68 kg)    Height: 4\' 7"  (1.397 m)         Start Time: 1052 hrs. End Time: 1057 hrs. Materials:  Needle(s) Type: Regular needle Gauge: 27G Length: 1.5-in  Medication(s): Please see orders for medications and dosing details.   17 trigger points were also injected in the right and left cervical, right and left trapezius, & lumbar paraspinal region with dry needling performed.  Each trigger point was injected with 0.5 to 1 cc of 0.2% ropivacaine.   Post-operative Assessment:  Post-procedure Vital Signs:  Pulse/HCG Rate: 94100 Temp: (!) 97.1 F (36.2 C) Resp: 18 BP: (!) 131/90 SpO2: 100 %  EBL: None  Complications: No immediate post-treatment complications observed by team, or reported by patient.  Note: The patient tolerated the entire procedure well. A repeat set of vitals were taken after the procedure and the patient was kept under observation following institutional policy, for this type of procedure. Post-procedural neurological assessment was performed, showing return to baseline, prior to discharge. The patient was provided with post-procedure discharge instructions, including a section on how to identify potential problems. Should any problems arise concerning this procedure, the patient was given instructions to immediately contact us, at any time, without hesitation. In any case, we plan to contact the patient by telephone for a follow-up status report regarding this interventional procedure.  Comments:  No additional relevant information.  Plan of Care   Medications ordered for procedure: Meds ordered this encounter   Medications   ropivacaine (PF) 2 mg/mL (0.2%) (NAROPIN) injection 9 mL   ropivacaine (PF) 2 mg/mL (0.2%) (NAROPIN) injection 9 mL    No orders of the defined types were placed in this encounter.    Medications administered: We administered ropivacaine (PF) 2 mg/mL (0.2%) and ropivacaine (PF) 2 mg/mL (0.2%).  See the medical record for exact dosing, route, and time of administration.  Follow-up plan:   Return if symptoms worsen or fail to improve.       Pharmacological management options:  Opioid Analgesics: Butrans patch, side effects at 7.5 mcg an hour, analgesic benefit with no side effects at 5 mcg an hour, continue  Membrane stabilizer: Continue gabapentin as prescribed  Muscle relaxant: Continue Flexeril as prescribed  NSAID: To be determined at a later time  Other analgesic(s): To be determined at a later time    Interventional management options: [name] was informed that there is no guarantee that she would be a candidate for interventional therapies. The decision will be based on the results of diagnostic studies, as well as Ms. Schlechter's risk profile.  Procedure(s) hx Left axillary peripheral nerve stimulation: 12/30/19 providing significant pain relief.  Removed 02/29/2020              Recent Visits Date Type Provider Dept  02/21/22 Procedure visit Edward JollyLateef, Dirk Vanaman, MD [facility]  01/24/22 Procedure visit Edward JollyLateef, Ketsia Linebaugh, MD [facility]  01/16/22 Office Visit Edward JollyLateef, Abri Vacca, MD [facility]  01/03/22 Procedure visit Edward JollyLateef, Annalyse Langlais, MD [facility]  Showing recent visits within past 90 days and meeting all other requirements Today's Visits Date Type Provider Dept  03/14/22 Procedure visit Edward JollyLateef, Merdis Snodgrass, MD [facility]  Showing today's visits and meeting all other requirements Future Appointments Date Type Provider Dept  04/25/22 Appointment Edward JollyLateef, Kaleel Schmieder, MD [facility]  05/08/22  Appointment Edward Jolly, MD  [facility]  Showing future appointments within next 90 days and meeting all other requirements  Disposition: Discharge home  Discharge (Date  Time): 03/14/2022; 1105 hrs.   Primary Care Physician: Marguarite Arbour, MD Location: [facility] Note by: Edward Jolly, MD Date: 03/14/2022; Time: 11:14 AM  Disclaimer:  Medicine is not an exact science. The only guarantee in medicine is that nothing is guaranteed. It is important to note that the decision to proceed with this intervention was based on the information collected from the patient. The Data and conclusions were drawn from the patient's questionnaire, the interview, and the physical examination. Because the information was provided in large part by the patient, it cannot be guaranteed that it has not been purposely or unconsciously manipulated. Every effort has been made to obtain as much relevant data as possible for this evaluation. It is important to note that the conclusions that lead to this procedure are derived in large part from the available data. Always take into account that the treatment will also be dependent on availability of resources and existing treatment guidelines, considered by other Pain Management Practitioners as being common knowledge and practice, at the time of the intervention. For Medico-Legal purposes, it is also important to point out that variation in procedural techniques and pharmacological choices are the acceptable norm. The indications, contraindications, technique, and results of the above procedure should only be interpreted and judged by a Board-Certified Interventional Pain Specialist with extensive familiarity and expertise in the same exact procedure and technique.

## 2022-03-14 NOTE — Progress Notes (Signed)
Safety precautions to be maintained throughout the outpatient stay will include: orient to surroundings, keep bed in low position, maintain call bell within reach at all times, provide assistance with transfer out of bed and ambulation.  

## 2022-03-14 NOTE — Patient Instructions (Signed)

## 2022-04-25 ENCOUNTER — Ambulatory Visit
Payer: Medicare HMO | Attending: Student in an Organized Health Care Education/Training Program | Admitting: Student in an Organized Health Care Education/Training Program

## 2022-04-25 ENCOUNTER — Encounter: Payer: Self-pay | Admitting: Student in an Organized Health Care Education/Training Program

## 2022-04-25 VITALS — BP 129/110 | HR 92 | Temp 95.1°F | Resp 18 | Ht <= 58 in | Wt 150.0 lb

## 2022-04-25 DIAGNOSIS — M7918 Myalgia, other site: Secondary | ICD-10-CM | POA: Insufficient documentation

## 2022-04-25 DIAGNOSIS — M542 Cervicalgia: Secondary | ICD-10-CM | POA: Diagnosis not present

## 2022-04-25 DIAGNOSIS — G894 Chronic pain syndrome: Secondary | ICD-10-CM | POA: Insufficient documentation

## 2022-04-25 MED ORDER — ROPIVACAINE HCL 2 MG/ML IJ SOLN
9.0000 mL | Freq: Once | INTRAMUSCULAR | Status: AC
  Administered 2022-04-25: 9 mL via PERINEURAL
  Filled 2022-04-25: qty 20

## 2022-04-25 NOTE — Patient Instructions (Signed)

## 2022-04-25 NOTE — Progress Notes (Signed)
PROVIDER NOTE: Information contained herein reflects review and annotations entered in association with encounter. Interpretation of such information and data should be left to medically-trained personnel. Information provided to patient can be located elsewhere in the medical record under "Patient Instructions". Document created using STT-dictation technology, any transcriptional errors that may result from process are unintentional.    Patient: Brianna Arnold  Service Category: Procedure  Provider: Gillis Santa, MD  DOB: 1991-02-23  DOS: 04/25/2022  Location: [facility]  MRN: [id number]  Setting: Ambulatory - outpatient  Referring Provider: Idelle Crouch, MD  Type: Established Patient  Specialty: Interventional Pain Management  PCP: Idelle Crouch, MD   Primary Reason for Visit: Interventional Pain Management Treatment. CC: Neck Pain (bilateral)   Procedure:          Anesthesia, Analgesia, Anxiolysis:  Type: Lumbar Paraspinal Muscle Trigger Point Injection (3+)         Quadratus lumborum, erector spinae, multifidus and periscapular region and bilateral cervical/trapezius TPI CPT: 20553 Primary Purpose: Therapeutic Approach: Percutaneous, ipsilateral approach. Laterality: Midline        Type: Local Anesthesia   Position: Prone   Indications: 1. Cervicalgia   2. Myofascial pain syndrome of lumbar spine   3. Chronic pain syndrome        Pain Score: Pre-procedure: 8 /10 Post-procedure: 8 /10   Pre-op H&P Assessment:  Brianna Arnold is a 32 y.o. (year old), female patient, seen today for interventional treatment. She  has a past surgical history that includes Cervical spine surgery (07/2014); Back surgery (10/2014); and Anterior cervical decomp/discectomy fusion (N/A, 02/11/2019). Brianna Arnold has a current medication list which includes the following prescription(s): albuterol, baclofen, buprenorphine, vitamin d3, cyclobenzaprine, dicyclomine,  ergocalciferol, famotidine, ferrous sulfate, gabapentin, medroxyprogesterone, meloxicam, methocarbamol, ofloxacin, promethazine, propranolol er, tramadol, venlafaxine xr, vitamin b-12, and zolpidem. Her primarily concern today is the Neck Pain (bilateral)   Initial Vital Signs:  Pulse/HCG Rate: 85  Temp: (!) 95.1 F (35.1 C) Resp: 18 BP: 121/86 SpO2: 100 %  BMI: Estimated body mass index is 34.86 kg/m as calculated from the following:   Height as of this encounter: 4\' 7"  (1.397 m).   Weight as of this encounter: 150 lb (68 kg).  Risk Assessment: Allergies: Reviewed. She is allergic to hypafix [wound dressings], ativan [lorazepam], and tizanidine.  Allergy Precautions: None required Coagulopathies: Reviewed. None identified.  Blood-thinner therapy: None at this time Active Infection(s): Reviewed. None identified. Brianna Arnold is afebrile  Site Confirmation: Brianna Arnold was asked to confirm the procedure and laterality before marking the site Procedure checklist: Completed Consent: Before the procedure and under the influence of no sedative(s), amnesic(s), or anxiolytics, the patient was informed of the treatment options, risks and possible complications. To fulfill our ethical and legal obligations, as recommended by the American Medical Association's Code of Ethics, I have informed the patient of my clinical impression; the nature and purpose of the treatment or procedure; the risks, benefits, and possible complications of the intervention; the alternatives, including doing nothing; the risk(s) and benefit(s) of the alternative treatment(s) or procedure(s); and the risk(s) and benefit(s) of doing nothing. The patient was provided information about the general risks and possible complications associated with the procedure. These may include, but are not limited to: failure to achieve desired goals, infection, bleeding, organ or nerve damage, allergic reactions, paralysis, and death. In  addition, the patient was informed of those risks and complications associated to the procedure, such as failure to decrease pain; infection;  bleeding; organ or nerve damage with subsequent damage to sensory, motor, and/or autonomic systems, resulting in permanent pain, numbness, and/or weakness of one or several areas of the body; allergic reactions; (i.e.: anaphylactic reaction); and/or death. Furthermore, the patient was informed of those risks and complications associated with the medications. These include, but are not limited to: allergic reactions (i.e.: anaphylactic or anaphylactoid reaction(s)); adrenal axis suppression; blood sugar elevation that in diabetics may result in ketoacidosis or comma; water retention that in patients with history of congestive heart failure may result in shortness of breath, pulmonary edema, and decompensation with resultant heart failure; weight gain; swelling or edema; medication-induced neural toxicity; particulate matter embolism and blood vessel occlusion with resultant organ, and/or nervous system infarction; and/or aseptic necrosis of one or more joints. Finally, the patient was informed that Medicine is not an exact science; therefore, there is also the possibility of unforeseen or unpredictable risks and/or possible complications that may result in a catastrophic outcome. The patient indicated having understood very clearly. We have given the patient no guarantees and we have made no promises. Enough time was given to the patient to ask questions, all of which were answered to the patient's satisfaction. Brianna Arnold has indicated that she wanted to continue with the procedure. Attestation: I, the ordering provider, attest that I have discussed with the patient the benefits, risks, side-effects, alternatives, likelihood of achieving goals, and potential problems during recovery for the procedure that I have provided informed consent. Date  Time: 04/25/2022 10:25  AM  Pre-Procedure Preparation:  Monitoring: As per clinic protocol. Respiration, ETCO2, SpO2, BP, heart rate and rhythm monitor placed and checked for adequate function Safety Precautions: Patient was assessed for positional comfort and pressure points before starting the procedure. Time-out: I initiated and conducted the "Time-out" before starting the procedure, as per protocol. The patient was asked to participate by confirming the accuracy of the "Time Out" information. Verification of the correct person, site, and procedure were performed and confirmed by me, the nursing staff, and the patient. "Time-out" conducted as per Joint Commission's Universal Protocol (UP.01.01.01). Time: 1043  Description of Procedure:          Area Prepped: Entire Lower Lumbosacral Region & Right cervical region DuraPrep (Iodine Povacrylex [0.7% available iodine] and Isopropyl Alcohol, 74% w/w) Safety Precautions: Aspiration looking for blood return was conducted prior to all injections. At no point did we inject any substances, as a needle was being advanced. No attempts were made at seeking any paresthesias. Safe injection practices and needle disposal techniques used. Medications properly checked for expiration dates. SDV (single dose vial) medications used. Description of the Procedure: Protocol guidelines were followed. The patient was placed in position over the fluoroscopy table. The target area was identified and the area prepped in the usual manner. Skin & deeper tissues infiltrated with local anesthetic. Appropriate amount of time allowed to pass for local anesthetics to take effect. The procedure needles were then advanced to the target area. Proper needle placement secured. Negative aspiration confirmed. Solution injected in intermittent fashion, asking for systemic symptoms every 0.5cc of injectate. The needles were then removed and the area cleansed, making sure to leave some of the prepping solution back to  take advantage of its long term bactericidal properties.  Vitals:   04/25/22 1028 04/25/22 1042 04/25/22 1047  BP: 121/86 (!) 125/105 (!) 129/110  Pulse: 85 90 92  Resp:  18 18  Temp: (!) 95.1 F (35.1 C)    TempSrc: Temporal  SpO2: 100% 100% 100%  Weight: 150 lb (68 kg)    Height: 4\' 7"  (1.397 m)         Start Time: 1043 hrs. End Time: 1049 hrs. Materials:  Needle(s) Type: Regular needle Gauge: 27G Length: 1.5-in  Medication(s): Please see orders for medications and dosing details.   15 trigger points were also injected in the right and left cervical, right and left trapezius, & lumbar paraspinal region with dry needling performed.  Each trigger point was injected with 0.5 to 1 cc of 0.2% ropivacaine.   Post-operative Assessment:  Post-procedure Vital Signs:  Pulse/HCG Rate: 92  Temp: (!) 95.1 F (35.1 C) Resp: 18 BP: (!) 129/110 SpO2: 100 %  EBL: None  Complications: No immediate post-treatment complications observed by team, or reported by patient.  Note: The patient tolerated the entire procedure well. A repeat set of vitals were taken after the procedure and the patient was kept under observation following institutional policy, for this type of procedure. Post-procedural neurological assessment was performed, showing return to baseline, prior to discharge. The patient was provided with post-procedure discharge instructions, including a section on how to identify potential problems. Should any problems arise concerning this procedure, the patient was given instructions to immediately contact , at any time, without hesitation. In any case, we plan to contact the patient by telephone for a follow-up status report regarding this interventional procedure.  Comments:  No additional relevant information.  Plan of Care   Medications ordered for procedure: Meds ordered this encounter  Medications   ropivacaine (PF) 2 mg/mL (0.2%) (NAROPIN) injection 9 mL    ropivacaine (PF) 2 mg/mL (0.2%) (NAROPIN) injection 9 mL    No orders of the defined types were placed in this encounter.    Medications administered: We administered ropivacaine (PF) 2 mg/mL (0.2%) and ropivacaine (PF) 2 mg/mL (0.2%).  See the medical record for exact dosing, route, and time of administration.  Follow-up plan:   Return if symptoms worsen or fail to improve.       Pharmacological management options:  Opioid Analgesics: Butrans patch, side effects at 7.5 mcg an hour, analgesic benefit with no side effects at 5 mcg an hour, continue  Membrane stabilizer: Continue gabapentin as prescribed  Muscle relaxant: Continue Flexeril as prescribed  NSAID: To be determined at a later time  Other analgesic(s): To be determined at a later time    Interventional management options: Ms. Dauphin was informed that there is no guarantee that she would be a candidate for interventional therapies. The decision will be based on the results of diagnostic studies, as well as Ms. Kirn's risk profile.  Procedure(s) hx Left axillary peripheral nerve stimulation: 12/30/19 providing significant pain relief.  Removed 02/29/2020              Recent Visits Date Type Provider Dept  03/14/22 Procedure visit 03/16/22, MD [facility]  02/21/22 Procedure visit 13/01/23, MD [facility]  Showing recent visits within past 90 days and meeting all other requirements Today's Visits Date Type Provider Dept  04/25/22 Procedure visit 06/24/22, MD [facility]  Showing today's visits and meeting all other requirements Future Appointments Date Type Provider Dept  05/08/22 Appointment 05/10/22, MD [facility]  05/23/22 Appointment 05/25/22, MD [facility]  Showing future appointments within next 90 days and meeting all other requirements  Disposition: Discharge home  Discharge (Date  Time): 04/25/2022; 1100 hrs.   Primary Care  Physician: Idelle Crouch, MD Location: [facility] Note by: Gillis Santa, MD Date: 04/25/2022; Time: 11:07 AM  Disclaimer:  Medicine is not an exact science. The only guarantee in medicine is that nothing is guaranteed. It is important to note that the decision to proceed with this intervention was based on the information collected from the patient. The Data and conclusions were drawn from the patient's questionnaire, the interview, and the physical examination. Because the information was provided in large part by the patient, it cannot be guaranteed that it has not been purposely or unconsciously manipulated. Every effort has been made to obtain as much relevant data as possible for this evaluation. It is important to note that the conclusions that lead to this procedure are derived in large part from the available data. Always take into account that the treatment will also be dependent on availability of resources and existing treatment guidelines, considered by other Pain Management Practitioners as being common knowledge and practice, at the time of the intervention. For Medico-Legal purposes, it is also important to point out that variation in procedural techniques and pharmacological choices are the acceptable norm. The indications, contraindications, technique, and results of the above procedure should only be interpreted and judged by a Board-Certified Interventional Pain Specialist with extensive familiarity and expertise in the same exact procedure and technique.

## 2022-04-25 NOTE — Progress Notes (Signed)
Safety precautions to be maintained throughout the outpatient stay will include: orient to surroundings, keep bed in low position, maintain call bell within reach at all times, provide assistance with transfer out of bed and ambulation.  

## 2022-04-26 ENCOUNTER — Telehealth: Payer: Self-pay

## 2022-04-26 NOTE — Telephone Encounter (Signed)
Post procedure follow up.  LM 

## 2022-05-01 DIAGNOSIS — Z3042 Encounter for surveillance of injectable contraceptive: Secondary | ICD-10-CM | POA: Diagnosis not present

## 2022-05-08 ENCOUNTER — Ambulatory Visit
Payer: Medicare HMO | Attending: Student in an Organized Health Care Education/Training Program | Admitting: Student in an Organized Health Care Education/Training Program

## 2022-05-08 ENCOUNTER — Encounter: Payer: Self-pay | Admitting: Student in an Organized Health Care Education/Training Program

## 2022-05-08 VITALS — BP 127/92 | HR 91 | Temp 97.3°F | Ht <= 58 in | Wt 150.0 lb

## 2022-05-08 DIAGNOSIS — M4105 Infantile idiopathic scoliosis, thoracolumbar region: Secondary | ICD-10-CM | POA: Insufficient documentation

## 2022-05-08 DIAGNOSIS — M25552 Pain in left hip: Secondary | ICD-10-CM | POA: Diagnosis not present

## 2022-05-08 DIAGNOSIS — Z981 Arthrodesis status: Secondary | ICD-10-CM | POA: Insufficient documentation

## 2022-05-08 DIAGNOSIS — G894 Chronic pain syndrome: Secondary | ICD-10-CM | POA: Diagnosis not present

## 2022-05-08 DIAGNOSIS — G8929 Other chronic pain: Secondary | ICD-10-CM | POA: Diagnosis not present

## 2022-05-08 DIAGNOSIS — M7918 Myalgia, other site: Secondary | ICD-10-CM | POA: Diagnosis not present

## 2022-05-08 DIAGNOSIS — M67912 Unspecified disorder of synovium and tendon, left shoulder: Secondary | ICD-10-CM

## 2022-05-08 DIAGNOSIS — M542 Cervicalgia: Secondary | ICD-10-CM | POA: Diagnosis not present

## 2022-05-08 MED ORDER — BUPRENORPHINE 5 MCG/HR TD PTWK: 1.0000 | MEDICATED_PATCH | TRANSDERMAL | 3 refills | Status: DC

## 2022-05-08 NOTE — Progress Notes (Signed)
Nursing Pain Medication Assessment:  Safety precautions to be maintained throughout the outpatient stay will include: orient to surroundings, keep bed in low position, maintain call bell within reach at all times, provide assistance with transfer out of bed and ambulation.  Medication Inspection Compliance: Pill count conducted under aseptic conditions, in front of the patient. Neither the pills nor the bottle was removed from the patient's sight at any time. Once count was completed pills were immediately returned to the patient in their original bottle.  Medication: Buprenorphine (Suboxone) Pill/Patch Count:  2 of 4 pills remain Pill/Patch Appearance: Markings consistent with prescribed medication Bottle Appearance: Standard pharmacy container. Clearly labeled. Filled Date: 40 / 26 / 2023 Last Medication intake:  TodaySafety precautions to be maintained throughout the outpatient stay will include: orient to surroundings, keep bed in low position, maintain call bell within reach at all times, provide assistance with transfer out of bed and ambulation.

## 2022-05-08 NOTE — Progress Notes (Signed)
PROVIDER NOTE: Information contained herein reflects review and annotations entered in association with encounter. Interpretation of such information and data should be left to medically-trained personnel. Information provided to patient can be located elsewhere in the medical record under "Patient Instructions". Document created using STT-dictation technology, any transcriptional errors that may result from process are unintentional.    Patient: Brianna Arnold  Service Category: E/M  Provider: Edward Jolly, MD  DOB: [birth year]  DOS: 05/08/2022  Specialty: Interventional Pain Management  MRN: [id number]  Setting: Ambulatory outpatient  PCP: Marguarite Arbour, MD  Type: Established Patient    Referring Provider: Marguarite Arbour, MD  Location: Office  Delivery: Face-to-face     HPI  [name], a 32 y.o. year old female, is here today because of her Cervicalgia [M54.2]. Ms. Hale's primary complain today is Neck Pain (left) Last encounter: My last encounter with her was on 01/16/22 Pertinent problems: Ms. Heying has Status post cervical spinal fusion; Migraine without aura and without status migrainosus, not intractable; History of lumbar spinal fusion (T11- iliac); Cervicalgia; Chronic left shoulder pain; Disorder of left rotator cuff; and Chronic pain syndrome on their pertinent problem list. Pain Assessment: Severity of Chronic pain is reported as a 6 /10. Location: Neck Left/pain radiaities down her arm. Onset: More than a month ago. Quality: Aching, Constant, Burning. Timing: Constant. Modifying factor(s): patches, laying down, procedures, meds. Vitals:  height is 4\' 8"  (1.422 m) and weight is 150 lb (68 kg). Her temperature is 97.3 F (36.3 C) (abnormal). Her blood pressure is 127/92 (abnormal) and her pulse is 91. Her oxygen saturation is 99%.   Reason for encounter: medication management.  No change in medical history since last visit.  Patient's pain is at  baseline.  Patient continues multimodal pain regimen as prescribed: Butrans patch 5 mcg an hour..  States that it provides pain relief and improvement in functional status. Finds benefit with every 3 to every 4 week cervical, thoracic, lumbar TPI's.   Pharmacotherapy Assessment  Analgesic: Butrans 5 mcg/hr q 7days   Monitoring: [place] PMP: PDMP reviewed during this encounter.       Pharmacotherapy: No side-effects or adverse reactions reported. Compliance: No problems identified. Effectiveness: Clinically acceptable.  , RN  05/08/2022  8:35 AM  Sign when Signing Visit Nursing Pain Medication Assessment:  Safety precautions to be maintained throughout the outpatient stay will include: orient to surroundings, keep bed in low position, maintain call bell within reach at all times, provide assistance with transfer out of bed and ambulation.  Medication Inspection Compliance: Pill count conducted under aseptic conditions, in front of the patient. Neither the pills nor the bottle was removed from the patient's sight at any time. Once count was completed pills were immediately returned to the patient in their original bottle.  Medication: Buprenorphine (Suboxone) Pill/Patch Count:  2 of 4 pills remain Pill/Patch Appearance: Markings consistent with prescribed medication Bottle Appearance: Standard pharmacy container. Clearly labeled. Filled Date: 58 / 26 / 2023 Last Medication intake:  TodaySafety precautions to be maintained throughout the outpatient stay will include: orient to surroundings, keep bed in low position, maintain call bell within reach at all times, provide assistance with transfer out of bed and ambulation.     UDS:  Summary  Date Value Ref Range Status  10/15/2019 Note  Final    Comment:    ==================================================================== Compliance Drug Analysis, Ur ==================================================================== Test  Result       Flag       Units  Drug Absent but Declared for Prescription Verification   Buprenorphine                  Not Detected UNEXPECTED ng/mg creat    Transdermal buprenorphine, as indicated in the declared medication    list, is not always detected even when used as directed.    Tramadol                       Not Detected UNEXPECTED ng/mg creat   Gabapentin                     Not Detected UNEXPECTED   Cyclobenzaprine                Not Detected UNEXPECTED   Trazodone                      Not Detected UNEXPECTED   Promethazine                   Not Detected UNEXPECTED ==================================================================== Test                      Result    Flag   Units      Ref Range   Creatinine              91               mg/dL      >=20 ==================================================================== Declared Medications:  The flagging and interpretation on this report are based on the  following declared medications.  Unexpected results may arise from  inaccuracies in the declared medications.   **Note: The testing scope of this panel includes these medications:   Cyclobenzaprine (Flexeril)  Gabapentin (Neurontin)  Promethazine (Phenergan)  Tramadol (Ultram)  Trazodone (Desyrel)   **Note: The testing scope of this panel does not include small to  moderate amounts of these reported medications:   Buprenorphine Patch (BuTrans)   **Note: The testing scope of this panel does not include the  following reported medications:   Albuterol (Ventolin HFA)  Ethinyl Estradiol (Sprintec)  Famotidine (Pepcid)  Iron  Norgestimate (Sprintec)  Vitamin B12  Vitamin D3 ==================================================================== For clinical consultation, please call [phone]. ====================================================================      ROS  Constitutional: Denies any fever or chills Gastrointestinal:  No reported hemesis, hematochezia, vomiting, or acute GI distress Musculoskeletal:  Chronic cervical, thoracic, lumbar spine pain, left hip pain Neurological: No reported episodes of acute onset apraxia, aphasia, dysarthria, agnosia, amnesia, paralysis, loss of coordination, or loss of consciousness  Medication Review  Vitamin D3, albuterol, baclofen, buprenorphine, cyclobenzaprine, dicyclomine, ergocalciferol, famotidine, ferrous sulfate, gabapentin, medroxyPROGESTERone, meloxicam, methocarbamol, ofloxacin, promethazine, propranolol ER, traMADol, venlafaxine XR, vitamin B-12, and zolpidem  History Review  Allergy: Ms. Lahaie is allergic to hypafix [wound dressings], ativan [lorazepam], and tizanidine. Drug: Ms. Brizuela  reports no history of drug use. Alcohol:  has no history on file for alcohol use. Tobacco:  reports that she has never smoked. She has never used smokeless tobacco. Social: Ms. Mannes  reports that she has never smoked. She has never used smokeless tobacco. She reports that she does not use drugs. Medical:  has a past medical history of Anemia and Jarcho-Levin syndrome. Surgical: Ms. Higby  has a past surgical history that includes Cervical spine surgery (07/2014); Back surgery (  10/2014); and Anterior cervical decomp/discectomy fusion (N/A, 02/11/2019). Family: family history includes Hypertension in her father.  Laboratory Chemistry Profile   Renal Lab Results  Component Value Date   BUN 6 02/15/2019   CREATININE 0.63 02/15/2019   GFRAA >60 02/15/2019   GFRNONAA >60 02/15/2019    Hepatic Lab Results  Component Value Date   AST 17 02/14/2019   ALT 12 02/14/2019   ALBUMIN 3.9 02/14/2019   ALKPHOS 55 02/14/2019   LIPASE 18 02/14/2019    Electrolytes Lab Results  Component Value Date   NA 137 02/15/2019   K 4.1 02/15/2019   CL 106 02/15/2019   CALCIUM 8.1 (L) 02/15/2019    Bone No results found for: "VD25OH", "VD125OH2TOT", "JD5520EY2", "MV3612AE4",  "25OHVITD1", "25OHVITD2", "25OHVITD3", "TESTOFREE", "TESTOSTERONE"  Inflammation (CRP: Acute Phase) (ESR: Chronic Phase) Lab Results  Component Value Date   LATICACIDVEN 1.6 04/19/2015         Note: Above Lab results reviewed.  Recent Imaging Review  [place] LIMITED JOINT SPACE STRUCTURES LOW BILAT Procedure:  Injection of right hip joint under ultrasound guidance. Ultrasound guidance utilized for in-plane approach to the right hip joint,  no effusion noted Samsung HS60 device utilized with permanent recording / reporting. Verbal informed consent obtained and verified. Skin prepped in a sterile fashion. Ethyl chloride for topical local analgesia.  Completed without difficulty and tolerated well. Medication: triamcinolone acetonide 40 mg/mL suspension for injection 1 mL  total and 2 mL lidocaine 1% without epinephrine utilized for needle  placement anesthetic Advised to contact for fevers/chills, erythema, induration, drainage, or  persistent bleeding.  Procedure:  Injection of right greater trochanter under ultrasound  guidance. Ultrasound guidance utilized for out of plane approach to the right  greater trochanter, no clearly visualized sonographic evidence of  tendinopathy Samsung HS60 device utilized with permanent recording / reporting. Verbal informed consent obtained and verified. Skin prepped in a sterile fashion. Ethyl chloride for topical local analgesia.  Completed without difficulty and tolerated well. Medication: triamcinolone acetonide 40 mg/mL suspension for injection 1 mL  total and 2 mL lidocaine 1% without epinephrine utilized for needle  placement anesthetic Advised to contact for fevers/chills, erythema, induration, drainage, or  persistent bleeding.  Procedure:  Injection of left hip joint under ultrasound guidance. Ultrasound guidance utilized for in-plane approach to left hip joint, no  effusion visualized Samsung HS60 device utilized with permanent  recording / reporting. Verbal informed consent obtained and verified. Skin prepped in a sterile fashion. Ethyl chloride for topical local analgesia.  Completed without difficulty and tolerated well. Medication: triamcinolone acetonide 40 mg/mL suspension for injection 1 mL  total and 2 mL lidocaine 1% without epinephrine utilized for needle  placement anesthetic Advised to contact for fevers/chills, erythema, induration, drainage, or  persistent bleeding.  Procedure:  Injection of left greater trochanter under ultrasound  guidance. Ultrasound guidance utilized for out of plane approach to left greater  trochanteric region, no effusion Samsung HS60 device utilized with permanent recording / reporting. Verbal informed consent obtained and verified. Skin prepped in a sterile fashion. Ethyl chloride for topical local analgesia.  Completed without difficulty and tolerated well. Medication: triamcinolone acetonide 40 mg/mL suspension for injection 1 mL  total and 2 mL lidocaine 1% without epinephrine utilized for needle  placement anesthetic Advised to contact for fevers/chills, erythema, induration, drainage, or  persistent bleeding. Note: Reviewed        Physical Exam  General appearance: Well nourished, well developed, and well hydrated. In no apparent acute distress  Mental status: Alert, oriented x 3 (person, place, & time)       Respiratory: No evidence of acute respiratory distress Eyes: [name] Vitals: BP (!) 127/92   Pulse 91   Temp (!) 97.3 F (36.3 C)   Ht 4\' 8"  (1.422 m)   Wt 150 lb (68 kg)   SpO2 99%   BMI 33.63 kg/m  BMI: Estimated body mass index is 33.63 kg/m as calculated from the following:   Height as of this encounter: 4\' 8"  (1.422 m).   Weight as of this encounter: 150 lb (68 kg). Ideal: Patient must be at least 60 in tall to calculate ideal body weight  Cervical Spine Exam  Skin & Axial Inspection: Well healed scar from previous spine surgery  detected Alignment: Asymmetric Functional ROM: Pain restricted ROM      Stability: No instability detected Muscle Tone/Strength: Functionally intact. No obvious neuro-muscular anomalies detected. Sensory (Neurological): Neuropathic pain pattern Palpation: No palpable anomalies                 Thoracic Spine Area Exam  Skin & Axial Inspection: Well healed scar from previous spine surgery detected Alignment: Symmetrical Functional ROM: Mechanically restricted ROM Stability: No instability detected Muscle Tone/Strength: Functionally intact. No obvious neuro-muscular anomalies detected. Sensory (Neurological): Neurogenic pain pattern Muscle strength & Tone: No palpable anomalies   Lumbar Exam  Skin & Axial Inspection: Well healed scar from previous spine surgery detected Alignment: Scoliosis detected, significant Functional ROM: Pain restricted ROM       Stability: No instability detected Muscle Tone/Strength: Functionally intact. No obvious neuro-muscular anomalies detected. Sensory (Neurological):  Musculoskeletal pain pattern Palpation: Complains of area being tender to palpation         Gait & Posture Assessment  Ambulation: Limited Gait: Antalgic Posture: Difficulty standing up straight, due to pain    Lower Extremity Exam      Side: Right lower extremity   Side: Left lower extremity  Stability: No instability observed           Stability: No instability observed          Skin & Extremity Inspection: Skin color, temperature, and hair growth are WNL. No peripheral edema or cyanosis. No masses, redness, swelling, asymmetry, or associated skin lesions. No contractures.   Skin & Extremity Inspection: Skin color, temperature, and hair growth are WNL. No peripheral edema or cyanosis. No masses, redness, swelling, asymmetry, or associated skin lesions. No contractures.  Functional ROM: Pain restricted ROM of the left hip, pain overlying SI joint and piriformis                    Functional ROM: Pain restricted ROM                  Muscle Tone/Strength: Functionally intact. No obvious neuro-muscular anomalies detected.   Muscle Tone/Strength: Functionally intact. No obvious neuro-muscular anomalies detected.  Sensory (Neurological): Musculoskeletal       Sensory (Neurological): Unimpaired        DTR: Patellar: deferred today Achilles: deferred today Plantar: deferred today   DTR: Patellar: deferred today Achilles: deferred today Plantar: deferred today  Palpation: No palpable anomalies   Palpation: No palpable anomalies      Assessment   Diagnosis Status  1. Cervicalgia   2. Myofascial pain syndrome of lumbar spine   3. Infantile idiopathic scoliosis of thoracolumbar region   4. Chronic left hip pain   5. History of lumbar spinal fusion (T11- iliac)  6. Disorder of left rotator cuff   7. Chronic pain syndrome     Controlled Controlled Controlled     Plan of Care    Ms. Lovely Kerins Dimaio has a current medication list which includes the following long-term medication(s): albuterol, dicyclomine, famotidine, ferrous sulfate, gabapentin, medroxyprogesterone, promethazine, propranolol er, and zolpidem.  Pharmacotherapy (Medications Ordered): Meds ordered this encounter  Medications   buprenorphine (BUTRANS) 5 MCG/HR PTWK    Sig: Place 1 patch onto the skin once a week.    Dispense:  4 patch    Refill:  3    Chronic Pain: STOP Act (Not applicable) Fill 1 day early if closed on refill date. Avoid benzodiazepines within 8 hours of opioids    Follow-up plan:   Return in about 4 months (around 09/06/2022) for Medication Management, in person.     Pharmacological management options:  Opioid Analgesics: Butrans patch, side effects at 7.5 mcg an hour, analgesic benefit with no side effects at 5 mcg an hour, continue  Membrane stabilizer: Continue gabapentin as prescribed  Muscle relaxant: Continue Flexeril as prescribed  NSAID: To be determined  at a later time  Other analgesic(s): To be determined at a later time    Interventional management options: Ms. Zubiate was informed that there is no guarantee that she would be a candidate for interventional therapies. The decision will be based on the results of diagnostic studies, as well as Ms. Pembroke's risk profile.  Procedure(s) hx Left axillary peripheral nerve stimulation: 12/30/19 providing significant pain relief.  Removed 02/29/2020               Recent Visits Date Type Provider Dept  04/25/22 Procedure visit Gillis Santa, MD [facility]  03/14/22 Procedure visit Gillis Santa, MD [facility]  02/21/22 Procedure visit Gillis Santa, MD [facility]  Showing recent visits within past 90 days and meeting all other requirements Today's Visits Date Type Provider Dept  05/08/22 Office Visit Gillis Santa, MD [facility]  Showing today's visits and meeting all other requirements Future Appointments Date Type Provider Dept  05/23/22 Appointment Gillis Santa, MD [facility]  Showing future appointments within next 90 days and meeting all other requirements  I discussed the assessment and treatment plan with the patient. The patient was provided an opportunity to ask questions and all were answered. The patient agreed with the plan and demonstrated an understanding of the instructions.  Patient advised to call back or seek an in-person evaluation if the symptoms or condition worsens.  Duration of encounter: 7minutes.  Note by: Gillis Santa, MD Date: 05/08/2022; Time: 8:54 AM

## 2022-05-21 DIAGNOSIS — M25552 Pain in left hip: Secondary | ICD-10-CM | POA: Diagnosis not present

## 2022-05-21 DIAGNOSIS — M25551 Pain in right hip: Secondary | ICD-10-CM | POA: Diagnosis not present

## 2022-05-23 ENCOUNTER — Ambulatory Visit
Payer: Medicare HMO | Attending: Student in an Organized Health Care Education/Training Program | Admitting: Student in an Organized Health Care Education/Training Program

## 2022-05-23 ENCOUNTER — Encounter: Payer: Self-pay | Admitting: Student in an Organized Health Care Education/Training Program

## 2022-05-23 VITALS — BP 137/98 | HR 99 | Temp 97.3°F | Resp 18 | Ht <= 58 in | Wt 148.0 lb

## 2022-05-23 DIAGNOSIS — M7918 Myalgia, other site: Secondary | ICD-10-CM | POA: Diagnosis not present

## 2022-05-23 DIAGNOSIS — M542 Cervicalgia: Secondary | ICD-10-CM | POA: Insufficient documentation

## 2022-05-23 MED ORDER — DIAZEPAM 5 MG PO TABS
5.0000 mg | ORAL_TABLET | ORAL | Status: AC
  Administered 2022-05-23: 5 mg via ORAL

## 2022-05-23 MED ORDER — DIAZEPAM 5 MG PO TABS
ORAL_TABLET | ORAL | Status: AC
  Filled 2022-05-23: qty 1

## 2022-05-23 MED ORDER — ROPIVACAINE HCL 2 MG/ML IJ SOLN
9.0000 mL | Freq: Once | INTRAMUSCULAR | Status: AC
  Administered 2022-05-23: 9 mL via PERINEURAL

## 2022-05-23 MED ORDER — ROPIVACAINE HCL 2 MG/ML IJ SOLN
INTRAMUSCULAR | Status: AC
  Filled 2022-05-23: qty 20

## 2022-05-23 NOTE — Progress Notes (Signed)
PROVIDER NOTE: Information contained herein reflects review and annotations entered in association with encounter. Interpretation of such information and data should be left to medically-trained personnel. Information provided to patient can be located elsewhere in the medical record under "Patient Instructions". Document created using STT-dictation technology, any transcriptional errors that may result from process are unintentional.    Patient: Brianna Arnold  Service Category: Procedure  Provider: Gillis Santa, MD  DOB: 02-22-1991  DOS: 05/23/2022  Location: [facility]  MRN: [id number]  Setting: Ambulatory - outpatient  Referring Provider: Idelle Crouch, MD  Type: Established Patient  Specialty: Interventional Pain Management  PCP: Idelle Crouch, MD   Primary Reason for Visit: Interventional Pain Management Treatment. CC: Neck Pain and Shoulder Pain   Procedure:          Anesthesia, Analgesia, Anxiolysis:  Type: Lumbar Paraspinal Muscle Trigger Point Injection (3+)         Quadratus lumborum, erector spinae, multifidus and periscapular region and bilateral cervical/trapezius TPI CPT: 20553 Primary Purpose: Therapeutic Approach: Percutaneous, ipsilateral approach. Laterality: Midline        Type: Local Anesthesia   Position: Prone   Indications: 1. Cervicalgia   2. Myofascial pain syndrome of lumbar spine        Pain Score: Pre-procedure: 5 /10 Post-procedure: 5 /10   Pre-op H&P Assessment:  Brianna Arnold is a 32 y.o. (year old), female patient, seen today for interventional treatment. She  has a past surgical history that includes Cervical spine surgery (07/2014); Back surgery (10/2014); and Anterior cervical decomp/discectomy fusion (N/A, 02/11/2019). Brianna Arnold has a current medication list which includes the following prescription(s): albuterol, baclofen, buprenorphine, vitamin d3, cyclobenzaprine, dicyclomine, ergocalciferol, famotidine, ferrous  sulfate, gabapentin, medroxyprogesterone, meloxicam, methocarbamol, ofloxacin, promethazine, propranolol er, tramadol, venlafaxine xr, vitamin b-12, and zolpidem, and the following Facility-Administered Medications: ropivacaine (pf) 2 mg/ml (0.2%) and ropivacaine (pf) 2 mg/ml (0.2%). Her primarily concern today is the Neck Pain and Shoulder Pain   Initial Vital Signs:  Pulse/HCG Rate: 99  Temp: (!) 97.3 F (36.3 C) Resp: 16 BP: 125/88 SpO2: 100 %  BMI: Estimated body mass index is 34.4 kg/m as calculated from the following:   Height as of this encounter: 4\' 7"  (1.397 m).   Weight as of this encounter: 148 lb (67.1 kg).  Risk Assessment: Allergies: Reviewed. She is allergic to hypafix [wound dressings], ativan [lorazepam], and tizanidine.  Allergy Precautions: None required Coagulopathies: Reviewed. None identified.  Blood-thinner therapy: None at this time Active Infection(s): Reviewed. None identified. Brianna Arnold is afebrile  Site Confirmation: Brianna Arnold was asked to confirm the procedure and laterality before marking the site Procedure checklist: Completed Consent: Before the procedure and under the influence of no sedative(s), amnesic(s), or anxiolytics, the patient was informed of the treatment options, risks and possible complications. To fulfill our ethical and legal obligations, as recommended by the American Medical Association's Code of Ethics, I have informed the patient of my clinical impression; the nature and purpose of the treatment or procedure; the risks, benefits, and possible complications of the intervention; the alternatives, including doing nothing; the risk(s) and benefit(s) of the alternative treatment(s) or procedure(s); and the risk(s) and benefit(s) of doing nothing. The patient was provided information about the general risks and possible complications associated with the procedure. These may include, but are not limited to: failure to achieve desired goals,  infection, bleeding, organ or nerve damage, allergic reactions, paralysis, and death. In addition, the patient was informed of those  risks and complications associated to the procedure, such as failure to decrease pain; infection; bleeding; organ or nerve damage with subsequent damage to sensory, motor, and/or autonomic systems, resulting in permanent pain, numbness, and/or weakness of one or several areas of the body; allergic reactions; (i.e.: anaphylactic reaction); and/or death. Furthermore, the patient was informed of those risks and complications associated with the medications. These include, but are not limited to: allergic reactions (i.e.: anaphylactic or anaphylactoid reaction(s)); adrenal axis suppression; blood sugar elevation that in diabetics may result in ketoacidosis or comma; water retention that in patients with history of congestive heart failure may result in shortness of breath, pulmonary edema, and decompensation with resultant heart failure; weight gain; swelling or edema; medication-induced neural toxicity; particulate matter embolism and blood vessel occlusion with resultant organ, and/or nervous system infarction; and/or aseptic necrosis of one or more joints. Finally, the patient was informed that Medicine is not an exact science; therefore, there is also the possibility of unforeseen or unpredictable risks and/or possible complications that may result in a catastrophic outcome. The patient indicated having understood very clearly. We have given the patient no guarantees and we have made no promises. Enough time was given to the patient to ask questions, all of which were answered to the patient's satisfaction. Brianna Arnold has indicated that she wanted to continue with the procedure. Attestation: I, the ordering provider, attest that I have discussed with the patient the benefits, risks, side-effects, alternatives, likelihood of achieving goals, and potential problems during recovery  for the procedure that I have provided informed consent. Date  Time: 05/23/2022  2:10 PM  Pre-Procedure Preparation:  Monitoring: As per clinic protocol. Respiration, ETCO2, SpO2, BP, heart rate and rhythm monitor placed and checked for adequate function Safety Precautions: Patient was assessed for positional comfort and pressure points before starting the procedure. Time-out: I initiated and conducted the "Time-out" before starting the procedure, as per protocol. The patient was asked to participate by confirming the accuracy of the "Time Out" information. Verification of the correct person, site, and procedure were performed and confirmed by me, the nursing staff, and the patient. "Time-out" conducted as per Joint Commission's Universal Protocol (UP.01.01.01). Time: 1449  Description of Procedure:          Area Prepped: Entire Lower Lumbosacral Region & Right cervical region DuraPrep (Iodine Povacrylex [0.7% available iodine] and Isopropyl Alcohol, 74% w/w) Safety Precautions: Aspiration looking for blood return was conducted prior to all injections. At no point did we inject any substances, as a needle was being advanced. No attempts were made at seeking any paresthesias. Safe injection practices and needle disposal techniques used. Medications properly checked for expiration dates. SDV (single dose vial) medications used. Description of the Procedure: Protocol guidelines were followed. The patient was placed in position over the fluoroscopy table. The target area was identified and the area prepped in the usual manner. Skin & deeper tissues infiltrated with local anesthetic. Appropriate amount of time allowed to pass for local anesthetics to take effect. The procedure needles were then advanced to the target area. Proper needle placement secured. Negative aspiration confirmed. Solution injected in intermittent fashion, asking for systemic symptoms every 0.5cc of injectate. The needles were then  removed and the area cleansed, making sure to leave some of the prepping solution back to take advantage of its long term bactericidal properties.  Vitals:   05/23/22 1435 05/23/22 1445 05/23/22 1450 05/23/22 1455  BP: (!) 143/104 (!) 133/105 (!) 140/112 (!) 137/98  Pulse: 98 (!)  103 100 99  Resp: 18 15 18 18   Temp:      SpO2: 99% 99% 99% 99%  Weight:      Height:           Start Time: 1449 hrs. End Time: 1454 hrs. Materials:  Needle(s) Type: Regular needle Gauge: 27G Length: 1.5-in  Medication(s): Please see orders for medications and dosing details.   15 trigger points were also injected in the right and left cervical, right and left trapezius, & lumbar paraspinal region with dry needling performed.  Each trigger point was injected with 0.5 to 1 cc of 0.2% ropivacaine.   Post-operative Assessment:  Post-procedure Vital Signs:  Pulse/HCG Rate: 99  Temp: (!) 97.3 F (36.3 C) Resp: 18 BP: (!) 137/98 SpO2: 99 %  EBL: None  Complications: No immediate post-treatment complications observed by team, or reported by patient.  Note: The patient tolerated the entire procedure well. A repeat set of vitals were taken after the procedure and the patient was kept under observation following institutional policy, for this type of procedure. Post-procedural neurological assessment was performed, showing return to baseline, prior to discharge. The patient was provided with post-procedure discharge instructions, including a section on how to identify potential problems. Should any problems arise concerning this procedure, the patient was given instructions to immediately contact us, at any time, without hesitation. In any case, we plan to contact the patient by telephone for a follow-up status report regarding this interventional procedure.  Comments:  No additional relevant information.  Plan of Care   Medications ordered for procedure: Meds ordered this encounter  Medications    ropivacaine (PF) 2 mg/mL (0.2%) (NAROPIN) injection 9 mL   ropivacaine (PF) 2 mg/mL (0.2%) (NAROPIN) injection 9 mL    No orders of the defined types were placed in this encounter.    Medications administered: Naomie Dean. Landenberger "Sharyn Lull" had no medications administered during this visit.  See the medical record for exact dosing, route, and time of administration.  Follow-up plan:   No follow-ups on file.       Pharmacological management options:  Opioid Analgesics: Butrans patch, side effects at 7.5 mcg an hour, analgesic benefit with no side effects at 5 mcg an hour, continue  Membrane stabilizer: Continue gabapentin as prescribed  Muscle relaxant: Continue Flexeril as prescribed  NSAID: To be determined at a later time  Other analgesic(s): To be determined at a later time    Interventional management options: Ms. Kemnitz was informed that there is no guarantee that she would be a candidate for interventional therapies. The decision will be based on the results of diagnostic studies, as well as Ms. Martis's risk profile.  Procedure(s) hx Left axillary peripheral nerve stimulation: 12/30/19 providing significant pain relief.  Removed 02/29/2020              Recent Visits Date Type Provider Dept  05/08/22 Office Visit Gillis Santa, MD [facility]  04/25/22 Procedure visit Gillis Santa, MD [facility]  03/14/22 Procedure visit Gillis Santa, MD [facility]  Showing recent visits within past 90 days and meeting all other requirements Today's Visits Date Type Provider Dept  05/23/22 Procedure visit Gillis Santa, MD [facility]  Showing today's visits and meeting all other requirements Future Appointments Date Type Provider Dept  06/13/22 Appointment Gillis Santa, MD [facility]  Showing future appointments within next 90 days and meeting all other requirements  Disposition: Discharge home  Discharge (Date  Time):  05/23/2022; 1504 hrs.   Primary Care Physician: Idelle Crouch, MD Location: [facility] Note by: Gillis Santa, MD Date: 05/23/2022; Time: 3:09 PM  Disclaimer:  Medicine is not an exact science. The only guarantee in medicine is that nothing is guaranteed. It is important to note that the decision to proceed with this intervention was based on the information collected from the patient. The Data and conclusions were drawn from the patient's questionnaire, the interview, and the physical examination. Because the information was provided in large part by the patient, it cannot be guaranteed that it has not been purposely or unconsciously manipulated. Every effort has been made to obtain as much relevant data as possible for this evaluation. It is important to note that the conclusions that lead to this procedure are derived in large part from the available data. Always take into account that the treatment will also be dependent on availability of resources and existing treatment guidelines, considered by other Pain Management Practitioners as being common knowledge and practice, at the time of the intervention. For Medico-Legal purposes, it is also important to point out that variation in procedural techniques and pharmacological choices are the acceptable norm. The indications, contraindications, technique, and results of the above procedure should only be interpreted and judged by a Board-Certified Interventional Pain Specialist with extensive familiarity and expertise in the same exact procedure and technique.

## 2022-05-23 NOTE — Addendum Note (Signed)
Addended by: Hart Rochester on: 05/23/2022 03:29 PM   Modules accepted: Orders

## 2022-06-12 DIAGNOSIS — Z124 Encounter for screening for malignant neoplasm of cervix: Secondary | ICD-10-CM | POA: Diagnosis not present

## 2022-06-12 DIAGNOSIS — Z01419 Encounter for gynecological examination (general) (routine) without abnormal findings: Secondary | ICD-10-CM | POA: Diagnosis not present

## 2022-06-12 DIAGNOSIS — Z1151 Encounter for screening for human papillomavirus (HPV): Secondary | ICD-10-CM | POA: Diagnosis not present

## 2022-06-13 ENCOUNTER — Encounter: Payer: Self-pay | Admitting: Student in an Organized Health Care Education/Training Program

## 2022-06-13 ENCOUNTER — Ambulatory Visit
Payer: Medicare HMO | Attending: Student in an Organized Health Care Education/Training Program | Admitting: Student in an Organized Health Care Education/Training Program

## 2022-06-13 VITALS — BP 128/86 | HR 80 | Temp 97.2°F | Resp 16 | Ht <= 58 in | Wt 144.0 lb

## 2022-06-13 DIAGNOSIS — M542 Cervicalgia: Secondary | ICD-10-CM

## 2022-06-13 DIAGNOSIS — M7918 Myalgia, other site: Secondary | ICD-10-CM | POA: Insufficient documentation

## 2022-06-13 MED ORDER — ROPIVACAINE HCL 2 MG/ML IJ SOLN
9.0000 mL | Freq: Once | INTRAMUSCULAR | Status: AC
  Administered 2022-06-13: 9 mL via PERINEURAL
  Filled 2022-06-13: qty 20

## 2022-06-13 MED ORDER — DIAZEPAM 5 MG PO TABS
ORAL_TABLET | ORAL | Status: AC
  Filled 2022-06-13: qty 1

## 2022-06-13 MED ORDER — DIAZEPAM 5 MG PO TABS
5.0000 mg | ORAL_TABLET | ORAL | Status: AC
  Administered 2022-06-13: 5 mg via ORAL

## 2022-06-13 NOTE — Addendum Note (Signed)
Addended by: Leotis Shames F on: 06/13/2022 02:54 PM   Modules accepted: Orders

## 2022-06-13 NOTE — Progress Notes (Signed)
PROVIDER NOTE: Information contained herein reflects review and annotations entered in association with encounter. Interpretation of such information and data should be left to medically-trained personnel. Information provided to patient can be located elsewhere in the medical record under "Patient Instructions". Document created using STT-dictation technology, any transcriptional errors that may result from process are unintentional.    Patient: Brianna Arnold  Service Category: Procedure  Provider: Gillis Santa, MD  DOB: 1990/12/15  DOS: 06/13/2022  Location: [facility]  MRN: [id number]  Setting: Ambulatory - outpatient  Referring Provider: Idelle Crouch, MD  Type: Established Patient  Specialty: Interventional Pain Management  PCP: Idelle Crouch, MD   Primary Reason for Visit: Interventional Pain Management Treatment. CC: Neck Pain   Procedure:          Anesthesia, Analgesia, Anxiolysis:  Type: Lumbar Paraspinal Muscle Trigger Point Injection (3+)         Quadratus lumborum, erector spinae, multifidus and periscapular region and bilateral cervical/trapezius TPI CPT: 20553 Primary Purpose: Therapeutic Approach: Percutaneous, ipsilateral approach. Laterality: Midline        Type: Local Anesthesia + PO Valium 5 mg   Position: Prone   Indications: 1. Cervicalgia   2. Myofascial pain syndrome of lumbar spine        Pain Score: Pre-procedure: 7 /10 Post-procedure: 5 /10   Pre-op H&P Assessment:  Brianna Arnold is a 32 y.o. (year old), female patient, seen today for interventional treatment. She  has a past surgical history that includes Cervical spine surgery (07/2014); Back surgery (10/2014); and Anterior cervical decomp/discectomy fusion (N/A, 02/11/2019). Brianna Arnold has a current medication list which includes the following prescription(s): albuterol, baclofen, buprenorphine, vitamin d3, cyclobenzaprine, dicyclomine, ergocalciferol, famotidine, ferrous  sulfate, gabapentin, medroxyprogesterone, meloxicam, methocarbamol, ofloxacin, promethazine, propranolol er, tramadol, venlafaxine xr, vitamin b-12, and zolpidem. Her primarily concern today is the Neck Pain   Initial Vital Signs:  Pulse/HCG Rate: 80ECG Heart Rate: 93 Temp: (!) 97.2 F (36.2 C) Resp: 16 BP: (!) 126/94 SpO2: 99 %  BMI: Estimated body mass index is 32.87 kg/m as calculated from the following:   Height as of this encounter: 4' 7.5" (1.41 m).   Weight as of this encounter: 144 lb (65.3 kg).  Risk Assessment: Allergies: Reviewed. She is allergic to hypafix [wound dressings], ativan [lorazepam], and tizanidine.  Allergy Precautions: None required Coagulopathies: Reviewed. None identified.  Blood-thinner therapy: None at this time Active Infection(s): Reviewed. None identified. [name] is afebrile  Site Confirmation: Brianna Arnold was asked to confirm the procedure and laterality before marking the site Procedure checklist: Completed Consent: Before the procedure and under the influence of no sedative(s), amnesic(s), or anxiolytics, the patient was informed of the treatment options, risks and possible complications. To fulfill our ethical and legal obligations, as recommended by the American Medical Association's Code of Ethics, I have informed the patient of my clinical impression; the nature and purpose of the treatment or procedure; the risks, benefits, and possible complications of the intervention; the alternatives, including doing nothing; the risk(s) and benefit(s) of the alternative treatment(s) or procedure(s); and the risk(s) and benefit(s) of doing nothing. The patient was provided information about the general risks and possible complications associated with the procedure. These may include, but are not limited to: failure to achieve desired goals, infection, bleeding, organ or nerve damage, allergic reactions, paralysis, and death. In addition, the patient was  informed of those risks and complications associated to the procedure, such as failure to decrease pain; infection;  bleeding; organ or nerve damage with subsequent damage to sensory, motor, and/or autonomic systems, resulting in permanent pain, numbness, and/or weakness of one or several areas of the body; allergic reactions; (i.e.: anaphylactic reaction); and/or death. Furthermore, the patient was informed of those risks and complications associated with the medications. These include, but are not limited to: allergic reactions (i.e.: anaphylactic or anaphylactoid reaction(s)); adrenal axis suppression; blood sugar elevation that in diabetics may result in ketoacidosis or comma; water retention that in patients with history of congestive heart failure may result in shortness of breath, pulmonary edema, and decompensation with resultant heart failure; weight gain; swelling or edema; medication-induced neural toxicity; particulate matter embolism and blood vessel occlusion with resultant organ, and/or nervous system infarction; and/or aseptic necrosis of one or more joints. Finally, the patient was informed that Medicine is not an exact science; therefore, there is also the possibility of unforeseen or unpredictable risks and/or possible complications that may result in a catastrophic outcome. The patient indicated having understood very clearly. We have given the patient no guarantees and we have made no promises. Enough time was given to the patient to ask questions, all of which were answered to the patient's satisfaction. Brianna Arnold has indicated that she wanted to continue with the procedure. Attestation: I, the ordering provider, attest that I have discussed with the patient the benefits, risks, side-effects, alternatives, likelihood of achieving goals, and potential problems during recovery for the procedure that I have provided informed consent. Date  Time: 06/13/2022  1:58 PM  Pre-Procedure  Preparation:  Monitoring: As per clinic protocol. Respiration, ETCO2, SpO2, BP, heart rate and rhythm monitor placed and checked for adequate function Safety Precautions: Patient was assessed for positional comfort and pressure points before starting the procedure. Time-out: I initiated and conducted the "Time-out" before starting the procedure, as per protocol. The patient was asked to participate by confirming the accuracy of the "Time Out" information. Verification of the correct person, site, and procedure were performed and confirmed by me, the nursing staff, and the patient. "Time-out" conducted as per Joint Commission's Universal Protocol ([email or web address].01.01.01). Time: 1417  Description of Procedure:          Area Prepped: Entire Lower Lumbosacral Region & Right cervical region DuraPrep (Iodine Povacrylex [0.7% available iodine] and Isopropyl Alcohol, 74% w/w) Safety Precautions: Aspiration looking for blood return was conducted prior to all injections. At no point did we inject any substances, as a needle was being advanced. No attempts were made at seeking any paresthesias. Safe injection practices and needle disposal techniques used. Medications properly checked for expiration dates. SDV (single dose vial) medications used. Description of the Procedure: Protocol guidelines were followed. The patient was placed in position over the fluoroscopy table. The target area was identified and the area prepped in the usual manner. Skin & deeper tissues infiltrated with local anesthetic. Appropriate amount of time allowed to pass for local anesthetics to take effect. The procedure needles were then advanced to the target area. Proper needle placement secured. Negative aspiration confirmed. Solution injected in intermittent fashion, asking for systemic symptoms every 0.5cc of injectate. The needles were then removed and the area cleansed, making sure to leave some of the prepping solution back to take advantage of its  long term bactericidal properties.  Vitals:   06/13/22 1359 06/13/22 1417 06/13/22 1422 06/13/22 1430  BP: (!) 126/94 (!) 132/98 (!) 145/108 128/86  Pulse: 80     Resp: 16 18 16 16  $ Temp: (!) 97.2 F (36.2  C)     SpO2: 99% 100% 98%   Weight: 144 lb (65.3 kg)     Height: 4' 7.5" (1.41 m)          Start Time: 1417 hrs. End Time: 1426 hrs. Materials:  Needle(s) Type: Regular needle Gauge: 27G Length: 1.5-in  Medication(s): Please see orders for medications and dosing details.   17 trigger points were also injected in the right and left cervical, right and left trapezius, & lumbar paraspinal region with dry needling performed.  Each trigger point was injected with 0.5 to 1 cc of 0.2% ropivacaine.   Post-operative Assessment:  Post-procedure Vital Signs:  Pulse/HCG Rate: 8091 Temp: (!) 97.2 F (36.2 C) Resp: 16 BP: 128/86 SpO2: 98 %  EBL: None  Complications: No immediate post-treatment complications observed by team, or reported by patient.  Note: The patient tolerated the entire procedure well. A repeat set of vitals were taken after the procedure and the patient was kept under observation following institutional policy, for this type of procedure. Post-procedural neurological assessment was performed, showing return to baseline, prior to discharge. The patient was provided with post-procedure discharge instructions, including a section on how to identify potential problems. Should any problems arise concerning this procedure, the patient was given instructions to immediately contact us, at any time, without hesitation. In any case, we plan to contact the patient by telephone for a follow-up status report regarding this interventional procedure.  Comments:  No additional relevant information.  Plan of Care   Medications ordered for procedure: Meds ordered this encounter  Medications   ropivacaine (PF) 2 mg/mL (0.2%) (NAROPIN) injection 9 mL   ropivacaine (PF) 2 mg/mL  (0.2%) (NAROPIN) injection 9 mL    No orders of the defined types were placed in this encounter.    Medications administered: We administered ropivacaine (PF) 2 mg/mL (0.2%) and ropivacaine (PF) 2 mg/mL (0.2%).  See the medical record for exact dosing, route, and time of administration.  Follow-up plan:   No follow-ups on file.       Pharmacological management options:  Opioid Analgesics: Butrans patch, side effects at 7.5 mcg an hour, analgesic benefit with no side effects at 5 mcg an hour, continue  Membrane stabilizer: Continue gabapentin as prescribed  Muscle relaxant: Continue Flexeril as prescribed  NSAID: To be determined at a later time  Other analgesic(s): To be determined at a later time    Interventional management options: Ms. Prum was informed that there is no guarantee that she would be a candidate for interventional therapies. The decision will be based on the results of diagnostic studies, as well as Ms. Klas's risk profile.  Procedure(s) hx Left axillary peripheral nerve stimulation: 12/30/19 providing significant pain relief.  Removed 02/29/2020              Recent Visits Date Type Provider Dept  05/23/22 Procedure visit Gillis Santa, MD [facility]  05/08/22 Office Visit Gillis Santa, MD [facility]  04/25/22 Procedure visit Gillis Santa, MD [facility]  Showing recent visits within past 90 days and meeting all other requirements Today's Visits Date Type Provider Dept  06/13/22 Procedure visit Gillis Santa, MD [facility]  Showing today's visits and meeting all other requirements Future Appointments Date Type Provider Dept  08/28/22 Appointment Gillis Santa, MD [facility]  Showing future appointments within next 90 days and meeting all other requirements  Disposition: Discharge home  Discharge (Date  Time): 06/13/2022; 1436 hrs.  Primary Care Physician: Idelle Crouch, MD Location: [facility] [place]  Outpatient Pain Management Facility Note by: Gillis Santa, MD Date: 06/13/2022; Time: 2:33 PM  Disclaimer:  Medicine is not an exact science. The only guarantee in medicine is that nothing is guaranteed. It is important to note that the decision to proceed with this intervention was based on the information collected from the patient. The Data and conclusions were drawn from the patient's questionnaire, the interview, and the physical examination. Because the information was provided in large part by the patient, it cannot be guaranteed that it has not been purposely or unconsciously manipulated. Every effort has been made to obtain as much relevant data as possible for this evaluation. It is important to note that the conclusions that lead to this procedure are derived in large part from the available data. Always take into account that the treatment will also be dependent on availability of resources and existing treatment guidelines, considered by other Pain Management Practitioners as being common knowledge and practice, at the time of the intervention. For Medico-Legal purposes, it is also important to point out that variation in procedural techniques and pharmacological choices are the acceptable norm. The indications, contraindications, technique, and results of the above procedure should only be interpreted and judged by a Board-Certified Interventional Pain Specialist with extensive familiarity and expertise in the same exact procedure and technique.

## 2022-06-13 NOTE — Patient Instructions (Signed)
Pain Management Discharge Instructions  General Discharge Instructions :  If you need to reach your doctor call: Monday-Friday 8:00 am - 4:00 pm at [phone] or toll free [phone].  After clinic hours [phone] to have operator reach doctor.  Bring all of your medication bottles to all your appointments in the pain clinic.  To cancel or reschedule your appointment with Pain Management please remember to call 24 hours in advance to avoid a fee.  Refer to the educational materials which you have been given on: General Risks, I had my Procedure. Discharge Instructions, Post Sedation.  Post Procedure Instructions:  The drugs you were given will stay in your system until tomorrow, so for the next 24 hours you should not drive, make any legal decisions or drink any alcoholic beverages.  You may eat anything you prefer, but it is better to start with liquids then soups and crackers, and gradually work up to solid foods.  Please notify your doctor immediately if you have any unusual bleeding, trouble breathing or pain that is not related to your normal pain.  Depending on the type of procedure that was done, some parts of your body may feel week and/or numb.  This usually clears up by tonight or the next day.  Walk with the use of an assistive device or accompanied by an adult for the 24 hours.  You may use ice on the affected area for the first 24 hours.  Put ice in a Ziploc bag and cover with a towel and place against area 15 minutes on 15 minutes off.  You may switch to heat after 24 hours.Trigger Point Injection Trigger points are areas where you have pain. A trigger point injection is a shot given in the trigger point to help relieve pain for a few days to a few months. Common places for trigger points include the neck, shoulders, upper back, or lower back. A trigger point injection will not cure long-term (chronic) pain permanently. These injections do not always work for every  person. For some people, they can help to relieve pain for a few days to a few months. Tell a health care provider about: Any allergies you have. All medicines you are taking, including vitamins, herbs, eye drops, creams, and over-the-counter medicines. Any problems you or family members have had with anesthetic medicines. Any bleeding problems you have. Any surgeries you have had. Any medical conditions you have. Whether you are pregnant or may be pregnant. What are the risks? Generally, this is a safe procedure. However, problems may occur, including: Infection. Bleeding or bruising. Allergic reaction to the injected medicine. Irritation of the skin around the injection site. What happens before the procedure? Ask your health care provider about: Changing or stopping your regular medicines. This is especially important if you are taking diabetes medicines or blood thinners. Taking medicines such as aspirin and ibuprofen. These medicines can thin your blood. Do not take these medicines unless your health care provider tells you to take them. Taking over-the-counter medicines, vitamins, herbs, and supplements. What happens during the procedure?  Your health care provider will feel for trigger points. A marker may be used to circle the area for the injection. The skin over the trigger point will be washed with a germ-killing soap. You may be given a medicine to help you relax (sedative). A thin needle is used for the injection. You may feel pain or a twitching feeling when the needle enters your skin. A numbing solution may be injected  into the trigger point. Sometimes a medicine to keep down inflammation is also injected. Your health care provider may move the needle around the area where the trigger point is located until the tightness and twitching goes away. After the injection, your health care provider may put gentle pressure over the injection site. The injection site will be  covered with a bandage (dressing). The procedure may vary among health care providers and hospitals. What can I expect after treatment? After treatment, you may have soreness and stiffness for 1-2 days. Follow these instructions at home: Injection site care Remove your dressing in a few hours, or as told by your health care provider. Check your injection site every day for signs of infection. Check for: Redness, swelling, or pain. Fluid or blood. Warmth. Pus or a bad smell. Managing pain, stiffness, and swelling If directed, put ice on the affected area. To do this: Put ice in a plastic bag. Place a towel between your skin and the bag. Leave the ice on for 20 minutes, 2-3 times a day. Remove the ice if your skin turns bright red. This is very important. If you cannot feel pain, heat, or cold, you have a greater risk of damage to the area. Activity If you were given a sedative during the procedure, it can affect you for several hours. Do not drive or operate machinery until your health care provider says that it is safe. Do not take baths, swim, or use a hot tub until your health care provider approves. Return to your normal activities as told by your health care provider. Ask your health care provider what activities are safe for you. General instructions If you were asked to stop your regular medicines, ask your health care provider when you may start taking them again. You may be asked to see an occupational or physical therapist for exercises that reduce muscle strain and stretch the area of the trigger point. Keep all follow-up visits. This is important. Contact a health care provider if: Your pain comes back, and it is worse than before the injection. You may need more injections. You have chills or a fever. The injection site becomes more painful, red, swollen, or warm to the touch. Summary A trigger point injection is a shot given in the trigger point to help relieve  pain. Common places for trigger point injections are the neck, shoulders, upper back, and lower back. These injections do not always work for every person, but for some people, the injections can help to relieve pain for a few days to a few months. Contact a health care provider if symptoms come back or if they are worse than before treatment. Also, get help if the injection site becomes more painful, red, swollen, or warm to the touch. This information is not intended to replace advice given to you by your health care provider. Make sure you discuss any questions you have with your health care provider. Document Revised: 07/19/2020 Document Reviewed: 07/19/2020 Elsevier Patient Education  [place].

## 2022-06-14 ENCOUNTER — Telehealth: Payer: Self-pay | Admitting: *Deleted

## 2022-06-14 NOTE — Telephone Encounter (Signed)
Attempted to call for post procedure follow-up. Message left. 

## 2022-06-25 ENCOUNTER — Telehealth: Payer: Self-pay | Admitting: Internal Medicine

## 2022-06-25 ENCOUNTER — Other Ambulatory Visit: Payer: Self-pay

## 2022-06-25 DIAGNOSIS — G8929 Other chronic pain: Secondary | ICD-10-CM

## 2022-06-25 DIAGNOSIS — M25552 Pain in left hip: Secondary | ICD-10-CM

## 2022-06-25 DIAGNOSIS — M25551 Pain in right hip: Secondary | ICD-10-CM

## 2022-06-25 DIAGNOSIS — M1611 Unilateral primary osteoarthritis, right hip: Secondary | ICD-10-CM

## 2022-06-25 NOTE — Telephone Encounter (Signed)
This was in ref to the message I sent you earlier

## 2022-06-25 NOTE — Telephone Encounter (Signed)
Copied from [place] [phone]. Topic: Referral - Request for Referral >> Jun 25, 2022  9:52 AM Tiffany B wrote: Patient was seen by Dr. Zigmund Daniel on 03/03/2023 - PT.   [facility] requesting a new referral. If patient has not been seen in 1 month as per specialist a new referral is needed. Please  fax [phone]

## 2022-07-04 NOTE — Telephone Encounter (Signed)
Can you re fax this please?

## 2022-07-04 NOTE — Telephone Encounter (Signed)
Brianna Arnold from [facility] need the referral for therapy resent as she did not receive the one faxed on 06/25/22. [facility] Fax# [phone] ... Brianna Arnold stated patient has an appointment on 3/18 and can not be seen without the referral.  Please assist further

## 2022-07-09 DIAGNOSIS — M25552 Pain in left hip: Secondary | ICD-10-CM | POA: Diagnosis not present

## 2022-07-09 DIAGNOSIS — M25551 Pain in right hip: Secondary | ICD-10-CM | POA: Diagnosis not present

## 2022-07-11 ENCOUNTER — Encounter: Payer: Self-pay | Admitting: Student in an Organized Health Care Education/Training Program

## 2022-07-11 ENCOUNTER — Ambulatory Visit
Payer: Medicare HMO | Attending: Student in an Organized Health Care Education/Training Program | Admitting: Student in an Organized Health Care Education/Training Program

## 2022-07-11 VITALS — BP 133/97 | HR 72 | Temp 97.2°F | Resp 16 | Ht <= 58 in | Wt 147.0 lb

## 2022-07-11 DIAGNOSIS — M7918 Myalgia, other site: Secondary | ICD-10-CM

## 2022-07-11 DIAGNOSIS — Z981 Arthrodesis status: Secondary | ICD-10-CM | POA: Insufficient documentation

## 2022-07-11 DIAGNOSIS — M4105 Infantile idiopathic scoliosis, thoracolumbar region: Secondary | ICD-10-CM | POA: Diagnosis not present

## 2022-07-11 MED ORDER — ROPIVACAINE HCL 2 MG/ML IJ SOLN
9.0000 mL | Freq: Once | INTRAMUSCULAR | Status: AC
  Administered 2022-07-11: 9 mL via PERINEURAL

## 2022-07-11 MED ORDER — DIAZEPAM 5 MG PO TABS
ORAL_TABLET | ORAL | Status: AC
  Filled 2022-07-11: qty 1

## 2022-07-11 MED ORDER — ROPIVACAINE HCL 2 MG/ML IJ SOLN
INTRAMUSCULAR | Status: AC
  Filled 2022-07-11: qty 20

## 2022-07-11 MED ORDER — DIAZEPAM 5 MG PO TABS
5.0000 mg | ORAL_TABLET | ORAL | Status: AC
  Administered 2022-07-11: 5 mg via ORAL

## 2022-07-11 NOTE — Progress Notes (Signed)
PROVIDER NOTE: Information contained herein reflects review and annotations entered in association with encounter. Interpretation of such information and data should be left to medically-trained personnel. Information provided to patient can be located elsewhere in the medical record under "Patient Instructions". Document created using STT-dictation technology, any transcriptional errors that may result from process are unintentional.    Patient: Brianna Arnold  Service Category: Procedure  Provider: Gillis Santa, MD  DOB: [birth year]  DOS: 07/11/2022  Location: [facility]  MRN: [id number]  Setting: Ambulatory - outpatient  Referring Provider: Idelle Crouch, MD  Type: Established Patient  Specialty: Interventional Pain Management  PCP: Idelle Crouch, MD   Primary Reason for Visit: Interventional Pain Management Treatment. CC: Neck Pain (Right shoulder left neck)   Procedure:          Anesthesia, Analgesia, Anxiolysis:  Type: Lumbar Paraspinal Muscle Trigger Point Injection (3+)         Quadratus lumborum, erector spinae, multifidus and periscapular region and bilateral cervical/trapezius TPI CPT: 20553 Primary Purpose: Therapeutic Approach: Percutaneous, ipsilateral approach. Laterality: Midline        Type: Local Anesthesia + PO Valium 5 mg   Position: Prone   Indications: 1. Myofascial pain syndrome of lumbar spine   2. Infantile idiopathic scoliosis of thoracolumbar region   3. History of lumbar spinal fusion (T11- iliac)        Pain Score: Pre-procedure: 5 /10 Post-procedure: 4 /10   Pre-op H&P Assessment:  Brianna Arnold is a 32 y.o. (year old), female patient, seen today for interventional treatment. She  has a past surgical history that includes Cervical spine surgery (07/2014); Back surgery (10/2014); and Anterior cervical decomp/discectomy fusion (N/A, 02/11/2019). Brianna Arnold has a current medication list which includes the following  prescription(s): albuterol, baclofen, buprenorphine, vitamin d3, cyclobenzaprine, dicyclomine, ergocalciferol, famotidine, ferrous sulfate, gabapentin, medroxyprogesterone, meloxicam, methocarbamol, ofloxacin, promethazine, propranolol er, tramadol, venlafaxine xr, vitamin b-12, and zolpidem. Her primarily concern today is the Neck Pain (Right shoulder left neck)   Initial Vital Signs:  Pulse/HCG Rate: 72  Temp: (!) 97.2 F (36.2 C) Resp: 16 BP: (!) 138/100 SpO2: 99 %  BMI: Estimated body mass index is 34.17 kg/m as calculated from the following:   Height as of this encounter: 4\' 7"  (1.397 m).   Weight as of this encounter: 147 lb (66.7 kg).  Risk Assessment: Allergies: Reviewed. She is allergic to hypafix [wound dressings], ativan [lorazepam], and tizanidine.  Allergy Precautions: None required Coagulopathies: Reviewed. None identified.  Blood-thinner therapy: None at this time Active Infection(s): Reviewed. None identified. [name] is afebrile  Site Confirmation: Brianna Arnold was asked to confirm the procedure and laterality before marking the site Procedure checklist: Completed Consent: Before the procedure and under the influence of no sedative(s), amnesic(s), or anxiolytics, the patient was informed of the treatment options, risks and possible complications. To fulfill our ethical and legal obligations, as recommended by the American Medical Association's Code of Ethics, I have informed the patient of my clinical impression; the nature and purpose of the treatment or procedure; the risks, benefits, and possible complications of the intervention; the alternatives, including doing nothing; the risk(s) and benefit(s) of the alternative treatment(s) or procedure(s); and the risk(s) and benefit(s) of doing nothing. The patient was provided information about the general risks and possible complications associated with the procedure. These may include, but are not limited to: failure to  achieve desired goals, infection, bleeding, organ or nerve damage, allergic reactions, paralysis, and death. In  addition, the patient was informed of those risks and complications associated to the procedure, such as failure to decrease pain; infection; bleeding; organ or nerve damage with subsequent damage to sensory, motor, and/or autonomic systems, resulting in permanent pain, numbness, and/or weakness of one or several areas of the body; allergic reactions; (i.e.: anaphylactic reaction); and/or death. Furthermore, the patient was informed of those risks and complications associated with the medications. These include, but are not limited to: allergic reactions (i.e.: anaphylactic or anaphylactoid reaction(s)); adrenal axis suppression; blood sugar elevation that in diabetics may result in ketoacidosis or comma; water retention that in patients with history of congestive heart failure may result in shortness of breath, pulmonary edema, and decompensation with resultant heart failure; weight gain; swelling or edema; medication-induced neural toxicity; particulate matter embolism and blood vessel occlusion with resultant organ, and/or nervous system infarction; and/or aseptic necrosis of one or more joints. Finally, the patient was informed that Medicine is not an exact science; therefore, there is also the possibility of unforeseen or unpredictable risks and/or possible complications that may result in a catastrophic outcome. The patient indicated having understood very clearly. We have given the patient no guarantees and we have made no promises. Enough time was given to the patient to ask questions, all of which were answered to the patient's satisfaction. Brianna Arnold has indicated that she wanted to continue with the procedure. Attestation: I, the ordering provider, attest that I have discussed with the patient the benefits, risks, side-effects, alternatives, likelihood of achieving goals, and potential  problems during recovery for the procedure that I have provided informed consent. Date  Time: 07/11/2022 10:41 AM  Pre-Procedure Preparation:  Monitoring: As per clinic protocol. Respiration, ETCO2, SpO2, BP, heart rate and rhythm monitor placed and checked for adequate function Safety Precautions: Patient was assessed for positional comfort and pressure points before starting the procedure. Time-out: I initiated and conducted the "Time-out" before starting the procedure, as per protocol. The patient was asked to participate by confirming the accuracy of the "Time Out" information. Verification of the correct person, site, and procedure were performed and confirmed by me, the nursing staff, and the patient. "Time-out" conducted as per Joint Commission's Universal Protocol (UP.01.01.01). Time: 1118  Description of Procedure:          Area Prepped: Entire Lower Lumbosacral Region & Right cervical region DuraPrep (Iodine Povacrylex [0.7% available iodine] and Isopropyl Alcohol, 74% w/w) Safety Precautions: Aspiration looking for blood return was conducted prior to all injections. At no point did we inject any substances, as a needle was being advanced. No attempts were made at seeking any paresthesias. Safe injection practices and needle disposal techniques used. Medications properly checked for expiration dates. SDV (single dose vial) medications used. Description of the Procedure: Protocol guidelines were followed. The patient was placed in position over the fluoroscopy table. The target area was identified and the area prepped in the usual manner. Skin & deeper tissues infiltrated with local anesthetic. Appropriate amount of time allowed to pass for local anesthetics to take effect. The procedure needles were then advanced to the target area. Proper needle placement secured. Negative aspiration confirmed. Solution injected in intermittent fashion, asking for systemic symptoms every 0.5cc of injectate.  The needles were then removed and the area cleansed, making sure to leave some of the prepping solution back to take advantage of its long term bactericidal properties.  Vitals:   07/11/22 1051 07/11/22 1100  BP: (!) 138/100 (!) 133/97  Pulse: 72  Resp: 16 16  Temp: (!) 97.2 F (36.2 C)   SpO2: 99%   Weight: 147 lb (66.7 kg)   Height: 4\' 7"  (1.397 m)        Start Time: 1118 hrs. End Time: 1130 hrs. Materials:  Needle(s) Type: Regular needle Gauge: 27G Length: 1.5-in  Medication(s): Please see orders for medications and dosing details.   17 trigger points were also injected in the right and left cervical, right and left trapezius, & lumbar paraspinal region with dry needling performed.  Each trigger point was injected with 0.5 to 1 cc of 0.2% ropivacaine.   Post-operative Assessment:  Post-procedure Vital Signs:  Pulse/HCG Rate: 72  Temp: (!) 97.2 F (36.2 C) Resp: 16 BP: (!) 133/97 SpO2: 99 %  EBL: None  Complications: No immediate post-treatment complications observed by team, or reported by patient.  Note: The patient tolerated the entire procedure well. A repeat set of vitals were taken after the procedure and the patient was kept under observation following institutional policy, for this type of procedure. Post-procedural neurological assessment was performed, showing return to baseline, prior to discharge. The patient was provided with post-procedure discharge instructions, including a section on how to identify potential problems. Should any problems arise concerning this procedure, the patient was given instructions to immediately contact us, at any time, without hesitation. In any case, we plan to contact the patient by telephone for a follow-up status report regarding this interventional procedure.  Comments:  No additional relevant information.  Plan of Care   Medications ordered for procedure: Meds ordered this encounter  Medications   ropivacaine (PF) 2  mg/mL (0.2%) (NAROPIN) injection 9 mL   ropivacaine (PF) 2 mg/mL (0.2%) (NAROPIN) injection 9 mL    No orders of the defined types were placed in this encounter.    Medications administered: We administered ropivacaine (PF) 2 mg/mL (0.2%) and ropivacaine (PF) 2 mg/mL (0.2%).  See the medical record for exact dosing, route, and time of administration.  Follow-up plan:   Return in about 4 weeks (around 08/08/2022), or if symptoms worsen or fail to improve/ TPI.       Pharmacological management options:  Opioid Analgesics: Butrans patch, side effects at 7.5 mcg an hour, analgesic benefit with no side effects at 5 mcg an hour, continue  Membrane stabilizer: Continue gabapentin as prescribed  Muscle relaxant: Continue Flexeril as prescribed  NSAID: To be determined at a later time  Other analgesic(s): To be determined at a later time    Interventional management options: Ms. States was informed that there is no guarantee that she would be a candidate for interventional therapies. The decision will be based on the results of diagnostic studies, as well as Ms. Panozzo's risk profile.  Procedure(s) hx Left axillary peripheral nerve stimulation: 12/30/19 providing significant pain relief.  Removed 02/29/2020              Recent Visits Date Type Provider Dept  06/13/22 Procedure visit Gillis Santa, MD [facility]  05/23/22 Procedure visit Gillis Santa, MD [facility]  05/08/22 Office Visit Gillis Santa, MD [facility]  04/25/22 Procedure visit Gillis Santa, MD [facility]  Showing recent visits within past 90 days and meeting all other requirements Today's Visits Date Type Provider Dept  07/11/22 Procedure visit Gillis Santa, MD [facility]  Showing today's visits and meeting all other requirements Future Appointments Date Type Provider Dept  08/08/22 Appointment Gillis Santa, [facility]  08/28/22  Appointment Gillis Santa, MD [facility]  Showing future appointments within next 90 days and meeting all other requirements  Disposition: Discharge home  Discharge (Date  Time): 07/11/2022; 1133 hrs.   Primary Care Physician: Idelle Crouch, MD Location: [facility] Note by: Gillis Santa, MD Date: 07/11/2022; Time: 12:35 PM  Disclaimer:  Medicine is not an exact science. The only guarantee in medicine is that nothing is guaranteed. It is important to note that the decision to proceed with this intervention was based on the information collected from the patient. The Data and conclusions were drawn from the patient's questionnaire, the interview, and the physical examination. Because the information was provided in large part by the patient, it cannot be guaranteed that it has not been purposely or unconsciously manipulated. Every effort has been made to obtain as much relevant data as possible for this evaluation. It is important to note that the conclusions that lead to this procedure are derived in large part from the available data. Always take into account that the treatment will also be dependent on availability of resources and existing treatment guidelines, considered by other Pain Management Practitioners as being common knowledge and practice, at the time of the intervention. For Medico-Legal purposes, it is also important to point out that variation in procedural techniques and pharmacological choices are the acceptable norm. The indications, contraindications, technique, and results of the above procedure should only be interpreted and judged by a Board-Certified Interventional Pain Specialist with extensive familiarity and expertise in the same exact procedure and technique.

## 2022-07-11 NOTE — Patient Instructions (Signed)

## 2022-07-11 NOTE — Progress Notes (Signed)
Safety precautions to be maintained throughout the outpatient stay will include: orient to surroundings, keep bed in low position, maintain call bell within reach at all times, provide assistance with transfer out of bed and ambulation.  

## 2022-07-11 NOTE — Addendum Note (Signed)
Addended by: Leotis Shames F on: 07/11/2022 01:14 PM   Modules accepted: Orders

## 2022-07-11 NOTE — Addendum Note (Signed)
Addended by: Gillis Santa on: 07/11/2022 01:03 PM   Modules accepted: Orders

## 2022-07-12 ENCOUNTER — Telehealth: Payer: Self-pay | Admitting: *Deleted

## 2022-07-12 NOTE — Telephone Encounter (Signed)
Attempted to call for post procedure follow-up. Message left. 

## 2022-07-16 DIAGNOSIS — M25552 Pain in left hip: Secondary | ICD-10-CM | POA: Diagnosis not present

## 2022-07-16 DIAGNOSIS — M25551 Pain in right hip: Secondary | ICD-10-CM | POA: Diagnosis not present

## 2022-07-19 DIAGNOSIS — Z3042 Encounter for surveillance of injectable contraceptive: Secondary | ICD-10-CM | POA: Diagnosis not present

## 2022-08-01 DIAGNOSIS — M25551 Pain in right hip: Secondary | ICD-10-CM | POA: Diagnosis not present

## 2022-08-01 DIAGNOSIS — M25552 Pain in left hip: Secondary | ICD-10-CM | POA: Diagnosis not present

## 2022-08-08 ENCOUNTER — Encounter: Payer: Self-pay | Admitting: Student in an Organized Health Care Education/Training Program

## 2022-08-08 ENCOUNTER — Ambulatory Visit
Payer: Medicare HMO | Attending: Student in an Organized Health Care Education/Training Program | Admitting: Student in an Organized Health Care Education/Training Program

## 2022-08-08 VITALS — BP 144/106 | HR 85 | Temp 97.7°F | Resp 15 | Ht <= 58 in | Wt 140.0 lb

## 2022-08-08 DIAGNOSIS — M7918 Myalgia, other site: Secondary | ICD-10-CM

## 2022-08-08 MED ORDER — ROPIVACAINE HCL 2 MG/ML IJ SOLN
9.0000 mL | Freq: Once | INTRAMUSCULAR | Status: AC
  Administered 2022-08-08: 9 mL via PERINEURAL

## 2022-08-08 MED ORDER — ROPIVACAINE HCL 2 MG/ML IJ SOLN
INTRAMUSCULAR | Status: AC
  Filled 2022-08-08: qty 20

## 2022-08-08 NOTE — Progress Notes (Signed)
Safety precautions to be maintained throughout the outpatient stay will include: orient to surroundings, keep bed in low position, maintain call bell within reach at all times, provide assistance with transfer out of bed and ambulation.  

## 2022-08-08 NOTE — Progress Notes (Signed)
PROVIDER NOTE: Information contained herein reflects review and annotations entered in association with encounter. Interpretation of such information and data should be left to medically-trained personnel. Information provided to patient can be located elsewhere in the medical record under "Patient Instructions". Document created using STT-dictation technology, any transcriptional errors that may result from process are unintentional.    Patient: Brianna Arnold  Service Category: Procedure  Provider: Edward Jolly, MD  DOB: October 01, 1990  DOS: 08/08/2022  Location: [facility]  MRN: [id number]  Setting: Ambulatory - outpatient  Referring Provider: Marguarite Arbour, MD  Type: Established Patient  Specialty: Interventional Pain Management  PCP: Marguarite Arbour, MD   Primary Reason for Visit: Interventional Pain Management Treatment. CC: Neck Pain   Procedure:          Anesthesia, Analgesia, Anxiolysis:  Type: Lumbar Paraspinal Muscle Trigger Point Injection (3+)         Quadratus lumborum, erector spinae, multifidus and periscapular region and bilateral cervical/trapezius TPI CPT: 20553 Primary Purpose: Therapeutic Approach: Percutaneous, ipsilateral approach. Laterality: Midline        Type: Local Anesthesia   Position: Prone   Indications: 1. Myofascial pain syndrome of lumbar spine     Pain Score: Pre-procedure: 7 /10 Post-procedure: 7 /10   Pre-op H&P Assessment:  Brianna Arnold is a 32 y.o. (year old), female patient, seen today for interventional treatment. She  has a past surgical history that includes Cervical spine surgery (07/2014); Back surgery (10/2014); and Anterior cervical decomp/discectomy fusion (N/A, 02/11/2019). Brianna Arnold has a current medication list which includes the following prescription(s): albuterol, baclofen, buprenorphine, vitamin d3, cyclobenzaprine, dicyclomine, ergocalciferol, famotidine, ferrous sulfate, gabapentin, medroxyprogesterone,  meloxicam, methocarbamol, ofloxacin, promethazine, propranolol er, tramadol, venlafaxine xr, vitamin b-12, and zolpidem. Her primarily concern today is the Neck Pain   Initial Vital Signs:  Pulse/HCG Rate: 79  Temp: 97.7 F (36.5 C) Resp: 16 BP: (!) 129/92 SpO2: 100 %  BMI: Estimated body mass index is 31.96 kg/m as calculated from the following:   Height as of this encounter: 4' 7.5" (1.41 m).   Weight as of this encounter: 140 lb (63.5 kg).  Risk Assessment: Allergies: Reviewed. She is allergic to hypafix [wound dressings], ativan [lorazepam], and tizanidine.  Allergy Precautions: None required Coagulopathies: Reviewed. None identified.  Blood-thinner therapy: None at this time Active Infection(s): Reviewed. None identified. Brianna Arnold is afebrile  Site Confirmation: Brianna Arnold was asked to confirm the procedure and laterality before marking the site Procedure checklist: Completed Consent: Before the procedure and under the influence of no sedative(s), amnesic(s), or anxiolytics, the patient was informed of the treatment options, risks and possible complications. To fulfill our ethical and legal obligations, as recommended by the American Medical Association's Code of Ethics, I have informed the patient of my clinical impression; the nature and purpose of the treatment or procedure; the risks, benefits, and possible complications of the intervention; the alternatives, including doing nothing; the risk(s) and benefit(s) of the alternative treatment(s) or procedure(s); and the risk(s) and benefit(s) of doing nothing. The patient was provided information about the general risks and possible complications associated with the procedure. These may include, but are not limited to: failure to achieve desired goals, infection, bleeding, organ or nerve damage, allergic reactions, paralysis, and death. In addition, the patient was informed of those risks and complications associated to the  procedure, such as failure to decrease pain; infection; bleeding; organ or nerve damage with subsequent damage to sensory, motor, and/or autonomic systems, resulting  in permanent pain, numbness, and/or weakness of one or several areas of the body; allergic reactions; (i.e.: anaphylactic reaction); and/or death. Furthermore, the patient was informed of those risks and complications associated with the medications. These include, but are not limited to: allergic reactions (i.e.: anaphylactic or anaphylactoid reaction(s)); adrenal axis suppression; blood sugar elevation that in diabetics may result in ketoacidosis or comma; water retention that in patients with history of congestive heart failure may result in shortness of breath, pulmonary edema, and decompensation with resultant heart failure; weight gain; swelling or edema; medication-induced neural toxicity; particulate matter embolism and blood vessel occlusion with resultant organ, and/or nervous system infarction; and/or aseptic necrosis of one or more joints. Finally, the patient was informed that Medicine is not an exact science; therefore, there is also the possibility of unforeseen or unpredictable risks and/or possible complications that may result in a catastrophic outcome. The patient indicated having understood very clearly. We have given the patient no guarantees and we have made no promises. Enough time was given to the patient to ask questions, all of which were answered to the patient's satisfaction. Brianna Arnold has indicated that she wanted to continue with the procedure. Attestation: I, the ordering provider, attest that I have discussed with the patient the benefits, risks, side-effects, alternatives, likelihood of achieving goals, and potential problems during recovery for the procedure that I have provided informed consent. Date  Time: 08/08/2022  2:01 PM  Pre-Procedure Preparation:  Monitoring: As per clinic protocol. Respiration, ETCO2,  SpO2, BP, heart rate and rhythm monitor placed and checked for adequate function Safety Precautions: Patient was assessed for positional comfort and pressure points before starting the procedure. Time-out: I initiated and conducted the "Time-out" before starting the procedure, as per protocol. The patient was asked to participate by confirming the accuracy of the "Time Out" information. Verification of the correct person, site, and procedure were performed and confirmed by me, the nursing staff, and the patient. "Time-out" conducted as per Joint Commission's Universal Protocol (UP.01.01.01). Time: 1425  Description of Procedure:          Area Prepped: Entire Lower Lumbosacral Region & Right cervical region DuraPrep (Iodine Povacrylex [0.7% available iodine] and Isopropyl Alcohol, 74% w/w) Safety Precautions: Aspiration looking for blood return was conducted prior to all injections. At no point did we inject any substances, as a needle was being advanced. No attempts were made at seeking any paresthesias. Safe injection practices and needle disposal techniques used. Medications properly checked for expiration dates. SDV (single dose vial) medications used. Description of the Procedure: Protocol guidelines were followed. The patient was placed in position over the fluoroscopy table. The target area was identified and the area prepped in the usual manner. Skin & deeper tissues infiltrated with local anesthetic. Appropriate amount of time allowed to pass for local anesthetics to take effect. The procedure needles were then advanced to the target area. Proper needle placement secured. Negative aspiration confirmed. Solution injected in intermittent fashion, asking for systemic symptoms every 0.5cc of injectate. The needles were then removed and the area cleansed, making sure to leave some of the prepping solution back to take advantage of its long term bactericidal properties.  Vitals:   08/08/22 1410  08/08/22 1425 08/08/22 1430  BP: (!) 129/92 (!) 134/112 (!) 144/106  Pulse: 79 88 85  Resp: Temp: 97.7 F (36.5 C)    SpO2: 100% 100% 100%  Weight: 140 lb (63.5 kg)    Height: 4' 7.5" (1.41  m)         Start Time: 1425 hrs. End Time: 1430 hrs. Materials:  Needle(s) Type: Regular needle Gauge: 27G Length: 1.5-in  Medication(s): Please see orders for medications and dosing details.   17 trigger points were also injected in the right and left cervical, right and left trapezius, & lumbar paraspinal region with dry needling performed.  Each trigger point was injected with 0.5 to 1 cc of 0.2% ropivacaine.   Post-operative Assessment:  Post-procedure Vital Signs:  Pulse/HCG Rate: 85  Temp: 97.7 F (36.5 C) Resp: 15 BP: (!) 144/106 SpO2: 100 %  EBL: None  Complications: No immediate post-treatment complications observed by team, or reported by patient.  Note: The patient tolerated the entire procedure well. A repeat set of vitals were taken after the procedure and the patient was kept under observation following institutional policy, for this type of procedure. Post-procedural neurological assessment was performed, showing return to baseline, prior to discharge. The patient was provided with post-procedure discharge instructions, including a section on how to identify potential problems. Should any problems arise concerning this procedure, the patient was given instructions to immediately contact us, at any time, without hesitation. In any case, we plan to contact the patient by telephone for a follow-up status report regarding this interventional procedure.  Comments:  No additional relevant information.  Plan of Care   Medications ordered for procedure: Meds ordered this encounter  Medications   ropivacaine (PF) 2 mg/mL (0.2%) (NAROPIN) injection 9 mL   ropivacaine (PF) 2 mg/mL (0.2%) (NAROPIN) injection 9 mL    No orders of the defined types were placed in this  encounter.    Medications administered: We administered ropivacaine (PF) 2 mg/mL (0.2%) and ropivacaine (PF) 2 mg/mL (0.2%).  See the medical record for exact dosing, route, and time of administration.  Follow-up plan:   No follow-ups on file.       Pharmacological management options:  Opioid Analgesics: Butrans patch, side effects at 7.5 mcg an hour, analgesic benefit with no side effects at 5 mcg an hour, continue  Membrane stabilizer: Continue gabapentin as prescribed  Muscle relaxant: Continue Flexeril as prescribed  NSAID: To be determined at a later time  Other analgesic(s): To be determined at a later time    Interventional management options: Brianna Arnold was informed that there is no guarantee that she would be a candidate for interventional therapies. The decision will be based on the results of diagnostic studies, as well as Brianna Arnold's risk profile.  Procedure(s) hx Left axillary peripheral nerve stimulation: 12/30/19 providing significant pain relief.  Removed 02/29/2020              Recent Visits Date Type Provider Dept  07/11/22 Procedure visit Edward Jolly, MD [facility]  06/13/22 Procedure visit Edward Jolly, MD [facility]  05/23/22 Procedure visit Edward Jolly, MD [facility]  Showing recent visits within past 90 days and meeting all other requirements Today's Visits Date Type Provider Dept  08/08/22 Office Visit Edward Jolly, MD [facility]  Showing today's visits and meeting all other requirements Future Appointments Date Type Provider Dept  08/28/22 Appointment Edward Jolly, MD [facility]  Showing future appointments within next 90 days and meeting all other requirements  Disposition: Discharge home  Discharge (Date  Time): 08/08/2022; 1440 hrs.   Primary Care Physician: Marguarite Arbour, MD Location: [facility] Note by: Edward Jolly, MD Date: 08/08/2022; Time: 2:35  PM  Disclaimer:  Medicine is not an Chief Strategy Officer. The only guarantee in medicine is that nothing is guaranteed. It is important to note that the decision to proceed with this intervention was based on the information collected from the patient. The Data and conclusions were drawn from the patient's questionnaire, the interview, and the physical examination. Because the information was provided in large part by the patient, it cannot be guaranteed that it has not been purposely or unconsciously manipulated. Every effort has been made to obtain as much relevant data as possible for this evaluation. It is important to note that the conclusions that lead to this procedure are derived in large part from the available data. Always take into account that the treatment will also be dependent on availability of resources and existing treatment guidelines, considered by other Pain Management Practitioners as being common knowledge and practice, at the time of the intervention. For Medico-Legal purposes, it is also important to point out that variation in procedural techniques and pharmacological choices are the acceptable norm. The indications, contraindications, technique, and results of the above procedure should only be interpreted and judged by a Board-Certified Interventional Pain Specialist with extensive familiarity and expertise in the same exact procedure and technique.

## 2022-08-09 ENCOUNTER — Telehealth: Payer: Self-pay | Admitting: *Deleted

## 2022-08-09 NOTE — Telephone Encounter (Signed)
Called for post procedure check. No answer. LVM. 

## 2022-08-13 DIAGNOSIS — M25552 Pain in left hip: Secondary | ICD-10-CM | POA: Diagnosis not present

## 2022-08-13 DIAGNOSIS — M25551 Pain in right hip: Secondary | ICD-10-CM | POA: Diagnosis not present

## 2022-08-21 DIAGNOSIS — M25552 Pain in left hip: Secondary | ICD-10-CM | POA: Diagnosis not present

## 2022-08-21 DIAGNOSIS — M25551 Pain in right hip: Secondary | ICD-10-CM | POA: Diagnosis not present

## 2022-08-28 ENCOUNTER — Encounter: Payer: Self-pay | Admitting: Student in an Organized Health Care Education/Training Program

## 2022-08-28 ENCOUNTER — Ambulatory Visit
Payer: Medicare HMO | Attending: Student in an Organized Health Care Education/Training Program | Admitting: Student in an Organized Health Care Education/Training Program

## 2022-08-28 VITALS — BP 126/90 | HR 84 | Temp 98.4°F | Ht <= 58 in | Wt 142.0 lb

## 2022-08-28 DIAGNOSIS — Z981 Arthrodesis status: Secondary | ICD-10-CM | POA: Insufficient documentation

## 2022-08-28 DIAGNOSIS — M4105 Infantile idiopathic scoliosis, thoracolumbar region: Secondary | ICD-10-CM | POA: Diagnosis not present

## 2022-08-28 DIAGNOSIS — M7918 Myalgia, other site: Secondary | ICD-10-CM | POA: Insufficient documentation

## 2022-08-28 MED ORDER — BUPRENORPHINE 5 MCG/HR TD PTWK: 1.0000 | MEDICATED_PATCH | TRANSDERMAL | 3 refills | Status: AC

## 2022-08-28 MED ORDER — GABAPENTIN 250 MG/5ML PO SOLN: 300.0000 mg | Freq: Three times a day (TID) | ORAL | 2 refills | Status: AC

## 2022-08-28 MED ORDER — ROPIVACAINE HCL 2 MG/ML IJ SOLN
9.0000 mL | Freq: Once | INTRAMUSCULAR | Status: AC
  Administered 2022-08-28: 9 mL

## 2022-08-28 MED ORDER — ROPIVACAINE HCL 2 MG/ML IJ SOLN
INTRAMUSCULAR | Status: AC
  Filled 2022-08-28: qty 20

## 2022-08-28 NOTE — Patient Instructions (Signed)

## 2022-08-28 NOTE — Progress Notes (Signed)
PROVIDER NOTE: Information contained herein reflects review and annotations entered in association with encounter. Interpretation of such information and data should be left to medically-trained personnel. Information provided to patient can be located elsewhere in the medical record under "Patient Instructions". Document created using STT-dictation technology, any transcriptional errors that may result from process are unintentional.    Patient: Brianna Arnold  Service Category: E/M  Provider: Edward Jolly, MD  DOB: 03-19-1991  DOS: 08/28/2022  Specialty: Interventional Pain Management  MRN: [id number]  Setting: Ambulatory outpatient  PCP: Marguarite Arbour, MD  Type: Established Patient    Referring Provider: Marguarite Arbour, MD  Location: Office  Delivery: Face-to-face     HPI  Brianna Arnold, a 32 y.o. 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 Ms. Sneed's primary complain today is Neck Pain Last encounter: My last encounter with her was on 08/08/22 Pertinent problems: Ms. Kost has Status post cervical spinal fusion; Migraine without aura and without status migrainosus, not intractable; History of lumbar spinal fusion (T11- iliac); Cervicalgia; Chronic left shoulder pain; Disorder of left rotator cuff; and Chronic pain syndrome on their pertinent problem list. Pain Assessment: Severity of Chronic pain is reported as a 6 /10. Location: Neck  /radiates up back of head. Onset: More than a month ago. Quality: Aching, Burning, Constant, Discomfort. Timing: Constant. Modifying factor(s): meds, ice, sleep. Vitals:  height is 4\' 7"  (1.397 m) and weight is 142 lb (64.4 kg). Her temporal temperature is 98.4 F (36.9 C). Her blood pressure is 126/90 (abnormal) and her pulse is 84. Her oxygen saturation is 100%.   Reason for encounter: medication management.  No change in medical history since last visit.  Patient's pain is at  baseline.  Patient continues multimodal pain regimen as prescribed: Butrans patch 5 mcg an hour and Gabapentin 300 mg TID.  States that it provides pain relief and improvement in functional status. Finds benefit with every 3 to every 4 week cervical, thoracic, lumbar TPI's. See attached note for TPI    Pharmacotherapy Assessment  Analgesic: Butrans 5 mcg/hr q 7days   Monitoring: [place] PMP: PDMP reviewed during this encounter.       Pharmacotherapy: No side-effects or adverse reactions reported. Compliance: No problems identified. Effectiveness: Clinically acceptable.  Florina Ou, RN  08/28/2022  9:51 AM  Sign when Signing Visit Nursing Pain Medication Assessment:  Safety precautions to be maintained throughout the outpatient stay will include: orient to surroundings, keep bed in low position, maintain call bell within reach at all times, provide assistance with transfer out of bed and ambulation.  Medication Inspection Compliance: Pill count conducted under aseptic conditions, in front of the patient. Neither the pills nor the bottle was removed from the patient's sight at any time. Once count was completed pills were immediately returned to the patient in their original bottle.  Medication: Buprenorphine (Suboxone) Pill/Patch Count:  3 of 4 pills remain Pill/Patch Appearance: Markings consistent with prescribed medication Bottle Appearance: Standard pharmacy container. Clearly labeled. Filled Date: 4 / 66 / 2024 Last Medication intake:   one week ago Safety precautions to be maintained throughout the outpatient stay will include: orient to surroundings, keep bed in low position, maintain call bell within reach at all times, provide assistance with transfer out of bed and ambulation.     UDS:  Summary  Date Value Ref Range Status  10/15/2019 Note  Final    Comment:    ====================================================================  Compliance Drug Analysis,  Ur ==================================================================== Test                             Result       Flag       Units  Drug Absent but Declared for Prescription Verification   Buprenorphine                  Not Detected UNEXPECTED ng/mg creat    Transdermal buprenorphine, as indicated in the declared medication    list, is not always detected even when used as directed.    Tramadol                       Not Detected UNEXPECTED ng/mg creat   Gabapentin                     Not Detected UNEXPECTED   Cyclobenzaprine                Not Detected UNEXPECTED   Trazodone                      Not Detected UNEXPECTED   Promethazine                   Not Detected UNEXPECTED ==================================================================== Test                      Result    Flag   Units      Ref Range   Creatinine              91               mg/dL      >=16 ==================================================================== Declared Medications:  The flagging and interpretation on this report are based on the  following declared medications.  Unexpected results may arise from  inaccuracies in the declared medications.   **Note: The testing scope of this panel includes these medications:   Cyclobenzaprine (Flexeril)  Gabapentin (Neurontin)  Promethazine (Phenergan)  Tramadol (Ultram)  Trazodone (Desyrel)   **Note: The testing scope of this panel does not include small to  moderate amounts of these reported medications:   Buprenorphine Patch (BuTrans)   **Note: The testing scope of this panel does not include the  following reported medications:   Albuterol (Ventolin HFA)  Ethinyl Estradiol (Sprintec)  Famotidine (Pepcid)  Iron  Norgestimate (Sprintec)  Vitamin B12  Vitamin D3 ==================================================================== For clinical consultation, please call [phone]. ====================================================================      ROS  Constitutional: Denies any fever or chills Gastrointestinal: No reported hemesis, hematochezia, vomiting, or acute GI distress Musculoskeletal:  Chronic cervical, thoracic, lumbar spine pain, left hip pain Neurological: No reported episodes of acute onset apraxia, aphasia, dysarthria, agnosia, amnesia, paralysis, loss of coordination, or loss of consciousness  Medication Review  Vitamin D3, albuterol, baclofen, buprenorphine, cyclobenzaprine, dicyclomine, ergocalciferol, famotidine, ferrous sulfate, gabapentin, medroxyPROGESTERone, meloxicam, methocarbamol, ofloxacin, promethazine, propranolol ER, traMADol, venlafaxine XR, vitamin B-12, and zolpidem  History Review  Allergy: Ms. Birth is allergic to hypafix [wound dressings], ativan [lorazepam], and tizanidine. Drug: Ms. Kindschi  reports no history of drug use. Alcohol:  has no history on file for alcohol use. Tobacco:  reports that she has never smoked. She has never used smokeless tobacco. Social: Ms. Andrew  reports that she has never smoked. She has never used smokeless tobacco. She reports  that she does not use drugs. Medical:  has a past medical history of Anemia and Jarcho-Levin syndrome. Surgical: Ms. Wagener  has a past surgical history that includes Cervical spine surgery (07/2014); Back surgery (10/2014); and Anterior cervical decomp/discectomy fusion (N/A, 02/11/2019). Family: family history includes Hypertension in her father.  Laboratory Chemistry Profile   Renal Lab Results  Component Value Date   BUN 6 02/15/2019   CREATININE 0.63 02/15/2019   GFRAA >60 02/15/2019   GFRNONAA >60 02/15/2019    Hepatic Lab Results  Component Value Date   AST 17 02/14/2019   ALT 12 02/14/2019   ALBUMIN 3.9 02/14/2019   ALKPHOS 55 02/14/2019   LIPASE 18 02/14/2019    Electrolytes Lab Results  Component Value Date   NA 137 02/15/2019   K  4.1 02/15/2019   CL 106 02/15/2019   CALCIUM 8.1 (L) 02/15/2019    Bone No results found for: "VD25OH", "VD125OH2TOT", "ZO1096EA5", "WU9811BJ4", "25OHVITD1", "25OHVITD2", "25OHVITD3", "TESTOFREE", "TESTOSTERONE"  Inflammation (CRP: Acute Phase) (ESR: Chronic Phase) Lab Results  Component Value Date   LATICACIDVEN 1.6 04/19/2015         Note: Above Lab results reviewed.  Recent Imaging Review  [place] LIMITED JOINT SPACE STRUCTURES LOW BILAT Procedure:  Injection of right hip joint under ultrasound guidance. Ultrasound guidance utilized for in-plane approach to the right hip joint,  no effusion noted Samsung HS60 device utilized with permanent recording / reporting. Verbal informed consent obtained and verified. Skin prepped in a sterile fashion. Ethyl chloride for topical local analgesia.  Completed without difficulty and tolerated well. Medication: triamcinolone acetonide 40 mg/mL suspension for injection 1 mL  total and 2 mL lidocaine 1% without epinephrine utilized for needle  placement anesthetic Advised to contact for fevers/chills, erythema, induration, drainage, or  persistent bleeding.  Procedure:  Injection of right greater trochanter under ultrasound  guidance. Ultrasound guidance utilized for out of plane approach to the right  greater trochanter, no clearly visualized sonographic evidence of  tendinopathy Samsung HS60 device utilized with permanent recording / reporting. Verbal informed consent obtained and verified. Skin prepped in a sterile fashion. Ethyl chloride for topical local analgesia.  Completed without difficulty and tolerated well. Medication: triamcinolone acetonide 40 mg/mL suspension for injection 1 mL  total and 2 mL lidocaine 1% without epinephrine utilized for needle  placement anesthetic Advised to contact for fevers/chills, erythema, induration, drainage, or  persistent bleeding.  Procedure:  Injection of left hip joint under ultrasound  guidance. Ultrasound guidance utilized for in-plane approach to left hip joint, no  effusion visualized Samsung HS60 device utilized with permanent recording / reporting. Verbal informed consent obtained and verified. Skin prepped in a sterile fashion. Ethyl chloride for topical local analgesia.  Completed without difficulty and tolerated well. Medication: triamcinolone acetonide 40 mg/mL suspension for injection 1 mL  total and 2 mL lidocaine 1% without epinephrine utilized for needle  placement anesthetic Advised to contact for fevers/chills, erythema, induration, drainage, or  persistent bleeding.  Procedure:  Injection of left greater trochanter under ultrasound  guidance. Ultrasound guidance utilized for out of plane approach to left greater  trochanteric region, no effusion Samsung HS60 device utilized with permanent recording / reporting. Verbal informed consent obtained and verified. Skin prepped in a sterile fashion. Ethyl chloride for topical local analgesia.  Completed without difficulty and tolerated well. Medication: triamcinolone acetonide 40 mg/mL suspension for injection 1 mL  total and 2 mL lidocaine 1% without epinephrine utilized for needle  placement anesthetic Advised to contact  for fevers/chills, erythema, induration, drainage, or  persistent bleeding. Note: Reviewed        Physical Exam  General appearance: Well nourished, well developed, and well hydrated. In no apparent acute distress Mental status: Alert, oriented x 3 (person, place, & time)       Respiratory: No evidence of acute respiratory distress Eyes: [name] Vitals: BP (!) 126/90   Pulse 84   Temp 98.4 F (36.9 C) (Temporal)   Ht 4\' 7"  (1.397 m)   Wt 142 lb (64.4 kg)   SpO2 100%   BMI 33.00 kg/m  BMI: Estimated body mass index is 33 kg/m as calculated from the following:   Height as of this encounter: 4\' 7"  (1.397 m).   Weight as of this encounter: 142 lb (64.4 kg). Ideal: Patient must be  at least 60 in tall to calculate ideal body weight  Cervical Spine Exam  Skin & Axial Inspection: Well healed scar from previous spine surgery detected Alignment: Asymmetric Functional ROM: Pain restricted ROM      Stability: No instability detected Muscle Tone/Strength: Functionally intact. No obvious neuro-muscular anomalies detected. Sensory (Neurological): Neuropathic pain pattern Palpation: No palpable anomalies                 Thoracic Spine Area Exam  Skin & Axial Inspection: Well healed scar from previous spine surgery detected Alignment: Symmetrical Functional ROM: Mechanically restricted ROM Stability: No instability detected Muscle Tone/Strength: Functionally intact. No obvious neuro-muscular anomalies detected. Sensory (Neurological): Neurogenic pain pattern Muscle strength & Tone: No palpable anomalies   Lumbar Exam  Skin & Axial Inspection: Well healed scar from previous spine surgery detected Alignment: Scoliosis detected, significant Functional ROM: Pain restricted ROM       Stability: No instability detected Muscle Tone/Strength: Functionally intact. No obvious neuro-muscular anomalies detected. Sensory (Neurological):  Musculoskeletal pain pattern Palpation: Complains of area being tender to palpation         Gait & Posture Assessment  Ambulation: Limited Gait: Antalgic Posture: Difficulty standing up straight, due to pain    Lower Extremity Exam      Side: Right lower extremity   Side: Left lower extremity  Stability: No instability observed           Stability: No instability observed          Skin & Extremity Inspection: Skin color, temperature, and hair growth are WNL. No peripheral edema or cyanosis. No masses, redness, swelling, asymmetry, or associated skin lesions. No contractures.   Skin & Extremity Inspection: Skin color, temperature, and hair growth are WNL. No peripheral edema or cyanosis. No masses, redness, swelling, asymmetry, or associated skin  lesions. No contractures.  Functional ROM: Pain restricted ROM of the left hip, pain overlying SI joint and piriformis                   Functional ROM: Pain restricted ROM                  Muscle Tone/Strength: Functionally intact. No obvious neuro-muscular anomalies detected.   Muscle Tone/Strength: Functionally intact. No obvious neuro-muscular anomalies detected.  Sensory (Neurological): Musculoskeletal       Sensory (Neurological): Unimpaired        DTR: Patellar: deferred today Achilles: deferred today Plantar: deferred today   DTR: Patellar: deferred today Achilles: deferred today Plantar: deferred today  Palpation: No palpable anomalies   Palpation: No palpable anomalies      Assessment   Diagnosis Status  1. Myofascial  pain syndrome of lumbar spine   2. Infantile idiopathic scoliosis of thoracolumbar region   3. History of lumbar spinal fusion (T11- iliac)     Controlled Controlled Controlled     Plan of Care    Ms. Rhaelynn Bruch Hannan has a current medication list which includes the following long-term medication(s): albuterol, dicyclomine, famotidine, ferrous sulfate, medroxyprogesterone, promethazine, propranolol er, zolpidem, and gabapentin.  Pharmacotherapy (Medications Ordered): Meds ordered this encounter  Medications   ropivacaine (PF) 2 mg/mL (0.2%) (NAROPIN) injection 9 mL   ropivacaine (PF) 2 mg/mL (0.2%) (NAROPIN) injection 9 mL   buprenorphine (BUTRANS) 5 MCG/HR PTWK    Sig: Place 1 patch onto the skin once a week.    Dispense:  4 patch    Refill:  3    Chronic Pain: STOP Act (Not applicable) Fill 1 day early if closed on refill date. Avoid benzodiazepines within 8 hours of opioids   gabapentin (NEURONTIN) 250 MG/5ML solution    Sig: Take 6 mLs (300 mg total) by mouth 3 (three) times daily.    Dispense:  470 mL    Refill:  2    Follow-up plan:   Return in about 3 months (around 11/28/2022) for Medication Management, in person.      Pharmacological management options:  Opioid Analgesics: Butrans patch, side effects at 7.5 mcg an hour, analgesic benefit with no side effects at 5 mcg an hour, continue  Membrane stabilizer: Continue gabapentin as prescribed  Muscle relaxant: Continue Flexeril as prescribed  NSAID: To be determined at a later time  Other analgesic(s): To be determined at a later time    Interventional management options: Ms. Fricano was informed that there is no guarantee that she would be a candidate for interventional therapies. The decision will be based on the results of diagnostic studies, as well as Ms. Pagett's risk profile.  Procedure(s) hx Left axillary peripheral nerve stimulation: 12/30/19 providing significant pain relief.  Removed 02/29/2020               Recent Visits Date Type Provider Dept  08/08/22 Office Visit Edward Jolly, MD [facility]  07/11/22 Procedure visit Edward Jolly, MD [facility]  06/13/22 Procedure visit Edward Jolly, MD [facility]  Showing recent visits within past 90 days and meeting all other requirements Today's Visits Date Type Provider Dept  08/28/22 Office Visit Edward Jolly, MD [facility]  Showing today's visits and meeting all other requirements Future Appointments Date Type Provider Dept  09/19/22 Appointment Edward Jolly, MD [facility]  Showing future appointments within next 90 days and meeting all other requirements  I discussed the assessment and treatment plan with the patient. The patient was provided an opportunity to ask questions and all were answered. The patient agreed with the plan and demonstrated an understanding of the instructions.  Patient advised to call back or seek an in-person evaluation if the symptoms or condition worsens.  Duration of encounter: .  Note by: Edward Jolly, MD Date: 08/28/2022; Time: 10:42 AM

## 2022-08-28 NOTE — Progress Notes (Signed)
PROVIDER NOTE: Information contained herein reflects review and annotations entered in association with encounter. Interpretation of such information and data should be left to medically-trained personnel. Information provided to patient can be located elsewhere in the medical record under "Patient Instructions". Document created using STT-dictation technology, any transcriptional errors that may result from process are unintentional.    Patient: Brianna Arnold  Service Category: Procedure  Provider: Edward Jolly, MD  DOB: [birth year]  DOS: 08/28/2022  Location: [facility]  MRN: [id number]  Setting: Ambulatory - outpatient  Referring Provider: Marguarite Arbour, MD  Type: Established Patient  Specialty: Interventional Pain Management  PCP: Marguarite Arbour, MD   Primary Reason for Visit: Interventional Pain Management Treatment. CC: Neck Pain   Procedure:          Anesthesia, Analgesia, Anxiolysis:  Type: Lumbar Paraspinal Muscle Trigger Point Injection (3+)         Quadratus lumborum, erector spinae, multifidus and periscapular region and bilateral cervical/trapezius TPI CPT: 20553 Primary Purpose: Therapeutic Approach: Percutaneous, ipsilateral approach. Laterality: Midline        Type: Local Anesthesia   Position: Prone   Indications: 1. Myofascial pain syndrome of lumbar spine   2. Infantile idiopathic scoliosis of thoracolumbar region   3. History of lumbar spinal fusion (T11- iliac)     Pain Score: Pre-procedure: 6 /10 Post-procedure: 6 /10   Pre-op H&P Assessment:  Brianna Arnold is a 32 y.o. (year old), female patient, seen today for interventional treatment. She  has a past surgical history that includes Cervical spine surgery (07/2014); Back surgery (10/2014); and Anterior cervical decomp/discectomy fusion (N/A, 02/11/2019). Ms. Prior has a current medication list which includes the following prescription(s): albuterol, baclofen, vitamin d3,  cyclobenzaprine, dicyclomine, ergocalciferol, famotidine, ferrous sulfate, medroxyprogesterone, meloxicam, methocarbamol, ofloxacin, promethazine, propranolol er, tramadol, venlafaxine xr, vitamin b-12, zolpidem, buprenorphine, and gabapentin. Her primarily concern today is the Neck Pain   Initial Vital Signs:  Pulse/HCG Rate: 84  Temp: 98.4 F (36.9 C) Resp:   BP: (!) 126/90 SpO2: 100 %  BMI: Estimated body mass index is 33 kg/m as calculated from the following:   Height as of this encounter: 4\' 7"  (1.397 m).   Weight as of this encounter: 142 lb (64.4 kg).  Risk Assessment: Allergies: Reviewed. She is allergic to hypafix [wound dressings], ativan [lorazepam], and tizanidine.  Allergy Precautions: None required Coagulopathies: Reviewed. None identified.  Blood-thinner therapy: None at this time Active Infection(s): Reviewed. None identified. Brianna Arnold is afebrile  Site Confirmation: Brianna Arnold was asked to confirm the procedure and laterality before marking the site Procedure checklist: Completed Consent: Before the procedure and under the influence of no sedative(s), amnesic(s), or anxiolytics, the patient was informed of the treatment options, risks and possible complications. To fulfill our ethical and legal obligations, as recommended by the American Medical Association's Code of Ethics, I have informed the patient of my clinical impression; the nature and purpose of the treatment or procedure; the risks, benefits, and possible complications of the intervention; the alternatives, including doing nothing; the risk(s) and benefit(s) of the alternative treatment(s) or procedure(s); and the risk(s) and benefit(s) of doing nothing. The patient was provided information about the general risks and possible complications associated with the procedure. These may include, but are not limited to: failure to achieve desired goals, infection, bleeding, organ or nerve damage, allergic reactions,  paralysis, and death. In addition, the patient was informed of those risks and complications associated to the procedure, such as  failure to decrease pain; infection; bleeding; organ or nerve damage with subsequent damage to sensory, motor, and/or autonomic systems, resulting in permanent pain, numbness, and/or weakness of one or several areas of the body; allergic reactions; (i.e.: anaphylactic reaction); and/or death. Furthermore, the patient was informed of those risks and complications associated with the medications. These include, but are not limited to: allergic reactions (i.e.: anaphylactic or anaphylactoid reaction(s)); adrenal axis suppression; blood sugar elevation that in diabetics may result in ketoacidosis or comma; water retention that in patients with history of congestive heart failure may result in shortness of breath, pulmonary edema, and decompensation with resultant heart failure; weight gain; swelling or edema; medication-induced neural toxicity; particulate matter embolism and blood vessel occlusion with resultant organ, and/or nervous system infarction; and/or aseptic necrosis of one or more joints. Finally, the patient was informed that Medicine is not an exact science; therefore, there is also the possibility of unforeseen or unpredictable risks and/or possible complications that may result in a catastrophic outcome. The patient indicated having understood very clearly. We have given the patient no guarantees and we have made no promises. Enough time was given to the patient to ask questions, all of which were answered to the patient's satisfaction. Ms. Labor has indicated that she wanted to continue with the procedure. Attestation: I, the ordering provider, attest that I have discussed with the patient the benefits, risks, side-effects, alternatives, likelihood of achieving goals, and potential problems during recovery for the procedure that I have provided informed consent. Date   Time: 08/28/2022  9:38 AM  Pre-Procedure Preparation:  Monitoring: As per clinic protocol. Respiration, ETCO2, SpO2, BP, heart rate and rhythm monitor placed and checked for adequate function Safety Precautions: Patient was assessed for positional comfort and pressure points before starting the procedure. Time-out: I initiated and conducted the "Time-out" before starting the procedure, as per protocol. The patient was asked to participate by confirming the accuracy of the "Time Out" information. Verification of the correct person, site, and procedure were performed and confirmed by me, the nursing staff, and the patient. "Time-out" conducted as per Joint Commission's Universal Protocol ([email or web address].01.01.01). Time: 1005  Description of Procedure:          Area Prepped: Entire Lower Lumbosacral Region & Right cervical region DuraPrep (Iodine Povacrylex [0.7% available iodine] and Isopropyl Alcohol, 74% w/w) Safety Precautions: Aspiration looking for blood return was conducted prior to all injections. At no point did we inject any substances, as a needle was being advanced. No attempts were made at seeking any paresthesias. Safe injection practices and needle disposal techniques used. Medications properly checked for expiration dates. SDV (single dose vial) medications used. Description of the Procedure: Protocol guidelines were followed. The patient was placed in position over the fluoroscopy table. The target area was identified and the area prepped in the usual manner. Skin & deeper tissues infiltrated with local anesthetic. Appropriate amount of time allowed to pass for local anesthetics to take effect. The procedure needles were then advanced to the target area. Proper needle placement secured. Negative aspiration confirmed. Solution injected in intermittent fashion, asking for systemic symptoms every 0.5cc of injectate. The needles were then removed and the area cleansed, making sure to leave some of the  prepping solution back to take advantage of its long term bactericidal properties.  Vitals:   08/28/22 0946  BP: (!) 126/90  Pulse: 84  Temp: 98.4 F (36.9 C)  TempSrc: Temporal  SpO2: 100%  Weight: 142 lb (64.4 kg)  Height: 4'  7" (1.397 m)       Start Time: 1005 hrs. End Time: 1008 hrs. Materials:  Needle(s) Type: Regular needle Gauge: 27G Length: 1.5-in  Medication(s): Please see orders for medications and dosing details.   17 trigger points were also injected in the right and left cervical, right and left trapezius, & lumbar paraspinal region with dry needling performed.  Each trigger point was injected with 0.5 to 1 cc of 0.2% ropivacaine.   Post-operative Assessment:  Post-procedure Vital Signs:  Pulse/HCG Rate: 84  Temp: 98.4 F (36.9 C) Resp:   BP: (!) 126/90 SpO2: 100 %  EBL: None  Complications: No immediate post-treatment complications observed by team, or reported by patient.  Note: The patient tolerated the entire procedure well. A repeat set of vitals were taken after the procedure and the patient was kept under observation following institutional policy, for this type of procedure. Post-procedural neurological assessment was performed, showing return to baseline, prior to discharge. The patient was provided with post-procedure discharge instructions, including a section on how to identify potential problems. Should any problems arise concerning this procedure, the patient was given instructions to immediately contact us, at any time, without hesitation. In any case, we plan to contact the patient by telephone for a follow-up status report regarding this interventional procedure.  Comments:  No additional relevant information.  Plan of Care   Medications ordered for procedure: Meds ordered this encounter  Medications   ropivacaine (PF) 2 mg/mL (0.2%) (NAROPIN) injection 9 mL   ropivacaine (PF) 2 mg/mL (0.2%) (NAROPIN) injection 9 mL   buprenorphine  (BUTRANS) 5 MCG/HR PTWK    Sig: Place 1 patch onto the skin once a week.    Dispense:  4 patch    Refill:  3    Chronic Pain: STOP Act (Not applicable) Fill 1 day early if closed on refill date. Avoid benzodiazepines within 8 hours of opioids   gabapentin (NEURONTIN) 250 MG/5ML solution    Sig: Take 6 mLs (300 mg total) by mouth 3 (three) times daily.    Dispense:  470 mL    Refill:  2    No orders of the defined types were placed in this encounter.    Medications administered: We administered ropivacaine (PF) 2 mg/mL (0.2%) and ropivacaine (PF) 2 mg/mL (0.2%).  See the medical record for exact dosing, route, and time of administration.  Follow-up plan:   Return in about 3 months (around 11/28/2022) for Medication Management, in person.       Pharmacological management options:  Opioid Analgesics: Butrans patch, side effects at 7.5 mcg an hour, analgesic benefit with no side effects at 5 mcg an hour, continue  Membrane stabilizer: Continue gabapentin as prescribed  Muscle relaxant: Continue Flexeril as prescribed  NSAID: To be determined at a later time  Other analgesic(s): To be determined at a later time    Interventional management options: Ms. Hetzel was informed that there is no guarantee that she would be a candidate for interventional therapies. The decision will be based on the results of diagnostic studies, as well as Ms. Zahn's risk profile.  Procedure(s) hx Left axillary peripheral nerve stimulation: 12/30/19 providing significant pain relief.  Removed 02/29/2020              Recent Visits Date Type Provider Dept  08/08/22 Office Visit Edward Jolly, MD [facility]  07/11/22 Procedure visit Edward Jolly, MD [facility]  06/13/22 Procedure visit Edward Jolly, MD [facility]  Showing recent visits within past 90 days and meeting all other requirements Today's Visits Date Type Provider Dept  08/28/22 Office Visit Edward Jolly, MD  [facility]  Showing today's visits and meeting all other requirements Future Appointments Date Type Provider Dept  09/19/22 Appointment Edward Jolly, MD [facility]  Showing future appointments within next 90 days and meeting all other requirements  Disposition: Discharge home  Discharge (Date  Time): 08/28/2022; 1010 hrs.   Primary Care Physician: Marguarite Arbour, MD Location: [facility] Note by: Edward Jolly, MD Date: 08/28/2022; Time: 10:42 AM  Disclaimer:  Medicine is not an exact science. The only guarantee in medicine is that nothing is guaranteed. It is important to note that the decision to proceed with this intervention was based on the information collected from the patient. The Data and conclusions were drawn from the patient's questionnaire, the interview, and the physical examination. Because the information was provided in large part by the patient, it cannot be guaranteed that it has not been purposely or unconsciously manipulated. Every effort has been made to obtain as much relevant data as possible for this evaluation. It is important to note that the conclusions that lead to this procedure are derived in large part from the available data. Always take into account that the treatment will also be dependent on availability of resources and existing treatment guidelines, considered by other Pain Management Practitioners as being common knowledge and practice, at the time of the intervention. For Medico-Legal purposes, it is also important to point out that variation in procedural techniques and pharmacological choices are the acceptable norm. The indications, contraindications, technique, and results of the above procedure should only be interpreted and judged by a Board-Certified Interventional Pain Specialist with extensive familiarity and expertise in the same exact procedure and technique.

## 2022-08-28 NOTE — Progress Notes (Signed)
Nursing Pain Medication Assessment:  Safety precautions to be maintained throughout the outpatient stay will include: orient to surroundings, keep bed in low position, maintain call bell within reach at all times, provide assistance with transfer out of bed and ambulation.  Medication Inspection Compliance: Pill count conducted under aseptic conditions, in front of the patient. Neither the pills nor the bottle was removed from the patient's sight at any time. Once count was completed pills were immediately returned to the patient in their original bottle.  Medication: Buprenorphine (Suboxone) Pill/Patch Count:  3 of 4 pills remain Pill/Patch Appearance: Markings consistent with prescribed medication Bottle Appearance: Standard pharmacy container. Clearly labeled. Filled Date: 4 / 25 / 2024 Last Medication intake:   one week ago Safety precautions to be maintained throughout the outpatient stay will include: orient to surroundings, keep bed in low position, maintain call bell within reach at all times, provide assistance with transfer out of bed and ambulation.

## 2022-08-29 ENCOUNTER — Ambulatory Visit: Payer: Medicare HMO | Admitting: Student in an Organized Health Care Education/Training Program

## 2022-08-30 ENCOUNTER — Encounter: Payer: Medicare HMO | Admitting: Student in an Organized Health Care Education/Training Program

## 2022-08-30 DIAGNOSIS — Z981 Arthrodesis status: Secondary | ICD-10-CM | POA: Diagnosis not present

## 2022-08-30 DIAGNOSIS — G894 Chronic pain syndrome: Secondary | ICD-10-CM | POA: Diagnosis not present

## 2022-08-30 DIAGNOSIS — M4105 Infantile idiopathic scoliosis, thoracolumbar region: Secondary | ICD-10-CM | POA: Diagnosis not present

## 2022-08-31 DIAGNOSIS — M25551 Pain in right hip: Secondary | ICD-10-CM | POA: Diagnosis not present

## 2022-08-31 DIAGNOSIS — M25552 Pain in left hip: Secondary | ICD-10-CM | POA: Diagnosis not present

## 2022-09-04 DIAGNOSIS — M25551 Pain in right hip: Secondary | ICD-10-CM | POA: Diagnosis not present

## 2022-09-04 DIAGNOSIS — M25552 Pain in left hip: Secondary | ICD-10-CM | POA: Diagnosis not present

## 2022-09-12 DIAGNOSIS — M25552 Pain in left hip: Secondary | ICD-10-CM | POA: Diagnosis not present

## 2022-09-12 DIAGNOSIS — M25551 Pain in right hip: Secondary | ICD-10-CM | POA: Diagnosis not present

## 2022-09-18 DIAGNOSIS — M25551 Pain in right hip: Secondary | ICD-10-CM | POA: Diagnosis not present

## 2022-09-18 DIAGNOSIS — M25552 Pain in left hip: Secondary | ICD-10-CM | POA: Diagnosis not present

## 2022-09-19 ENCOUNTER — Encounter: Payer: Self-pay | Admitting: Student in an Organized Health Care Education/Training Program

## 2022-09-19 ENCOUNTER — Ambulatory Visit
Payer: Medicare HMO | Attending: Student in an Organized Health Care Education/Training Program | Admitting: Student in an Organized Health Care Education/Training Program

## 2022-09-19 VITALS — BP 118/80 | HR 87 | Temp 98.2°F | Resp 18 | Ht <= 58 in | Wt 140.0 lb

## 2022-09-19 DIAGNOSIS — M7918 Myalgia, other site: Secondary | ICD-10-CM | POA: Diagnosis not present

## 2022-09-19 DIAGNOSIS — M4105 Infantile idiopathic scoliosis, thoracolumbar region: Secondary | ICD-10-CM | POA: Diagnosis not present

## 2022-09-19 MED ORDER — ROPIVACAINE HCL 2 MG/ML IJ SOLN
9.0000 mL | Freq: Once | INTRAMUSCULAR | Status: AC
  Administered 2022-09-19: 9 mL via PERINEURAL
  Filled 2022-09-19: qty 20

## 2022-09-19 NOTE — Patient Instructions (Signed)

## 2022-09-19 NOTE — Progress Notes (Signed)
PROVIDER NOTE: Information contained herein reflects review and annotations entered in association with encounter. Interpretation of such information and data should be left to medically-trained personnel. Information provided to patient can be located elsewhere in the medical record under "Patient Instructions". Document created using STT-dictation technology, any transcriptional errors that may result from process are unintentional.    Patient: Brianna Arnold  Service Category: Procedure  Provider: Edward Jolly, MD  DOB: 1990/11/21  DOS: 09/19/2022  Location: [facility]  MRN: [id number]  Setting: Ambulatory - outpatient  Referring Provider: Marguarite Arbour, MD  Type: Established Patient  Specialty: Interventional Pain Management  PCP: Marguarite Arbour, MD   Primary Reason for Visit: Interventional Pain Management Treatment. CC: Neck Pain and Shoulder Pain (bilateral)   Procedure:          Anesthesia, Analgesia, Anxiolysis:  Type: Lumbar Paraspinal Muscle Trigger Point Injection (3+)         Quadratus lumborum, erector spinae, multifidus and periscapular region and bilateral cervical/trapezius TPI CPT: 20553 Primary Purpose: Therapeutic Approach: Percutaneous, ipsilateral approach. Laterality: Midline        Type: Local Anesthesia   Position: Prone   Indications: 1. Myofascial pain syndrome of lumbar spine   2. Infantile idiopathic scoliosis of thoracolumbar region     Pain Score: Pre-procedure: 6 /10 Post-procedure: 3 /10   Pre-op H&P Assessment:  Brianna Arnold is a 32 y.o. (year old), female patient, seen today for interventional treatment. She  has a past surgical history that includes Cervical spine surgery (07/2014); Back surgery (10/2014); and Anterior cervical decomp/discectomy fusion (N/A, 02/11/2019). Brianna Arnold has a current medication list which includes the following prescription(s): albuterol, baclofen, buprenorphine, vitamin d3, cyclobenzaprine,  dicyclomine, ergocalciferol, famotidine, ferrous sulfate, gabapentin, medroxyprogesterone, meloxicam, methocarbamol, ofloxacin, promethazine, propranolol er, tramadol, venlafaxine xr, vitamin b-12, and zolpidem. Her primarily concern today is the Neck Pain and Shoulder Pain (bilateral)   Initial Vital Signs:  Pulse/HCG Rate: 87  Temp: 98.2 F (36.8 C) Resp: 18 BP: 118/80 SpO2: 100 %  BMI: Estimated body mass index is 32.54 kg/m as calculated from the following:   Height as of this encounter: 4\' 7"  (1.397 m).   Weight as of this encounter: 140 lb (63.5 kg).  Risk Assessment: Allergies: Reviewed. She is allergic to hypafix [wound dressings], ativan [lorazepam], and tizanidine.  Allergy Precautions: None required Coagulopathies: Reviewed. None identified.  Blood-thinner therapy: None at this time Active Infection(s): Reviewed. None identified. Brianna Arnold is afebrile  Site Confirmation: Brianna Arnold was asked to confirm the procedure and laterality before marking the site Procedure checklist: Completed Consent: Before the procedure and under the influence of no sedative(s), amnesic(s), or anxiolytics, the patient was informed of the treatment options, risks and possible complications. To fulfill our ethical and legal obligations, as recommended by the American Medical Association's Code of Ethics, I have informed the patient of my clinical impression; the nature and purpose of the treatment or procedure; the risks, benefits, and possible complications of the intervention; the alternatives, including doing nothing; the risk(s) and benefit(s) of the alternative treatment(s) or procedure(s); and the risk(s) and benefit(s) of doing nothing. The patient was provided information about the general risks and possible complications associated with the procedure. These may include, but are not limited to: failure to achieve desired goals, infection, bleeding, organ or nerve damage, allergic reactions,  paralysis, and death. In addition, the patient was informed of those risks and complications associated to the procedure, such as failure to decrease pain;  infection; bleeding; organ or nerve damage with subsequent damage to sensory, motor, and/or autonomic systems, resulting in permanent pain, numbness, and/or weakness of one or several areas of the body; allergic reactions; (i.e.: anaphylactic reaction); and/or death. Furthermore, the patient was informed of those risks and complications associated with the medications. These include, but are not limited to: allergic reactions (i.e.: anaphylactic or anaphylactoid reaction(s)); adrenal axis suppression; blood sugar elevation that in diabetics may result in ketoacidosis or comma; water retention that in patients with history of congestive heart failure may result in shortness of breath, pulmonary edema, and decompensation with resultant heart failure; weight gain; swelling or edema; medication-induced neural toxicity; particulate matter embolism and blood vessel occlusion with resultant organ, and/or nervous system infarction; and/or aseptic necrosis of one or more joints. Finally, the patient was informed that Medicine is not an exact science; therefore, there is also the possibility of unforeseen or unpredictable risks and/or possible complications that may result in a catastrophic outcome. The patient indicated having understood very clearly. We have given the patient no guarantees and we have made no promises. Enough time was given to the patient to ask questions, all of which were answered to the patient's satisfaction. Brianna Arnold has indicated that she wanted to continue with the procedure. Attestation: I, the ordering provider, attest that I have discussed with the patient the benefits, risks, side-effects, alternatives, likelihood of achieving goals, and potential problems during recovery for the procedure that I have provided informed consent. Date   Time: 09/19/2022 10:40 AM  Pre-Procedure Preparation:  Monitoring: As per clinic protocol. Respiration, ETCO2, SpO2, BP, heart rate and rhythm monitor placed and checked for adequate function Safety Precautions: Patient was assessed for positional comfort and pressure points before starting the procedure. Time-out: I initiated and conducted the "Time-out" before starting the procedure, as per protocol. The patient was asked to participate by confirming the accuracy of the "Time Out" information. Verification of the correct person, site, and procedure were performed and confirmed by me, the nursing staff, and the patient. "Time-out" conducted as per Joint Commission's Universal Protocol (UP.01.01.01). Time: 1120  Description of Procedure:          Area Prepped: Entire Lower Lumbosacral Region & Right cervical region DuraPrep (Iodine Povacrylex [0.7% available iodine] and Isopropyl Alcohol, 74% w/w) Safety Precautions: Aspiration looking for blood return was conducted prior to all injections. At no point did we inject any substances, as a needle was being advanced. No attempts were made at seeking any paresthesias. Safe injection practices and needle disposal techniques used. Medications properly checked for expiration dates. SDV (single dose vial) medications used. Description of the Procedure: Protocol guidelines were followed. The patient was placed in position over the fluoroscopy table. The target area was identified and the area prepped in the usual manner. Skin & deeper tissues infiltrated with local anesthetic. Appropriate amount of time allowed to pass for local anesthetics to take effect. The procedure needles were then advanced to the target area. Proper needle placement secured. Negative aspiration confirmed. Solution injected in intermittent fashion, asking for systemic symptoms every 0.5cc of injectate. The needles were then removed and the area cleansed, making sure to leave some of the  prepping solution back to take advantage of its long term bactericidal properties.  Vitals:   09/19/22 1101  BP: 118/80  Pulse: 87  Resp: 18  Temp: 98.2 F (36.8 C)  TempSrc: Temporal  SpO2: 100%  Weight: 140 lb (63.5 kg)  Height: 4\' 7"  (1.397 m)  Start Time: 1120 hrs. End Time: 1145 (MD had to leave the room for assessment of another patient.  TPI procedure halted til BL can return.) hrs. Materials:  Needle(s) Type: Regular needle Gauge: 27G Length: 1.5-in  Medication(s): Please see orders for medications and dosing details.   15 trigger points were also injected in the right and left cervical, right and left trapezius, & lumbar paraspinal region with dry needling performed.  Each trigger point was injected with 0.5 to 1 cc of 0.2% ropivacaine.   Post-operative Assessment:  Post-procedure Vital Signs:  Pulse/HCG Rate: 87  Temp: 98.2 F (36.8 C) Resp: 18 BP: 118/80 SpO2: 100 %  EBL: None  Complications: No immediate post-treatment complications observed by team, or reported by patient.  Note: The patient tolerated the entire procedure well. A repeat set of vitals were taken after the procedure and the patient was kept under observation following institutional policy, for this type of procedure. Post-procedural neurological assessment was performed, showing return to baseline, prior to discharge. The patient was provided with post-procedure discharge instructions, including a section on how to identify potential problems. Should any problems arise concerning this procedure, the patient was given instructions to immediately contact us, at any time, without hesitation. In any case, we plan to contact the patient by telephone for a follow-up status report regarding this interventional procedure.  Comments:  No additional relevant information.  Plan of Care   Medications ordered for procedure: Meds ordered this encounter  Medications   ropivacaine (PF) 2 mg/mL  (0.2%) (NAROPIN) injection 9 mL   ropivacaine (PF) 2 mg/mL (0.2%) (NAROPIN) injection 9 mL    No orders of the defined types were placed in this encounter.    Medications administered: We administered ropivacaine (PF) 2 mg/mL (0.2%) and ropivacaine (PF) 2 mg/mL (0.2%).  See the medical record for exact dosing, route, and time of administration.  Follow-up plan:   Return in about 4 weeks (around 10/17/2022), or TPI.       Pharmacological management options:  Opioid Analgesics: Butrans patch, side effects at 7.5 mcg an hour, analgesic benefit with no side effects at 5 mcg an hour, continue  Membrane stabilizer: Continue gabapentin as prescribed  Muscle relaxant: Continue Flexeril as prescribed  NSAID: To be determined at a later time  Other analgesic(s): To be determined at a later time    Interventional management options: Ms. Rabun was informed that there is no guarantee that she would be a candidate for interventional therapies. The decision will be based on the results of diagnostic studies, as well as Ms. Colden's risk profile.  Procedure(s) hx Left axillary peripheral nerve stimulation: 12/30/19 providing significant pain relief.  Removed 02/29/2020              Recent Visits Date Type Provider Dept  08/28/22 Office Visit Edward Jolly, MD [facility]  08/08/22 Office Visit Edward Jolly, MD [facility]  07/11/22 Procedure visit Edward Jolly, MD [facility]  Showing recent visits within past 90 days and meeting all other requirements Today's Visits Date Type Provider Dept  09/19/22 Procedure visit Edward Jolly, MD [facility]  Showing today's visits and meeting all other requirements Future Appointments Date Type Provider Dept  10/17/22 Appointment Edward Jolly, MD [facility]  Showing future appointments within next 90 days and meeting all other requirements  Disposition: Discharge home  Discharge (Date  Time):  09/19/2022; 1144 hrs.   Primary Care Physician: Marguarite Arbour, MD Location: [facility]  Outpatient Pain Management Facility Note by: Edward Jolly, MD Date: 09/19/2022; Time: 12:32 PM  Disclaimer:  Medicine is not an exact science. The only guarantee in medicine is that nothing is guaranteed. It is important to note that the decision to proceed with this intervention was based on the information collected from the patient. The Data and conclusions were drawn from the patient's questionnaire, the interview, and the physical examination. Because the information was provided in large part by the patient, it cannot be guaranteed that it has not been purposely or unconsciously manipulated. Every effort has been made to obtain as much relevant data as possible for this evaluation. It is important to note that the conclusions that lead to this procedure are derived in large part from the available data. Always take into account that the treatment will also be dependent on availability of resources and existing treatment guidelines, considered by other Pain Management Practitioners as being common knowledge and practice, at the time of the intervention. For Medico-Legal purposes, it is also important to point out that variation in procedural techniques and pharmacological choices are the acceptable norm. The indications, contraindications, technique, and results of the above procedure should only be interpreted and judged by a Board-Certified Interventional Pain Specialist with extensive familiarity and expertise in the same exact procedure and technique.

## 2022-09-19 NOTE — Progress Notes (Signed)
Safety precautions to be maintained throughout the outpatient stay will include: orient to surroundings, keep bed in low position, maintain call bell within reach at all times, provide assistance with transfer out of bed and ambulation.  

## 2022-09-25 DIAGNOSIS — M25552 Pain in left hip: Secondary | ICD-10-CM | POA: Diagnosis not present

## 2022-09-25 DIAGNOSIS — M25551 Pain in right hip: Secondary | ICD-10-CM | POA: Diagnosis not present

## 2022-09-30 DIAGNOSIS — Z981 Arthrodesis status: Secondary | ICD-10-CM | POA: Diagnosis not present

## 2022-09-30 DIAGNOSIS — G894 Chronic pain syndrome: Secondary | ICD-10-CM | POA: Diagnosis not present

## 2022-09-30 DIAGNOSIS — M4105 Infantile idiopathic scoliosis, thoracolumbar region: Secondary | ICD-10-CM | POA: Diagnosis not present

## 2022-10-05 DIAGNOSIS — R8290 Unspecified abnormal findings in urine: Secondary | ICD-10-CM | POA: Diagnosis not present

## 2022-10-05 DIAGNOSIS — E538 Deficiency of other specified B group vitamins: Secondary | ICD-10-CM | POA: Diagnosis not present

## 2022-10-05 DIAGNOSIS — Z79899 Other long term (current) drug therapy: Secondary | ICD-10-CM | POA: Diagnosis not present

## 2022-10-05 DIAGNOSIS — Z3042 Encounter for surveillance of injectable contraceptive: Secondary | ICD-10-CM | POA: Diagnosis not present

## 2022-10-05 DIAGNOSIS — I10 Essential (primary) hypertension: Secondary | ICD-10-CM | POA: Diagnosis not present

## 2022-10-05 DIAGNOSIS — E559 Vitamin D deficiency, unspecified: Secondary | ICD-10-CM | POA: Diagnosis not present

## 2022-10-10 DIAGNOSIS — M25551 Pain in right hip: Secondary | ICD-10-CM | POA: Diagnosis not present

## 2022-10-10 DIAGNOSIS — M25552 Pain in left hip: Secondary | ICD-10-CM | POA: Diagnosis not present

## 2022-10-15 DIAGNOSIS — M25551 Pain in right hip: Secondary | ICD-10-CM | POA: Diagnosis not present

## 2022-10-15 DIAGNOSIS — M25552 Pain in left hip: Secondary | ICD-10-CM | POA: Diagnosis not present

## 2022-10-17 ENCOUNTER — Encounter: Payer: Self-pay | Admitting: Student in an Organized Health Care Education/Training Program

## 2022-10-17 ENCOUNTER — Ambulatory Visit
Payer: Medicare HMO | Attending: Student in an Organized Health Care Education/Training Program | Admitting: Student in an Organized Health Care Education/Training Program

## 2022-10-17 VITALS — BP 149/107 | HR 92 | Temp 98.2°F | Resp 16 | Ht <= 58 in | Wt 150.0 lb

## 2022-10-17 DIAGNOSIS — M7918 Myalgia, other site: Secondary | ICD-10-CM | POA: Insufficient documentation

## 2022-10-17 DIAGNOSIS — M4105 Infantile idiopathic scoliosis, thoracolumbar region: Secondary | ICD-10-CM | POA: Diagnosis not present

## 2022-10-17 MED ORDER — DIAZEPAM 5 MG PO TABS
ORAL_TABLET | ORAL | Status: AC
  Filled 2022-10-17: qty 1

## 2022-10-17 MED ORDER — ROPIVACAINE HCL 2 MG/ML IJ SOLN
INTRAMUSCULAR | Status: AC
  Filled 2022-10-17: qty 20

## 2022-10-17 MED ORDER — ROPIVACAINE HCL 2 MG/ML IJ SOLN: 9.0000 mL | Freq: Once | INTRAMUSCULAR | Status: DC

## 2022-10-17 MED ORDER — ROPIVACAINE HCL 2 MG/ML IJ SOLN
9.0000 mL | Freq: Once | INTRAMUSCULAR | Status: AC
  Administered 2022-10-17: 18 mL via PERINEURAL

## 2022-10-17 MED ORDER — DIAZEPAM 5 MG PO TABS
5.0000 mg | ORAL_TABLET | ORAL | Status: AC
  Administered 2022-10-17: 5 mg via ORAL

## 2022-10-17 NOTE — Progress Notes (Signed)
PROVIDER NOTE: Information contained herein reflects review and annotations entered in association with encounter. Interpretation of such information and data should be left to medically-trained personnel. Information provided to patient can be located elsewhere in the medical record under "Patient Instructions". Document created using STT-dictation technology, any transcriptional errors that may result from process are unintentional.    Patient: Brianna Arnold  Service Category: Procedure  Provider: Edward Jolly, MD  DOB: 11-Jul-1990  DOS: 10/17/2022  Location: [facility]  MRN: [id number]  Setting: Ambulatory - outpatient  Referring Provider: Marguarite Arbour, MD  Type: Established Patient  Specialty: Interventional Pain Management  PCP: Marguarite Arbour, MD   Primary Reason for Visit: Interventional Pain Management Treatment. CC: Back Pain (upper)   Procedure:          Anesthesia, Analgesia, Anxiolysis:  Type: Lumbar Paraspinal Muscle Trigger Point Injection (3+)         Quadratus lumborum, erector spinae, multifidus and periscapular region and bilateral cervical/trapezius TPI CPT: 20553 Primary Purpose: Therapeutic Approach: Percutaneous, ipsilateral approach. Laterality: Midline        Type: Local Anesthesia   Position: Prone   Indications: 1. Myofascial pain syndrome of lumbar spine   2. Infantile idiopathic scoliosis of thoracolumbar region     Pain Score: Pre-procedure: 7 /10 Post-procedure: 3 /10   Pre-op H&P Assessment:  Brianna Arnold is a 32 y.o. (year old), female patient, seen today for interventional treatment. She  has a past surgical history that includes Cervical spine surgery (07/2014); Back surgery (10/2014); and Anterior cervical decomp/discectomy fusion (N/A, 02/11/2019). Brianna Arnold has a current medication list which includes the following prescription(s): albuterol, baclofen, buprenorphine, vitamin d3, cyclobenzaprine, dicyclomine,  ergocalciferol, famotidine, ferrous sulfate, gabapentin, medroxyprogesterone, meloxicam, methocarbamol, ofloxacin, promethazine, propranolol er, tramadol, venlafaxine xr, vitamin b-12, and zolpidem, and the following Facility-Administered Medications: ropivacaine (pf) 2 mg/ml (0.2%). Her primarily concern today is the Back Pain (upper)   Initial Vital Signs:  Pulse/HCG Rate: 96ECG Heart Rate: 100 Temp: 98.2 F (36.8 C) Resp: 16 BP: 127/87 SpO2: 99 %  BMI: Estimated body mass index is 34.86 kg/m as calculated from the following:   Height as of this encounter: 4\' 7"  (1.397 m).   Weight as of this encounter: 150 lb (68 kg).  Risk Assessment: Allergies: Reviewed. She is allergic to hypafix [wound dressings], ativan [lorazepam], and tizanidine.  Allergy Precautions: None required Coagulopathies: Reviewed. None identified.  Blood-thinner therapy: None at this time Active Infection(s): Reviewed. None identified. Brianna Arnold is afebrile  Site Confirmation: Brianna Arnold was asked to confirm the procedure and laterality before marking the site Procedure checklist: Completed Consent: Before the procedure and under the influence of no sedative(s), amnesic(s), or anxiolytics, the patient was informed of the treatment options, risks and possible complications. To fulfill our ethical and legal obligations, as recommended by the American Medical Association's Code of Ethics, I have informed the patient of my clinical impression; the nature and purpose of the treatment or procedure; the risks, benefits, and possible complications of the intervention; the alternatives, including doing nothing; the risk(s) and benefit(s) of the alternative treatment(s) or procedure(s); and the risk(s) and benefit(s) of doing nothing. The patient was provided information about the general risks and possible complications associated with the procedure. These may include, but are not limited to: failure to achieve desired goals,  infection, bleeding, organ or nerve damage, allergic reactions, paralysis, and death. In addition, the patient was informed of those risks and complications associated to the procedure,  such as failure to decrease pain; infection; bleeding; organ or nerve damage with subsequent damage to sensory, motor, and/or autonomic systems, resulting in permanent pain, numbness, and/or weakness of one or several areas of the body; allergic reactions; (i.e.: anaphylactic reaction); and/or death. Furthermore, the patient was informed of those risks and complications associated with the medications. These include, but are not limited to: allergic reactions (i.e.: anaphylactic or anaphylactoid reaction(s)); adrenal axis suppression; blood sugar elevation that in diabetics may result in ketoacidosis or comma; water retention that in patients with history of congestive heart failure may result in shortness of breath, pulmonary edema, and decompensation with resultant heart failure; weight gain; swelling or edema; medication-induced neural toxicity; particulate matter embolism and blood vessel occlusion with resultant organ, and/or nervous system infarction; and/or aseptic necrosis of one or more joints. Finally, the patient was informed that Medicine is not an exact science; therefore, there is also the possibility of unforeseen or unpredictable risks and/or possible complications that may result in a catastrophic outcome. The patient indicated having understood very clearly. We have given the patient no guarantees and we have made no promises. Enough time was given to the patient to ask questions, all of which were answered to the patient's satisfaction. Brianna Arnold has indicated that she wanted to continue with the procedure. Attestation: I, the ordering provider, attest that I have discussed with the patient the benefits, risks, side-effects, alternatives, likelihood of achieving goals, and potential problems during recovery  for the procedure that I have provided informed consent. Date  Time: 10/17/2022  1:55 PM  Pre-Procedure Preparation:  Monitoring: As per clinic protocol. Respiration, ETCO2, SpO2, BP, heart rate and rhythm monitor placed and checked for adequate function Safety Precautions: Patient was assessed for positional comfort and pressure points before starting the procedure. Time-out: I initiated and conducted the "Time-out" before starting the procedure, as per protocol. The patient was asked to participate by confirming the accuracy of the "Time Out" information. Verification of the correct person, site, and procedure were performed and confirmed by me, the nursing staff, and the patient. "Time-out" conducted as per Joint Commission's Universal Protocol (UP.01.01.01). Time: 1425  Description of Procedure:          Area Prepped: Entire Lower Lumbosacral Region & Right cervical region DuraPrep (Iodine Povacrylex [0.7% available iodine] and Isopropyl Alcohol, 74% w/w) Safety Precautions: Aspiration looking for blood return was conducted prior to all injections. At no point did we inject any substances, as a needle was being advanced. No attempts were made at seeking any paresthesias. Safe injection practices and needle disposal techniques used. Medications properly checked for expiration dates. SDV (single dose vial) medications used. Description of the Procedure: Protocol guidelines were followed. The patient was placed in position over the fluoroscopy table. The target area was identified and the area prepped in the usual manner. Skin & deeper tissues infiltrated with local anesthetic. Appropriate amount of time allowed to pass for local anesthetics to take effect. The procedure needles were then advanced to the target area. Proper needle placement secured. Negative aspiration confirmed. Solution injected in intermittent fashion, asking for systemic symptoms every 0.5cc of injectate. The needles were then  removed and the area cleansed, making sure to leave some of the prepping solution back to take advantage of its long term bactericidal properties.  Vitals:   10/17/22 1359 10/17/22 1423 10/17/22 1429  BP: 127/87 (!) 133/98 (!) 149/107  Pulse: 96 100 92  Resp: 16 18 16   Temp: 98.2 F (36.8  C)    TempSrc: Temporal    SpO2: 99% 99% 99%  Weight: 150 lb (68 kg)    Height: 4\' 7"  (1.397 m)         Start Time: 1425 hrs. End Time:   hrs. Materials:  Needle(s) Type: Regular needle Gauge: 27G Length: 1.5-in  Medication(s): Please see orders for medications and dosing details.   15 trigger points were also injected in the right and left cervical, right and left trapezius, & lumbar paraspinal region with dry needling performed.  Each trigger point was injected with 0.5 to 1 cc of 0.2% ropivacaine.   Post-operative Assessment:  Post-procedure Vital Signs:  Pulse/HCG Rate: 9298 Temp: 98.2 F (36.8 C) Resp: 16 BP: (!) 149/107 SpO2: 99 %  EBL: None  Complications: No immediate post-treatment complications observed by team, or reported by patient.  Note: The patient tolerated the entire procedure well. A repeat set of vitals were taken after the procedure and the patient was kept under observation following institutional policy, for this type of procedure. Post-procedural neurological assessment was performed, showing return to baseline, prior to discharge. The patient was provided with post-procedure discharge instructions, including a section on how to identify potential problems. Should any problems arise concerning this procedure, the patient was given instructions to immediately contact us, at any time, without hesitation. In any case, we plan to contact the patient by telephone for a follow-up status report regarding this interventional procedure.  Comments:  No additional relevant information.  Plan of Care   Medications ordered for procedure: Meds ordered this encounter   Medications   ropivacaine (PF) 2 mg/mL (0.2%) (NAROPIN) injection 9 mL   ropivacaine (PF) 2 mg/mL (0.2%) (NAROPIN) injection 9 mL   diazepam (VALIUM) tablet 5 mg    Make sure Flumazenil is available in the pyxis when using this medication. If oversedation occurs, administer 0.2 mg IV over 15 sec. If after 45 sec no response, administer 0.2 mg again over 1 min; may repeat at 1 min intervals; not to exceed 4 doses (1 mg)    No orders of the defined types were placed in this encounter.    Medications administered: We administered ropivacaine (PF) 2 mg/mL (0.2%) and diazepam.  See the medical record for exact dosing, route, and time of administration.  Follow-up plan:   Return for patient will call to schedule F2F appt prn.       Pharmacological management options:  Opioid Analgesics: Butrans patch, side effects at 7.5 mcg an hour, analgesic benefit with no side effects at 5 mcg an hour, continue  Membrane stabilizer: Continue gabapentin as prescribed  Muscle relaxant: Continue Flexeril as prescribed  NSAID: To be determined at a later time  Other analgesic(s): To be determined at a later time    Interventional management options: Brianna Arnold was informed that there is no guarantee that she would be a candidate for interventional therapies. The decision will be based on the results of diagnostic studies, as well as Brianna Arnold's risk profile.  Procedure(s) hx Left axillary peripheral nerve stimulation: 12/30/19 providing significant pain relief.  Removed 02/29/2020              Recent Visits Date Type Provider Dept  09/19/22 Procedure visit Edward Jolly, MD [facility]  08/28/22 Office Visit Edward Jolly, MD [facility]  08/08/22 Office Visit Edward Jolly, MD [facility]  Showing recent visits within past 90 days and meeting all other requirements Today's Visits Date Type Provider  Dept  10/17/22 Procedure visit Edward Jolly, MD [facility]   Showing today's visits and meeting all other requirements Future Appointments Date Type Provider Dept  11/14/22 Appointment Edward Jolly, MD [facility]  Showing future appointments within next 90 days and meeting all other requirements  Disposition: Discharge home  Discharge (Date  Time): 10/17/2022;   hrs.   Primary Care Physician: Marguarite Arbour, MD Location: [facility] Note by: Edward Jolly, MD Date: 10/17/2022; Time: 3:07 PM  Disclaimer:  Medicine is not an exact science. The only guarantee in medicine is that nothing is guaranteed. It is important to note that the decision to proceed with this intervention was based on the information collected from the patient. The Data and conclusions were drawn from the patient's questionnaire, the interview, and the physical examination. Because the information was provided in large part by the patient, it cannot be guaranteed that it has not been purposely or unconsciously manipulated. Every effort has been made to obtain as much relevant data as possible for this evaluation. It is important to note that the conclusions that lead to this procedure are derived in large part from the available data. Always take into account that the treatment will also be dependent on availability of resources and existing treatment guidelines, considered by other Pain Management Practitioners as being common knowledge and practice, at the time of the intervention. For Medico-Legal purposes, it is also important to point out that variation in procedural techniques and pharmacological choices are the acceptable norm. The indications, contraindications, technique, and results of the above procedure should only be interpreted and judged by a Board-Certified Interventional Pain Specialist with extensive familiarity and expertise in the same exact procedure and technique.

## 2022-10-17 NOTE — Patient Instructions (Addendum)
Pain Management Discharge Instructions  General Discharge Instructions :  If you need to reach your doctor call: Monday-Friday 8:00 am - 4:00 pm at [phone] or toll free [phone].  After clinic hours [phone] to have operator reach doctor.  Bring all of your medication bottles to all your appointments in the pain clinic.  To cancel or reschedule your appointment with Pain Management please remember to call 24 hours in advance to avoid a fee.  Refer to the educational materials which you have been given on: General Risks, I had my Procedure. Discharge Instructions, Post Sedation.  Post Procedure Instructions:  The drugs you were given will stay in your system until tomorrow, so for the next 24 hours you should not drive, make any legal decisions or drink any alcoholic beverages.  You may eat anything you prefer, but it is better to start with liquids then soups and crackers, and gradually work up to solid foods.  Please notify your doctor immediately if you have any unusual bleeding, trouble breathing or pain that is not related to your normal pain.  Depending on the type of procedure that was done, some parts of your body may feel week and/or numb.  This usually clears up by tonight or the next day.  Walk with the use of an assistive device or accompanied by an adult for the 24 hours.  You may use ice on the affected area for the first 24 hours.  Put ice in a Ziploc bag and cover with a towel and place against area 15 minutes on 15 minutes off.  You may switch to heat after 24 hours.Trigger Point Injection Trigger points are areas where you have pain. A trigger point injection is a shot given in the trigger point to help relieve pain for a few days to a few months. Common places for trigger points include the neck, shoulders, upper back, or lower back. A trigger point injection will not cure long-term (chronic) pain permanently. These injections do not always work for every  person. For some people, they can help to relieve pain for a few days to a few months. Tell a health care provider about: Any allergies you have. All medicines you are taking, including vitamins, herbs, eye drops, creams, and over-the-counter medicines. Any problems you or family members have had with anesthetic medicines. Any bleeding problems you have. Any surgeries you have had. Any medical conditions you have. Whether you are pregnant or may be pregnant. What are the risks? Generally, this is a safe procedure. However, problems may occur, including: Infection. Bleeding or bruising. Allergic reaction to the injected medicine. Irritation of the skin around the injection site. What happens before the procedure? Ask your health care provider about: Changing or stopping your regular medicines. This is especially important if you are taking diabetes medicines or blood thinners. Taking medicines such as aspirin and ibuprofen. These medicines can thin your blood. Do not take these medicines unless your health care provider tells you to take them. Taking over-the-counter medicines, vitamins, herbs, and supplements. What happens during the procedure?  Your health care provider will feel for trigger points. A marker may be used to circle the area for the injection. The skin over the trigger point will be washed with a germ-killing soap. You may be given a medicine to help you relax (sedative). A thin needle is used for the injection. You may feel pain or a twitching feeling when the needle enters your skin. A numbing solution may be injected  into the trigger point. Sometimes a medicine to keep down inflammation is also injected. Your health care provider may move the needle around the area where the trigger point is located until the tightness and twitching goes away. After the injection, your health care provider may put gentle pressure over the injection site. The injection site will be  covered with a bandage (dressing). The procedure may vary among health care providers and hospitals. What can I expect after treatment? After treatment, you may have soreness and stiffness for 1-2 days. Follow these instructions at home: Injection site care Remove your dressing in a few hours, or as told by your health care provider. Check your injection site every day for signs of infection. Check for: Redness, swelling, or pain. Fluid or blood. Warmth. Pus or a bad smell. Managing pain, stiffness, and swelling If directed, put ice on the affected area. To do this: Put ice in a plastic bag. Place a towel between your skin and the bag. Leave the ice on for 20 minutes, 2-3 times a day. Remove the ice if your skin turns bright red. This is very important. If you cannot feel pain, heat, or cold, you have a greater risk of damage to the area. Activity If you were given a sedative during the procedure, it can affect you for several hours. Do not drive or operate machinery until your health care provider says that it is safe. Do not take baths, swim, or use a hot tub until your health care provider approves. Return to your normal activities as told by your health care provider. Ask your health care provider what activities are safe for you. General instructions If you were asked to stop your regular medicines, ask your health care provider when you may start taking them again. You may be asked to see an occupational or physical therapist for exercises that reduce muscle strain and stretch the area of the trigger point. Keep all follow-up visits. This is important. Contact a health care provider if: Your pain comes back, and it is worse than before the injection. You may need more injections. You have chills or a fever. The injection site becomes more painful, red, swollen, or warm to the touch. Summary A trigger point injection is a shot given in the trigger point to help relieve  pain. Common places for trigger point injections are the neck, shoulders, upper back, and lower back. These injections do not always work for every person, but for some people, the injections can help to relieve pain for a few days to a few months. Contact a health care provider if symptoms come back or if they are worse than before treatment. Also, get help if the injection site becomes more painful, red, swollen, or warm to the touch. This information is not intended to replace advice given to you by your health care provider. Make sure you discuss any questions you have with your health care provider. Document Revised: 07/19/2020 Document Reviewed: 07/19/2020 Elsevier Patient Education  2024 [employer].

## 2022-10-18 ENCOUNTER — Telehealth: Payer: Self-pay | Admitting: *Deleted

## 2022-10-18 ENCOUNTER — Telehealth: Payer: Self-pay

## 2022-10-18 NOTE — Telephone Encounter (Signed)
Post procedure follow up.  LM 

## 2022-10-18 NOTE — Telephone Encounter (Signed)
No problems post procedure. 

## 2022-10-21 ENCOUNTER — Encounter: Payer: Self-pay | Admitting: Student in an Organized Health Care Education/Training Program

## 2022-10-29 DIAGNOSIS — M25552 Pain in left hip: Secondary | ICD-10-CM | POA: Diagnosis not present

## 2022-10-29 DIAGNOSIS — M25551 Pain in right hip: Secondary | ICD-10-CM | POA: Diagnosis not present

## 2022-10-30 DIAGNOSIS — Z981 Arthrodesis status: Secondary | ICD-10-CM | POA: Diagnosis not present

## 2022-10-30 DIAGNOSIS — G894 Chronic pain syndrome: Secondary | ICD-10-CM | POA: Diagnosis not present

## 2022-10-30 DIAGNOSIS — M4105 Infantile idiopathic scoliosis, thoracolumbar region: Secondary | ICD-10-CM | POA: Diagnosis not present

## 2022-10-30 IMAGING — MR MR CERVICAL SPINE W/O CM
5 series · 38 of 48 positions shown · non-contrast
Comparison: MRI of the cervical spine December 30, 1998. CT of the
cervical December 05, 18

CLINICAL DATA: Status post cervical spinal fusion; Spondylocostal
dysplasia; chronic pain syndrome; cervical myelopathy.

EXAM:
MRI CERVICAL SPINE WITHOUT CONTRAST
TECHNIQUE: Multiplanar, multisequence MR imaging of the cervical spine was
performed. No intravenous contrast was administered.

[Series 9: T2 · sagittal · 3.0mm · 0.62mm/px · 8 of 17 slices shown (1 of 2)]
[im 1/17]
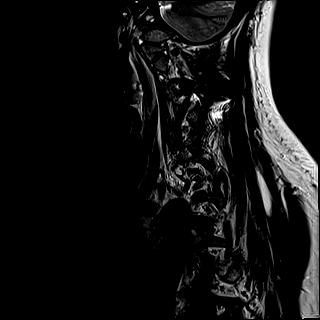
[im 3/17]
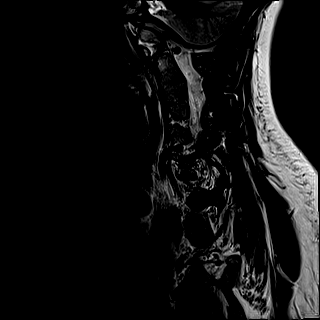
[im 5/17]
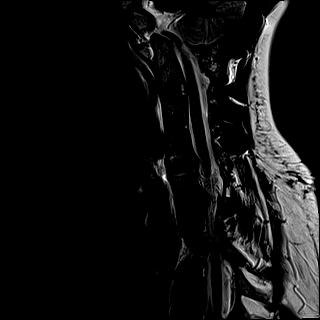
[im 7/17]
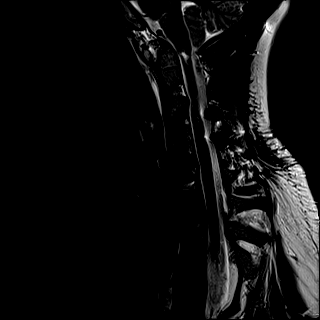
[im 10/17]
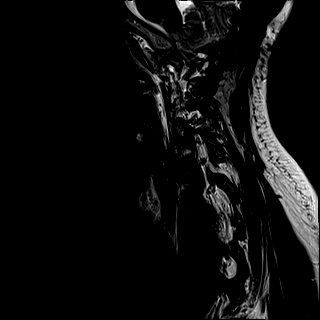
[im 12/17]
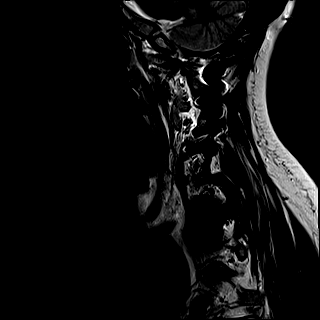
[im 14/17]
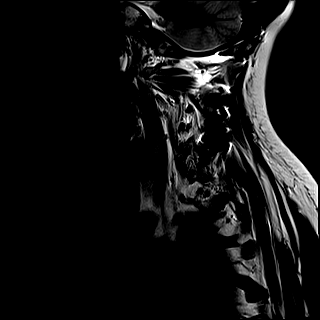
[im 17/17]
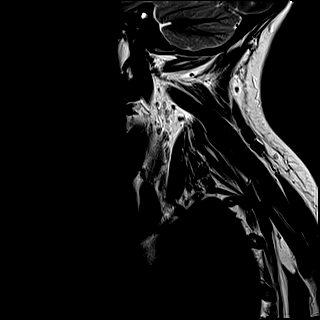

[Series 10: FLAIR · sagittal · 3.0mm · 0.78mm/px · 7 of 17 slices shown]
[im 1/17]
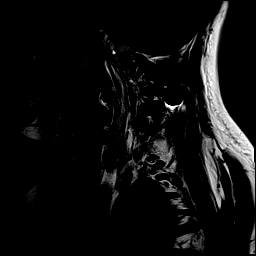
[im 3/17]
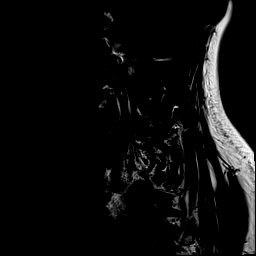
[im 6/17]
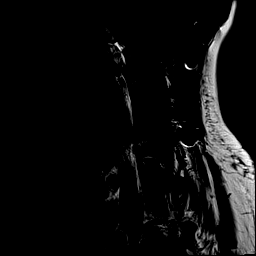
[im 9/17]
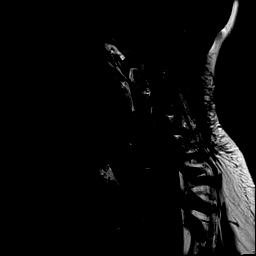
[im 11/17]
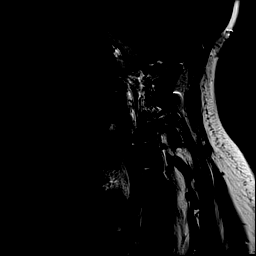
[im 14/17]
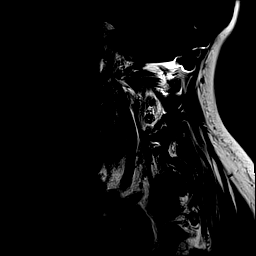
[im 17/17]
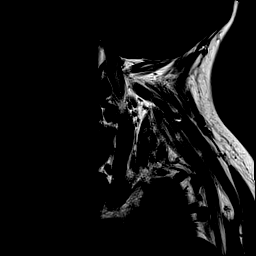

[Series 11: STIR · sagittal · 3.0mm · 0.62mm/px · 7 of 17 slices shown]
[im 1/17]
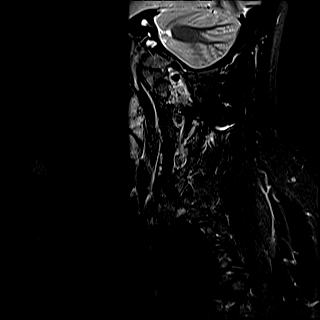
[im 3/17]
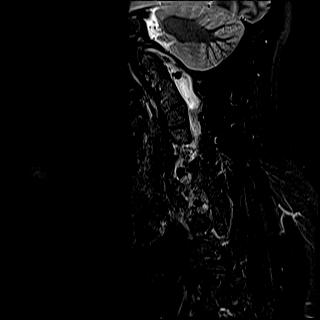
[im 6/17]
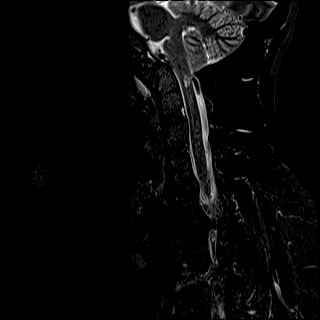
[im 9/17]
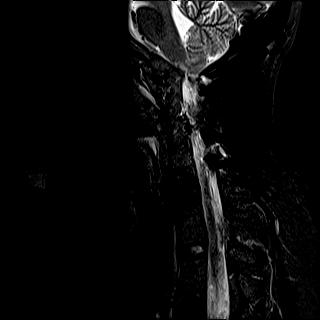
[im 11/17]
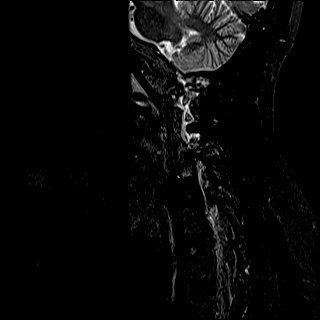
[im 14/17]
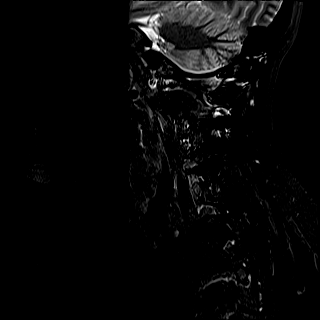
[im 17/17]
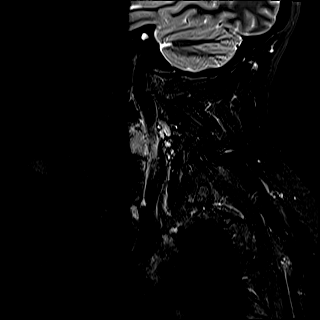

[Series 12: T2 · axial · 3.0mm · 0.70mm/px · z∈[-111,-12]mm · 9 of 30 slices shown (2 of 2)]
[im 1/30]
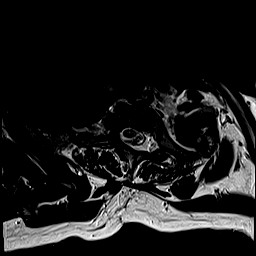
[im 5/30]
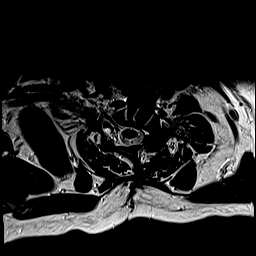
[im 10/30]
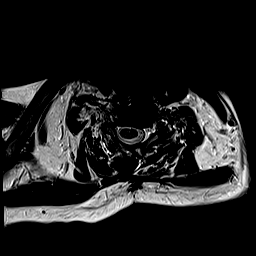
[im 13/30]
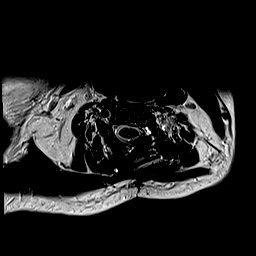
[im 15/30]
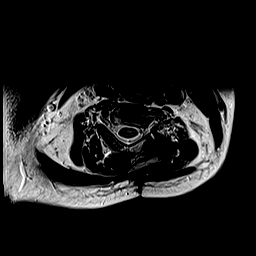
[im 17/30]
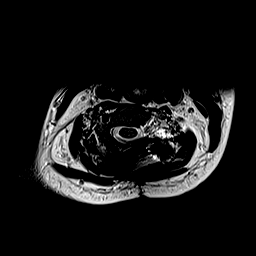
[im 20/30]
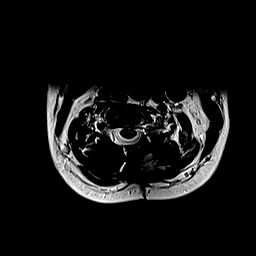
[im 25/30]
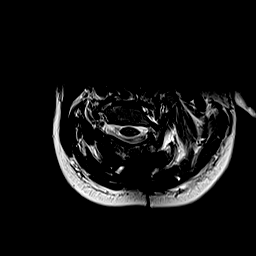
[im 30/30]
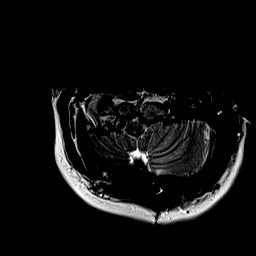

[Series 13: ax mpgr · axial · 3.0mm · 0.35mm/px · z∈[-111,-29]mm · 7 of 30 slices shown]
[im 1/30]
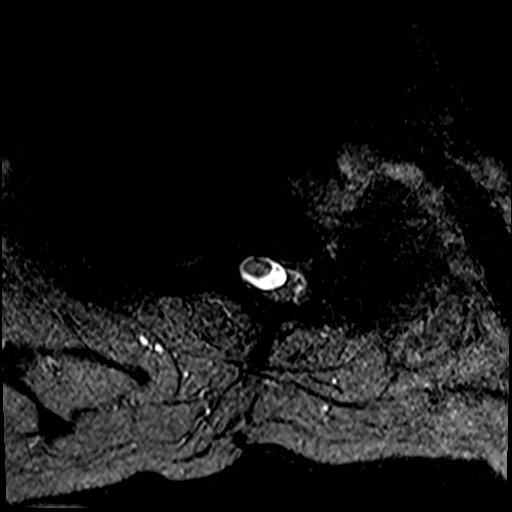
[im 5/30]
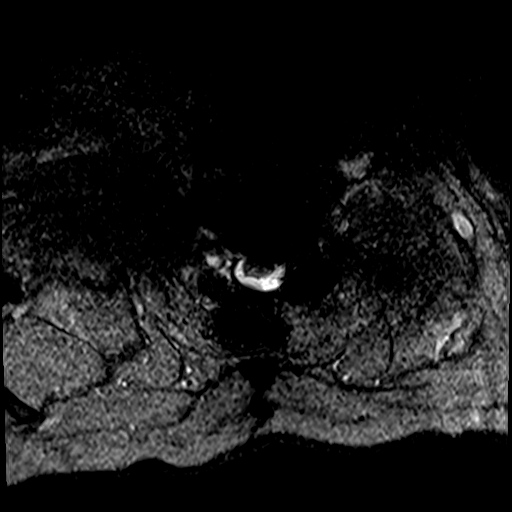
[im 10/30]
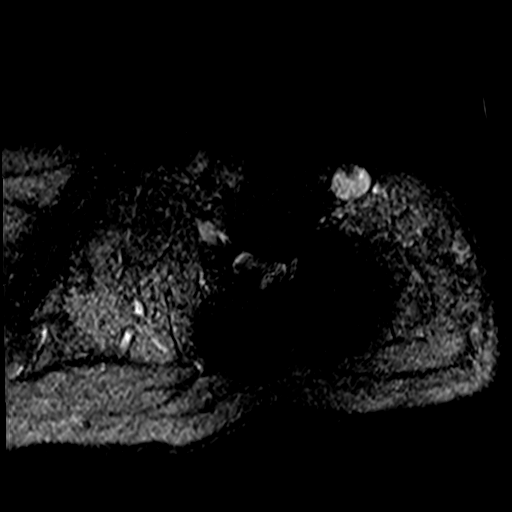
[im 13/30]
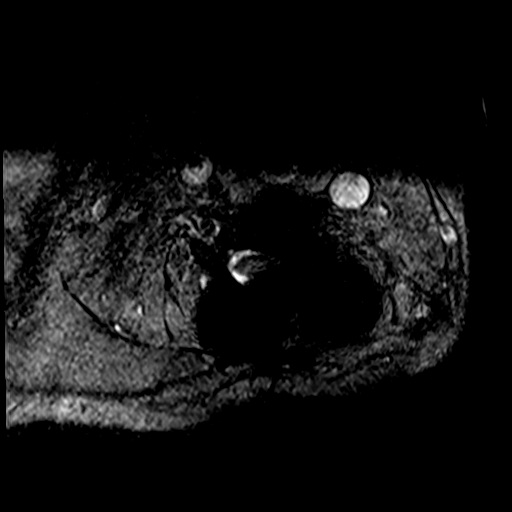
[im 17/30]
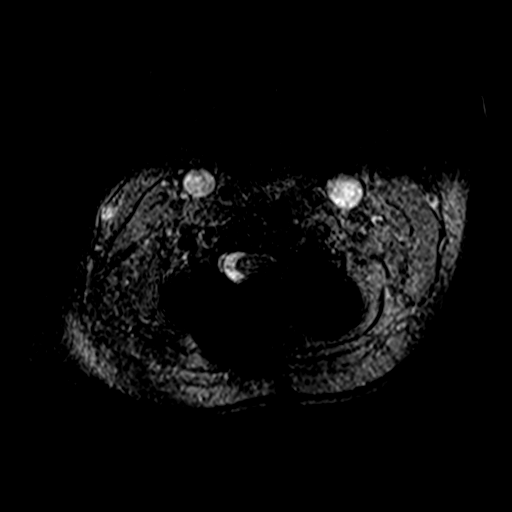
[im 20/30]
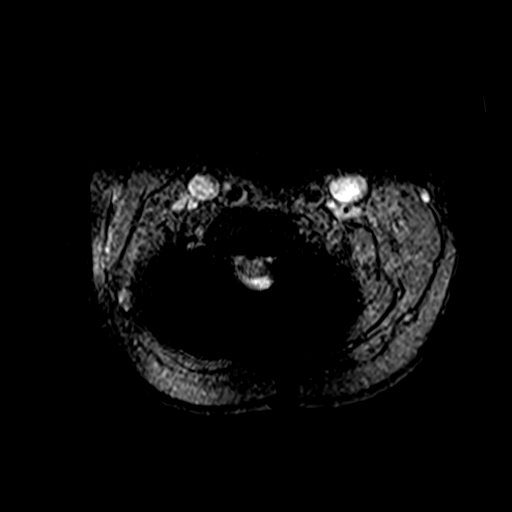
[im 25/30]
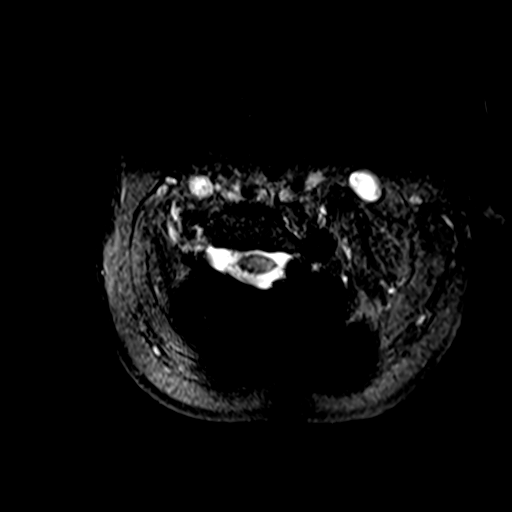

[38 of 48 positions shown; findings below may reference images not displayed]

FINDINGS: Alignment: Mild reversal of the cervical curvature

Vertebrae: Extensive segmentation anomaly, numbering may not be
accurate. No fracture, evidence of discitis, or aggressive bone
lesion. Posterior craniocervical fusion extending to the C6 level,
interbody fusion at C3-4 and C4-5, and anterior spinal fusion at
C6-7. Fusion of the left occipital condyle with the lateral mass of
C1 and odontoid process with posteriorly projecting bony prominence
resulting in moderate narrowing of the spinal canal at this level.

Cord: Focus of T2 hyperintensity within the left side of the
cervicomedullary junction, consistent with myelomalacia, unchanged.

Posterior Fossa, vertebral arteries, paraspinal tissues: Likely
hypoplastic left vertebral artery.

Disc levels:

No significant spinal canal or neural foraminal stenosis at any
cervical level.
IMPRESSION: 1. Multiple segmentation anomalies as described on multiple prior
studies status post posterior craniocervical fusion, interbody
fusion at C3-4 and C4-5 and anterior spinal fusion at C6-7.
2. Stable focus of myelomalacia at the cervicomedullary junction the
left.
3. No significant spinal canal or neural foraminal stenosis.

## 2022-11-14 ENCOUNTER — Ambulatory Visit: Payer: Medicare HMO | Admitting: Student in an Organized Health Care Education/Training Program

## 2022-11-14 ENCOUNTER — Encounter: Payer: Self-pay | Admitting: Student in an Organized Health Care Education/Training Program

## 2022-11-14 DIAGNOSIS — M25552 Pain in left hip: Secondary | ICD-10-CM | POA: Diagnosis not present

## 2022-11-14 DIAGNOSIS — M25551 Pain in right hip: Secondary | ICD-10-CM | POA: Diagnosis not present

## 2022-11-15 ENCOUNTER — Encounter: Payer: Self-pay | Admitting: *Deleted

## 2022-11-21 ENCOUNTER — Ambulatory Visit
Payer: Medicare HMO | Attending: Student in an Organized Health Care Education/Training Program | Admitting: Student in an Organized Health Care Education/Training Program

## 2022-11-21 ENCOUNTER — Encounter: Payer: Self-pay | Admitting: Student in an Organized Health Care Education/Training Program

## 2022-11-21 VITALS — BP 147/103 | HR 79 | Temp 99.0°F | Resp 17 | Ht <= 58 in | Wt 140.0 lb

## 2022-11-21 DIAGNOSIS — M542 Cervicalgia: Secondary | ICD-10-CM | POA: Insufficient documentation

## 2022-11-21 DIAGNOSIS — M7918 Myalgia, other site: Secondary | ICD-10-CM | POA: Insufficient documentation

## 2022-11-21 DIAGNOSIS — M4105 Infantile idiopathic scoliosis, thoracolumbar region: Secondary | ICD-10-CM | POA: Insufficient documentation

## 2022-11-21 MED ORDER — ROPIVACAINE HCL 2 MG/ML IJ SOLN
9.0000 mL | Freq: Once | INTRAMUSCULAR | Status: AC
  Administered 2022-11-21: 9 mL via PERINEURAL

## 2022-11-21 MED ORDER — DIAZEPAM 5 MG PO TABS
5.0000 mg | ORAL_TABLET | ORAL | Status: AC
  Administered 2022-11-21: 5 mg via ORAL

## 2022-11-21 MED ORDER — DIAZEPAM 5 MG PO TABS
ORAL_TABLET | ORAL | Status: AC
  Filled 2022-11-21: qty 1

## 2022-11-21 MED ORDER — ROPIVACAINE HCL 2 MG/ML IJ SOLN
INTRAMUSCULAR | Status: AC
  Filled 2022-11-21: qty 20

## 2022-11-21 NOTE — Progress Notes (Signed)
PROVIDER NOTE: Information contained herein reflects review and annotations entered in association with encounter. Interpretation of such information and data should be left to medically-trained personnel. Information provided to patient can be located elsewhere in the medical record under "Patient Instructions". Document created using STT-dictation technology, any transcriptional errors that may result from process are unintentional.    Patient: Brianna Arnold  Service Category: Procedure  Provider: Edward Jolly, MD  DOB: Apr 06, 1991  DOS: 11/21/2022  Location: [facility]  MRN: [id number]  Setting: Ambulatory - outpatient  Referring Provider: Marguarite Arbour, MD  Type: Established Patient  Specialty: Interventional Pain Management  PCP: Marguarite Arbour, MD   Primary Reason for Visit: Interventional Pain Management Treatment. CC: Back Pain   Procedure:          Anesthesia, Analgesia, Anxiolysis:  Type: Lumbar Paraspinal Muscle Trigger Point Injection (3+)         Quadratus lumborum, erector spinae, multifidus and periscapular region and bilateral cervical/trapezius TPI CPT: 20553 Primary Purpose: Therapeutic Approach: Percutaneous, ipsilateral approach. Laterality: Midline        Type: Local Anesthesia   Position: Prone   Indications: 1. Myofascial pain syndrome of lumbar spine   2. Infantile idiopathic scoliosis of thoracolumbar region   3. Cervicalgia      Pain Score: Pre-procedure: 7 /10 Post-procedure: 4 /10   Pre-op H&P Assessment:  Brianna Arnold is a 32 y.o. (year old), female patient, seen today for interventional treatment. She  has a past surgical history that includes Cervical spine surgery (07/2014); Back surgery (10/2014); and Anterior cervical decomp/discectomy fusion (N/A, 02/11/2019). Brianna Arnold has a current medication list which includes the following prescription(s): albuterol, baclofen, buprenorphine, vitamin d3, cyclobenzaprine,  dicyclomine, ergocalciferol, famotidine, ferrous sulfate, gabapentin, medroxyprogesterone, meloxicam, methocarbamol, ofloxacin, promethazine, propranolol er, tramadol, venlafaxine xr, vitamin b-12, and zolpidem. Her primarily concern today is the Back Pain   Initial Vital Signs:  Pulse/HCG Rate: 81  Temp: 99 F (37.2 C) Resp: 18 BP: (!) 140/82 SpO2: 100 %  BMI: Estimated body mass index is 31.39 kg/m as calculated from the following:   Height as of this encounter: 4\' 8"  (1.422 m).   Weight as of this encounter: 140 lb (63.5 kg).  Risk Assessment: Allergies: Reviewed. She is allergic to hypafix [wound dressings], ativan [lorazepam], and tizanidine.  Allergy Precautions: None required Coagulopathies: Reviewed. None identified.  Blood-thinner therapy: None at this time Active Infection(s): Reviewed. None identified. Brianna Arnold is afebrile  Site Confirmation: Brianna Arnold was asked to confirm the procedure and laterality before marking the site Procedure checklist: Completed Consent: Before the procedure and under the influence of no sedative(s), amnesic(s), or anxiolytics, the patient was informed of the treatment options, risks and possible complications. To fulfill our ethical and legal obligations, as recommended by the American Medical Association's Code of Ethics, I have informed the patient of my clinical impression; the nature and purpose of the treatment or procedure; the risks, benefits, and possible complications of the intervention; the alternatives, including doing nothing; the risk(s) and benefit(s) of the alternative treatment(s) or procedure(s); and the risk(s) and benefit(s) of doing nothing. The patient was provided information about the general risks and possible complications associated with the procedure. These may include, but are not limited to: failure to achieve desired goals, infection, bleeding, organ or nerve damage, allergic reactions, paralysis, and death. In  addition, the patient was informed of those risks and complications associated to the procedure, such as failure to decrease pain; infection; bleeding;  organ or nerve damage with subsequent damage to sensory, motor, and/or autonomic systems, resulting in permanent pain, numbness, and/or weakness of one or several areas of the body; allergic reactions; (i.e.: anaphylactic reaction); and/or death. Furthermore, the patient was informed of those risks and complications associated with the medications. These include, but are not limited to: allergic reactions (i.e.: anaphylactic or anaphylactoid reaction(s)); adrenal axis suppression; blood sugar elevation that in diabetics may result in ketoacidosis or comma; water retention that in patients with history of congestive heart failure may result in shortness of breath, pulmonary edema, and decompensation with resultant heart failure; weight gain; swelling or edema; medication-induced neural toxicity; particulate matter embolism and blood vessel occlusion with resultant organ, and/or nervous system infarction; and/or aseptic necrosis of one or more joints. Finally, the patient was informed that Medicine is not an exact science; therefore, there is also the possibility of unforeseen or unpredictable risks and/or possible complications that may result in a catastrophic outcome. The patient indicated having understood very clearly. We have given the patient no guarantees and we have made no promises. Enough time was given to the patient to ask questions, all of which were answered to the patient's satisfaction. Brianna Arnold has indicated that she wanted to continue with the procedure. Attestation: I, the ordering provider, attest that I have discussed with the patient the benefits, risks, side-effects, alternatives, likelihood of achieving goals, and potential problems during recovery for the procedure that I have provided informed consent. Date  Time: 11/21/2022  2:03  PM  Pre-Procedure Preparation:  Monitoring: As per clinic protocol. Respiration, ETCO2, SpO2, BP, heart rate and rhythm monitor placed and checked for adequate function Safety Precautions: Patient was assessed for positional comfort and pressure points before starting the procedure. Time-out: I initiated and conducted the "Time-out" before starting the procedure, as per protocol. The patient was asked to participate by confirming the accuracy of the "Time Out" information. Verification of the correct person, site, and procedure were performed and confirmed by me, the nursing staff, and the patient. "Time-out" conducted as per Joint Commission's Universal Protocol (UP.01.01.01). Time: 1425  Description of Procedure:          Area Prepped: Entire Lower Lumbosacral Region & Right cervical region DuraPrep (Iodine Povacrylex [0.7% available iodine] and Isopropyl Alcohol, 74% w/w) Safety Precautions: Aspiration looking for blood return was conducted prior to all injections. At no point did we inject any substances, as a needle was being advanced. No attempts were made at seeking any paresthesias. Safe injection practices and needle disposal techniques used. Medications properly checked for expiration dates. SDV (single dose vial) medications used. Description of the Procedure: Protocol guidelines were followed. The patient was placed in position over the fluoroscopy table. The target area was identified and the area prepped in the usual manner. Skin & deeper tissues infiltrated with local anesthetic. Appropriate amount of time allowed to pass for local anesthetics to take effect. The procedure needles were then advanced to the target area. Proper needle placement secured. Negative aspiration confirmed. Solution injected in intermittent fashion, asking for systemic symptoms every 0.5cc of injectate. The needles were then removed and the area cleansed, making sure to leave some of the prepping solution back to  take advantage of its long term bactericidal properties.  Vitals:   11/21/22 1406 11/21/22 1424 11/21/22 1432  BP: (!) 140/82 (!) 151/110 (!) 147/103  Pulse: 81 79 79  Resp:  18 17  Temp: 99 F (37.2 C)    SpO2: 100% 100%  100%  Weight: 140 lb (63.5 kg)    Height: 4\' 8"  (1.422 m)         Start Time: 1425 hrs. End Time: 1430 hrs. Materials:  Needle(s) Type: Regular needle Gauge: 27G Length: 1.5-in  Medication(s): Please see orders for medications and dosing details.   15 trigger points were also injected in the right and left cervical, right and left trapezius, & lumbar paraspinal region with dry needling performed.  Each trigger point was injected with 0.5 to 1 cc of 0.2% ropivacaine.   Post-operative Assessment:  Post-procedure Vital Signs:  Pulse/HCG Rate: 79  Temp: 99 F (37.2 C) Resp: 17 BP: (!) 147/103 SpO2: 100 %  EBL: None  Complications: No immediate post-treatment complications observed by team, or reported by patient.  Note: The patient tolerated the entire procedure well. A repeat set of vitals were taken after the procedure and the patient was kept under observation following institutional policy, for this type of procedure. Post-procedural neurological assessment was performed, showing return to baseline, prior to discharge. The patient was provided with post-procedure discharge instructions, including a section on how to identify potential problems. Should any problems arise concerning this procedure, the patient was given instructions to immediately contact us, at any time, without hesitation. In any case, we plan to contact the patient by telephone for a follow-up status report regarding this interventional procedure.  Comments:  No additional relevant information.  Plan of Care   Medications ordered for procedure: Meds ordered this encounter  Medications   ropivacaine (PF) 2 mg/mL (0.2%) (NAROPIN) injection 9 mL   ropivacaine (PF) 2 mg/mL (0.2%)  (NAROPIN) injection 9 mL   diazepam (VALIUM) tablet 5 mg    Make sure Flumazenil is available in the pyxis when using this medication. If oversedation occurs, administer 0.2 mg IV over 15 sec. If after 45 sec no response, administer 0.2 mg again over 1 min; may repeat at 1 min intervals; not to exceed 4 doses (1 mg)    No orders of the defined types were placed in this encounter.    Medications administered: We administered ropivacaine (PF) 2 mg/mL (0.2%), ropivacaine (PF) 2 mg/mL (0.2%), and diazepam.  See the medical record for exact dosing, route, and time of administration.  Follow-up plan:   No follow-ups on file.       Pharmacological management options:  Opioid Analgesics: Butrans patch, side effects at 7.5 mcg an hour, analgesic benefit with no side effects at 5 mcg an hour, continue  Membrane stabilizer: Continue gabapentin as prescribed  Muscle relaxant: Continue Flexeril as prescribed  NSAID: To be determined at a later time  Other analgesic(s): To be determined at a later time    Interventional management options: Ms. Kostas was informed that there is no guarantee that she would be a candidate for interventional therapies. The decision will be based on the results of diagnostic studies, as well as Ms. Maciver's risk profile.  Procedure(s) hx Left axillary peripheral nerve stimulation: 12/30/19 providing significant pain relief.  Removed 02/29/2020              Recent Visits Date Type Provider Dept  10/17/22 Procedure visit Edward Jolly, MD [facility]  09/19/22 Procedure visit Edward Jolly, MD [facility]  08/28/22 Office Visit Edward Jolly, MD [facility]  Showing recent visits within past 90 days and meeting all other requirements Today's Visits Date Type Provider Dept  11/21/22 Procedure visit Edward Jolly, MD [facility]  Showing  today's visits and meeting all other requirements Future Appointments No visits were found  meeting these conditions. Showing future appointments within next 90 days and meeting all other requirements  Disposition: Discharge home  Discharge (Date  Time): 11/21/2022; 1445 hrs.   Primary Care Physician: Marguarite Arbour, MD Location: [facility] Note by: Edward Jolly, MD Date: 11/21/2022; Time: 2:34 PM  Disclaimer:  Medicine is not an exact science. The only guarantee in medicine is that nothing is guaranteed. It is important to note that the decision to proceed with this intervention was based on the information collected from the patient. The Data and conclusions were drawn from the patient's questionnaire, the interview, and the physical examination. Because the information was provided in large part by the patient, it cannot be guaranteed that it has not been purposely or unconsciously manipulated. Every effort has been made to obtain as much relevant data as possible for this evaluation. It is important to note that the conclusions that lead to this procedure are derived in large part from the available data. Always take into account that the treatment will also be dependent on availability of resources and existing treatment guidelines, considered by other Pain Management Practitioners as being common knowledge and practice, at the time of the intervention. For Medico-Legal purposes, it is also important to point out that variation in procedural techniques and pharmacological choices are the acceptable norm. The indications, contraindications, technique, and results of the above procedure should only be interpreted and judged by a Board-Certified Interventional Pain Specialist with extensive familiarity and expertise in the same exact procedure and technique.

## 2022-11-21 NOTE — Patient Instructions (Signed)
Pain Management Discharge Instructions  General Discharge Instructions :  If you need to reach your doctor call: Monday-Friday 8:00 am - 4:00 pm at [phone] or toll free [phone].  After clinic hours [phone] to have operator reach doctor.  Bring all of your medication bottles to all your appointments in the pain clinic.  To cancel or reschedule your appointment with Pain Management please remember to call 24 hours in advance to avoid a fee.  Refer to the educational materials which you have been given on: General Risks, I had my Procedure. Discharge Instructions, Post Sedation.  Post Procedure Instructions:  The drugs you were given will stay in your system until tomorrow, so for the next 24 hours you should not drive, make any legal decisions or drink any alcoholic beverages.  You may eat anything you prefer, but it is better to start with liquids then soups and crackers, and gradually work up to solid foods.  Please notify your doctor immediately if you have any unusual bleeding, trouble breathing or pain that is not related to your normal pain.  Depending on the type of procedure that was done, some parts of your body may feel week and/or numb.  This usually clears up by tonight or the next day.  Walk with the use of an assistive device or accompanied by an adult for the 24 hours.  You may use ice on the affected area for the first 24 hours.  Put ice in a Ziploc bag and cover with a towel and place against area 15 minutes on 15 minutes off.  You may switch to heat after 24 hours.Trigger Point Injection Trigger points are areas where you have pain. A trigger point injection is a shot given in the trigger point to help relieve pain for a few days to a few months. Common places for trigger points include the neck, shoulders, upper back, or lower back. A trigger point injection will not cure long-term (chronic) pain permanently. These injections do not always work for every  person. For some people, they can help to relieve pain for a few days to a few months. Tell a health care provider about: Any allergies you have. All medicines you are taking, including vitamins, herbs, eye drops, creams, and over-the-counter medicines. Any problems you or family members have had with anesthetic medicines. Any bleeding problems you have. Any surgeries you have had. Any medical conditions you have. Whether you are pregnant or may be pregnant. What are the risks? Generally, this is a safe procedure. However, problems may occur, including: Infection. Bleeding or bruising. Allergic reaction to the injected medicine. Irritation of the skin around the injection site. What happens before the procedure? Ask your health care provider about: Changing or stopping your regular medicines. This is especially important if you are taking diabetes medicines or blood thinners. Taking medicines such as aspirin and ibuprofen. These medicines can thin your blood. Do not take these medicines unless your health care provider tells you to take them. Taking over-the-counter medicines, vitamins, herbs, and supplements. What happens during the procedure?  Your health care provider will feel for trigger points. A marker may be used to circle the area for the injection. The skin over the trigger point will be washed with a germ-killing soap. You may be given a medicine to help you relax (sedative). A thin needle is used for the injection. You may feel pain or a twitching feeling when the needle enters your skin. A numbing solution may be injected  into the trigger point. Sometimes a medicine to keep down inflammation is also injected. Your health care provider may move the needle around the area where the trigger point is located until the tightness and twitching goes away. After the injection, your health care provider may put gentle pressure over the injection site. The injection site will be  covered with a bandage (dressing). The procedure may vary among health care providers and hospitals. What can I expect after treatment? After treatment, you may have soreness and stiffness for 1-2 days. Follow these instructions at home: Injection site care Remove your dressing in a few hours, or as told by your health care provider. Check your injection site every day for signs of infection. Check for: Redness, swelling, or pain. Fluid or blood. Warmth. Pus or a bad smell. Managing pain, stiffness, and swelling If directed, put ice on the affected area. To do this: Put ice in a plastic bag. Place a towel between your skin and the bag. Leave the ice on for 20 minutes, 2-3 times a day. Remove the ice if your skin turns bright red. This is very important. If you cannot feel pain, heat, or cold, you have a greater risk of damage to the area. Activity If you were given a sedative during the procedure, it can affect you for several hours. Do not drive or operate machinery until your health care provider says that it is safe. Do not take baths, swim, or use a hot tub until your health care provider approves. Return to your normal activities as told by your health care provider. Ask your health care provider what activities are safe for you. General instructions If you were asked to stop your regular medicines, ask your health care provider when you may start taking them again. You may be asked to see an occupational or physical therapist for exercises that reduce muscle strain and stretch the area of the trigger point. Keep all follow-up visits. This is important. Contact a health care provider if: Your pain comes back, and it is worse than before the injection. You may need more injections. You have chills or a fever. The injection site becomes more painful, red, swollen, or warm to the touch. Summary A trigger point injection is a shot given in the trigger point to help relieve  pain. Common places for trigger point injections are the neck, shoulders, upper back, and lower back. These injections do not always work for every person, but for some people, the injections can help to relieve pain for a few days to a few months. Contact a health care provider if symptoms come back or if they are worse than before treatment. Also, get help if the injection site becomes more painful, red, swollen, or warm to the touch. This information is not intended to replace advice given to you by your health care provider. Make sure you discuss any questions you have with your health care provider. Document Revised: 07/19/2020 Document Reviewed: 07/19/2020 Elsevier Patient Education  2024 [employer].

## 2022-11-22 ENCOUNTER — Telehealth: Payer: Self-pay | Admitting: *Deleted

## 2022-11-22 NOTE — Telephone Encounter (Signed)
Post procedure call:   no  questions or concerns.  

## 2022-11-23 DIAGNOSIS — M25552 Pain in left hip: Secondary | ICD-10-CM | POA: Diagnosis not present

## 2022-11-23 DIAGNOSIS — M25551 Pain in right hip: Secondary | ICD-10-CM | POA: Diagnosis not present

## 2022-11-30 DIAGNOSIS — Z981 Arthrodesis status: Secondary | ICD-10-CM | POA: Diagnosis not present

## 2022-11-30 DIAGNOSIS — M4105 Infantile idiopathic scoliosis, thoracolumbar region: Secondary | ICD-10-CM | POA: Diagnosis not present

## 2022-11-30 DIAGNOSIS — G894 Chronic pain syndrome: Secondary | ICD-10-CM | POA: Diagnosis not present

## 2022-12-05 DIAGNOSIS — M25552 Pain in left hip: Secondary | ICD-10-CM | POA: Diagnosis not present

## 2022-12-05 DIAGNOSIS — M25551 Pain in right hip: Secondary | ICD-10-CM | POA: Diagnosis not present

## 2022-12-12 DIAGNOSIS — M25552 Pain in left hip: Secondary | ICD-10-CM | POA: Diagnosis not present

## 2022-12-12 DIAGNOSIS — M25551 Pain in right hip: Secondary | ICD-10-CM | POA: Diagnosis not present

## 2022-12-19 ENCOUNTER — Ambulatory Visit: Payer: Medicare HMO | Admitting: Student in an Organized Health Care Education/Training Program

## 2022-12-21 DIAGNOSIS — Z3042 Encounter for surveillance of injectable contraceptive: Secondary | ICD-10-CM | POA: Diagnosis not present

## 2022-12-26 ENCOUNTER — Ambulatory Visit: Payer: Medicare HMO | Admitting: Student in an Organized Health Care Education/Training Program

## 2022-12-27 DIAGNOSIS — M25551 Pain in right hip: Secondary | ICD-10-CM | POA: Diagnosis not present

## 2022-12-27 DIAGNOSIS — M25552 Pain in left hip: Secondary | ICD-10-CM | POA: Diagnosis not present

## 2022-12-31 DIAGNOSIS — M4105 Infantile idiopathic scoliosis, thoracolumbar region: Secondary | ICD-10-CM | POA: Diagnosis not present

## 2022-12-31 DIAGNOSIS — G894 Chronic pain syndrome: Secondary | ICD-10-CM | POA: Diagnosis not present

## 2022-12-31 DIAGNOSIS — Z981 Arthrodesis status: Secondary | ICD-10-CM | POA: Diagnosis not present

## 2023-01-02 ENCOUNTER — Ambulatory Visit
Payer: Medicare HMO | Attending: Student in an Organized Health Care Education/Training Program | Admitting: Student in an Organized Health Care Education/Training Program

## 2023-01-02 ENCOUNTER — Encounter: Payer: Self-pay | Admitting: Student in an Organized Health Care Education/Training Program

## 2023-01-02 VITALS — BP 126/89 | HR 78 | Temp 97.3°F | Resp 16 | Ht <= 58 in | Wt 140.0 lb

## 2023-01-02 DIAGNOSIS — M542 Cervicalgia: Secondary | ICD-10-CM | POA: Diagnosis not present

## 2023-01-02 DIAGNOSIS — M4105 Infantile idiopathic scoliosis, thoracolumbar region: Secondary | ICD-10-CM | POA: Insufficient documentation

## 2023-01-02 DIAGNOSIS — M7918 Myalgia, other site: Secondary | ICD-10-CM | POA: Insufficient documentation

## 2023-01-02 MED ORDER — DIAZEPAM 5 MG PO TABS
5.0000 mg | ORAL_TABLET | ORAL | Status: AC
  Administered 2023-01-02: 5 mg via ORAL

## 2023-01-02 MED ORDER — ROPIVACAINE HCL 2 MG/ML IJ SOLN
9.0000 mL | Freq: Once | INTRAMUSCULAR | Status: AC
  Administered 2023-01-02: 9 mL via INTRA_ARTICULAR
  Filled 2023-01-02: qty 20

## 2023-01-02 MED ORDER — DIAZEPAM 5 MG PO TABS
ORAL_TABLET | ORAL | Status: AC
  Filled 2023-01-02: qty 1

## 2023-01-02 NOTE — Patient Instructions (Signed)

## 2023-01-02 NOTE — Progress Notes (Signed)
Safety precautions to be maintained throughout the outpatient stay will include: orient to surroundings, keep bed in low position, maintain call bell within reach at all times, provide assistance with transfer out of bed and ambulation.  

## 2023-01-02 NOTE — Progress Notes (Signed)
PROVIDER NOTE: Information contained herein reflects review and annotations entered in association with encounter. Interpretation of such information and data should be left to medically-trained personnel. Information provided to patient can be located elsewhere in the medical record under "Patient Instructions". Document created using STT-dictation technology, any transcriptional errors that may result from process are unintentional.    Patient: Brianna Arnold  Service Category: Procedure  Provider: Edward Jolly, MD  DOB: 05/27/1990  DOS: 01/02/2023  Location: [facility]  MRN: [id number]  Setting: Ambulatory - outpatient  Referring Provider: Marguarite Arbour, MD  Type: Established Patient  Specialty: Interventional Pain Management  PCP: Marguarite Arbour, MD   Primary Reason for Visit: Interventional Pain Management Treatment. CC: Shoulder Pain (left)   Procedure:          Anesthesia, Analgesia, Anxiolysis:  Type: Lumbar Paraspinal Muscle Trigger Point Injection (3+)         Quadratus lumborum, erector spinae, multifidus and periscapular region and bilateral cervical/trapezius TPI CPT: 20553 Primary Purpose: Therapeutic Approach: Percutaneous, ipsilateral approach. Laterality: Midline        Type: Local Anesthesia   Position: Prone   Indications: 1. Myofascial pain syndrome of lumbar spine   2. Infantile idiopathic scoliosis of thoracolumbar region   3. Cervicalgia      Pain Score: Pre-procedure: 4 /10 Post-procedure: 3 /10   Pre-op H&P Assessment:  Brianna Arnold is a 32 y.o. (year old), female patient, seen today for interventional treatment. She  has a past surgical history that includes Cervical spine surgery (07/2014); Back surgery (10/2014); and Anterior cervical decomp/discectomy fusion (N/A, 02/11/2019). Brianna Arnold has a current medication list which includes the following prescription(s): albuterol, baclofen, vitamin d3, cyclobenzaprine, dicyclomine,  ergocalciferol, famotidine, ferrous sulfate, gabapentin, medroxyprogesterone, meloxicam, methocarbamol, ofloxacin, promethazine, propranolol er, tramadol, venlafaxine xr, vitamin b-12, and zolpidem. Her primarily concern today is the Shoulder Pain (left)   Initial Vital Signs:  Pulse/HCG Rate: 78ECG Heart Rate: 95 Temp: (!) 97.3 F (36.3 C) Resp: 16 BP: 137/84 SpO2: 100 %  BMI: Estimated body mass index is 31.96 kg/m as calculated from the following:   Height as of this encounter: 4' 7.5" (1.41 m).   Weight as of this encounter: 140 lb (63.5 kg).  Risk Assessment: Allergies: Reviewed. She is allergic to hypafix [wound dressings], ativan [lorazepam], and tizanidine.  Allergy Precautions: None required Coagulopathies: Reviewed. None identified.  Blood-thinner therapy: None at this time Active Infection(s): Reviewed. None identified. Brianna Arnold is afebrile  Site Confirmation: Brianna Arnold was asked to confirm the procedure and laterality before marking the site Procedure checklist: Completed Consent: Before the procedure and under the influence of no sedative(s), amnesic(s), or anxiolytics, the patient was informed of the treatment options, risks and possible complications. To fulfill our ethical and legal obligations, as recommended by the American Medical Association's Code of Ethics, I have informed the patient of my clinical impression; the nature and purpose of the treatment or procedure; the risks, benefits, and possible complications of the intervention; the alternatives, including doing nothing; the risk(s) and benefit(s) of the alternative treatment(s) or procedure(s); and the risk(s) and benefit(s) of doing nothing. The patient was provided information about the general risks and possible complications associated with the procedure. These may include, but are not limited to: failure to achieve desired goals, infection, bleeding, organ or nerve damage, allergic reactions, paralysis, and  death. In addition, the patient was informed of those risks and complications associated to the procedure, such as failure to decrease  pain; infection; bleeding; organ or nerve damage with subsequent damage to sensory, motor, and/or autonomic systems, resulting in permanent pain, numbness, and/or weakness of one or several areas of the body; allergic reactions; (i.e.: anaphylactic reaction); and/or death. Furthermore, the patient was informed of those risks and complications associated with the medications. These include, but are not limited to: allergic reactions (i.e.: anaphylactic or anaphylactoid reaction(s)); adrenal axis suppression; blood sugar elevation that in diabetics may result in ketoacidosis or comma; water retention that in patients with history of congestive heart failure may result in shortness of breath, pulmonary edema, and decompensation with resultant heart failure; weight gain; swelling or edema; medication-induced neural toxicity; particulate matter embolism and blood vessel occlusion with resultant organ, and/or nervous system infarction; and/or aseptic necrosis of one or more joints. Finally, the patient was informed that Medicine is not an exact science; therefore, there is also the possibility of unforeseen or unpredictable risks and/or possible complications that may result in a catastrophic outcome. The patient indicated having understood very clearly. We have given the patient no guarantees and we have made no promises. Enough time was given to the patient to ask questions, all of which were answered to the patient's satisfaction. Brianna Arnold has indicated that she wanted to continue with the procedure. Attestation: I, the ordering provider, attest that I have discussed with the patient the benefits, risks, side-effects, alternatives, likelihood of achieving goals, and potential problems during recovery for the procedure that I have provided informed consent. Date  Time: 01/02/2023   2:04 PM  Pre-Procedure Preparation:  Monitoring: As per clinic protocol. Respiration, ETCO2, SpO2, BP, heart rate and rhythm monitor placed and checked for adequate function Safety Precautions: Patient was assessed for positional comfort and pressure points before starting the procedure. Time-out: I initiated and conducted the "Time-out" before starting the procedure, as per protocol. The patient was asked to participate by confirming the accuracy of the "Time Out" information. Verification of the correct person, site, and procedure were performed and confirmed by me, the nursing staff, and the patient. "Time-out" conducted as per Joint Commission's Universal Protocol (UP.01.01.01). Time: 1422  Description of Procedure:          Area Prepped: Entire Lower Lumbosacral Region & Right cervical region DuraPrep (Iodine Povacrylex [0.7% available iodine] and Isopropyl Alcohol, 74% w/w) Safety Precautions: Aspiration looking for blood return was conducted prior to all injections. At no point did we inject any substances, as a needle was being advanced. No attempts were made at seeking any paresthesias. Safe injection practices and needle disposal techniques used. Medications properly checked for expiration dates. SDV (single dose vial) medications used. Description of the Procedure: Protocol guidelines were followed. The patient was placed in position over the fluoroscopy table. The target area was identified and the area prepped in the usual manner. Skin & deeper tissues infiltrated with local anesthetic. Appropriate amount of time allowed to pass for local anesthetics to take effect. The procedure needles were then advanced to the target area. Proper needle placement secured. Negative aspiration confirmed. Solution injected in intermittent fashion, asking for systemic symptoms every 0.5cc of injectate. The needles were then removed and the area cleansed, making sure to leave some of the prepping solution back  to take advantage of its long term bactericidal properties.  Vitals:   01/02/23 1406 01/02/23 1420 01/02/23 1425 01/02/23 1432  BP: 137/84 (!) 146/111 (!) 146/115 126/89  Pulse: 78     Resp: 16 16 18 16   Temp: (!) 97.3 F (  36.3 C)     SpO2: 100% 100% 100% 100%  Weight: 140 lb (63.5 kg)     Height: 4' 7.5" (1.41 m)           Start Time: 1423 hrs. End Time: 1430 hrs. Materials:  Needle(s) Type: Regular needle Gauge: 27G Length: 1.5-in  Medication(s): Please see orders for medications and dosing details.   15 trigger points were also injected in the right and left cervical, right and left trapezius, & lumbar paraspinal region with dry needling performed.  Each trigger point was injected with 0.5 to 1 cc of 0.2% ropivacaine.   Post-operative Assessment:  Post-procedure Vital Signs:  Pulse/HCG Rate: 7885 Temp: (!) 97.3 F (36.3 C) Resp: 16 BP: 126/89 SpO2: 100 %  EBL: None  Complications: No immediate post-treatment complications observed by team, or reported by patient.  Note: The patient tolerated the entire procedure well. A repeat set of vitals were taken after the procedure and the patient was kept under observation following institutional policy, for this type of procedure. Post-procedural neurological assessment was performed, showing return to baseline, prior to discharge. The patient was provided with post-procedure discharge instructions, including a section on how to identify potential problems. Should any problems arise concerning this procedure, the patient was given instructions to immediately contact us, at any time, without hesitation. In any case, we plan to contact the patient by telephone for a follow-up status report regarding this interventional procedure.  Comments:  No additional relevant information.  Plan of Care   Medications ordered for procedure: Meds ordered this encounter  Medications   diazepam (VALIUM) tablet 5 mg    Make sure Flumazenil  is available in the pyxis when using this medication. If oversedation occurs, administer 0.2 mg IV over 15 sec. If after 45 sec no response, administer 0.2 mg again over 1 min; may repeat at 1 min intervals; not to exceed 4 doses (1 mg)   ropivacaine (PF) 2 mg/mL (0.2%) (NAROPIN) injection 9 mL    No orders of the defined types were placed in this encounter.    Medications administered: We administered diazepam and ropivacaine (PF) 2 mg/mL (0.2%).  See the medical record for exact dosing, route, and time of administration.  Follow-up plan:   No follow-ups on file.       Pharmacological management options:  Opioid Analgesics: Butrans patch, side effects at 7.5 mcg an hour, analgesic benefit with no side effects at 5 mcg an hour, continue  Membrane stabilizer: Continue gabapentin as prescribed  Muscle relaxant: Continue Flexeril as prescribed  NSAID: To be determined at a later time  Other analgesic(s): To be determined at a later time    Interventional management options: Ms. Smyth was informed that there is no guarantee that she would be a candidate for interventional therapies. The decision will be based on the results of diagnostic studies, as well as Ms. Wool's risk profile.  Procedure(s) hx Left axillary peripheral nerve stimulation: 12/30/19 providing significant pain relief.  Removed 02/29/2020              Recent Visits Date Type Provider Dept  11/21/22 Procedure visit Edward Jolly, MD [facility]  10/17/22 Procedure visit Edward Jolly, MD [facility]  Showing recent visits within past 90 days and meeting all other requirements Today's Visits Date Type Provider Dept  01/02/23 Procedure visit Edward Jolly, MD [facility]  Showing today's visits and meeting all other requirements Future Appointments Date Type Provider Dept  01/30/23 Appointment Edward Jolly, MD [facility]  Showing future appointments within next 90 days and  meeting all other requirements  Disposition: Discharge home  Discharge (Date  Time): 01/02/2023; 1434 hrs.   Primary Care Physician: Marguarite Arbour, MD Location: [facility] Note by: Edward Jolly, MD Date: 01/02/2023; Time: 2:48 PM  Disclaimer:  Medicine is not an exact science. The only guarantee in medicine is that nothing is guaranteed. It is important to note that the decision to proceed with this intervention was based on the information collected from the patient. The Data and conclusions were drawn from the patient's questionnaire, the interview, and the physical examination. Because the information was provided in large part by the patient, it cannot be guaranteed that it has not been purposely or unconsciously manipulated. Every effort has been made to obtain as much relevant data as possible for this evaluation. It is important to note that the conclusions that lead to this procedure are derived in large part from the available data. Always take into account that the treatment will also be dependent on availability of resources and existing treatment guidelines, considered by other Pain Management Practitioners as being common knowledge and practice, at the time of the intervention. For Medico-Legal purposes, it is also important to point out that variation in procedural techniques and pharmacological choices are the acceptable norm. The indications, contraindications, technique, and results of the above procedure should only be interpreted and judged by a Board-Certified Interventional Pain Specialist with extensive familiarity and expertise in the same exact procedure and technique.

## 2023-01-03 ENCOUNTER — Telehealth: Payer: Self-pay

## 2023-01-03 NOTE — Telephone Encounter (Signed)
Post procedure follow up.  LM 

## 2023-01-11 DIAGNOSIS — M25551 Pain in right hip: Secondary | ICD-10-CM | POA: Diagnosis not present

## 2023-01-11 DIAGNOSIS — M25552 Pain in left hip: Secondary | ICD-10-CM | POA: Diagnosis not present

## 2023-01-16 DIAGNOSIS — M25552 Pain in left hip: Secondary | ICD-10-CM | POA: Diagnosis not present

## 2023-01-16 DIAGNOSIS — M25551 Pain in right hip: Secondary | ICD-10-CM | POA: Diagnosis not present

## 2023-01-30 ENCOUNTER — Encounter: Payer: Self-pay | Admitting: Student in an Organized Health Care Education/Training Program

## 2023-01-30 ENCOUNTER — Ambulatory Visit
Payer: Medicare HMO | Attending: Student in an Organized Health Care Education/Training Program | Admitting: Student in an Organized Health Care Education/Training Program

## 2023-01-30 VITALS — BP 159/105 | HR 95 | Temp 98.4°F | Resp 18 | Ht <= 58 in | Wt 140.0 lb

## 2023-01-30 DIAGNOSIS — G894 Chronic pain syndrome: Secondary | ICD-10-CM | POA: Diagnosis not present

## 2023-01-30 DIAGNOSIS — Z981 Arthrodesis status: Secondary | ICD-10-CM | POA: Diagnosis not present

## 2023-01-30 DIAGNOSIS — M4105 Infantile idiopathic scoliosis, thoracolumbar region: Secondary | ICD-10-CM | POA: Insufficient documentation

## 2023-01-30 DIAGNOSIS — M7918 Myalgia, other site: Secondary | ICD-10-CM | POA: Diagnosis not present

## 2023-01-30 MED ORDER — ROPIVACAINE HCL 2 MG/ML IJ SOLN
INTRAMUSCULAR | Status: AC
  Filled 2023-01-30: qty 20

## 2023-01-30 MED ORDER — DIAZEPAM 5 MG PO TABS
5.0000 mg | ORAL_TABLET | ORAL | Status: AC
  Administered 2023-01-30: 5 mg via ORAL

## 2023-01-30 MED ORDER — DIAZEPAM 5 MG PO TABS
ORAL_TABLET | ORAL | Status: AC
  Filled 2023-01-30: qty 1

## 2023-01-30 MED ORDER — ROPIVACAINE HCL 2 MG/ML IJ SOLN
18.0000 mL | Freq: Once | INTRAMUSCULAR | Status: AC
  Administered 2023-01-30: 18 mL via PERINEURAL

## 2023-01-30 NOTE — Progress Notes (Signed)
Safety precautions to be maintained throughout the outpatient stay will include: orient to surroundings, keep bed in low position, maintain call bell within reach at all times, provide assistance with transfer out of bed and ambulation.  

## 2023-01-30 NOTE — Patient Instructions (Signed)

## 2023-01-30 NOTE — Progress Notes (Signed)
PROVIDER NOTE: Information contained herein reflects review and annotations entered in association with encounter. Interpretation of such information and data should be left to medically-trained personnel. Information provided to patient can be located elsewhere in the medical record under "Patient Instructions". Document created using STT-dictation technology, any transcriptional errors that may result from process are unintentional.    Patient: Brianna Arnold  Service Category: Procedure  Provider: Edward Jolly, MD  DOB: 08/20/1990  DOS: 01/30/2023  Location: [facility]  MRN: [id number]  Setting: Ambulatory - outpatient  Referring Provider: Marguarite Arbour, MD  Type: Established Patient  Specialty: Interventional Pain Management  PCP: Marguarite Arbour, MD   Primary Reason for Visit: Interventional Pain Management Treatment. CC: Back Pain (Upper  to lower )   Procedure:          Anesthesia, Analgesia, Anxiolysis:  Type: Lumbar Paraspinal Muscle Trigger Point Injection (3+)         Quadratus lumborum, erector spinae, multifidus and periscapular region and bilateral cervical/trapezius TPI CPT: 20553 Primary Purpose: Therapeutic Approach: Percutaneous, ipsilateral approach. Laterality: Midline        Type: Local Anesthesia   Position: Prone   Indications: 1. Myofascial pain syndrome of lumbar spine   2. Infantile idiopathic scoliosis of thoracolumbar region   3. Chronic pain syndrome      Pain Score: Pre-procedure: 8 /10 Post-procedure: 3 /10   Pre-op H&P Assessment:  Brianna Arnold is a 32 y.o. (year old), female patient, seen today for interventional treatment. She  has a past surgical history that includes Cervical spine surgery (07/2014); Back surgery (10/2014); and Anterior cervical decomp/discectomy fusion (N/A, 02/11/2019). Brianna Arnold has a current medication list which includes the following prescription(s): albuterol, baclofen, vitamin d3,  cyclobenzaprine, dicyclomine, ergocalciferol, famotidine, ferrous sulfate, gabapentin, medroxyprogesterone, meloxicam, methocarbamol, ofloxacin, promethazine, propranolol er, tramadol, venlafaxine xr, vitamin b-12, and zolpidem. Her primarily concern today is the Back Pain (Upper  to lower )   Initial Vital Signs:  Pulse/HCG Rate: 86  Temp: 98.4 F (36.9 C) Resp: 16 BP: (!) 123/96 SpO2: 100 %  BMI: Estimated body mass index is 31.96 kg/m as calculated from the following:   Height as of this encounter: 4' 7.5" (1.41 m).   Weight as of this encounter: 140 lb (63.5 kg).  Risk Assessment: Allergies: Reviewed. She is allergic to hypafix [wound dressings], ativan [lorazepam], and tizanidine.  Allergy Precautions: None required Coagulopathies: Reviewed. None identified.  Blood-thinner therapy: None at this time Active Infection(s): Reviewed. None identified. Brianna Arnold is afebrile  Site Confirmation: Brianna Arnold was asked to confirm the procedure and laterality before marking the site Procedure checklist: Completed Consent: Before the procedure and under the influence of no sedative(s), amnesic(s), or anxiolytics, the patient was informed of the treatment options, risks and possible complications. To fulfill our ethical and legal obligations, as recommended by the American Medical Association's Code of Ethics, I have informed the patient of my clinical impression; the nature and purpose of the treatment or procedure; the risks, benefits, and possible complications of the intervention; the alternatives, including doing nothing; the risk(s) and benefit(s) of the alternative treatment(s) or procedure(s); and the risk(s) and benefit(s) of doing nothing. The patient was provided information about the general risks and possible complications associated with the procedure. These may include, but are not limited to: failure to achieve desired goals, infection, bleeding, organ or nerve damage, allergic  reactions, paralysis, and death. In addition, the patient was informed of those risks and complications associated  to the procedure, such as failure to decrease pain; infection; bleeding; organ or nerve damage with subsequent damage to sensory, motor, and/or autonomic systems, resulting in permanent pain, numbness, and/or weakness of one or several areas of the body; allergic reactions; (i.e.: anaphylactic reaction); and/or death. Furthermore, the patient was informed of those risks and complications associated with the medications. These include, but are not limited to: allergic reactions (i.e.: anaphylactic or anaphylactoid reaction(s)); adrenal axis suppression; blood sugar elevation that in diabetics may result in ketoacidosis or comma; water retention that in patients with history of congestive heart failure may result in shortness of breath, pulmonary edema, and decompensation with resultant heart failure; weight gain; swelling or edema; medication-induced neural toxicity; particulate matter embolism and blood vessel occlusion with resultant organ, and/or nervous system infarction; and/or aseptic necrosis of one or more joints. Finally, the patient was informed that Medicine is not an exact science; therefore, there is also the possibility of unforeseen or unpredictable risks and/or possible complications that may result in a catastrophic outcome. The patient indicated having understood very clearly. We have given the patient no guarantees and we have made no promises. Enough time was given to the patient to ask questions, all of which were answered to the patient's satisfaction. Brianna Arnold has indicated that she wanted to continue with the procedure. Attestation: I, the ordering provider, attest that I have discussed with the patient the benefits, risks, side-effects, alternatives, likelihood of achieving goals, and potential problems during recovery for the procedure that I have provided informed  consent. Date  Time: 01/30/2023  1:32 PM  Pre-Procedure Preparation:  Monitoring: As per clinic protocol. Respiration, ETCO2, SpO2, BP, heart rate and rhythm monitor placed and checked for adequate function Safety Precautions: Patient was assessed for positional comfort and pressure points before starting the procedure. Time-out: I initiated and conducted the "Time-out" before starting the procedure, as per protocol. The patient was asked to participate by confirming the accuracy of the "Time Out" information. Verification of the correct person, site, and procedure were performed and confirmed by me, the nursing staff, and the patient. "Time-out" conducted as per Joint Commission's Universal Protocol (UP.01.01.01). Time: 1349  Description of Procedure:          Area Prepped: Entire Lower Lumbosacral Region & Right cervical region DuraPrep (Iodine Povacrylex [0.7% available iodine] and Isopropyl Alcohol, 74% w/w) Safety Precautions: Aspiration looking for blood return was conducted prior to all injections. At no point did we inject any substances, as a needle was being advanced. No attempts were made at seeking any paresthesias. Safe injection practices and needle disposal techniques used. Medications properly checked for expiration dates. SDV (single dose vial) medications used. Description of the Procedure: Protocol guidelines were followed. The patient was placed in position over the fluoroscopy table. The target area was identified and the area prepped in the usual manner. Skin & deeper tissues infiltrated with local anesthetic. Appropriate amount of time allowed to pass for local anesthetics to take effect. The procedure needles were then advanced to the target area. Proper needle placement secured. Negative aspiration confirmed. Solution injected in intermittent fashion, asking for systemic symptoms every 0.5cc of injectate. The needles were then removed and the area cleansed, making sure to leave  some of the prepping solution back to take advantage of its long term bactericidal properties.  Vitals:   01/30/23 1333 01/30/23 1346 01/30/23 1350 01/30/23 1354  BP: (!) 123/96 (!) 134/99 (!) 134/104 (!) 159/105  Pulse: 86 99 94 95  Resp: 16 16 18    Temp: 98.4 F (36.9 C)     TempSrc: Temporal     SpO2: 100% 99% 100% 100%  Weight: 140 lb (63.5 kg)     Height: 4' 7.5" (1.41 m)           Start Time: 1349 hrs. End Time: 1354 hrs. Materials:  Needle(s) Type: Regular needle Gauge: 27G Length: 1.5-in  Medication(s): Please see orders for medications and dosing details.   15 trigger points were also injected in the right and left cervical, right and left trapezius, & lumbar paraspinal region with dry needling performed.  Each trigger point was injected with 0.5 to 1 cc of 0.2% ropivacaine.   Post-operative Assessment:  Post-procedure Vital Signs:  Pulse/HCG Rate: 95  Temp: 98.4 F (36.9 C) Resp: 18 BP: (!) 159/105 SpO2: 100 %  EBL: None  Complications: No immediate post-treatment complications observed by team, or reported by patient.  Note: The patient tolerated the entire procedure well. A repeat set of vitals were taken after the procedure and the patient was kept under observation following institutional policy, for this type of procedure. Post-procedural neurological assessment was performed, showing return to baseline, prior to discharge. The patient was provided with post-procedure discharge instructions, including a section on how to identify potential problems. Should any problems arise concerning this procedure, the patient was given instructions to immediately contact us, at any time, without hesitation. In any case, we plan to contact the patient by telephone for a follow-up status report regarding this interventional procedure.  Comments:  No additional relevant information.  Plan of Care   Medications ordered for procedure: Meds ordered this encounter   Medications   ropivacaine (PF) 2 mg/mL (0.2%) (NAROPIN) injection 18 mL   diazepam (VALIUM) tablet 5 mg    Make sure Flumazenil is available in the pyxis when using this medication. If oversedation occurs, administer 0.2 mg IV over 15 sec. If after 45 sec no response, administer 0.2 mg again over 1 min; may repeat at 1 min intervals; not to exceed 4 doses (1 mg)    No orders of the defined types were placed in this encounter.    Medications administered: We administered ropivacaine (PF) 2 mg/mL (0.2%) and diazepam.  See the medical record for exact dosing, route, and time of administration.  Follow-up plan:   Return if symptoms worsen or fail to improve.       Pharmacological management options:  Opioid Analgesics: Butrans patch, side effects at 7.5 mcg an hour, analgesic benefit with no side effects at 5 mcg an hour, continue  Membrane stabilizer: Continue gabapentin as prescribed  Muscle relaxant: Continue Flexeril as prescribed  NSAID: To be determined at a later time  Other analgesic(s): To be determined at a later time    Interventional management options: Ms. Deline was informed that there is no guarantee that she would be a candidate for interventional therapies. The decision will be based on the results of diagnostic studies, as well as Ms. Dieter's risk profile.  Procedure(s) hx Left axillary peripheral nerve stimulation: 12/30/19 providing significant pain relief.  Removed 02/29/2020              Recent Visits Date Type Provider Dept  01/02/23 Procedure visit Edward Jolly, MD [facility]  11/21/22 Procedure visit Edward Jolly, MD [facility]  Showing recent visits within past 90 days and meeting all other requirements Today's Visits Date Type Provider Dept  01/30/23 Procedure visit Edward Jolly,  MD [facility]  Showing today's visits and meeting all other requirements Future Appointments Date Type Provider Dept  02/26/23 Appointment  Edward Jolly, MD [facility]  03/06/23 Appointment Edward Jolly, MD [facility]  Showing future appointments within next 90 days and meeting all other requirements  Disposition: Discharge home  Discharge (Date  Time): 01/30/2023; 1359 hrs.   Primary Care Physician: Marguarite Arbour, MD Location: [facility] Note by: Edward Jolly, MD Date: 01/30/2023; Time: 2:14 PM  Disclaimer:  Medicine is not an exact science. The only guarantee in medicine is that nothing is guaranteed. It is important to note that the decision to proceed with this intervention was based on the information collected from the patient. The Data and conclusions were drawn from the patient's questionnaire, the interview, and the physical examination. Because the information was provided in large part by the patient, it cannot be guaranteed that it has not been purposely or unconsciously manipulated. Every effort has been made to obtain as much relevant data as possible for this evaluation. It is important to note that the conclusions that lead to this procedure are derived in large part from the available data. Always take into account that the treatment will also be dependent on availability of resources and existing treatment guidelines, considered by other Pain Management Practitioners as being common knowledge and practice, at the time of the intervention. For Medico-Legal purposes, it is also important to point out that variation in procedural techniques and pharmacological choices are the acceptable norm. The indications, contraindications, technique, and results of the above procedure should only be interpreted and judged by a Board-Certified Interventional Pain Specialist with extensive familiarity and expertise in the same exact procedure and technique.

## 2023-01-31 ENCOUNTER — Telehealth: Payer: Self-pay | Admitting: *Deleted

## 2023-01-31 NOTE — Telephone Encounter (Signed)
No problems post procedure. 

## 2023-02-26 ENCOUNTER — Ambulatory Visit
Payer: Medicare HMO | Attending: Student in an Organized Health Care Education/Training Program | Admitting: Student in an Organized Health Care Education/Training Program

## 2023-02-26 ENCOUNTER — Encounter: Payer: Self-pay | Admitting: Student in an Organized Health Care Education/Training Program

## 2023-02-26 VITALS — BP 123/80 | HR 73 | Temp 97.9°F | Resp 16 | Ht <= 58 in | Wt 145.0 lb

## 2023-02-26 DIAGNOSIS — G894 Chronic pain syndrome: Secondary | ICD-10-CM | POA: Diagnosis not present

## 2023-02-26 DIAGNOSIS — Z981 Arthrodesis status: Secondary | ICD-10-CM | POA: Diagnosis not present

## 2023-02-26 DIAGNOSIS — M7918 Myalgia, other site: Secondary | ICD-10-CM | POA: Diagnosis not present

## 2023-02-26 DIAGNOSIS — M4105 Infantile idiopathic scoliosis, thoracolumbar region: Secondary | ICD-10-CM | POA: Diagnosis not present

## 2023-02-26 DIAGNOSIS — M542 Cervicalgia: Secondary | ICD-10-CM | POA: Insufficient documentation

## 2023-02-26 MED ORDER — TRAMADOL HCL 50 MG PO TABS: 50.0000 mg | ORAL_TABLET | Freq: Two times a day (BID) | ORAL | 2 refills | Status: AC | PRN

## 2023-02-26 MED ORDER — BUPRENORPHINE 5 MCG/HR TD PTWK: 1.0000 | MEDICATED_PATCH | TRANSDERMAL | 3 refills | Status: DC

## 2023-02-26 NOTE — Progress Notes (Signed)
PROVIDER NOTE: Information contained herein reflects review and annotations entered in association with encounter. Interpretation of such information and data should be left to medically-trained personnel. Information provided to patient can be located elsewhere in the medical record under "Patient Instructions". Document created using STT-dictation technology, any transcriptional errors that may result from process are unintentional.    Patient: Brianna Arnold  Service Category: E/M  Provider: Edward Jolly, MD  DOB: March 02, 1991  DOS: 02/26/2023  Specialty: Interventional Pain Management  MRN: [id number]  Setting: Ambulatory outpatient  PCP: Marguarite Arbour, MD  Type: Established Patient    Referring Provider: Marguarite Arbour, MD  Location: Office  Delivery: Face-to-face     HPI  Brianna Arnold, a 32 y.o. year old female, is here today because of her Chronic pain syndrome [G89.4]. Ms. Pulido's primary complain today is Back Pain (Entire bilateral ), Neck Pain (Bilateral ), and Leg Pain (Right ) Last encounter: My last encounter with her was on 01/30/23 Pertinent problems: Ms. Siddoway has Status post cervical spinal fusion; Migraine without aura and without status migrainosus, not intractable; History of lumbar spinal fusion (T11- iliac); Cervicalgia; Chronic left shoulder pain; Disorder of left rotator cuff; and Chronic pain syndrome on their pertinent problem list. Pain Assessment: Severity of Chronic pain is reported as a 4 /10. Location: Back (neck) Lower, Left, Right/back pain down in right hip and down right leg. Onset: More than a month ago. Quality: Constant, Aching, Tightness, Discomfort. Timing: Constant. Modifying factor(s): medications, heat/ice, rest and TENS. Vitals:  height is 4\' 7"  (1.397 m) and weight is 145 lb (65.8 kg). Her temporal temperature is 97.9 F (36.6 C). Her blood pressure is 123/80 and her pulse is 73. Her respiration is 16 and oxygen saturation is  99%.   Reason for encounter: medication management.  No change in medical history since last visit.  Patient's pain is at baseline.  Patient continues multimodal pain regimen as prescribed: Butrans patch 5 mcg an hour and Gabapentin 300 mg TID. Tramadol PRN. States that it provides pain relief and improvement in functional status. Finds benefit with every 3 to every 4 week cervical, thoracic, lumbar TPI's.   Pharmacotherapy Assessment  Analgesic: Butrans 5 mcg/hr q 7days   Monitoring: [place] PMP: PDMP not reviewed this encounter.       Pharmacotherapy: No side-effects or adverse reactions reported. Compliance: No problems identified. Effectiveness: Clinically acceptable.  Vernie Ammons, RN  02/26/2023 10:13 AM  Sign when Signing Visit Nursing Pain Medication Assessment:  Safety precautions to be maintained throughout the outpatient stay will include: orient to surroundings, keep bed in low position, maintain call bell within reach at all times, provide assistance with transfer out of bed and ambulation.  Medication Inspection Compliance:  patch  Medication #1:  buprenorphine transdermal  Pill/Patch Count:  1 of 4 pills remain Pill/Patch Appearance: Markings consistent with prescribed medication Bottle Appearance: Standard pharmacy container. Clearly labeled. Filled Date: 77 / 01 / 2024 Last Medication intake:   to apply another patch tomorrow   Medication #2: Tramadol (Ultram) Pill/Patch Count: No pills available to be counted. Pill/Patch Appearance: No markings Bottle Appearance: No container available. Did not bring bottle(s) to appointment. Filled Date: ?  / ?  / 2024 Last Medication intake:   takes prn, it has been 1 month since last dose   UDS:  Summary  Date Value Ref Range Status  10/15/2019 Note  Final    Comment:    ==================================================================== Compliance Drug  Analysis,  [place] ==================================================================== Test                             Result       Flag       Units  Drug Absent but Declared for Prescription Verification   Buprenorphine                  Not Detected UNEXPECTED ng/mg creat    Transdermal buprenorphine, as indicated in the declared medication    list, is not always detected even when used as directed.    Tramadol                       Not Detected UNEXPECTED ng/mg creat   Gabapentin                     Not Detected UNEXPECTED   Cyclobenzaprine                Not Detected UNEXPECTED   Trazodone                      Not Detected UNEXPECTED   Promethazine                   Not Detected UNEXPECTED ==================================================================== Test                      Result    Flag   Units      Ref Range   Creatinine              91               mg/dL      >=40 ==================================================================== Declared Medications:  The flagging and interpretation on this report are based on the  following declared medications.  Unexpected results may arise from  inaccuracies in the declared medications.   **Note: The testing scope of this panel includes these medications:   Cyclobenzaprine (Flexeril)  Gabapentin (Neurontin)  Promethazine (Phenergan)  Tramadol (Ultram)  Trazodone (Desyrel)   **Note: The testing scope of this panel does not include small to  moderate amounts of these reported medications:   Buprenorphine Patch (BuTrans)   **Note: The testing scope of this panel does not include the  following reported medications:   Albuterol (Ventolin HFA)  Ethinyl Estradiol (Sprintec)  Famotidine (Pepcid)  Iron  Norgestimate (Sprintec)  Vitamin B12  Vitamin D3 ==================================================================== For clinical consultation, please call [phone]. ====================================================================      ROS  Constitutional: Denies any fever or chills Gastrointestinal: No reported hemesis, hematochezia, vomiting, or acute GI distress Musculoskeletal:  Chronic cervical, thoracic, lumbar spine pain, left hip pain Neurological: No reported episodes of acute onset apraxia, aphasia, dysarthria, agnosia, amnesia, paralysis, loss of coordination, or loss of consciousness  Medication Review  Vitamin D3, albuterol, baclofen, buprenorphine, cyanocobalamin, cyclobenzaprine, dicyclomine, ergocalciferol, famotidine, ferrous sulfate, gabapentin, medroxyPROGESTERone, meloxicam, methocarbamol, ofloxacin, promethazine, propranolol ER, traMADol, venlafaxine XR, vitamin B-12, and zolpidem  History Review  Allergy: Ms. Gossett is allergic to hypafix [wound dressings], ativan [lorazepam], and tizanidine. Drug: Ms. Riehle  reports no history of drug use. Alcohol:  has no history on file for alcohol use. Tobacco:  reports that she has never smoked. She has never used smokeless tobacco. Social: Ms. Kofman  reports that she has never smoked. She has never used smokeless tobacco. She reports that  she does not use drugs. Medical:  has a past medical history of Anemia and Jarcho-Levin syndrome. Surgical: Ms. Moilanen  has a past surgical history that includes Cervical spine surgery (07/2014); Back surgery (10/2014); and Anterior cervical decomp/discectomy fusion (N/A, 02/11/2019). Family: family history includes Hypertension in her father.  Laboratory Chemistry Profile   Renal Lab Results  Component Value Date   BUN 6 02/15/2019   CREATININE 0.63 02/15/2019   GFRAA >60 02/15/2019   GFRNONAA >60 02/15/2019    Hepatic Lab Results  Component Value Date   AST 17 02/14/2019   ALT 12 02/14/2019   ALBUMIN 3.9 02/14/2019   ALKPHOS 55 02/14/2019   LIPASE 18 02/14/2019    Electrolytes Lab Results  Component Value Date   NA 137  02/15/2019   K 4.1 02/15/2019   CL 106 02/15/2019   CALCIUM 8.1 (L) 02/15/2019    Bone No results found for: "VD25OH", "VD125OH2TOT", "IE3329JJ8", "AC1660YT0", "25OHVITD1", "25OHVITD2", "25OHVITD3", "TESTOFREE", "TESTOSTERONE"  Inflammation (CRP: Acute Phase) (ESR: Chronic Phase) Lab Results  Component Value Date   LATICACIDVEN 1.6 04/19/2015         Note: Above Lab results reviewed.  Recent Imaging Review  [place] LIMITED JOINT SPACE STRUCTURES LOW BILAT Procedure:  Injection of right hip joint under ultrasound guidance. Ultrasound guidance utilized for in-plane approach to the right hip joint,  no effusion noted Samsung HS60 device utilized with permanent recording / reporting. Verbal informed consent obtained and verified. Skin prepped in a sterile fashion. Ethyl chloride for topical local analgesia.  Completed without difficulty and tolerated well. Medication: triamcinolone acetonide 40 mg/mL suspension for injection 1 mL  total and 2 mL lidocaine 1% without epinephrine utilized for needle  placement anesthetic Advised to contact for fevers/chills, erythema, induration, drainage, or  persistent bleeding.  Procedure:  Injection of right greater trochanter under ultrasound  guidance. Ultrasound guidance utilized for out of plane approach to the right  greater trochanter, no clearly visualized sonographic evidence of  tendinopathy Samsung HS60 device utilized with permanent recording / reporting. Verbal informed consent obtained and verified. Skin prepped in a sterile fashion. Ethyl chloride for topical local analgesia.  Completed without difficulty and tolerated well. Medication: triamcinolone acetonide 40 mg/mL suspension for injection 1 mL  total and 2 mL lidocaine 1% without epinephrine utilized for needle  placement anesthetic Advised to contact for fevers/chills, erythema, induration, drainage, or  persistent bleeding.  Procedure:  Injection of left hip joint under  ultrasound guidance. Ultrasound guidance utilized for in-plane approach to left hip joint, no  effusion visualized Samsung HS60 device utilized with permanent recording / reporting. Verbal informed consent obtained and verified. Skin prepped in a sterile fashion. Ethyl chloride for topical local analgesia.  Completed without difficulty and tolerated well. Medication: triamcinolone acetonide 40 mg/mL suspension for injection 1 mL  total and 2 mL lidocaine 1% without epinephrine utilized for needle  placement anesthetic Advised to contact for fevers/chills, erythema, induration, drainage, or  persistent bleeding.  Procedure:  Injection of left greater trochanter under ultrasound  guidance. Ultrasound guidance utilized for out of plane approach to left greater  trochanteric region, no effusion Samsung HS60 device utilized with permanent recording / reporting. Verbal informed consent obtained and verified. Skin prepped in a sterile fashion. Ethyl chloride for topical local analgesia.  Completed without difficulty and tolerated well. Medication: triamcinolone acetonide 40 mg/mL suspension for injection 1 mL  total and 2 mL lidocaine 1% without epinephrine utilized for needle  placement anesthetic Advised to contact for  fevers/chills, erythema, induration, drainage, or  persistent bleeding. Note: Reviewed        Physical Exam  General appearance: Well nourished, well developed, and well hydrated. In no apparent acute distress Mental status: Alert, oriented x 3 (person, place, & time)       Respiratory: No evidence of acute respiratory distress Eyes: [name] Vitals: BP 123/80 (BP Location: Right Arm, Patient Position: Sitting, Cuff Size: Normal)   Pulse 73   Temp 97.9 F (36.6 C) (Temporal)   Resp 16   Ht 4\' 7"  (1.397 m)   Wt 145 lb (65.8 kg)   SpO2 99%   BMI 33.70 kg/m  BMI: Estimated body mass index is 33.7 kg/m as calculated from the following:   Height as of this encounter:  4\' 7"  (1.397 m).   Weight as of this encounter: 145 lb (65.8 kg). Ideal: Patient must be at least 60 in tall to calculate ideal body weight  Cervical Spine Exam  Skin & Axial Inspection: Well healed scar from previous spine surgery detected Alignment: Asymmetric Functional ROM: Pain restricted ROM      Stability: No instability detected Muscle Tone/Strength: Functionally intact. No obvious neuro-muscular anomalies detected. Sensory (Neurological): Neuropathic pain pattern Palpation: No palpable anomalies                 Thoracic Spine Area Exam  Skin & Axial Inspection: Well healed scar from previous spine surgery detected Alignment: Symmetrical Functional ROM: Mechanically restricted ROM Stability: No instability detected Muscle Tone/Strength: Functionally intact. No obvious neuro-muscular anomalies detected. Sensory (Neurological): Neurogenic pain pattern Muscle strength & Tone: No palpable anomalies   Lumbar Exam  Skin & Axial Inspection: Well healed scar from previous spine surgery detected Alignment: Scoliosis detected, significant Functional ROM: Pain restricted ROM       Stability: No instability detected Muscle Tone/Strength: Functionally intact. No obvious neuro-muscular anomalies detected. Sensory (Neurological):  Musculoskeletal pain pattern Palpation: Complains of area being tender to palpation         Gait & Posture Assessment  Ambulation: Limited Gait: Antalgic Posture: Difficulty standing up straight, due to pain    Lower Extremity Exam      Side: Right lower extremity   Side: Left lower extremity  Stability: No instability observed           Stability: No instability observed          Skin & Extremity Inspection: Skin color, temperature, and hair growth are WNL. No peripheral edema or cyanosis. No masses, redness, swelling, asymmetry, or associated skin lesions. No contractures.   Skin & Extremity Inspection: Skin color, temperature, and hair growth are WNL. No  peripheral edema or cyanosis. No masses, redness, swelling, asymmetry, or associated skin lesions. No contractures.  Functional ROM: Pain restricted ROM of the left hip, pain overlying SI joint and piriformis                   Functional ROM: Pain restricted ROM                  Muscle Tone/Strength: Functionally intact. No obvious neuro-muscular anomalies detected.   Muscle Tone/Strength: Functionally intact. No obvious neuro-muscular anomalies detected.  Sensory (Neurological): Musculoskeletal       Sensory (Neurological): Unimpaired        DTR: Patellar: deferred today Achilles: deferred today Plantar: deferred today   DTR: Patellar: deferred today Achilles: deferred today Plantar: deferred today  Palpation: No palpable anomalies   Palpation: No palpable anomalies  Assessment   Diagnosis Status  1. Chronic pain syndrome   2. Infantile idiopathic scoliosis of thoracolumbar region   3. Myofascial pain syndrome of lumbar spine   4. Cervicalgia   5. History of lumbar spinal fusion (T11- iliac)     Controlled Controlled Controlled     Plan of Care    Ms. Lorie Cleckley Allsup has a current medication list which includes the following long-term medication(s): albuterol, dicyclomine, famotidine, ferrous sulfate, gabapentin, medroxyprogesterone, promethazine, propranolol er, and zolpidem.  Pharmacotherapy (Medications Ordered): Meds ordered this encounter  Medications   buprenorphine (BUTRANS) 5 MCG/HR PTWK    Sig: Place 1 patch onto the skin once a week.    Dispense:  4 patch    Refill:  3   traMADol (ULTRAM) 50 MG tablet    Sig: Take 1 tablet (50 mg total) by mouth every 12 (twelve) hours as needed.    Dispense:  60 tablet    Refill:  2    Follow-up plan:   Return in about 4 months (around 06/26/2023) for MM, F2F.     Pharmacological management options:  Opioid Analgesics: Butrans patch, side effects at 7.5 mcg an hour, analgesic benefit with no side effects  at 5 mcg an hour, continue  Membrane stabilizer: Continue gabapentin as prescribed  Muscle relaxant: Continue Flexeril as prescribed  NSAID: To be determined at a later time  Other analgesic(s): To be determined at a later time    Interventional management options: Ms. Vecchiarelli was informed that there is no guarantee that she would be a candidate for interventional therapies. The decision will be based on the results of diagnostic studies, as well as Ms. Bracknell's risk profile.  Procedure(s) hx Left axillary peripheral nerve stimulation: 12/30/19 providing significant pain relief.  Removed 02/29/2020               Recent Visits Date Type Provider Dept  01/30/23 Procedure visit Edward Jolly, MD [facility]  01/02/23 Procedure visit Edward Jolly, MD [facility]  Showing recent visits within past 90 days and meeting all other requirements Today's Visits Date Type Provider Dept  02/26/23 Office Visit Edward Jolly, MD [facility]  Showing today's visits and meeting all other requirements Future Appointments Date Type Provider Dept  03/06/23 Appointment Edward Jolly, MD [facility]  Showing future appointments within next 90 days and meeting all other requirements  I discussed the assessment and treatment plan with the patient. The patient was provided an opportunity to ask questions and all were answered. The patient agreed with the plan and demonstrated an understanding of the instructions.  Patient advised to call back or seek an in-person evaluation if the symptoms or condition worsens.  Duration of encounter: .  Note by: Edward Jolly, MD Date: 02/26/2023; Time: 10:33 AM

## 2023-02-26 NOTE — Progress Notes (Signed)
Nursing Pain Medication Assessment:  Safety precautions to be maintained throughout the outpatient stay will include: orient to surroundings, keep bed in low position, maintain call bell within reach at all times, provide assistance with transfer out of bed and ambulation.  Medication Inspection Compliance:  patch  Medication #1:  buprenorphine transdermal  Pill/Patch Count:  1 of 4 pills remain Pill/Patch Appearance: Markings consistent with prescribed medication Bottle Appearance: Standard pharmacy container. Clearly labeled. Filled Date: 44 / 01 / 2024 Last Medication intake:   to apply another patch tomorrow   Medication #2: Tramadol (Ultram) Pill/Patch Count: No pills available to be counted. Pill/Patch Appearance: No markings Bottle Appearance: No container available. Did not bring bottle(s) to appointment. Filled Date: ?  / ?  / 2024 Last Medication intake:   takes prn, it has been 1 month since last dose

## 2023-03-02 DIAGNOSIS — M4105 Infantile idiopathic scoliosis, thoracolumbar region: Secondary | ICD-10-CM | POA: Diagnosis not present

## 2023-03-02 DIAGNOSIS — G894 Chronic pain syndrome: Secondary | ICD-10-CM | POA: Diagnosis not present

## 2023-03-02 DIAGNOSIS — Z981 Arthrodesis status: Secondary | ICD-10-CM | POA: Diagnosis not present

## 2023-03-06 ENCOUNTER — Ambulatory Visit
Payer: Medicare HMO | Attending: Student in an Organized Health Care Education/Training Program | Admitting: Student in an Organized Health Care Education/Training Program

## 2023-03-06 ENCOUNTER — Encounter: Payer: Self-pay | Admitting: Student in an Organized Health Care Education/Training Program

## 2023-03-06 VITALS — BP 160/105 | HR 84 | Temp 98.6°F | Resp 18 | Ht <= 58 in | Wt 142.0 lb

## 2023-03-06 DIAGNOSIS — G894 Chronic pain syndrome: Secondary | ICD-10-CM | POA: Diagnosis not present

## 2023-03-06 DIAGNOSIS — M7918 Myalgia, other site: Secondary | ICD-10-CM | POA: Insufficient documentation

## 2023-03-06 DIAGNOSIS — M542 Cervicalgia: Secondary | ICD-10-CM | POA: Diagnosis not present

## 2023-03-06 MED ORDER — DIAZEPAM 5 MG PO TABS
5.0000 mg | ORAL_TABLET | ORAL | Status: AC
  Administered 2023-03-06: 5 mg via ORAL

## 2023-03-06 MED ORDER — DIAZEPAM 5 MG PO TABS
ORAL_TABLET | ORAL | Status: AC
  Filled 2023-03-06: qty 1

## 2023-03-06 MED ORDER — ROPIVACAINE HCL 2 MG/ML IJ SOLN
INTRAMUSCULAR | Status: AC
  Filled 2023-03-06: qty 20

## 2023-03-06 MED ORDER — ROPIVACAINE HCL 2 MG/ML IJ SOLN
18.0000 mL | Freq: Once | INTRAMUSCULAR | Status: AC
  Administered 2023-03-06: 18 mL via PERINEURAL

## 2023-03-06 NOTE — Addendum Note (Signed)
Addended by: Earlyne Iba on: 03/06/2023 03:50 PM   Modules accepted: Orders

## 2023-03-06 NOTE — Progress Notes (Signed)
Safety precautions to be maintained throughout the outpatient stay will include: orient to surroundings, keep bed in low position, maintain call bell within reach at all times, provide assistance with transfer out of bed and ambulation.  

## 2023-03-06 NOTE — Progress Notes (Signed)
PROVIDER NOTE: Information contained herein reflects review and annotations entered in association with encounter. Interpretation of such information and data should be left to medically-trained personnel. Information provided to patient can be located elsewhere in the medical record under "Patient Instructions". Document created using STT-dictation technology, any transcriptional errors that may result from process are unintentional.    Patient: Brianna Arnold  Service Category: Procedure  Provider: Edward Jolly, MD  DOB: 12-04-1990  DOS: 03/06/2023  Location: [facility]  MRN: [id number]  Setting: Ambulatory - outpatient  Referring Provider: Marguarite Arbour, MD  Type: Established Patient  Specialty: Interventional Pain Management  PCP: Marguarite Arbour, MD   Primary Reason for Visit: Interventional Pain Management Treatment. CC: Back Pain (Entire back ), Neck Pain (Left is worse ), and Shoulder Pain (Right )   Procedure:          Anesthesia, Analgesia, Anxiolysis:  Type: Lumbar Paraspinal Muscle Trigger Point Injection (3+)         Quadratus lumborum, erector spinae, multifidus and periscapular region and bilateral cervical/trapezius TPI CPT: 20553 Primary Purpose: Therapeutic Approach: Percutaneous, ipsilateral approach. Laterality: Midline        Type: Local Anesthesia   Position: Prone   Indications: 1. Chronic pain syndrome   2. Myofascial pain syndrome of lumbar spine   3. Cervicalgia      Pain Score: Pre-procedure: 7 /10 Post-procedure: 0-No pain/10   Pre-op H&P Assessment:  Brianna Arnold is a 32 y.o. (year old), female patient, seen today for interventional treatment. She  has a past surgical history that includes Cervical spine surgery (07/2014); Back surgery (10/2014); and Anterior cervical decomp/discectomy fusion (N/A, 02/11/2019). Brianna Arnold has a current medication list which includes the following prescription(s): albuterol, baclofen,  buprenorphine, vitamin d3, cyanocobalamin, cyclobenzaprine, dicyclomine, ergocalciferol, famotidine, ferrous sulfate, gabapentin, medroxyprogesterone, meloxicam, methocarbamol, ofloxacin, promethazine, propranolol er, tramadol, venlafaxine xr, vitamin b-12, and zolpidem, and the following Facility-Administered Medications: ropivacaine (pf) 2 mg/ml (0.2%). Her primarily concern today is the Back Pain (Entire back ), Neck Pain (Left is worse ), and Shoulder Pain (Right )   Initial Vital Signs:  Pulse/HCG Rate: 84ECG Heart Rate: 83 Temp: 98.6 F (37 C) Resp: 16 BP: (!) 139/93 SpO2: 100 %  BMI: Estimated body mass index is 32.41 kg/m as calculated from the following:   Height as of this encounter: 4' 7.5" (1.41 m).   Weight as of this encounter: 142 lb (64.4 kg).  Risk Assessment: Allergies: Reviewed. She is allergic to hypafix [wound dressings], ativan [lorazepam], and tizanidine.  Allergy Precautions: None required Coagulopathies: Reviewed. None identified.  Blood-thinner therapy: None at this time Active Infection(s): Reviewed. None identified. Brianna Arnold is afebrile  Site Confirmation: Brianna Arnold was asked to confirm the procedure and laterality before marking the site Procedure checklist: Completed Consent: Before the procedure and under the influence of no sedative(s), amnesic(s), or anxiolytics, the patient was informed of the treatment options, risks and possible complications. To fulfill our ethical and legal obligations, as recommended by the American Medical Association's Code of Ethics, I have informed the patient of my clinical impression; the nature and purpose of the treatment or procedure; the risks, benefits, and possible complications of the intervention; the alternatives, including doing nothing; the risk(s) and benefit(s) of the alternative treatment(s) or procedure(s); and the risk(s) and benefit(s) of doing nothing. The patient was provided information about the general  risks and possible complications associated with the procedure. These may include, but are not limited to: failure  to achieve desired goals, infection, bleeding, organ or nerve damage, allergic reactions, paralysis, and death. In addition, the patient was informed of those risks and complications associated to the procedure, such as failure to decrease pain; infection; bleeding; organ or nerve damage with subsequent damage to sensory, motor, and/or autonomic systems, resulting in permanent pain, numbness, and/or weakness of one or several areas of the body; allergic reactions; (i.e.: anaphylactic reaction); and/or death. Furthermore, the patient was informed of those risks and complications associated with the medications. These include, but are not limited to: allergic reactions (i.e.: anaphylactic or anaphylactoid reaction(s)); adrenal axis suppression; blood sugar elevation that in diabetics may result in ketoacidosis or comma; water retention that in patients with history of congestive heart failure may result in shortness of breath, pulmonary edema, and decompensation with resultant heart failure; weight gain; swelling or edema; medication-induced neural toxicity; particulate matter embolism and blood vessel occlusion with resultant organ, and/or nervous system infarction; and/or aseptic necrosis of one or more joints. Finally, the patient was informed that Medicine is not an exact science; therefore, there is also the possibility of unforeseen or unpredictable risks and/or possible complications that may result in a catastrophic outcome. The patient indicated having understood very clearly. We have given the patient no guarantees and we have made no promises. Enough time was given to the patient to ask questions, all of which were answered to the patient's satisfaction. Brianna Arnold has indicated that she wanted to continue with the procedure. Attestation: I, the ordering provider, attest that I have  discussed with the patient the benefits, risks, side-effects, alternatives, likelihood of achieving goals, and potential problems during recovery for the procedure that I have provided informed consent. Date  Time: 03/06/2023  2:07 PM  Pre-Procedure Preparation:  Monitoring: As per clinic protocol. Respiration, ETCO2, SpO2, BP, heart rate and rhythm monitor placed and checked for adequate function Safety Precautions: Patient was assessed for positional comfort and pressure points before starting the procedure. Time-out: I initiated and conducted the "Time-out" before starting the procedure, as per protocol. The patient was asked to participate by confirming the accuracy of the "Time Out" information. Verification of the correct person, site, and procedure were performed and confirmed by me, the nursing staff, and the patient. "Time-out" conducted as per Joint Commission's Universal Protocol (UP.01.01.01). Time: 1447  Description of Procedure:          Area Prepped: Entire Lower Lumbosacral Region & Right cervical region DuraPrep (Iodine Povacrylex [0.7% available iodine] and Isopropyl Alcohol, 74% w/w) Safety Precautions: Aspiration looking for blood return was conducted prior to all injections. At no point did we inject any substances, as a needle was being advanced. No attempts were made at seeking any paresthesias. Safe injection practices and needle disposal techniques used. Medications properly checked for expiration dates. SDV (single dose vial) medications used. Description of the Procedure: Protocol guidelines were followed. The patient was placed in position over the fluoroscopy table. The target area was identified and the area prepped in the usual manner. Skin & deeper tissues infiltrated with local anesthetic. Appropriate amount of time allowed to pass for local anesthetics to take effect. The procedure needles were then advanced to the target area. Proper needle placement secured. Negative  aspiration confirmed. Solution injected in intermittent fashion, asking for systemic symptoms every 0.5cc of injectate. The needles were then removed and the area cleansed, making sure to leave some of the prepping solution back to take advantage of its long term bactericidal properties.  Vitals:  03/06/23 1440 03/06/23 1445 03/06/23 1450 03/06/23 1452  BP: (!) 136/101 (!) 136/104 (!) 145/107 (!) 160/105  Pulse:      Resp: 16 16 18 18   Temp:      TempSrc:      SpO2: 99% 100% 99% 99%  Weight:      Height:            Start Time: 1447 hrs. End Time: 1452 hrs. Materials:  Needle(s) Type: Regular needle Gauge: 27G Length: 1.5-in  Medication(s): Please see orders for medications and dosing details.   15 trigger points were also injected in the right and left cervical, right and left trapezius, & lumbar paraspinal region with dry needling performed.  Each trigger point was injected with 0.5 to 1 cc of 0.2% ropivacaine.   Post-operative Assessment:  Post-procedure Vital Signs:  Pulse/HCG Rate: 8490 Temp: 98.6 F (37 C) Resp: 18 BP: (!) 160/105 SpO2: 99 %  EBL: None  Complications: No immediate post-treatment complications observed by team, or reported by patient.  Note: The patient tolerated the entire procedure well. A repeat set of vitals were taken after the procedure and the patient was kept under observation following institutional policy, for this type of procedure. Post-procedural neurological assessment was performed, showing return to baseline, prior to discharge. The patient was provided with post-procedure discharge instructions, including a section on how to identify potential problems. Should any problems arise concerning this procedure, the patient was given instructions to immediately contact us, at any time, without hesitation. In any case, we plan to contact the patient by telephone for a follow-up status report regarding this interventional  procedure.  Comments:  No additional relevant information.  Plan of Care   Medications ordered for procedure: Meds ordered this encounter  Medications   ropivacaine (PF) 2 mg/mL (0.2%) (NAROPIN) injection 18 mL    No orders of the defined types were placed in this encounter.    Medications administered: Jasmine Pang. [place] "Marcelino Duster" had no medications administered during this visit.  See the medical record for exact dosing, route, and time of administration.  Follow-up plan:   Return in about 1 month (around 04/05/2023) for TPI.       Pharmacological management options:  Opioid Analgesics: Butrans patch, side effects at 7.5 mcg an hour, analgesic benefit with no side effects at 5 mcg an hour, continue  Membrane stabilizer: Continue gabapentin as prescribed  Muscle relaxant: Continue Flexeril as prescribed  NSAID: To be determined at a later time  Other analgesic(s): To be determined at a later time    Interventional management options: Brianna Arnold was informed that there is no guarantee that she would be a candidate for interventional therapies. The decision will be based on the results of diagnostic studies, as well as Brianna Arnold's risk profile.  Procedure(s) hx Left axillary peripheral nerve stimulation: 12/30/19 providing significant pain relief.  Removed 02/29/2020              Recent Visits Date Type Provider Dept  02/26/23 Office Visit Edward Jolly, MD [facility]  01/30/23 Procedure visit Edward Jolly, MD [facility]  01/02/23 Procedure visit Edward Jolly, MD [facility]  Showing recent visits within past 90 days and meeting all other requirements Today's Visits Date Type Provider Dept  03/06/23 Procedure visit Edward Jolly, MD [facility]  Showing today's visits and meeting all other requirements Future Appointments Date Type Provider Dept  04/10/23 Appointment Edward Jolly, MD [facility]  Showing  future  appointments within next 90 days and meeting all other requirements  Disposition: Discharge home  Discharge (Date  Time): 03/06/2023; 1456 hrs.   Primary Care Physician: Marguarite Arbour, MD Location: [facility] Note by: Edward Jolly, MD Date: 03/06/2023; Time: 3:15 PM  Disclaimer:  Medicine is not an exact science. The only guarantee in medicine is that nothing is guaranteed. It is important to note that the decision to proceed with this intervention was based on the information collected from the patient. The Data and conclusions were drawn from the patient's questionnaire, the interview, and the physical examination. Because the information was provided in large part by the patient, it cannot be guaranteed that it has not been purposely or unconsciously manipulated. Every effort has been made to obtain as much relevant data as possible for this evaluation. It is important to note that the conclusions that lead to this procedure are derived in large part from the available data. Always take into account that the treatment will also be dependent on availability of resources and existing treatment guidelines, considered by other Pain Management Practitioners as being common knowledge and practice, at the time of the intervention. For Medico-Legal purposes, it is also important to point out that variation in procedural techniques and pharmacological choices are the acceptable norm. The indications, contraindications, technique, and results of the above procedure should only be interpreted and judged by a Board-Certified Interventional Pain Specialist with extensive familiarity and expertise in the same exact procedure and technique.

## 2023-03-06 NOTE — Patient Instructions (Signed)

## 2023-03-20 DIAGNOSIS — Z3042 Encounter for surveillance of injectable contraceptive: Secondary | ICD-10-CM | POA: Diagnosis not present

## 2023-04-01 DIAGNOSIS — G894 Chronic pain syndrome: Secondary | ICD-10-CM | POA: Diagnosis not present

## 2023-04-01 DIAGNOSIS — M4105 Infantile idiopathic scoliosis, thoracolumbar region: Secondary | ICD-10-CM | POA: Diagnosis not present

## 2023-04-01 DIAGNOSIS — Z981 Arthrodesis status: Secondary | ICD-10-CM | POA: Diagnosis not present

## 2023-04-02 DIAGNOSIS — H52223 Regular astigmatism, bilateral: Secondary | ICD-10-CM | POA: Diagnosis not present

## 2023-04-02 DIAGNOSIS — Z0100 Encounter for examination of eyes and vision without abnormal findings: Secondary | ICD-10-CM | POA: Diagnosis not present

## 2023-04-10 ENCOUNTER — Ambulatory Visit
Payer: Medicare HMO | Attending: Student in an Organized Health Care Education/Training Program | Admitting: Student in an Organized Health Care Education/Training Program

## 2023-04-10 ENCOUNTER — Encounter: Payer: Self-pay | Admitting: Student in an Organized Health Care Education/Training Program

## 2023-04-10 VITALS — BP 122/91 | HR 100 | Temp 98.3°F | Resp 16 | Ht <= 58 in | Wt 146.0 lb

## 2023-04-10 DIAGNOSIS — M542 Cervicalgia: Secondary | ICD-10-CM | POA: Diagnosis not present

## 2023-04-10 DIAGNOSIS — M4105 Infantile idiopathic scoliosis, thoracolumbar region: Secondary | ICD-10-CM | POA: Insufficient documentation

## 2023-04-10 DIAGNOSIS — G894 Chronic pain syndrome: Secondary | ICD-10-CM | POA: Diagnosis not present

## 2023-04-10 DIAGNOSIS — R8290 Unspecified abnormal findings in urine: Secondary | ICD-10-CM | POA: Diagnosis not present

## 2023-04-10 DIAGNOSIS — E559 Vitamin D deficiency, unspecified: Secondary | ICD-10-CM | POA: Diagnosis not present

## 2023-04-10 DIAGNOSIS — Z79899 Other long term (current) drug therapy: Secondary | ICD-10-CM | POA: Diagnosis not present

## 2023-04-10 DIAGNOSIS — E538 Deficiency of other specified B group vitamins: Secondary | ICD-10-CM | POA: Diagnosis not present

## 2023-04-10 DIAGNOSIS — M7918 Myalgia, other site: Secondary | ICD-10-CM | POA: Insufficient documentation

## 2023-04-10 DIAGNOSIS — I10 Essential (primary) hypertension: Secondary | ICD-10-CM | POA: Diagnosis not present

## 2023-04-10 DIAGNOSIS — Z0000 Encounter for general adult medical examination without abnormal findings: Secondary | ICD-10-CM | POA: Diagnosis not present

## 2023-04-10 DIAGNOSIS — Z13220 Encounter for screening for lipoid disorders: Secondary | ICD-10-CM | POA: Diagnosis not present

## 2023-04-10 MED ORDER — ROPIVACAINE HCL 2 MG/ML IJ SOLN
18.0000 mL | Freq: Once | INTRAMUSCULAR | Status: AC
  Administered 2023-04-10: 18 mL via PERINEURAL

## 2023-04-10 MED ORDER — ROPIVACAINE HCL 2 MG/ML IJ SOLN
INTRAMUSCULAR | Status: AC
  Filled 2023-04-10: qty 20

## 2023-04-10 MED ORDER — DIAZEPAM 5 MG PO TABS
ORAL_TABLET | ORAL | Status: AC
  Filled 2023-04-10: qty 1

## 2023-04-10 MED ORDER — DIAZEPAM 5 MG PO TABS
5.0000 mg | ORAL_TABLET | ORAL | Status: AC
  Administered 2023-04-10: 5 mg via ORAL

## 2023-04-10 NOTE — Progress Notes (Signed)
PROVIDER NOTE: Information contained herein reflects review and annotations entered in association with encounter. Interpretation of such information and data should be left to medically-trained personnel. Information provided to patient can be located elsewhere in the medical record under "Patient Instructions". Document created using STT-dictation technology, any transcriptional errors that may result from process are unintentional.    Patient: Brianna Arnold  Service Category: Procedure  Provider: Edward Jolly, MD  DOB: Jan 09, 1991  DOS: 04/10/2023  Location: [facility]  MRN: [id number]  Setting: Ambulatory - outpatient  Referring Provider: Marguarite Arbour, MD  Type: Established Patient  Specialty: Interventional Pain Management  PCP: Marguarite Arbour, MD   Primary Reason for Visit: Interventional Pain Management Treatment. CC: Back Pain   Procedure:          Anesthesia, Analgesia, Anxiolysis:  Type: Lumbar Paraspinal Muscle Trigger Point Injection (3+)         Quadratus lumborum, erector spinae, multifidus and periscapular region and bilateral cervical/trapezius TPI CPT: 20553 Primary Purpose: Therapeutic Approach: Percutaneous, ipsilateral approach. Laterality: Midline        Type: Local Anesthesia   Position: Prone   Indications: 1. Chronic pain syndrome   2. Myofascial pain syndrome of lumbar spine   3. Cervicalgia   4. Infantile idiopathic scoliosis of thoracolumbar region       Pain Score: Pre-procedure: 7 /10 Post-procedure: 7 /10   Pre-op H&P Assessment:  Brianna Arnold is a 32 y.o. (year old), female patient, seen today for interventional treatment. She  has a past surgical history that includes Cervical spine surgery (07/2014); Back surgery (10/2014); and Anterior cervical decomp/discectomy fusion (N/A, 02/11/2019). Brianna Arnold has a current medication list which includes the following prescription(s): albuterol, baclofen, buprenorphine, vitamin  d3, ciprofloxacin, cyanocobalamin, cyclobenzaprine, dicyclomine, ergocalciferol, famotidine, ferrous sulfate, gabapentin, medroxyprogesterone, meloxicam, methocarbamol, ofloxacin, prednisone, promethazine, propranolol er, tramadol, venlafaxine xr, vitamin b-12, and zolpidem. Her primarily concern today is the Back Pain   Initial Vital Signs:  Pulse/HCG Rate: 100  Temp: 98.3 F (36.8 C) Resp: 16 BP: (!) 122/91 SpO2: 98 %  BMI: Estimated body mass index is 33.32 kg/m as calculated from the following:   Height as of this encounter: 4' 7.5" (1.41 m).   Weight as of this encounter: 146 lb (66.2 kg).  Risk Assessment: Allergies: Reviewed. She is allergic to hypafix [wound dressings], ativan [lorazepam], and tizanidine.  Allergy Precautions: None required Coagulopathies: Reviewed. None identified.  Blood-thinner therapy: None at this time Active Infection(s): Reviewed. None identified. Brianna Arnold is afebrile  Site Confirmation: Brianna Arnold was asked to confirm the procedure and laterality before marking the site Procedure checklist: Completed Consent: Before the procedure and under the influence of no sedative(s), amnesic(s), or anxiolytics, the patient was informed of the treatment options, risks and possible complications. To fulfill our ethical and legal obligations, as recommended by the American Medical Association's Code of Ethics, I have informed the patient of my clinical impression; the nature and purpose of the treatment or procedure; the risks, benefits, and possible complications of the intervention; the alternatives, including doing nothing; the risk(s) and benefit(s) of the alternative treatment(s) or procedure(s); and the risk(s) and benefit(s) of doing nothing. The patient was provided information about the general risks and possible complications associated with the procedure. These may include, but are not limited to: failure to achieve desired goals, infection, bleeding, organ or  nerve damage, allergic reactions, paralysis, and death. In addition, the patient was informed of those risks and complications associated to  the procedure, such as failure to decrease pain; infection; bleeding; organ or nerve damage with subsequent damage to sensory, motor, and/or autonomic systems, resulting in permanent pain, numbness, and/or weakness of one or several areas of the body; allergic reactions; (i.e.: anaphylactic reaction); and/or death. Furthermore, the patient was informed of those risks and complications associated with the medications. These include, but are not limited to: allergic reactions (i.e.: anaphylactic or anaphylactoid reaction(s)); adrenal axis suppression; blood sugar elevation that in diabetics may result in ketoacidosis or comma; water retention that in patients with history of congestive heart failure may result in shortness of breath, pulmonary edema, and decompensation with resultant heart failure; weight gain; swelling or edema; medication-induced neural toxicity; particulate matter embolism and blood vessel occlusion with resultant organ, and/or nervous system infarction; and/or aseptic necrosis of one or more joints. Finally, the patient was informed that Medicine is not an exact science; therefore, there is also the possibility of unforeseen or unpredictable risks and/or possible complications that may result in a catastrophic outcome. The patient indicated having understood very clearly. We have given the patient no guarantees and we have made no promises. Enough time was given to the patient to ask questions, all of which were answered to the patient's satisfaction. Brianna Arnold has indicated that she wanted to continue with the procedure. Attestation: I, the ordering provider, attest that I have discussed with the patient the benefits, risks, side-effects, alternatives, likelihood of achieving goals, and potential problems during recovery for the procedure that I have  provided informed consent. Date  Time: 04/10/2023  1:57 PM  Pre-Procedure Preparation:  Monitoring: As per clinic protocol. Respiration, ETCO2, SpO2, BP, heart rate and rhythm monitor placed and checked for adequate function Safety Precautions: Patient was assessed for positional comfort and pressure points before starting the procedure. Time-out: I initiated and conducted the "Time-out" before starting the procedure, as per protocol. The patient was asked to participate by confirming the accuracy of the "Time Out" information. Verification of the correct person, site, and procedure were performed and confirmed by me, the nursing staff, and the patient. "Time-out" conducted as per Joint Commission's Universal Protocol (UP.01.01.01). Time: 1422  Description of Procedure:          Area Prepped: Entire Lower Lumbosacral Region & Right cervical region DuraPrep (Iodine Povacrylex [0.7% available iodine] and Isopropyl Alcohol, 74% w/w) Safety Precautions: Aspiration looking for blood return was conducted prior to all injections. At no point did we inject any substances, as a needle was being advanced. No attempts were made at seeking any paresthesias. Safe injection practices and needle disposal techniques used. Medications properly checked for expiration dates. SDV (single dose vial) medications used. Description of the Procedure: Protocol guidelines were followed. The patient was placed in position over the fluoroscopy table. The target area was identified and the area prepped in the usual manner. Skin & deeper tissues infiltrated with local anesthetic. Appropriate amount of time allowed to pass for local anesthetics to take effect. The procedure needles were then advanced to the target area. Proper needle placement secured. Negative aspiration confirmed. Solution injected in intermittent fashion, asking for systemic symptoms every 0.5cc of injectate. The needles were then removed and the area cleansed,  making sure to leave some of the prepping solution back to take advantage of its long term bactericidal properties.  Vitals:   04/10/23 1405  BP: (!) 122/91  Pulse: 100  Resp: 16  Temp: 98.3 F (36.8 C)  TempSrc: Temporal  SpO2: 98%  Weight:  146 lb (66.2 kg)  Height: 4' 7.5" (1.41 m)        Start Time: 1422 hrs. End Time: 1425 hrs. Materials:  Needle(s) Type: Regular needle Gauge: 27G Length: 1.5-in  Medication(s): Please see orders for medications and dosing details.   15 trigger points were also injected in the right and left cervical, right and left trapezius, & lumbar paraspinal region with dry needling performed.  Each trigger point was injected with 0.5 to 1 cc of 0.2% ropivacaine.   Post-operative Assessment:  Post-procedure Vital Signs:  Pulse/HCG Rate: 100  Temp: 98.3 F (36.8 C) Resp: 16 BP: (!) 122/91 SpO2: 98 %  EBL: None  Complications: No immediate post-treatment complications observed by team, or reported by patient.  Note: The patient tolerated the entire procedure well. A repeat set of vitals were taken after the procedure and the patient was kept under observation following institutional policy, for this type of procedure. Post-procedural neurological assessment was performed, showing return to baseline, prior to discharge. The patient was provided with post-procedure discharge instructions, including a section on how to identify potential problems. Should any problems arise concerning this procedure, the patient was given instructions to immediately contact us, at any time, without hesitation. In any case, we plan to contact the patient by telephone for a follow-up status report regarding this interventional procedure.  Comments:  No additional relevant information.  Plan of Care   Medications ordered for procedure: Meds ordered this encounter  Medications   diazepam (VALIUM) tablet 5 mg    Make sure Flumazenil is available in the pyxis when  using this medication. If oversedation occurs, administer 0.2 mg IV over 15 sec. If after 45 sec no response, administer 0.2 mg again over 1 min; may repeat at 1 min intervals; not to exceed 4 doses (1 mg)   ropivacaine (PF) 2 mg/mL (0.2%) (NAROPIN) injection 18 mL    No orders of the defined types were placed in this encounter.    Medications administered: We administered diazepam and ropivacaine (PF) 2 mg/mL (0.2%).  See the medical record for exact dosing, route, and time of administration.  Follow-up plan:   Return in about 3 months (around 07/09/2023).       Pharmacological management options:  Opioid Analgesics: Butrans patch, side effects at 7.5 mcg an hour, analgesic benefit with no side effects at 5 mcg an hour, continue  Membrane stabilizer: Continue gabapentin as prescribed  Muscle relaxant: Continue Flexeril as prescribed  NSAID: To be determined at a later time  Other analgesic(s): To be determined at a later time    Interventional management options: Ms. Altes was informed that there is no guarantee that she would be a candidate for interventional therapies. The decision will be based on the results of diagnostic studies, as well as Ms. Gillaspie's risk profile.  Procedure(s) hx Left axillary peripheral nerve stimulation: 12/30/19 providing significant pain relief.  Removed 02/29/2020              Recent Visits Date Type Provider Dept  03/06/23 Procedure visit Edward Jolly, MD [facility]  02/26/23 Office Visit Edward Jolly, MD [facility]  01/30/23 Procedure visit Edward Jolly, MD [facility]  Showing recent visits within past 90 days and meeting all other requirements Today's Visits Date Type Provider Dept  04/10/23 Procedure visit Edward Jolly, MD [facility]  Showing today's visits and meeting all other requirements Future Appointments Date Type Provider Dept  05/08/23 Appointment Edward Jolly, MD  [facility]   06/25/23 Appointment Edward Jolly, MD [facility]  Showing future appointments within next 90 days and meeting all other requirements  Disposition: Discharge home  Discharge (Date  Time): 04/10/2023; 1429 hrs.   Primary Care Physician: Marguarite Arbour, MD Location: [facility] Note by: Edward Jolly, MD Date: 04/10/2023; Time: 2:44 PM  Disclaimer:  Medicine is not an exact science. The only guarantee in medicine is that nothing is guaranteed. It is important to note that the decision to proceed with this intervention was based on the information collected from the patient. The Data and conclusions were drawn from the patient's questionnaire, the interview, and the physical examination. Because the information was provided in large part by the patient, it cannot be guaranteed that it has not been purposely or unconsciously manipulated. Every effort has been made to obtain as much relevant data as possible for this evaluation. It is important to note that the conclusions that lead to this procedure are derived in large part from the available data. Always take into account that the treatment will also be dependent on availability of resources and existing treatment guidelines, considered by other Pain Management Practitioners as being common knowledge and practice, at the time of the intervention. For Medico-Legal purposes, it is also important to point out that variation in procedural techniques and pharmacological choices are the acceptable norm. The indications, contraindications, technique, and results of the above procedure should only be interpreted and judged by a Board-Certified Interventional Pain Specialist with extensive familiarity and expertise in the same exact procedure and technique.

## 2023-04-10 NOTE — Progress Notes (Signed)
Safety precautions to be maintained throughout the outpatient stay will include: orient to surroundings, keep bed in low position, maintain call bell within reach at all times, provide assistance with transfer out of bed and ambulation.  

## 2023-04-10 NOTE — Patient Instructions (Addendum)
Trigger Point Injection Trigger points are areas where you have pain. A trigger point injection is a shot given in the trigger point to help relieve pain for a few days to a few months. Common places for trigger points include the neck, shoulders, upper back, or lower back. A trigger point injection will not cure long-term (chronic) pain permanently. These injections do not always work for every person. For some people, they can help to relieve pain for a few days to a few months. Tell a health care provider about: Any allergies you have. All medicines you are taking, including vitamins, herbs, eye drops, creams, and over-the-counter medicines. Any problems you or family members have had with anesthetic medicines. Any bleeding problems you have. Any surgeries you have had. Any medical conditions you have. Whether you are pregnant or may be pregnant. What are the risks? Generally, this is a safe procedure. However, problems may occur, including: Infection. Bleeding or bruising. Allergic reaction to the injected medicine. Irritation of the skin around the injection site. What happens before the procedure? Ask your health care provider about: Changing or stopping your regular medicines. This is especially important if you are taking diabetes medicines or blood thinners. Taking medicines such as aspirin and ibuprofen. These medicines can thin your blood. Do not take these medicines unless your health care provider tells you to take them. Taking over-the-counter medicines, vitamins, herbs, and supplements. What happens during the procedure?  Your health care provider will feel for trigger points. A marker may be used to circle the area for the injection. The skin over the trigger point will be washed with a germ-killing soap. You may be given a medicine to help you relax (sedative). A thin needle is used for the injection. You may feel pain or a twitching feeling when the needle enters your  skin. A numbing solution may be injected into the trigger point. Sometimes a medicine to keep down inflammation is also injected. Your health care provider may move the needle around the area where the trigger point is located until the tightness and twitching goes away. After the injection, your health care provider may put gentle pressure over the injection site. The injection site will be covered with a bandage (dressing). The procedure may vary among health care providers and hospitals. What can I expect after treatment? After treatment, you may have soreness and stiffness for 1-2 days. Follow these instructions at home: Injection site care Remove your dressing in a few hours, or as told by your health care provider. Check your injection site every day for signs of infection. Check for: Redness, swelling, or pain. Fluid or blood. Warmth. Pus or a bad smell. Managing pain, stiffness, and swelling If directed, put ice on the affected area. To do this: Put ice in a plastic bag. Place a towel between your skin and the bag. Leave the ice on for 20 minutes, 2-3 times a day. Remove the ice if your skin turns bright red. This is very important. If you cannot feel pain, heat, or cold, you have a greater risk of damage to the area. Activity If you were given a sedative during the procedure, it can affect you for several hours. Do not drive or operate machinery until your health care provider says that it is safe. Do not take baths, swim, or use a hot tub until your health care provider approves. Return to your normal activities as told by your health care provider. Ask your health care provider what  activities are safe for you. General instructions If you were asked to stop your regular medicines, ask your health care provider when you may start taking them again. You may be asked to see an occupational or physical therapist for exercises that reduce muscle strain and stretch the area of the  trigger point. Keep all follow-up visits. This is important. Contact a health care provider if: Your pain comes back, and it is worse than before the injection. You may need more injections. You have chills or a fever. The injection site becomes more painful, red, swollen, or warm to the touch. Summary A trigger point injection is a shot given in the trigger point to help relieve pain. Common places for trigger point injections are the neck, shoulders, upper back, and lower back. These injections do not always work for every person, but for some people, the injections can help to relieve pain for a few days to a few months. Contact a health care provider if symptoms come back or if they are worse than before treatment. Also, get help if the injection site becomes more painful, red, swollen, or warm to the touch. This information is not intended to replace advice given to you by your health care provider. Make sure you discuss any questions you have with your health care provider. Document Revised: 07/19/2020 Document Reviewed: 07/19/2020 Elsevier Patient Education  2024 [employer]  ______________________________________________________________________    Post-Procedure Discharge Instructions  Instructions: Apply ice:  Purpose: This will minimize any swelling and discomfort after procedure.  When: Day of procedure, as soon as you get home. How: Fill a plastic sandwich bag with crushed ice. Cover it with a small towel and apply to injection site. How long: (15 min on, 15 min off) Apply for 15 minutes then remove x 15 minutes.  Repeat sequence on day of procedure, until you go to bed. Apply heat:  Purpose: To treat any soreness and discomfort from the procedure. When: Starting the next day after the procedure. How: Apply heat to procedure site starting the day following the procedure. How long: May continue to repeat daily, until discomfort goes away. Food intake: Start with clear  liquids (like water) and advance to regular food, as tolerated.  Physical activities: Keep activities to a minimum for the first 8 hours after the procedure. After that, then as tolerated. Driving: If you have received any sedation, be responsible and do not drive. You are not allowed to drive for 24 hours after having sedation. Blood thinner: (Applies only to those taking blood thinners) You may restart your blood thinner 6 hours after your procedure. Insulin: (Applies only to Diabetic patients taking insulin) As soon as you can eat, you may resume your normal dosing schedule. Infection prevention: Keep procedure site clean and dry. Shower daily and clean area with soap and water. Post-procedure Pain Diary: Extremely important that this be done correctly and accurately. Recorded information will be used to determine the next step in treatment. For the purpose of accuracy, follow these rules: Evaluate only the area treated. Do not report or include pain from an untreated area. For the purpose of this evaluation, ignore all other areas of pain, except for the treated area. After your procedure, avoid taking a long nap and attempting to complete the pain diary after you wake up. Instead, set your alarm clock to go off every hour, on the hour, for the initial 8 hours after the procedure. Document the duration of the numbing medicine, and the relief  you are getting from it. Do not go to sleep and attempt to complete it later. It will not be accurate. If you received sedation, it is likely that you were given a medication that may cause amnesia. Because of this, completing the diary at a later time may cause the information to be inaccurate. This information is needed to plan your care. Follow-up appointment: Keep your post-procedure follow-up evaluation appointment after the procedure (usually 2 weeks for most procedures, 6 weeks for radiofrequencies). DO NOT FORGET to bring you pain diary with you.    Expect: (What should I expect to see with my procedure?) From numbing medicine (AKA: Local Anesthetics): Numbness or decrease in pain. You may also experience some weakness, which if present, could last for the duration of the local anesthetic. Onset: Full effect within 15 minutes of injected. Duration: It will depend on the type of local anesthetic used. On the average, 1 to 8 hours.  From steroids (Applies only if steroids were used): Decrease in swelling or inflammation. Once inflammation is improved, relief of the pain will follow. Onset of benefits: Depends on the amount of swelling present. The more swelling, the longer it will take for the benefits to be seen. In some cases, up to 10 days. Duration: Steroids will stay in the system x 2 weeks. Duration of benefits will depend on multiple posibilities including persistent irritating factors. Side-effects: If present, they may typically last 2 weeks (the duration of the steroids). Frequent: Cramps (if they occur, drink Gatorade and take over-the-counter Magnesium 450-500 mg once to twice a day); water retention with temporary weight gain; increases in blood sugar; decreased immune system response; increased appetite. Occasional: Facial flushing (red, warm cheeks); mood swings; menstrual changes. Uncommon: Long-term decrease or suppression of natural hormones; bone thinning. (These are more common with higher doses or more frequent use. This is why we prefer that our patients avoid having any injection therapies in other practices.)  Very Rare: Severe mood changes; psychosis; aseptic necrosis. From procedure: Some discomfort is to be expected once the numbing medicine wears off. This should be minimal if ice and heat are applied as instructed.  Call if: (When should I call?) You experience numbness and weakness that gets worse with time, as opposed to wearing off. New onset bowel or bladder incontinence. (Applies only to procedures done in the  spine)  Emergency Numbers: Durning business hours (Monday - Thursday, 8:00 AM - 4:00 PM) (Friday, 9:00 AM - 12:00 Noon): [phone] After hours: [phone] NOTE: If you are having a problem and are unable connect with, or to talk to a provider, then go to your nearest urgent care or emergency department. If the problem is serious and urgent, please call 911. ______________________________________________________________________

## 2023-05-02 DIAGNOSIS — M4105 Infantile idiopathic scoliosis, thoracolumbar region: Secondary | ICD-10-CM | POA: Diagnosis not present

## 2023-05-02 DIAGNOSIS — G894 Chronic pain syndrome: Secondary | ICD-10-CM | POA: Diagnosis not present

## 2023-05-02 DIAGNOSIS — Z981 Arthrodesis status: Secondary | ICD-10-CM | POA: Diagnosis not present

## 2023-05-07 DIAGNOSIS — R070 Pain in throat: Secondary | ICD-10-CM | POA: Diagnosis not present

## 2023-05-07 DIAGNOSIS — J351 Hypertrophy of tonsils: Secondary | ICD-10-CM | POA: Diagnosis not present

## 2023-05-07 DIAGNOSIS — J3501 Chronic tonsillitis: Secondary | ICD-10-CM | POA: Diagnosis not present

## 2023-05-07 DIAGNOSIS — J0301 Acute recurrent streptococcal tonsillitis: Secondary | ICD-10-CM | POA: Diagnosis not present

## 2023-05-08 ENCOUNTER — Encounter: Payer: Self-pay | Admitting: Student in an Organized Health Care Education/Training Program

## 2023-05-08 ENCOUNTER — Ambulatory Visit
Payer: Medicare HMO | Attending: Student in an Organized Health Care Education/Training Program | Admitting: Student in an Organized Health Care Education/Training Program

## 2023-05-08 VITALS — BP 137/94 | HR 85 | Temp 97.3°F | Resp 16 | Ht <= 58 in | Wt 150.0 lb

## 2023-05-08 DIAGNOSIS — M7918 Myalgia, other site: Secondary | ICD-10-CM

## 2023-05-08 DIAGNOSIS — G894 Chronic pain syndrome: Secondary | ICD-10-CM

## 2023-05-08 DIAGNOSIS — M542 Cervicalgia: Secondary | ICD-10-CM | POA: Diagnosis not present

## 2023-05-08 MED ORDER — DIAZEPAM 5 MG PO TABS
ORAL_TABLET | ORAL | Status: AC
  Filled 2023-05-08: qty 1

## 2023-05-08 MED ORDER — DIAZEPAM 5 MG PO TABS
5.0000 mg | ORAL_TABLET | ORAL | Status: AC
  Administered 2023-05-08: 5 mg via ORAL

## 2023-05-08 MED ORDER — ROPIVACAINE HCL 2 MG/ML IJ SOLN
18.0000 mL | Freq: Once | INTRAMUSCULAR | Status: AC
  Administered 2023-05-08: 18 mL via PERINEURAL
  Filled 2023-05-08: qty 20

## 2023-05-08 NOTE — Patient Instructions (Signed)

## 2023-05-08 NOTE — Progress Notes (Signed)
 PROVIDER NOTE: Information contained herein reflects review and annotations entered in association with encounter. Interpretation of such information and data should be left to medically-trained personnel. Information provided to patient can be located elsewhere in the medical record under "Patient Instructions". Document created using STT-dictation technology, any transcriptional errors that may result from process are unintentional.    Patient: Brianna Arnold  Service Category: Procedure  Provider: Cephus Collin, MD  DOB: 10-Jan-1991  DOS: 05/08/2023  Location: [facility]  MRN: [id number]  Setting: Ambulatory - outpatient  Referring Provider: Yehuda Helms, MD  Type: Established Patient  Specialty: Interventional Pain Management  PCP: Yehuda Helms, MD   Primary Reason for Visit: Interventional Pain Management Treatment. CC: back pain  Procedure:          Anesthesia, Analgesia, Anxiolysis:  Type: Lumbar Paraspinal Muscle Trigger Point Injection (3+)         Quadratus lumborum, erector spinae, multifidus and periscapular region and bilateral cervical/trapezius TPI CPT: 20553 Primary Purpose: Therapeutic Approach: Percutaneous, ipsilateral approach. Laterality: Midline        Type: Local Anesthesia   Position: Prone   Indications: 1. Chronic pain syndrome   2. Myofascial pain syndrome of lumbar spine   3. Cervicalgia       Pain Score: Pre-procedure: 6 /10 Post-procedure: 6 /10   Pre-op H&P Assessment:  Brianna Arnold is a 33 y.o. (year old), female patient, seen today for interventional treatment. She  has a past surgical history that includes Cervical spine surgery (07/2014); Back surgery (10/2014); and Anterior cervical decomp/discectomy fusion (N/A, 02/11/2019). Brianna Arnold has a current medication list which includes the following prescription(s): albuterol, baclofen, buprenorphine , vitamin d3, ciprofloxacin, cyanocobalamin , cyclobenzaprine ,  dicyclomine, ergocalciferol, famotidine , ferrous sulfate , gabapentin , medroxyprogesterone , meloxicam , methocarbamol , ofloxacin, prednisone, promethazine , propranolol er, tramadol , venlafaxine xr, vitamin b-12, and zolpidem. Her primarily concern today is the No chief complaint on file.   Initial Vital Signs:  Pulse/HCG Rate: 85ECG Heart Rate: 80 Temp: (!) 97.3 F (36.3 C) Resp: 16 BP: 124/83 SpO2: 100 %  BMI: Estimated body mass index is 34.24 kg/m as calculated from the following:   Height as of this encounter: 4' 7.5" (1.41 m).   Weight as of this encounter: 150 lb (68 kg).  Risk Assessment: Allergies: Reviewed. She is allergic to hypafix [wound dressings], ativan  [lorazepam ], and tizanidine.  Allergy Precautions: None required Coagulopathies: Reviewed. None identified.  Blood-thinner therapy: None at this time Active Infection(s): Reviewed. None identified. Brianna Arnold is afebrile  Site Confirmation: Brianna Arnold was asked to confirm the procedure and laterality before marking the site Procedure checklist: Completed Consent: Before the procedure and under the influence of no sedative(s), amnesic(s), or anxiolytics, the patient was informed of the treatment options, risks and possible complications. To fulfill our ethical and legal obligations, as recommended by the American Medical Association's Code of Ethics, I have informed the patient of my clinical impression; the nature and purpose of the treatment or procedure; the risks, benefits, and possible complications of the intervention; the alternatives, including doing nothing; the risk(s) and benefit(s) of the alternative treatment(s) or procedure(s); and the risk(s) and benefit(s) of doing nothing. The patient was provided information about the general risks and possible complications associated with the procedure. These may include, but are not limited to: failure to achieve desired goals, infection, bleeding, organ or nerve damage,  allergic reactions, paralysis, and death. In addition, the patient was informed of those risks and complications associated to the procedure, such as failure  to decrease pain; infection; bleeding; organ or nerve damage with subsequent damage to sensory, motor, and/or autonomic systems, resulting in permanent pain, numbness, and/or weakness of one or several areas of the body; allergic reactions; (i.e.: anaphylactic reaction); and/or death. Furthermore, the patient was informed of those risks and complications associated with the medications. These include, but are not limited to: allergic reactions (i.e.: anaphylactic or anaphylactoid reaction(s)); adrenal axis suppression; blood sugar elevation that in diabetics may result in ketoacidosis or comma; water retention that in patients with history of congestive heart failure may result in shortness of breath, pulmonary edema, and decompensation with resultant heart failure; weight gain; swelling or edema; medication-induced neural toxicity; particulate matter embolism and blood vessel occlusion with resultant organ, and/or nervous system infarction; and/or aseptic necrosis of one or more joints. Finally, the patient was informed that Medicine is not an exact science; therefore, there is also the possibility of unforeseen or unpredictable risks and/or possible complications that may result in a catastrophic outcome. The patient indicated having understood very clearly. We have given the patient no guarantees and we have made no promises. Enough time was given to the patient to ask questions, all of which were answered to the patient's satisfaction. Brianna Arnold has indicated that she wanted to continue with the procedure. Attestation: I, the ordering provider, attest that I have discussed with the patient the benefits, risks, side-effects, alternatives, likelihood of achieving goals, and potential problems during recovery for the procedure that I have provided  informed consent. Date  Time: 05/08/2023  1:40 PM  Pre-Procedure Preparation:  Monitoring: As per clinic protocol. Respiration, ETCO2, SpO2, BP, heart rate and rhythm monitor placed and checked for adequate function Safety Precautions: Patient was assessed for positional comfort and pressure points before starting the procedure. Time-out: I initiated and conducted the "Time-out" before starting the procedure, as per protocol. The patient was asked to participate by confirming the accuracy of the "Time Out" information. Verification of the correct person, site, and procedure were performed and confirmed by me, the nursing staff, and the patient. "Time-out" conducted as per Joint Commission's Universal Protocol (UP.01.01.01). Time: 1439  Description of Procedure:          Area Prepped: Entire Lower Lumbosacral Region & Right cervical region DuraPrep (Iodine Povacrylex [0.7% available iodine] and Isopropyl Alcohol, 74% w/w) Safety Precautions: Aspiration looking for blood return was conducted prior to all injections. At no point did we inject any substances, as a needle was being advanced. No attempts were made at seeking any paresthesias. Safe injection practices and needle disposal techniques used. Medications properly checked for expiration dates. SDV (single dose vial) medications used. Description of the Procedure: Protocol guidelines were followed. The patient was placed in position over the fluoroscopy table. The target area was identified and the area prepped in the usual manner. Skin & deeper tissues infiltrated with local anesthetic. Appropriate amount of time allowed to pass for local anesthetics to take effect. The procedure needles were then advanced to the target area. Proper needle placement secured. Negative aspiration confirmed. Solution injected in intermittent fashion, asking for systemic symptoms every 0.5cc of injectate. The needles were then removed and the area cleansed, making sure  to leave some of the prepping solution back to take advantage of its long term bactericidal properties.  Vitals:   05/08/23 1355 05/08/23 1438 05/08/23 1444  BP: 124/83 (!) 137/101 (!) 137/94  Pulse: 85    Resp: 16 18 16   Temp: (!) 97.3 F (36.3 C)  TempSrc: Temporal    SpO2: 100% 100% 100%  Weight: 150 lb (68 kg)    Height: 4' 7.5" (1.41 m)          Start Time: 1439 hrs. End Time: 1444 hrs. Materials:  Needle(s) Type: Regular needle Gauge: 27G Length: 1.5-in  Medication(s): Please see orders for medications and dosing details.   15 trigger points were also injected in the right and left cervical, right and left trapezius, & lumbar paraspinal region with dry needling performed.  Each trigger point was injected with 0.5 to 1 cc of 0.2% ropivacaine .   Post-operative Assessment:  Post-procedure Vital Signs:  Pulse/HCG Rate: 8578 Temp: (!) 97.3 F (36.3 C) Resp: 16 BP: (!) 137/94 SpO2: 100 %  EBL: None  Complications: No immediate post-treatment complications observed by team, or reported by patient.  Note: The patient tolerated the entire procedure well. A repeat set of vitals were taken after the procedure and the patient was kept under observation following institutional policy, for this type of procedure. Post-procedural neurological assessment was performed, showing return to baseline, prior to discharge. The patient was provided with post-procedure discharge instructions, including a section on how to identify potential problems. Should any problems arise concerning this procedure, the patient was given instructions to immediately contact us , at any time, without hesitation. In any case, we plan to contact the patient by telephone for a follow-up status report regarding this interventional procedure.  Comments:  No additional relevant information.  Plan of Care   Medications ordered for procedure: Meds ordered this encounter  Medications   diazepam  (VALIUM )  tablet 5 mg    Make sure Flumazenil is available in the pyxis when using this medication. If oversedation occurs, administer 0.2 mg IV over 15 sec. If after 45 sec no response, administer 0.2 mg again over 1 min; may repeat at 1 min intervals; not to exceed 4 doses (1 mg)   ropivacaine  (PF) 2 mg/mL (0.2%) (NAROPIN ) injection 18 mL    No orders of the defined types were placed in this encounter.    Medications administered: We administered diazepam  and ropivacaine  (PF) 2 mg/mL (0.2%).  See the medical record for exact dosing, route, and time of administration.  Follow-up plan:   No follow-ups on file.       Pharmacological management options:  Opioid Analgesics: Butrans  patch, side effects at 7.5 mcg an hour, analgesic benefit with no side effects at 5 mcg an hour, continue  Membrane stabilizer: Continue gabapentin  as prescribed  Muscle relaxant: Continue Flexeril  as prescribed  NSAID: To be determined at a later time  Other analgesic(s): To be determined at a later time    Interventional management options: Ms. Avitia was informed that there is no guarantee that she would be a candidate for interventional therapies. The decision will be based on the results of diagnostic studies, as well as Ms. Neumann's risk profile.  Procedure(s) hx Left axillary peripheral nerve stimulation: 12/30/19 providing significant pain relief.  Removed 02/29/2020              Recent Visits Date Type Provider Dept  04/10/23 Procedure visit Cephus Collin, MD [facility]  03/06/23 Procedure visit Cephus Collin, MD [facility]  02/26/23 Office Visit Cephus Collin, MD [facility]  Showing recent visits within past 90 days and meeting all other requirements Today's Visits Date Type Provider Dept  05/08/23 Procedure visit Cephus Collin, MD [facility]  Showing today's visits and meeting all other requirements  Future Appointments Date Type Provider Dept  06/05/23  Appointment Cephus Collin, MD [facility]  06/25/23 Appointment Cephus Collin, MD [facility]  Showing future appointments within next 90 days and meeting all other requirements  Disposition: Discharge home  Discharge (Date  Time): 05/08/2023; 1450 hrs.   Primary Care Physician: Yehuda Helms, MD Location: [facility] Note by: Cephus Collin, MD Date: 05/08/2023; Time: 3:23 PM  Disclaimer:  Medicine is not an exact science. The only guarantee in medicine is that nothing is guaranteed. It is important to note that the decision to proceed with this intervention was based on the information collected from the patient. The Data and conclusions were drawn from the patient's questionnaire, the interview, and the physical examination. Because the information was provided in large part by the patient, it cannot be guaranteed that it has not been purposely or unconsciously manipulated. Every effort has been made to obtain as much relevant data as possible for this evaluation. It is important to note that the conclusions that lead to this procedure are derived in large part from the available data. Always take into account that the treatment will also be dependent on availability of resources and existing treatment guidelines, considered by other Pain Management Practitioners as being common knowledge and practice, at the time of the intervention. For Medico-Legal purposes, it is also important to point out that variation in procedural techniques and pharmacological choices are the acceptable norm. The indications, contraindications, technique, and results of the above procedure should only be interpreted and judged by a Board-Certified Interventional Pain Specialist with extensive familiarity and expertise in the same exact procedure and technique.

## 2023-05-09 ENCOUNTER — Telehealth: Payer: Self-pay

## 2023-05-09 NOTE — Telephone Encounter (Signed)
Attempt to call for post-procedure status. No answer. VM box full.

## 2023-06-02 DIAGNOSIS — Z981 Arthrodesis status: Secondary | ICD-10-CM | POA: Diagnosis not present

## 2023-06-02 DIAGNOSIS — M4105 Infantile idiopathic scoliosis, thoracolumbar region: Secondary | ICD-10-CM | POA: Diagnosis not present

## 2023-06-02 DIAGNOSIS — G894 Chronic pain syndrome: Secondary | ICD-10-CM | POA: Diagnosis not present

## 2023-06-05 ENCOUNTER — Encounter: Payer: Self-pay | Admitting: Student in an Organized Health Care Education/Training Program

## 2023-06-05 ENCOUNTER — Ambulatory Visit
Payer: Medicare HMO | Attending: Student in an Organized Health Care Education/Training Program | Admitting: Student in an Organized Health Care Education/Training Program

## 2023-06-05 VITALS — BP 147/101 | HR 88 | Temp 97.1°F | Resp 24 | Ht <= 58 in | Wt 145.0 lb

## 2023-06-05 DIAGNOSIS — M7918 Myalgia, other site: Secondary | ICD-10-CM | POA: Diagnosis not present

## 2023-06-05 DIAGNOSIS — G894 Chronic pain syndrome: Secondary | ICD-10-CM | POA: Insufficient documentation

## 2023-06-05 DIAGNOSIS — M542 Cervicalgia: Secondary | ICD-10-CM | POA: Insufficient documentation

## 2023-06-05 MED ORDER — DIAZEPAM 5 MG PO TABS
5.0000 mg | ORAL_TABLET | ORAL | Status: AC
  Administered 2023-06-05: 5 mg via ORAL

## 2023-06-05 MED ORDER — ROPIVACAINE HCL 2 MG/ML IJ SOLN
18.0000 mL | Freq: Once | INTRAMUSCULAR | Status: AC
  Administered 2023-06-05: 18 mL via PERINEURAL

## 2023-06-05 MED ORDER — ROPIVACAINE HCL 2 MG/ML IJ SOLN
INTRAMUSCULAR | Status: AC
  Filled 2023-06-05: qty 20

## 2023-06-05 MED ORDER — DIAZEPAM 5 MG PO TABS
ORAL_TABLET | ORAL | Status: AC
Start: 2023-06-05 — End: ?
  Filled 2023-06-05: qty 1

## 2023-06-05 NOTE — Progress Notes (Signed)
 PROVIDER NOTE: Information contained herein reflects review and annotations entered in association with encounter. Interpretation of such information and data should be left to medically-trained personnel. Information provided to patient can be located elsewhere in the medical record under "Patient Instructions". Document created using STT-dictation technology, any transcriptional errors that may result from process are unintentional.    Patient: Brianna Arnold  Service Category: Procedure  Provider: Edward Jolly, MD  DOB: 11/12/1990  DOS: 06/05/2023  Location: [facility]  MRN: [id number]  Setting: Ambulatory - outpatient  Referring Provider: Marguarite Arbour, MD  Type: Established Patient  Specialty: Interventional Pain Management  PCP: Marguarite Arbour, MD   Primary Reason for Visit: Interventional Pain Management Treatment. CC: back pain  Procedure:          Anesthesia, Analgesia, Anxiolysis:  Type: Lumbar Paraspinal Muscle Trigger Point Injection (3+)         Quadratus lumborum, erector spinae, multifidus and periscapular region and bilateral cervical/trapezius TPI CPT: 20553 Primary Purpose: Therapeutic Approach: Percutaneous, ipsilateral approach. Laterality: Midline        Type: Local Anesthesia   Position: Prone   Indications: 1. Chronic pain syndrome   2. Myofascial pain syndrome of lumbar spine   3. Cervicalgia       Pain Score: Pre-procedure: 6 /10 Post-procedure: 3 /10   Pre-op H&P Assessment:  Brianna Arnold is a 33 y.o. (year old), female patient, seen today for interventional treatment. She  has a past surgical history that includes Cervical spine surgery (07/2014); Back surgery (10/2014); and Anterior cervical decomp/discectomy fusion (N/A, 02/11/2019). Brianna Arnold has a current medication list which includes the following prescription(s): albuterol, baclofen, buprenorphine, vitamin d3, cyanocobalamin, cyclobenzaprine, ergocalciferol,  famotidine, ferrous sulfate, gabapentin, medroxyprogesterone, oxybutynin, promethazine, propranolol er, venlafaxine xr, vitamin b-12, zolpidem, ciprofloxacin, dicyclomine, meloxicam, methocarbamol, ofloxacin, and prednisone. Her primarily concern today is the Back Pain (Neck, shoulders, back)   Initial Vital Signs:  Pulse/HCG Rate: 88ECG Heart Rate: 77 Temp: (!) 97.1 F (36.2 C) Resp: 16 BP: (!) 131/100 SpO2: 100 %  BMI: Estimated body mass index is 33.1 kg/m as calculated from the following:   Height as of this encounter: 4' 7.5" (1.41 m).   Weight as of this encounter: 145 lb (65.8 kg).  Risk Assessment: Allergies: Reviewed. She is allergic to hypafix [wound dressings], ativan [lorazepam], and tizanidine.  Allergy Precautions: None required Coagulopathies: Reviewed. None identified.  Blood-thinner therapy: None at this time Active Infection(s): Reviewed. None identified. [name] is afebrile  Site Confirmation: Brianna Arnold was asked to confirm the procedure and laterality before marking the site Procedure checklist: Completed Consent: Before the procedure and under the influence of no sedative(s), amnesic(s), or anxiolytics, the patient was informed of the treatment options, risks and possible complications. To fulfill our ethical and legal obligations, as recommended by the American Medical Association's Code of Ethics, I have informed the patient of my clinical impression; the nature and purpose of the treatment or procedure; the risks, benefits, and possible complications of the intervention; the alternatives, including doing nothing; the risk(s) and benefit(s) of the alternative treatment(s) or procedure(s); and the risk(s) and benefit(s) of doing nothing. The patient was provided information about the general risks and possible complications associated with the procedure. These may include, but are not limited to: failure to achieve desired goals, infection, bleeding, organ or nerve  damage, allergic reactions, paralysis, and death. In addition, the patient was informed of those risks and complications associated to the procedure, such as  failure to decrease pain; infection; bleeding; organ or nerve damage with subsequent damage to sensory, motor, and/or autonomic systems, resulting in permanent pain, numbness, and/or weakness of one or several areas of the body; allergic reactions; (i.e.: anaphylactic reaction); and/or death. Furthermore, the patient was informed of those risks and complications associated with the medications. These include, but are not limited to: allergic reactions (i.e.: anaphylactic or anaphylactoid reaction(s)); adrenal axis suppression; blood sugar elevation that in diabetics may result in ketoacidosis or comma; water retention that in patients with history of congestive heart failure may result in shortness of breath, pulmonary edema, and decompensation with resultant heart failure; weight gain; swelling or edema; medication-induced neural toxicity; particulate matter embolism and blood vessel occlusion with resultant organ, and/or nervous system infarction; and/or aseptic necrosis of one or more joints. Finally, the patient was informed that Medicine is not an exact science; therefore, there is also the possibility of unforeseen or unpredictable risks and/or possible complications that may result in a catastrophic outcome. The patient indicated having understood very clearly. We have given the patient no guarantees and we have made no promises. Enough time was given to the patient to ask questions, all of which were answered to the patient's satisfaction. Brianna Arnold has indicated that she wanted to continue with the procedure. Attestation: I, the ordering provider, attest that I have discussed with the patient the benefits, risks, side-effects, alternatives, likelihood of achieving goals, and potential problems during recovery for the procedure that I have provided  informed consent. Date  Time: 06/05/2023  1:35 PM  Pre-Procedure Preparation:  Monitoring: As per clinic protocol. Respiration, ETCO2, SpO2, BP, heart rate and rhythm monitor placed and checked for adequate function Safety Precautions: Patient was assessed for positional comfort and pressure points before starting the procedure. Time-out: I initiated and conducted the "Time-out" before starting the procedure, as per protocol. The patient was asked to participate by confirming the accuracy of the "Time Out" information. Verification of the correct person, site, and procedure were performed and confirmed by me, the nursing staff, and the patient. "Time-out" conducted as per Joint Commission's Universal Protocol (UP.01.01.01). Time:    Description of Procedure:          Area Prepped: Entire Lower Lumbosacral Region & Right cervical region DuraPrep (Iodine Povacrylex [0.7% available iodine] and Isopropyl Alcohol, 74% w/w) Safety Precautions: Aspiration looking for blood return was conducted prior to all injections. At no point did we inject any substances, as a needle was being advanced. No attempts were made at seeking any paresthesias. Safe injection practices and needle disposal techniques used. Medications properly checked for expiration dates. SDV (single dose vial) medications used. Description of the Procedure: Protocol guidelines were followed. The patient was placed in position over the fluoroscopy table. The target area was identified and the area prepped in the usual manner. Skin & deeper tissues infiltrated with local anesthetic. Appropriate amount of time allowed to pass for local anesthetics to take effect. The procedure needles were then advanced to the target area. Proper needle placement secured. Negative aspiration confirmed. Solution injected in intermittent fashion, asking for systemic symptoms every 0.5cc of injectate. The needles were then removed and the area cleansed, making sure to  leave some of the prepping solution back to take advantage of its long term bactericidal properties.  Vitals:   06/05/23 1358 06/05/23 1400 06/05/23 1404 06/05/23 1414  BP: (!) 152/110 (!) 148/106 (!) 148/111 (!) 147/101  Pulse:      Resp: (!) 24 18 (!)  24   Temp:      TempSrc:      SpO2: 98% 100% 100%   Weight:      Height:            Start Time:   hrs. End Time:   hrs. Materials:  Needle(s) Type: Regular needle Gauge: 27G Length: 1.5-in  Medication(s): Please see orders for medications and dosing details.   15 trigger points were also injected in the right and left cervical, right and left trapezius, & lumbar paraspinal region with dry needling performed.  Each trigger point was injected with 0.5 to 1 cc of 0.2% ropivacaine.   Post-operative Assessment:  Post-procedure Vital Signs:  Pulse/HCG Rate: 88(!) 107 Temp: (!) 97.1 F (36.2 C) Resp: (!) 24 BP: (!) 147/101 SpO2: 100 %  EBL: None  Complications: No immediate post-treatment complications observed by team, or reported by patient.  Note: The patient tolerated the entire procedure well. A repeat set of vitals were taken after the procedure and the patient was kept under observation following institutional policy, for this type of procedure. Post-procedural neurological assessment was performed, showing return to baseline, prior to discharge. The patient was provided with post-procedure discharge instructions, including a section on how to identify potential problems. Should any problems arise concerning this procedure, the patient was given instructions to immediately contact us, at any time, without hesitation. In any case, we plan to contact the patient by telephone for a follow-up status report regarding this interventional procedure.  Comments:  No additional relevant information.  Plan of Care   Medications ordered for procedure: Meds ordered this encounter  Medications   ropivacaine (PF) 2 mg/mL (0.2%)  (NAROPIN) injection 18 mL   diazepam (VALIUM) tablet 5 mg    Make sure Flumazenil is available in the pyxis when using this medication. If oversedation occurs, administer 0.2 mg IV over 15 sec. If after 45 sec no response, administer 0.2 mg again over 1 min; may repeat at 1 min intervals; not to exceed 4 doses (1 mg)    No orders of the defined types were placed in this encounter.    Medications administered: We administered ropivacaine (PF) 2 mg/mL (0.2%) and diazepam.  See the medical record for exact dosing, route, and time of administration.  Follow-up plan:   Return for PRN.       Pharmacological management options:  Opioid Analgesics: Butrans patch, side effects at 7.5 mcg an hour, analgesic benefit with no side effects at 5 mcg an hour, continue  Membrane stabilizer: Continue gabapentin as prescribed  Muscle relaxant: Continue Flexeril as prescribed  NSAID: To be determined at a later time  Other analgesic(s): To be determined at a later time    Interventional management options: Ms. Ouderkirk was informed that there is no guarantee that she would be a candidate for interventional therapies. The decision will be based on the results of diagnostic studies, as well as Ms. Tamashiro's risk profile.  Procedure(s) hx Left axillary peripheral nerve stimulation: 12/30/19 providing significant pain relief.  Removed 02/29/2020              Recent Visits Date Type Provider Dept  05/08/23 Procedure visit Edward Jolly, MD [facility]  04/10/23 Procedure visit Edward Jolly, MD [facility]  Showing recent visits within past 90 days and meeting all other requirements Today's Visits Date Type Provider Dept  06/05/23 Procedure visit Edward Jolly, MD [facility]  Showing today's visits and meeting all other requirements  Future Appointments Date Type Provider Dept  06/25/23 Appointment Edward Jolly, MD [facility]  07/03/23 Appointment Edward Jolly,  MD [facility]  Showing future appointments within next 90 days and meeting all other requirements  Disposition: Discharge home  Discharge (Date  Time): 06/05/2023; 1418 hrs.   Primary Care Physician: Marguarite Arbour, MD Location: [facility] Note by: Edward Jolly, MD Date: 06/05/2023; Time: 3:13 PM  Disclaimer:  Medicine is not an exact science. The only guarantee in medicine is that nothing is guaranteed. It is important to note that the decision to proceed with this intervention was based on the information collected from the patient. The Data and conclusions were drawn from the patient's questionnaire, the interview, and the physical examination. Because the information was provided in large part by the patient, it cannot be guaranteed that it has not been purposely or unconsciously manipulated. Every effort has been made to obtain as much relevant data as possible for this evaluation. It is important to note that the conclusions that lead to this procedure are derived in large part from the available data. Always take into account that the treatment will also be dependent on availability of resources and existing treatment guidelines, considered by other Pain Management Practitioners as being common knowledge and practice, at the time of the intervention. For Medico-Legal purposes, it is also important to point out that variation in procedural techniques and pharmacological choices are the acceptable norm. The indications, contraindications, technique, and results of the above procedure should only be interpreted and judged by a Board-Certified Interventional Pain Specialist with extensive familiarity and expertise in the same exact procedure and technique.

## 2023-06-05 NOTE — Patient Instructions (Signed)

## 2023-06-05 NOTE — Progress Notes (Signed)
Safety precautions to be maintained throughout the outpatient stay will include: orient to surroundings, keep bed in low position, maintain call bell within reach at all times, provide assistance with transfer out of bed and ambulation.

## 2023-06-06 ENCOUNTER — Telehealth: Payer: Self-pay

## 2023-06-06 NOTE — Telephone Encounter (Signed)
No answer regarding post-procedure follow-up. VM full unable to leave message.

## 2023-06-20 DIAGNOSIS — J3501 Chronic tonsillitis: Secondary | ICD-10-CM | POA: Insufficient documentation

## 2023-06-21 DIAGNOSIS — Z3202 Encounter for pregnancy test, result negative: Secondary | ICD-10-CM | POA: Diagnosis not present

## 2023-06-21 DIAGNOSIS — Z01411 Encounter for gynecological examination (general) (routine) with abnormal findings: Secondary | ICD-10-CM | POA: Diagnosis not present

## 2023-06-21 DIAGNOSIS — Z3042 Encounter for surveillance of injectable contraceptive: Secondary | ICD-10-CM | POA: Diagnosis not present

## 2023-06-25 ENCOUNTER — Encounter: Payer: Self-pay | Admitting: Student in an Organized Health Care Education/Training Program

## 2023-06-25 ENCOUNTER — Ambulatory Visit
Payer: Medicare HMO | Attending: Student in an Organized Health Care Education/Training Program | Admitting: Student in an Organized Health Care Education/Training Program

## 2023-06-25 VITALS — BP 126/93 | HR 92 | Temp 97.5°F | Resp 16 | Ht <= 58 in | Wt 145.0 lb

## 2023-06-25 DIAGNOSIS — G894 Chronic pain syndrome: Secondary | ICD-10-CM | POA: Diagnosis not present

## 2023-06-25 DIAGNOSIS — M7918 Myalgia, other site: Secondary | ICD-10-CM | POA: Diagnosis not present

## 2023-06-25 DIAGNOSIS — M67912 Unspecified disorder of synovium and tendon, left shoulder: Secondary | ICD-10-CM | POA: Insufficient documentation

## 2023-06-25 DIAGNOSIS — M542 Cervicalgia: Secondary | ICD-10-CM | POA: Diagnosis not present

## 2023-06-25 DIAGNOSIS — M4105 Infantile idiopathic scoliosis, thoracolumbar region: Secondary | ICD-10-CM | POA: Diagnosis not present

## 2023-06-25 DIAGNOSIS — Z981 Arthrodesis status: Secondary | ICD-10-CM | POA: Insufficient documentation

## 2023-06-25 MED ORDER — BUPRENORPHINE 5 MCG/HR TD PTWK
1.0000 | MEDICATED_PATCH | TRANSDERMAL | 3 refills | Status: DC
Start: 2023-08-07 — End: 2023-10-14

## 2023-06-25 MED ORDER — TRAMADOL HCL 50 MG PO TABS: 50.0000 mg | ORAL_TABLET | Freq: Two times a day (BID) | ORAL | 3 refills | Status: DC | PRN

## 2023-06-25 NOTE — Progress Notes (Signed)
 Nursing Pain Medication Assessment:  Safety precautions to be maintained throughout the outpatient stay will include: orient to surroundings, keep bed in low position, maintain call bell within reach at all times, provide assistance with transfer out of bed and ambulation.  Medication Inspection Compliance:  patches  Medication: Buprenorphine (Suboxone) Pill/Patch Count:  3 of 4 pills remain Pill/Patch Appearance: Markings consistent with prescribed medication Bottle Appearance: Standard pharmacy container. Clearly labeled. Filled Date: 03 / 01 / 2025 Last Medication intake:  Yesterday

## 2023-06-25 NOTE — Progress Notes (Signed)
 PROVIDER NOTE: Information contained herein reflects review and annotations entered in association with encounter. Interpretation of such information and data should be left to medically-trained personnel. Information provided to patient can be located elsewhere in the medical record under "Patient Instructions". Document created using STT-dictation technology, any transcriptional errors that may result from process are unintentional.    Patient: Brianna Arnold  Service Category: E/M  Provider: Edward Jolly, MD  DOB: 1990-08-21  DOS: 06/25/2023  Specialty: Interventional Pain Management  MRN: [id number]  Setting: Ambulatory outpatient  PCP: Marguarite Arbour, MD  Type: Established Patient    Referring Provider: Marguarite Arbour, MD  Location: Office  Delivery: Face-to-face     HPI  Brianna Arnold, a 33 y.o. year old female, is here today because of her Chronic pain syndrome [G89.4]. Ms. Leinberger's primary complain today is Back Pain (All levels of back ) Last encounter: My last encounter with her was on 02/26/23 Pertinent problems: Ms. Cuadrado has Status post cervical spinal fusion; Migraine without aura and without status migrainosus, not intractable; History of lumbar spinal fusion (T11- iliac); Cervicalgia; Chronic left shoulder pain; Disorder of left rotator cuff; and Chronic pain syndrome on their pertinent problem list. Pain Assessment: Severity of Chronic pain is reported as a 7 /10. Location: Back Upper, Mid, Lower, Left, Right/pain down to the right knee. Onset: More than a month ago. Quality: Aching, Throbbing, Stabbing, Discomfort. Timing: Intermittent. Modifying factor(s): ice, heat, meds, TENS unit and TPI's. Vitals:  height is 4\' 7"  (1.397 m) and weight is 145 lb (65.8 kg). Her temporal temperature is 97.5 F (36.4 C) (abnormal). Her blood pressure is 126/93 (abnormal) and her pulse is 92. Her respiration is 16 and oxygen saturation is 100%.   Reason for encounter:  medication management.  Discussed the use of AI scribe software for clinical note transcription with the patient, who gave verbal consent to proceed.  History of Present Illness   Brianna Arnold "Marcelino Duster" is a 33 year old female who presents with difficulty swallowing and prolonged recovery from strep infections.  She experiences an inability to swallow anything, including her own saliva, which significantly impacts her ability to sleep and lie down. She describes this experience as 'pretty horrible.'  She has a history of chronic strep infections, with frequent episodes since childhood. During the first fifteen years of her life, she had strep infections at least once a month. After a five-year reprieve, the infections returned with increased severity at age 89. She is currently awaiting a scheduled appointment with an ENT specialist at [facility] for T&A surgery.  She is using Butrans patches and tramadol for pain management. She recently refilled her Butrans patches and has one left to pick up. She also has a refill available for tramadol, which she recently picked up.       Patient continues multimodal pain regimen as prescribed: Butrans patch 5 mcg an hour and Gabapentin 300 mg TID. Tramadol PRN. States that it provides pain relief and improvement in functional status.  Finds benefit with every 3 to every 4 week cervical, thoracic, lumbar TPI's.   Pharmacotherapy Assessment  Analgesic: Butrans 5 mcg/hr q 7days   Monitoring: [place] PMP: PDMP reviewed during this encounter.       Pharmacotherapy: No side-effects or adverse reactions reported. Compliance: No problems identified. Effectiveness: Clinically acceptable.  Vernie Ammons, RN  06/25/2023  9:55 AM  Sign when Signing Visit Nursing Pain Medication Assessment:  Safety precautions to be  maintained throughout the outpatient stay will include: orient to surroundings, keep bed in low position, maintain call bell  within reach at all times, provide assistance with transfer out of bed and ambulation.  Medication Inspection Compliance:  patches  Medication: Buprenorphine (Suboxone) Pill/Patch Count:  3 of 4 pills remain Pill/Patch Appearance: Markings consistent with prescribed medication Bottle Appearance: Standard pharmacy container. Clearly labeled. Filled Date: 03 / 01 / 2025 Last Medication intake:  Yesterday    UDS:  Summary  Date Value Ref Range Status  10/15/2019 Note  Final    Comment:    ==================================================================== Compliance Drug Analysis, Ur ==================================================================== Test                             Result       Flag       Units  Drug Absent but Declared for Prescription Verification   Buprenorphine                  Not Detected UNEXPECTED ng/mg creat    Transdermal buprenorphine, as indicated in the declared medication    list, is not always detected even when used as directed.    Tramadol                       Not Detected UNEXPECTED ng/mg creat   Gabapentin                     Not Detected UNEXPECTED   Cyclobenzaprine                Not Detected UNEXPECTED   Trazodone                      Not Detected UNEXPECTED   Promethazine                   Not Detected UNEXPECTED ==================================================================== Test                      Result    Flag   Units      Ref Range   Creatinine              91               mg/dL      >=24 ==================================================================== Declared Medications:  The flagging and interpretation on this report are based on the  following declared medications.  Unexpected results may arise from  inaccuracies in the declared medications.   **Note: The testing scope of this panel includes these medications:   Cyclobenzaprine (Flexeril)  Gabapentin (Neurontin)  Promethazine (Phenergan)  Tramadol (Ultram)   Trazodone (Desyrel)   **Note: The testing scope of this panel does not include small to  moderate amounts of these reported medications:   Buprenorphine Patch (BuTrans)   **Note: The testing scope of this panel does not include the  following reported medications:   Albuterol (Ventolin HFA)  Ethinyl Estradiol (Sprintec)  Famotidine (Pepcid)  Iron  Norgestimate (Sprintec)  Vitamin B12  Vitamin D3 ==================================================================== For clinical consultation, please call [phone]. ====================================================================      ROS  Constitutional: Denies any fever or chills Gastrointestinal: No reported hemesis, hematochezia, vomiting, or acute GI distress Musculoskeletal:  Chronic cervical, thoracic, lumbar spine pain, left hip pain Neurological: No reported episodes of acute onset apraxia, aphasia, dysarthria, agnosia, amnesia, paralysis, loss of coordination, or loss  of consciousness  Medication Review  Vitamin D3, albuterol, baclofen, buprenorphine, cyanocobalamin, cyclobenzaprine, ergocalciferol, famotidine, ferrous sulfate, gabapentin, medroxyPROGESTERone, oxyBUTYnin, promethazine, propranolol ER, traMADol, venlafaxine XR, vitamin B-12, and zolpidem  History Review  Allergy: Ms. Martine is allergic to hypafix [wound dressings], ativan [lorazepam], and tizanidine. Drug: Ms. Rehmann  reports no history of drug use. Alcohol:  has no history on file for alcohol use. Tobacco:  reports that she has never smoked. She has never used smokeless tobacco. Social: Ms. Bonfiglio  reports that she has never smoked. She has never used smokeless tobacco. She reports that she does not use drugs. Medical:  has a past medical history of Anemia and Jarcho-Levin syndrome. Surgical: Ms. Smaltz  has a past surgical history that includes Cervical spine surgery (07/2014); Back surgery (10/2014); and Anterior cervical decomp/discectomy  fusion (N/A, 02/11/2019). Family: family history includes Hypertension in her father.  Laboratory Chemistry Profile   Renal Lab Results  Component Value Date   BUN 6 02/15/2019   CREATININE 0.63 02/15/2019   GFRAA >60 02/15/2019   GFRNONAA >60 02/15/2019    Hepatic Lab Results  Component Value Date   AST 17 02/14/2019   ALT 12 02/14/2019   ALBUMIN 3.9 02/14/2019   ALKPHOS 55 02/14/2019   LIPASE 18 02/14/2019    Electrolytes Lab Results  Component Value Date   NA 137 02/15/2019   K 4.1 02/15/2019   CL 106 02/15/2019   CALCIUM 8.1 (L) 02/15/2019    Bone No results found for: "VD25OH", "VD125OH2TOT", "WU9811BJ4", "NW2956OZ3", "25OHVITD1", "25OHVITD2", "25OHVITD3", "TESTOFREE", "TESTOSTERONE"  Inflammation (CRP: Acute Phase) (ESR: Chronic Phase) Lab Results  Component Value Date   LATICACIDVEN 1.6 04/19/2015         Note: Above Lab results reviewed.  Recent Imaging Review  [place] LIMITED JOINT SPACE STRUCTURES LOW BILAT Procedure:  Injection of right hip joint under ultrasound guidance. Ultrasound guidance utilized for in-plane approach to the right hip joint,  no effusion noted Samsung HS60 device utilized with permanent recording / reporting. Verbal informed consent obtained and verified. Skin prepped in a sterile fashion. Ethyl chloride for topical local analgesia.  Completed without difficulty and tolerated well. Medication: triamcinolone acetonide 40 mg/mL suspension for injection 1 mL  total and 2 mL lidocaine 1% without epinephrine utilized for needle  placement anesthetic Advised to contact for fevers/chills, erythema, induration, drainage, or  persistent bleeding.  Procedure:  Injection of right greater trochanter under ultrasound  guidance. Ultrasound guidance utilized for out of plane approach to the right  greater trochanter, no clearly visualized sonographic evidence of  tendinopathy Samsung HS60 device utilized with permanent recording /  reporting. Verbal informed consent obtained and verified. Skin prepped in a sterile fashion. Ethyl chloride for topical local analgesia.  Completed without difficulty and tolerated well. Medication: triamcinolone acetonide 40 mg/mL suspension for injection 1 mL  total and 2 mL lidocaine 1% without epinephrine utilized for needle  placement anesthetic Advised to contact for fevers/chills, erythema, induration, drainage, or  persistent bleeding.  Procedure:  Injection of left hip joint under ultrasound guidance. Ultrasound guidance utilized for in-plane approach to left hip joint, no  effusion visualized Samsung HS60 device utilized with permanent recording / reporting. Verbal informed consent obtained and verified. Skin prepped in a sterile fashion. Ethyl chloride for topical local analgesia.  Completed without difficulty and tolerated well. Medication: triamcinolone acetonide 40 mg/mL suspension for injection 1 mL  total and 2 mL lidocaine 1% without epinephrine utilized for needle  placement anesthetic Advised to contact  for fevers/chills, erythema, induration, drainage, or  persistent bleeding.  Procedure:  Injection of left greater trochanter under ultrasound  guidance. Ultrasound guidance utilized for out of plane approach to left greater  trochanteric region, no effusion Samsung HS60 device utilized with permanent recording / reporting. Verbal informed consent obtained and verified. Skin prepped in a sterile fashion. Ethyl chloride for topical local analgesia.  Completed without difficulty and tolerated well. Medication: triamcinolone acetonide 40 mg/mL suspension for injection 1 mL  total and 2 mL lidocaine 1% without epinephrine utilized for needle  placement anesthetic Advised to contact for fevers/chills, erythema, induration, drainage, or  persistent bleeding. Note: Reviewed        Physical Exam  General appearance: Well nourished, well developed, and well  hydrated. In no apparent acute distress Mental status: Alert, oriented x 3 (person, place, & time)       Respiratory: No evidence of acute respiratory distress Eyes: [name] Vitals: BP (!) 126/93 (BP Location: Right Arm, Patient Position: Sitting, Cuff Size: Normal)   Pulse 92   Temp (!) 97.5 F (36.4 C) (Temporal)   Resp 16   Ht 4\' 7"  (1.397 m)   Wt 145 lb (65.8 kg)   SpO2 100%   BMI 33.70 kg/m  BMI: Estimated body mass index is 33.7 kg/m as calculated from the following:   Height as of this encounter: 4\' 7"  (1.397 m).   Weight as of this encounter: 145 lb (65.8 kg). Ideal: Patient must be at least 60 in tall to calculate ideal body weight  Cervical Spine Exam  Skin & Axial Inspection: Well healed scar from previous spine surgery detected Alignment: Asymmetric Functional ROM: Pain restricted ROM      Stability: No instability detected Muscle Tone/Strength: Functionally intact. No obvious neuro-muscular anomalies detected. Sensory (Neurological): Neuropathic pain pattern Palpation: No palpable anomalies                 Thoracic Spine Area Exam  Skin & Axial Inspection: Well healed scar from previous spine surgery detected Alignment: Symmetrical Functional ROM: Mechanically restricted ROM Stability: No instability detected Muscle Tone/Strength: Functionally intact. No obvious neuro-muscular anomalies detected. Sensory (Neurological): Neurogenic pain pattern Muscle strength & Tone: No palpable anomalies   Lumbar Exam  Skin & Axial Inspection: Well healed scar from previous spine surgery detected Alignment: Scoliosis detected, significant Functional ROM: Pain restricted ROM       Stability: No instability detected Muscle Tone/Strength: Functionally intact. No obvious neuro-muscular anomalies detected. Sensory (Neurological):  Musculoskeletal pain pattern Palpation: Complains of area being tender to palpation         Gait & Posture Assessment  Ambulation: Limited Gait:  Antalgic Posture: Difficulty standing up straight, due to pain    Lower Extremity Exam      Side: Right lower extremity   Side: Left lower extremity  Stability: No instability observed           Stability: No instability observed          Skin & Extremity Inspection: Skin color, temperature, and hair growth are WNL. No peripheral edema or cyanosis. No masses, redness, swelling, asymmetry, or associated skin lesions. No contractures.   Skin & Extremity Inspection: Skin color, temperature, and hair growth are WNL. No peripheral edema or cyanosis. No masses, redness, swelling, asymmetry, or associated skin lesions. No contractures.  Functional ROM: Pain restricted ROM of the left hip, pain overlying SI joint and piriformis  Functional ROM: Pain restricted ROM                  Muscle Tone/Strength: Functionally intact. No obvious neuro-muscular anomalies detected.   Muscle Tone/Strength: Functionally intact. No obvious neuro-muscular anomalies detected.  Sensory (Neurological): Musculoskeletal       Sensory (Neurological): Unimpaired        DTR: Patellar: deferred today Achilles: deferred today Plantar: deferred today   DTR: Patellar: deferred today Achilles: deferred today Plantar: deferred today  Palpation: No palpable anomalies   Palpation: No palpable anomalies      Assessment   Diagnosis Status  1. Chronic pain syndrome   2. Myofascial pain syndrome of lumbar spine   3. Cervicalgia   4. Infantile idiopathic scoliosis of thoracolumbar region   5. History of lumbar spinal fusion (T11- iliac)   6. Disorder of left rotator cuff     Controlled Controlled Controlled     Plan of Care    Ms. Karletta Millay Galeno has a current medication list which includes the following long-term medication(s): albuterol, famotidine, ferrous sulfate, gabapentin, medroxyprogesterone, promethazine, propranolol er, and zolpidem.  Pharmacotherapy (Medications Ordered): Meds  ordered this encounter  Medications   buprenorphine (BUTRANS) 5 MCG/HR PTWK    Sig: Place 1 patch onto the skin once a week.    Dispense:  4 patch    Refill:  3   traMADol (ULTRAM) 50 MG tablet    Sig: Take 1 tablet (50 mg total) by mouth every 12 (twelve) hours as needed for severe pain (pain score 7-10).    Dispense:  60 tablet    Refill:  3    Fill one day early if pharmacy is closed on scheduled refill date.    Follow-up plan:   Return in about 4 months (around 10/25/2023) for MM, F2F.     Pharmacological management options:  Opioid Analgesics: Butrans patch, side effects at 7.5 mcg an hour, analgesic benefit with no side effects at 5 mcg an hour, continue  Membrane stabilizer: Continue gabapentin as prescribed  Muscle relaxant: Continue Flexeril as prescribed  NSAID: To be determined at a later time  Other analgesic(s): To be determined at a later time    Interventional management options: Ms. Dray was informed that there is no guarantee that she would be a candidate for interventional therapies. The decision will be based on the results of diagnostic studies, as well as Ms. Eggert's risk profile.  Procedure(s) hx Left axillary peripheral nerve stimulation: 12/30/19 providing significant pain relief.  Removed 02/29/2020               Recent Visits Date Type Provider Dept  06/05/23 Procedure visit Edward Jolly, MD [facility]  05/08/23 Procedure visit Edward Jolly, MD [facility]  04/10/23 Procedure visit Edward Jolly, MD [facility]  Showing recent visits within past 90 days and meeting all other requirements Today's Visits Date Type Provider Dept  06/25/23 Office Visit Edward Jolly, MD [facility]  Showing today's visits and meeting all other requirements Future Appointments Date Type Provider Dept  07/03/23 Appointment Edward Jolly, MD [facility]  Showing future appointments within next 90 days and meeting all  other requirements  I discussed the assessment and treatment plan with the patient. The patient was provided an opportunity to ask questions and all were answered. The patient agreed with the plan and demonstrated an understanding of the instructions.  Patient advised to call back or seek an in-person evaluation  if the symptoms or condition worsens.  Duration of encounter: .  Note by: Edward Jolly, MD Date: 06/25/2023; Time: 10:52 AM

## 2023-07-02 DIAGNOSIS — G894 Chronic pain syndrome: Secondary | ICD-10-CM | POA: Diagnosis not present

## 2023-07-02 DIAGNOSIS — Z981 Arthrodesis status: Secondary | ICD-10-CM | POA: Diagnosis not present

## 2023-07-02 DIAGNOSIS — M4105 Infantile idiopathic scoliosis, thoracolumbar region: Secondary | ICD-10-CM | POA: Diagnosis not present

## 2023-07-03 ENCOUNTER — Other Ambulatory Visit: Payer: Self-pay | Admitting: Student in an Organized Health Care Education/Training Program

## 2023-07-03 ENCOUNTER — Encounter: Payer: Self-pay | Admitting: Student in an Organized Health Care Education/Training Program

## 2023-07-03 ENCOUNTER — Encounter
Admission: RE | Admit: 2023-07-03 | Discharge: 2023-07-03 | Disposition: A | Discharge status: Home / Self Care | Source: Home / Self Care | Attending: Student in an Organized Health Care Education/Training Program | Admitting: Student in an Organized Health Care Education/Training Program

## 2023-07-03 ENCOUNTER — Ambulatory Visit: Payer: Medicare HMO | Admitting: Student in an Organized Health Care Education/Training Program

## 2023-07-03 ENCOUNTER — Ambulatory Visit
Admission: RE | Admit: 2023-07-03 | Discharge: 2023-07-03 | Disposition: A | Discharge status: Home / Self Care | Source: Ambulatory Visit | Attending: Student in an Organized Health Care Education/Training Program | Admitting: Student in an Organized Health Care Education/Training Program

## 2023-07-03 VITALS — BP 127/92 | HR 79 | Temp 98.8°F | Resp 16 | Ht <= 58 in | Wt 163.5 lb

## 2023-07-03 DIAGNOSIS — M7918 Myalgia, other site: Secondary | ICD-10-CM | POA: Insufficient documentation

## 2023-07-03 DIAGNOSIS — M4105 Infantile idiopathic scoliosis, thoracolumbar region: Secondary | ICD-10-CM

## 2023-07-03 DIAGNOSIS — M533 Sacrococcygeal disorders, not elsewhere classified: Secondary | ICD-10-CM | POA: Insufficient documentation

## 2023-07-03 DIAGNOSIS — M25552 Pain in left hip: Secondary | ICD-10-CM | POA: Insufficient documentation

## 2023-07-03 DIAGNOSIS — M25551 Pain in right hip: Secondary | ICD-10-CM | POA: Insufficient documentation

## 2023-07-03 DIAGNOSIS — G8929 Other chronic pain: Secondary | ICD-10-CM | POA: Insufficient documentation

## 2023-07-03 DIAGNOSIS — G894 Chronic pain syndrome: Secondary | ICD-10-CM | POA: Insufficient documentation

## 2023-07-03 MED ORDER — DIAZEPAM 5 MG PO TABS
ORAL_TABLET | ORAL | Status: AC
  Filled 2023-07-03: qty 1

## 2023-07-03 MED ORDER — DIAZEPAM 5 MG PO TABS
5.0000 mg | ORAL_TABLET | ORAL | Status: AC
  Administered 2023-07-03: 5 mg via ORAL

## 2023-07-03 MED ORDER — ROPIVACAINE HCL 2 MG/ML IJ SOLN
18.0000 mL | Freq: Once | INTRAMUSCULAR | Status: AC
  Administered 2023-07-03: 18 mL via PERINEURAL
  Filled 2023-07-03: qty 20

## 2023-07-03 NOTE — Progress Notes (Signed)
 PROVIDER NOTE: Information contained herein reflects review and annotations entered in association with encounter. Interpretation of such information and data should be left to medically-trained personnel. Information provided to patient can be located elsewhere in the medical record under "Patient Instructions". Document created using STT-dictation technology, any transcriptional errors that may result from process are unintentional.    Patient: Brianna Arnold  Service Category: Procedure  Provider: Edward Jolly, MD  DOB: Apr 22, 1991  DOS: 07/03/2023  Location: [facility]  MRN: [id number]  Setting: Ambulatory - outpatient  Referring Provider: Marguarite Arbour, MD  Type: Established Patient  Specialty: Interventional Pain Management  PCP: Marguarite Arbour, MD   Primary Reason for Visit: Interventional Pain Management Treatment. CC: back pain  Procedure:          Anesthesia, Analgesia, Anxiolysis:  Type: Lumbar Paraspinal Muscle Trigger Point Injection (3+)         Quadratus lumborum, erector spinae, multifidus and periscapular region and bilateral cervical/trapezius TPI CPT: 20553 Primary Purpose: Therapeutic Approach: Percutaneous, ipsilateral approach. Laterality: Midline        Type: Local Anesthesia   5 mg PO Valium Position: Prone   Indications: 1. Chronic pain syndrome   2. Myofascial pain syndrome of lumbar spine   3. Infantile idiopathic scoliosis of thoracolumbar region   4. Chronic hip pain, bilateral   5. Chronic SI joint pain       Pain Score: Pre-procedure: 8 /10 Post-procedure: 8 /10   Pre-op H&P Assessment:  Brianna Arnold is a 33 y.o. (year old), female patient, seen today for interventional treatment. She  has a past surgical history that includes Cervical spine surgery (07/2014); Back surgery (10/2014); and Anterior cervical decomp/discectomy fusion (N/A, 02/11/2019). Brianna Arnold has a current medication list which includes the following  prescription(s): albuterol, baclofen, [START ON 08/07/2023] buprenorphine, vitamin d3, cyanocobalamin, cyclobenzaprine, ergocalciferol, famotidine, ferrous sulfate, gabapentin, medroxyprogesterone, oxybutynin, promethazine, propranolol er, [START ON 07/23/2023] tramadol, venlafaxine xr, vitamin b-12, and zolpidem. Her primarily concern today is the Back Pain (Upper, mid lower bilateral ) and Hip Pain (Right )   Initial Vital Signs:  Pulse/HCG Rate: 79  Temp: 98.8 F (37.1 C) Resp: 16 BP: (!) 127/92 SpO2: 100 %  BMI: Estimated body mass index is 37.32 kg/m as calculated from the following:   Height as of this encounter: 4' 7.5" (1.41 m).   Weight as of this encounter: 163 lb 8 oz (74.2 kg).  Risk Assessment: Allergies: Reviewed. She is allergic to hypafix [wound dressings], ativan [lorazepam], and tizanidine.  Allergy Precautions: None required Coagulopathies: Reviewed. None identified.  Blood-thinner therapy: None at this time Active Infection(s): Reviewed. None identified. Brianna Arnold is afebrile  Site Confirmation: Brianna Arnold was asked to confirm the procedure and laterality before marking the site Procedure checklist: Completed Consent: Before the procedure and under the influence of no sedative(s), amnesic(s), or anxiolytics, the patient was informed of the treatment options, risks and possible complications. To fulfill our ethical and legal obligations, as recommended by the American Medical Association's Code of Ethics, I have informed the patient of my clinical impression; the nature and purpose of the treatment or procedure; the risks, benefits, and possible complications of the intervention; the alternatives, including doing nothing; the risk(s) and benefit(s) of the alternative treatment(s) or procedure(s); and the risk(s) and benefit(s) of doing nothing. The patient was provided information about the general risks and possible complications associated with the procedure. These may  include, but are not limited to: failure to achieve  desired goals, infection, bleeding, organ or nerve damage, allergic reactions, paralysis, and death. In addition, the patient was informed of those risks and complications associated to the procedure, such as failure to decrease pain; infection; bleeding; organ or nerve damage with subsequent damage to sensory, motor, and/or autonomic systems, resulting in permanent pain, numbness, and/or weakness of one or several areas of the body; allergic reactions; (i.e.: anaphylactic reaction); and/or death. Furthermore, the patient was informed of those risks and complications associated with the medications. These include, but are not limited to: allergic reactions (i.e.: anaphylactic or anaphylactoid reaction(s)); adrenal axis suppression; blood sugar elevation that in diabetics may result in ketoacidosis or comma; water retention that in patients with history of congestive heart failure may result in shortness of breath, pulmonary edema, and decompensation with resultant heart failure; weight gain; swelling or edema; medication-induced neural toxicity; particulate matter embolism and blood vessel occlusion with resultant organ, and/or nervous system infarction; and/or aseptic necrosis of one or more joints. Finally, the patient was informed that Medicine is not an exact science; therefore, there is also the possibility of unforeseen or unpredictable risks and/or possible complications that may result in a catastrophic outcome. The patient indicated having understood very clearly. We have given the patient no guarantees and we have made no promises. Enough time was given to the patient to ask questions, all of which were answered to the patient's satisfaction. Brianna Arnold has indicated that she wanted to continue with the procedure. Attestation: I, the ordering provider, attest that I have discussed with the patient the benefits, risks, side-effects, alternatives,  likelihood of achieving goals, and potential problems during recovery for the procedure that I have provided informed consent. Date  Time: 07/03/2023  1:17 PM  Pre-Procedure Preparation:  Monitoring: As per clinic protocol. Respiration, ETCO2, SpO2, BP, heart rate and rhythm monitor placed and checked for adequate function Safety Precautions: Patient was assessed for positional comfort and pressure points before starting the procedure. Time-out: I initiated and conducted the "Time-out" before starting the procedure, as per protocol. The patient was asked to participate by confirming the accuracy of the "Time Out" information. Verification of the correct person, site, and procedure were performed and confirmed by me, the nursing staff, and the patient. "Time-out" conducted as per Joint Commission's Universal Protocol (UP.01.01.01). Time: 1422  Description of Procedure:          Area Prepped: Entire Lower Lumbosacral Region & Right cervical region DuraPrep (Iodine Povacrylex [0.7% available iodine] and Isopropyl Alcohol, 74% w/w) Safety Precautions: Aspiration looking for blood return was conducted prior to all injections. At no point did we inject any substances, as a needle was being advanced. No attempts were made at seeking any paresthesias. Safe injection practices and needle disposal techniques used. Medications properly checked for expiration dates. SDV (single dose vial) medications used. Description of the Procedure: Protocol guidelines were followed. The patient was placed in position over the fluoroscopy table. The target area was identified and the area prepped in the usual manner. Skin & deeper tissues infiltrated with local anesthetic. Appropriate amount of time allowed to pass for local anesthetics to take effect. The procedure needles were then advanced to the target area. Proper needle placement secured. Negative aspiration confirmed. Solution injected in intermittent fashion, asking for  systemic symptoms every 0.5cc of injectate. The needles were then removed and the area cleansed, making sure to leave some of the prepping solution back to take advantage of its long term bactericidal properties.  Vitals:  07/03/23 1331  BP: (!) 127/92  Pulse: 79  Resp: 16  Temp: 98.8 F (37.1 C)  TempSrc: Temporal  SpO2: 100%  Weight: 163 lb 8 oz (74.2 kg)  Height: 4' 7.5" (1.41 m)        Start Time: 1422 hrs. End Time:   hrs. Materials:  Needle(s) Type: Regular needle Gauge: 27G Length: 1.5-in  Medication(s): Please see orders for medications and dosing details.   15 trigger points were also injected in the right and left cervical, right and left trapezius, & lumbar paraspinal region with dry needling performed.  Each trigger point was injected with 0.5 to 1 cc of 0.2% ropivacaine.   Post-operative Assessment:  Post-procedure Vital Signs:  Pulse/HCG Rate: 79  Temp: 98.8 F (37.1 C) Resp: 16 BP: (!) 127/92 SpO2: 100 %  EBL: None  Complications: No immediate post-treatment complications observed by team, or reported by patient.  Note: The patient tolerated the entire procedure well. A repeat set of vitals were taken after the procedure and the patient was kept under observation following institutional policy, for this type of procedure. Post-procedural neurological assessment was performed, showing return to baseline, prior to discharge. The patient was provided with post-procedure discharge instructions, including a section on how to identify potential problems. Should any problems arise concerning this procedure, the patient was given instructions to immediately contact us, at any time, without hesitation. In any case, we plan to contact the patient by telephone for a follow-up status report regarding this interventional procedure.  Comments:  No additional relevant information.  Plan of Care   Medications ordered for procedure: Meds ordered this encounter   Medications   ropivacaine (PF) 2 mg/mL (0.2%) (NAROPIN) injection 18 mL   diazepam (VALIUM) tablet 5 mg    Make sure Flumazenil is available in the pyxis when using this medication. If oversedation occurs, administer 0.2 mg IV over 15 sec. If after 45 sec no response, administer 0.2 mg again over 1 min; may repeat at 1 min intervals; not to exceed 4 doses (1 mg)   Pt experiencing increased right hip and right sij pain with radiation to legs Orders Placed This Encounter  Procedures   DG Si Joints    Standing Status:   Future    Number of Occurrences:   1    Expiration Date:   08/03/2023    Scheduling Instructions:     Please make sure that the patient understands that this needs to be done as soon as possible. Never have the patient do the imaging "just before the next appointment". Inform patient that having the imaging done within the [facility] will expedite the availability of the results and will provide      imaging availability to the requesting physician. In addition inform the patient that the imaging order has an expiration date and will not be renewed if not done within the active period.    Reason for Exam (SYMPTOM  OR DIAGNOSIS REQUIRED):   Sacroiliac joint pain    Is the patient pregnant?:   No    Preferred imaging location?:   [facility]    Call Results- Best Contact Number?:   [phone] ([facility])    Release to patient:   Immediate   DG HIP UNILAT W OR W/O PELVIS 2-3 VIEWS LEFT    Please describe any evidence of DJD, such as joint narrowing, asymmetry, cysts, or any anomalies in bone density, production, or erosion.    Standing Status:  Future    Number of Occurrences:   1    Expiration Date:   08/03/2023    Scheduling Instructions:     Please make sure that the patient understands that this needs to be done as soon as possible. Never have the patient do the imaging "just before the next appointment". Inform patient that having the imaging done within  the [facility] will expedite the availability of the results and will provide      imaging availability to the requesting physician. In addition inform the patient that the imaging order has an expiration date and will not be renewed if not done within the active period.    Reason for Exam (SYMPTOM  OR DIAGNOSIS REQUIRED):   Sacroiliac joint pain    Is the patient pregnant?:   No    Preferred imaging location?:   [facility]    Call Results- Best Contact Number?:   [phone] ([facility])    Release to patient:   Immediate   DG HIP UNILAT W OR W/O PELVIS 2-3 VIEWS RIGHT    Please describe any evidence of DJD, such as joint narrowing, asymmetry, cysts, or any anomalies in bone density, production, or erosion.    Standing Status:   Future    Number of Occurrences:   1    Expiration Date:   08/03/2023    Scheduling Instructions:     Please make sure that the patient understands that this needs to be done as soon as possible. Never have the patient do the imaging "just before the next appointment". Inform patient that having the imaging done within the [facility] will expedite the availability of the results and will provide      imaging availability to the requesting physician. In addition inform the patient that the imaging order has an expiration date and will not be renewed if not done within the active period.    Reason for Exam (SYMPTOM  OR DIAGNOSIS REQUIRED):   Sacroiliac joint pain    Is the patient pregnant?:   No    Preferred imaging location?:   [facility]    Call Results- Best Contact Number?:   [phone] [facility])    Release to patient:   Immediate     Medications administered: We administered ropivacaine (PF) 2 mg/mL (0.2%) and diazepam.  See the medical record for exact dosing, route, and time of administration.  Follow-up plan:   Return for PRN.       Pharmacological management options:  Opioid Analgesics: Butrans patch, side effects  at 7.5 mcg an hour, analgesic benefit with no side effects at 5 mcg an hour, continue  Membrane stabilizer: Continue gabapentin as prescribed  Muscle relaxant: Continue Flexeril as prescribed  NSAID: To be determined at a later time  Other analgesic(s): To be determined at a later time    Interventional management options: Brianna Arnold was informed that there is no guarantee that she would be a candidate for interventional therapies. The decision will be based on the results of diagnostic studies, as well as Brianna Arnold's risk profile.  Procedure(s) hx Left axillary peripheral nerve stimulation: 12/30/19 providing significant pain relief.  Removed 02/29/2020              Recent Visits Date Type Provider Dept  06/25/23 Office Visit Edward Jolly, MD [facility]  06/05/23 Procedure visit Edward Jolly, MD [facility]  05/08/23 Procedure visit Edward Jolly, MD [facility]  04/10/23  Procedure visit Edward Jolly, MD [facility]  Showing recent visits within past 90 days and meeting all other requirements Today's Visits Date Type Provider Dept  07/03/23 Procedure visit Edward Jolly, MD [facility]  Showing today's visits and meeting all other requirements Future Appointments Date Type Provider Dept  07/31/23 Appointment Edward Jolly, MD [facility]  Showing future appointments within next 90 days and meeting all other requirements  Disposition: Discharge home  Discharge (Date  Time): 07/03/2023; 1431 hrs.   Primary Care Physician: Marguarite Arbour, MD Location: [facility] Note by: Edward Jolly, MD Date: 07/03/2023; Time: 2:59 PM  Disclaimer:  Medicine is not an exact science. The only guarantee in medicine is that nothing is guaranteed. It is important to note that the decision to proceed with this intervention was based on the information collected from the patient. The Data and conclusions were  drawn from the patient's questionnaire, the interview, and the physical examination. Because the information was provided in large part by the patient, it cannot be guaranteed that it has not been purposely or unconsciously manipulated. Every effort has been made to obtain as much relevant data as possible for this evaluation. It is important to note that the conclusions that lead to this procedure are derived in large part from the available data. Always take into account that the treatment will also be dependent on availability of resources and existing treatment guidelines, considered by other Pain Management Practitioners as being common knowledge and practice, at the time of the intervention. For Medico-Legal purposes, it is also important to point out that variation in procedural techniques and pharmacological choices are the acceptable norm. The indications, contraindications, technique, and results of the above procedure should only be interpreted and judged by a Board-Certified Interventional Pain Specialist with extensive familiarity and expertise in the same exact procedure and technique.

## 2023-07-03 NOTE — Progress Notes (Signed)
 Safety precautions to be maintained throughout the outpatient stay will include: orient to surroundings, keep bed in low position, maintain call bell within reach at all times, provide assistance with transfer out of bed and ambulation.

## 2023-07-03 NOTE — Patient Instructions (Signed)

## 2023-07-04 ENCOUNTER — Telehealth: Payer: Self-pay

## 2023-07-04 NOTE — Telephone Encounter (Signed)
 Attempt to contact patient for post-procedure f/u. No answer. LVM.

## 2023-07-10 DIAGNOSIS — Z01818 Encounter for other preprocedural examination: Secondary | ICD-10-CM | POA: Diagnosis not present

## 2023-07-10 DIAGNOSIS — G8929 Other chronic pain: Secondary | ICD-10-CM | POA: Diagnosis not present

## 2023-07-10 DIAGNOSIS — Q798 Other congenital malformations of musculoskeletal system: Secondary | ICD-10-CM | POA: Diagnosis not present

## 2023-07-10 DIAGNOSIS — I10 Essential (primary) hypertension: Secondary | ICD-10-CM | POA: Diagnosis not present

## 2023-07-22 ENCOUNTER — Telehealth: Payer: Self-pay | Admitting: *Deleted

## 2023-07-22 NOTE — Telephone Encounter (Signed)
-----   Message from Edward Jolly sent at 07/22/2023  1:53 PM EDT ----- Inform patient of SI joint results, unremarkable/normal ----- Message ----- From: Interface, Rad Results In Sent: 07/15/2023   9:23 AM EDT To: Edward Jolly, MD

## 2023-07-22 NOTE — Telephone Encounter (Signed)
X-ray results read to patient. 

## 2023-07-31 ENCOUNTER — Ambulatory Visit
Attending: Student in an Organized Health Care Education/Training Program | Admitting: Student in an Organized Health Care Education/Training Program

## 2023-07-31 ENCOUNTER — Encounter: Payer: Self-pay | Admitting: Student in an Organized Health Care Education/Training Program

## 2023-07-31 VITALS — BP 128/86 | HR 80 | Temp 98.2°F | Resp 18 | Ht <= 58 in | Wt 162.4 lb

## 2023-07-31 DIAGNOSIS — M7918 Myalgia, other site: Secondary | ICD-10-CM | POA: Insufficient documentation

## 2023-07-31 DIAGNOSIS — G894 Chronic pain syndrome: Secondary | ICD-10-CM | POA: Insufficient documentation

## 2023-07-31 DIAGNOSIS — M4105 Infantile idiopathic scoliosis, thoracolumbar region: Secondary | ICD-10-CM | POA: Insufficient documentation

## 2023-07-31 MED ORDER — DIAZEPAM 5 MG PO TABS
ORAL_TABLET | ORAL | Status: AC
Start: 2023-07-31 — End: ?
  Filled 2023-07-31: qty 1

## 2023-07-31 MED ORDER — ROPIVACAINE HCL 2 MG/ML IJ SOLN
18.0000 mL | Freq: Once | INTRAMUSCULAR | Status: AC
  Administered 2023-07-31: 18 mL via PERINEURAL

## 2023-07-31 MED ORDER — ROPIVACAINE HCL 2 MG/ML IJ SOLN
INTRAMUSCULAR | Status: AC
  Filled 2023-07-31: qty 20

## 2023-07-31 NOTE — Progress Notes (Signed)
 PROVIDER NOTE: Information contained herein reflects review and annotations entered in association with encounter. Interpretation of such information and data should be left to medically-trained personnel. Information provided to patient can be located elsewhere in the medical record under "Patient Instructions". Document created using STT-dictation technology, any transcriptional errors that may result from process are unintentional.    Patient: Brianna Arnold  Service Category: Procedure  Provider: Edward Jolly, MD  DOB: 03/10/1991  DOS: 07/31/2023  Location: [facility]  MRN: [id number]  Setting: Ambulatory - outpatient  Referring Provider: Marguarite Arbour, MD  Type: Established Patient  Specialty: Interventional Pain Management  PCP: Marguarite Arbour, MD   Primary Reason for Visit: Interventional Pain Management Treatment. CC: back pain  Procedure:          Anesthesia, Analgesia, Anxiolysis:  Type: Lumbar Paraspinal Muscle Trigger Point Injection (3+)         Quadratus lumborum, erector spinae, multifidus and periscapular region and bilateral cervical/trapezius TPI CPT: 20553 Primary Purpose: Therapeutic Approach: Percutaneous, ipsilateral approach. Laterality: Midline        Type: Local Anesthesia   5 mg PO Valium Position: Prone   Indications: 1. Chronic pain syndrome   2. Myofascial pain syndrome of lumbar spine   3. Infantile idiopathic scoliosis of thoracolumbar region       Pain Score: Pre-procedure: 6 /10 Post-procedure: 3 /10   Pre-op H&P Assessment:  Ms. Lawhorne is a 33 y.o. (year old), female patient, seen today for interventional treatment. She  has a past surgical history that includes Cervical spine surgery (07/2014); Back surgery (10/2014); and Anterior cervical decomp/discectomy fusion (N/A, 02/11/2019). Ms. Dewalt has a current medication list which includes the following prescription(s): albuterol, baclofen, [START ON 08/07/2023]  buprenorphine, vitamin d3, cyanocobalamin, cyclobenzaprine, ergocalciferol, famotidine, ferrous sulfate, gabapentin, medroxyprogesterone, oxybutynin, promethazine, propranolol er, tramadol, vitamin b-12, zolpidem, and venlafaxine xr. Her primarily concern today is the Neck Pain   Initial Vital Signs:  Pulse/HCG Rate: 73  Temp: 98 F (36.7 C) Resp: 18 BP: 138/83 SpO2: 100 %  BMI: Estimated body mass index is 37.75 kg/m as calculated from the following:   Height as of this encounter: 4\' 7"  (1.397 m).   Weight as of this encounter: 162 lb 6.4 oz (73.7 kg).  Risk Assessment: Allergies: Reviewed. She is allergic to hypafix [wound dressings], ativan [lorazepam], and tizanidine.  Allergy Precautions: None required Coagulopathies: Reviewed. None identified.  Blood-thinner therapy: None at this time Active Infection(s): Reviewed. None identified. Ms. Akers is afebrile  Site Confirmation: Ms. Salone was asked to confirm the procedure and laterality before marking the site Procedure checklist: Completed Consent: Before the procedure and under the influence of no sedative(s), amnesic(s), or anxiolytics, the patient was informed of the treatment options, risks and possible complications. To fulfill our ethical and legal obligations, as recommended by the American Medical Association's Code of Ethics, I have informed the patient of my clinical impression; the nature and purpose of the treatment or procedure; the risks, benefits, and possible complications of the intervention; the alternatives, including doing nothing; the risk(s) and benefit(s) of the alternative treatment(s) or procedure(s); and the risk(s) and benefit(s) of doing nothing. The patient was provided information about the general risks and possible complications associated with the procedure. These may include, but are not limited to: failure to achieve desired goals, infection, bleeding, organ or nerve damage, allergic reactions,  paralysis, and death. In addition, the patient was informed of those risks and complications associated to the procedure,  such as failure to decrease pain; infection; bleeding; organ or nerve damage with subsequent damage to sensory, motor, and/or autonomic systems, resulting in permanent pain, numbness, and/or weakness of one or several areas of the body; allergic reactions; (i.e.: anaphylactic reaction); and/or death. Furthermore, the patient was informed of those risks and complications associated with the medications. These include, but are not limited to: allergic reactions (i.e.: anaphylactic or anaphylactoid reaction(s)); adrenal axis suppression; blood sugar elevation that in diabetics may result in ketoacidosis or comma; water retention that in patients with history of congestive heart failure may result in shortness of breath, pulmonary edema, and decompensation with resultant heart failure; weight gain; swelling or edema; medication-induced neural toxicity; particulate matter embolism and blood vessel occlusion with resultant organ, and/or nervous system infarction; and/or aseptic necrosis of one or more joints. Finally, the patient was informed that Medicine is not an exact science; therefore, there is also the possibility of unforeseen or unpredictable risks and/or possible complications that may result in a catastrophic outcome. The patient indicated having understood very clearly. We have given the patient no guarantees and we have made no promises. Enough time was given to the patient to ask questions, all of which were answered to the patient's satisfaction. Ms. Renner has indicated that she wanted to continue with the procedure. Attestation: I, the ordering provider, attest that I have discussed with the patient the benefits, risks, side-effects, alternatives, likelihood of achieving goals, and potential problems during recovery for the procedure that I have provided informed consent. Date   Time: 07/31/2023 12:33 PM  Pre-Procedure Preparation:  Monitoring: As per clinic protocol. Respiration, ETCO2, SpO2, BP, heart rate and rhythm monitor placed and checked for adequate function Safety Precautions: Patient was assessed for positional comfort and pressure points before starting the procedure. Time-out: I initiated and conducted the "Time-out" before starting the procedure, as per protocol. The patient was asked to participate by confirming the accuracy of the "Time Out" information. Verification of the correct person, site, and procedure were performed and confirmed by me, the nursing staff, and the patient. "Time-out" conducted as per Joint Commission's Universal Protocol (UP.01.01.01). Time: 1329  Description of Procedure:          Area Prepped: Entire Lower Lumbosacral Region & Right cervical region DuraPrep (Iodine Povacrylex [0.7% available iodine] and Isopropyl Alcohol, 74% w/w) Safety Precautions: Aspiration looking for blood return was conducted prior to all injections. At no point did we inject any substances, as a needle was being advanced. No attempts were made at seeking any paresthesias. Safe injection practices and needle disposal techniques used. Medications properly checked for expiration dates. SDV (single dose vial) medications used. Description of the Procedure: Protocol guidelines were followed. The patient was placed in position over the fluoroscopy table. The target area was identified and the area prepped in the usual manner. Skin & deeper tissues infiltrated with local anesthetic. Appropriate amount of time allowed to pass for local anesthetics to take effect. The procedure needles were then advanced to the target area. Proper needle placement secured. Negative aspiration confirmed. Solution injected in intermittent fashion, asking for systemic symptoms every 0.5cc of injectate. The needles were then removed and the area cleansed, making sure to leave some of the  prepping solution back to take advantage of its long term bactericidal properties.  Vitals:   07/31/23 1239 07/31/23 1336  BP: 138/83 128/86  Pulse: 73 80  Resp:  18  Temp: 98 F (36.7 C) 98.2 F (36.8 C)  TempSrc:  Oral  SpO2: 100% 100%  Weight: 162 lb 6.4 oz (73.7 kg)   Height: 4\' 7"  (1.397 m)         Start Time: 1330 hrs. End Time: 1334 hrs. Materials:  Needle(s) Type: Regular needle Gauge: 27G Length: 1.5-in  Medication(s): Please see orders for medications and dosing details.   15 trigger points were also injected in the right and left cervical, right and left trapezius, & lumbar paraspinal region with dry needling performed.  Each trigger point was injected with 0.5 to 1 cc of 0.2% ropivacaine.   Post-operative Assessment:  Post-procedure Vital Signs:  Pulse/HCG Rate: 80  Temp: 98.2 F (36.8 C) Resp: 18 BP: 128/86 SpO2: 100 %  EBL: None  Complications: No immediate post-treatment complications observed by team, or reported by patient.  Note: The patient tolerated the entire procedure well. A repeat set of vitals were taken after the procedure and the patient was kept under observation following institutional policy, for this type of procedure. Post-procedural neurological assessment was performed, showing return to baseline, prior to discharge. The patient was provided with post-procedure discharge instructions, including a section on how to identify potential problems. Should any problems arise concerning this procedure, the patient was given instructions to immediately contact us, at any time, without hesitation. In any case, we plan to contact the patient by telephone for a follow-up status report regarding this interventional procedure.  Comments:  No additional relevant information.  Plan of Care   Medications ordered for procedure: Meds ordered this encounter  Medications   ropivacaine (PF) 2 mg/mL (0.2%) (NAROPIN) injection 18 mL   Pt experiencing  increased right hip and right sij pain with radiation to legs No orders of the defined types were placed in this encounter.    Medications administered: We administered ropivacaine (PF) 2 mg/mL (0.2%).  See the medical record for exact dosing, route, and time of administration.  Follow-up plan:   Return for PRN.       Pharmacological management options:  Opioid Analgesics: Butrans patch, side effects at 7.5 mcg an hour, analgesic benefit with no side effects at 5 mcg an hour, continue  Membrane stabilizer: Continue gabapentin as prescribed  Muscle relaxant: Continue Flexeril as prescribed  NSAID: To be determined at a later time  Other analgesic(s): To be determined at a later time    Interventional management options: Ms. Cottam was informed that there is no guarantee that she would be a candidate for interventional therapies. The decision will be based on the results of diagnostic studies, as well as Ms. Frein's risk profile.  Procedure(s) hx Left axillary peripheral nerve stimulation: 12/30/19 providing significant pain relief.  Removed 02/29/2020              Recent Visits Date Type Provider Dept  07/03/23 Procedure visit Edward Jolly, MD [facility]  06/25/23 Office Visit Edward Jolly, MD [facility]  06/05/23 Procedure visit Edward Jolly, MD [facility]  05/08/23 Procedure visit Edward Jolly, MD [facility]  Showing recent visits within past 90 days and meeting all other requirements Today's Visits Date Type Provider Dept  07/31/23 Procedure visit Edward Jolly, MD [facility]  Showing today's visits and meeting all other requirements Future Appointments Date Type Provider Dept  08/28/23 Appointment Edward Jolly, MD [facility]  10/15/23 Appointment Edward Jolly, MD [facility]  Showing future appointments within next 90 days and meeting all other requirements  Disposition: Discharge home   Discharge (  Date  Time): 07/31/2023; 1341 hrs.   Primary Care Physician: Marguarite Arbour, MD Location: [facility] Note by: Edward Jolly, MD Date: 07/31/2023; Time: 2:36 PM  Disclaimer:  Medicine is not an exact science. The only guarantee in medicine is that nothing is guaranteed. It is important to note that the decision to proceed with this intervention was based on the information collected from the patient. The Data and conclusions were drawn from the patient's questionnaire, the interview, and the physical examination. Because the information was provided in large part by the patient, it cannot be guaranteed that it has not been purposely or unconsciously manipulated. Every effort has been made to obtain as much relevant data as possible for this evaluation. It is important to note that the conclusions that lead to this procedure are derived in large part from the available data. Always take into account that the treatment will also be dependent on availability of resources and existing treatment guidelines, considered by other Pain Management Practitioners as being common knowledge and practice, at the time of the intervention. For Medico-Legal purposes, it is also important to point out that variation in procedural techniques and pharmacological choices are the acceptable norm. The indications, contraindications, technique, and results of the above procedure should only be interpreted and judged by a Board-Certified Interventional Pain Specialist with extensive familiarity and expertise in the same exact procedure and technique.

## 2023-07-31 NOTE — Progress Notes (Signed)
 Safety precautions to be maintained throughout the outpatient stay will include: orient to surroundings, keep bed in low position, maintain call bell within reach at all times, provide assistance with transfer out of bed and ambulation.

## 2023-07-31 NOTE — Patient Instructions (Addendum)
 Pain Management Discharge Instructions  General Discharge Instructions :  If you need to reach your doctor call: Monday-Friday 8:00 am - 4:00 pm at [phone] or toll free [phone].  After clinic hours [phone] to have operator reach doctor.  Bring all of your medication bottles to all your appointments in the pain clinic.  To cancel or reschedule your appointment with Pain Management please remember to call 24 hours in advance to avoid a fee.  Refer to the educational materials which you have been given on: General Risks, I had my Procedure. Discharge Instructions, Post Sedation.  Post Procedure Instructions:  The drugs you were given will stay in your system until tomorrow, so for the next 24 hours you should not drive, make any legal decisions or drink any alcoholic beverages.  You may eat anything you prefer, but it is better to start with liquids then soups and crackers, and gradually work up to solid foods.  Please notify your doctor immediately if you have any unusual bleeding, trouble breathing or pain that is not related to your normal pain.  Depending on the type of procedure that was done, some parts of your body may feel week and/or numb.  This usually clears up by tonight or the next day.  Walk with the use of an assistive device or accompanied by an adult for the 24 hours.  You may use ice on the affected area for the first 24 hours.  Put ice in a Ziploc bag and cover with a towel and place against area 15 minutes on 15 minutes off.  You may switch to heat after 24 hours.Trigger Point Injections Patient Information  Description: Trigger points are areas of muscle sensitive to touch which cause pain with movement, sometimes felt some distance from the site of palpation.  Usually the muscle containing these trigger points if felt as a tight band or knot.   The area of maximum tenderness or trigger point is identified, and after antiseptic preparation of the skin, a  small needle is placed into this site.  Reproduction of the pain often occurs and numbing medicine (local anesthetic) is injected into the site, sometimes along with steroid preparation.  The entire block usually lasts less than 5 minutes.  Conditions which may be treated by trigger points:  Muscular pain and spasm Nerve irritation  Preparation for the injection:  Do not eat any solid food or dairy products within 8 hours of your appointment. You may drink clear liquids up to 3 hours before appointment.  Clear liquids include water, black coffee, juice or soda.  No milk or cream please. You may take your regular medications, including pain medications, with a sip of water before your appointment.  Diabetics should hold regular insulin ( if take separately) and take 1/2 normal NPH dose the morning of the procedure.  Carry some sugar containing items with you to your appointment. A driver must accompany you and be prepared to drive you home after your procedure.  Bring all your current medications with you. An IV may be inserted and sedation may be given at the discretion of the physician.  A blood pressure cuff, EKG, and other monitors will often be applied during the procedure.  Some patients may need to have extra oxygen administered for a short period. You will be asked to provide medical information, including your allergies and medications, prior to the procedure.  We must know immediately if you are taking blood thinners (like Coumadin/Warfarin) or if you are  allergic to IV iodine contrast (dye).  We must know if you could possibly be pregnant.  Possible side-effects:  Bleeding from needle site Infection (rare, may require surgery) Nerve injury (rare) Numbness & tingling (temporary) Punctured lung (if injection around chest) Light-headedness (temporary) Pain at injection site (several days) Decreased blood pressure (rare, temporary) Weakness in arm/leg (temporary)  Call if you  experience:  Hive or difficulty breathing (go to the emergency room) Inflammation or drainage at the injection site(s)  Please note:  Although the local anesthetic injected can often make your painful muscle feel good for several hours after the injection, the pain may return.  It takes 3-7 days for steroids to work.  You may not notice any pain relief for at least one week.  If effective, we will often do a series of injections spaced 3-6 weeks apart to maximally decrease your pain.  If you have any questions please call [phone] [facility]

## 2023-08-01 ENCOUNTER — Telehealth: Payer: Self-pay

## 2023-08-01 NOTE — Telephone Encounter (Signed)
 Post procedure follow up.  LM

## 2023-08-02 DIAGNOSIS — G894 Chronic pain syndrome: Secondary | ICD-10-CM | POA: Diagnosis not present

## 2023-08-02 DIAGNOSIS — Z981 Arthrodesis status: Secondary | ICD-10-CM | POA: Diagnosis not present

## 2023-08-02 DIAGNOSIS — M4105 Infantile idiopathic scoliosis, thoracolumbar region: Secondary | ICD-10-CM | POA: Diagnosis not present

## 2023-08-05 ENCOUNTER — Other Ambulatory Visit: Payer: Self-pay

## 2023-08-05 ENCOUNTER — Inpatient Hospital Stay
Admission: EM | Admit: 2023-08-05 | Discharge: 2023-08-07 | DRG: 948 | Disposition: A | Discharge status: Home / Self Care | Attending: Internal Medicine | Admitting: Internal Medicine

## 2023-08-05 DIAGNOSIS — J392 Other diseases of pharynx: Secondary | ICD-10-CM | POA: Diagnosis not present

## 2023-08-05 DIAGNOSIS — J3501 Chronic tonsillitis: Secondary | ICD-10-CM | POA: Diagnosis not present

## 2023-08-05 DIAGNOSIS — Z9089 Acquired absence of other organs: Secondary | ICD-10-CM | POA: Diagnosis not present

## 2023-08-05 DIAGNOSIS — Q766 Other congenital malformations of ribs: Secondary | ICD-10-CM | POA: Diagnosis not present

## 2023-08-05 DIAGNOSIS — G8918 Other acute postprocedural pain: Principal | ICD-10-CM | POA: Diagnosis present

## 2023-08-05 DIAGNOSIS — R0682 Tachypnea, not elsewhere classified: Secondary | ICD-10-CM | POA: Diagnosis present

## 2023-08-05 DIAGNOSIS — Q7649 Other congenital malformations of spine, not associated with scoliosis: Secondary | ICD-10-CM | POA: Diagnosis not present

## 2023-08-05 DIAGNOSIS — I10 Essential (primary) hypertension: Secondary | ICD-10-CM | POA: Diagnosis not present

## 2023-08-05 DIAGNOSIS — Z981 Arthrodesis status: Secondary | ICD-10-CM

## 2023-08-05 DIAGNOSIS — G894 Chronic pain syndrome: Secondary | ICD-10-CM | POA: Diagnosis present

## 2023-08-05 DIAGNOSIS — Z8249 Family history of ischemic heart disease and other diseases of the circulatory system: Secondary | ICD-10-CM

## 2023-08-05 DIAGNOSIS — M542 Cervicalgia: Secondary | ICD-10-CM | POA: Diagnosis not present

## 2023-08-05 DIAGNOSIS — R000 Tachycardia, unspecified: Secondary | ICD-10-CM | POA: Diagnosis present

## 2023-08-05 DIAGNOSIS — Z79899 Other long term (current) drug therapy: Secondary | ICD-10-CM

## 2023-08-05 DIAGNOSIS — R221 Localized swelling, mass and lump, neck: Secondary | ICD-10-CM | POA: Diagnosis not present

## 2023-08-05 MED ORDER — SODIUM CHLORIDE 0.9 % IV BOLUS
1000.0000 mL | Freq: Once | INTRAVENOUS | Status: AC
  Administered 2023-08-06: 1000 mL via INTRAVENOUS

## 2023-08-05 MED ORDER — METHYLPREDNISOLONE SODIUM SUCC 125 MG IJ SOLR
125.0000 mg | Freq: Once | INTRAMUSCULAR | Status: AC
  Administered 2023-08-06: 125 mg via INTRAVENOUS
  Filled 2023-08-05: qty 2

## 2023-08-05 MED ORDER — MORPHINE SULFATE (PF) 2 MG/ML IV SOLN
2.0000 mg | Freq: Once | INTRAVENOUS | Status: AC
  Administered 2023-08-06: 2 mg via INTRAVENOUS
  Filled 2023-08-05: qty 1

## 2023-08-05 MED ORDER — ONDANSETRON HCL 4 MG/2ML IJ SOLN
4.0000 mg | Freq: Once | INTRAMUSCULAR | Status: AC
  Administered 2023-08-06: 4 mg via INTRAVENOUS
  Filled 2023-08-05: qty 2

## 2023-08-05 NOTE — ED Triage Notes (Signed)
 Pt had tonsillectomy this morning at [facility], since pt got home pt has had inc pain in swelling to throat. Pt states she has a lot of mucus production. Denies shortness of breath

## 2023-08-05 NOTE — ED Provider Notes (Incomplete)
 [facility] Provider Note    Event Date/Time   First MD Initiated Contact with Patient 08/05/23 2313     (approximate)   History   Post-op Problem   HPI  Brianna Arnold is a 33 y.o. female who presents to the ED from home with a chief complaint of post tonsillectomy pain and swelling.  Patient had tonsillectomy this morning at [facility].  Since she got home she has been unable to tolerate liquids and thus unable to tolerate her steroid or pain medicine.  Feels more swollen in her neck area.  Unable to tolerate her secretions.  Called her surgery service who wanted patient to come to [facility] but she felt it was too long of a drive so was directed to come to her local ED for evaluation and steroids.  Denies fever/chills, chest pain, shortness of breath, abdominal pain, vomiting or dizziness.     Past Medical History   Past Medical History:  Diagnosis Date  . Anemia   . Jarcho-Levin syndrome      Active Problem List   Patient Active Problem List   Diagnosis Date Noted  . Greater trochanteric pain syndrome of right lower extremity 03/02/2022  . Greater trochanteric pain syndrome of left lower extremity 03/02/2022  . Osteoarthritis of right hip 02/09/2022  . Chronic hip pain, bilateral 02/09/2022  . Chronic SI joint pain 02/09/2022  . Myofascial pain syndrome of lumbar spine 09/08/2020  . Chronic pain syndrome 01/21/2020  . Disorder of left rotator cuff 12/03/2019  . Autosomal recessive spondylocostal dysostosis 10/15/2019  . History of lumbar spinal fusion (T11- iliac) 10/15/2019  . Infantile idiopathic scoliosis of thoracolumbar region 10/15/2019  . Cervicalgia 10/15/2019  . Chronic left shoulder pain 10/15/2019  . Nausea and vomiting 02/14/2019  . Status post cervical spinal fusion 02/11/2019  . Medication-induced delirium, acute, mixed level of activity 04/19/2015  . Overdose 04/19/2015  . Migraine without aura and without status migrainosus,  not intractable 09/07/2014     Past Surgical History   Past Surgical History:  Procedure Laterality Date  . ANTERIOR CERVICAL DECOMP/DISCECTOMY FUSION N/A 02/11/2019   Procedure: ANTERIOR CERVICAL DECOMPRESSION/DISCECTOMY FUSION 1 LEVEL C6-7;  Surgeon: Jodeen Munch, MD;  Location: [facility] ORS;  Service: Neurosurgery;  Laterality: N/A;  . BACK SURGERY  10/2014   lumber  . CERVICAL SPINE SURGERY  07/2014     Home Medications   Prior to Admission medications   Medication Sig Start Date End Date Taking? Authorizing Provider  albuterol (VENTOLIN HFA) 108 (90 Base) MCG/ACT inhaler Inhale into the lungs every 6 (six) hours as needed for wheezing or shortness of breath.    [provider]  baclofen (LIORESAL) 10 MG tablet Take 10 mg by mouth 3 (three) times daily.    [provider]  buprenorphine (BUTRANS) 5 MCG/HR PTWK Place 1 patch onto the skin once a week. 08/07/23   Lateef, Bilal, MD  Cholecalciferol (VITAMIN D3) 125 MCG (5000 UT) TABS Take 5,000 mg by mouth daily.    [provider]  cyanocobalamin (VITAMIN B12) 1000 MCG/ML injection Inject 1,000 mcg into the skin every 30 (thirty) days. 10/22/22   [provider]  cyclobenzaprine (FLEXERIL) 10 MG tablet Take 1 tablet (10 mg total) by mouth 3 (three) times daily as needed for muscle spasms. 02/15/19   Verla Glaze, MD  ergocalciferol (VITAMIN D2) 1.25 MG (50000 UT) capsule Take 1 capsule by mouth once a week. 02/16/19   [provider]  famotidine (  PEPCID) 20 MG tablet Take 1 tablet (20 mg total) by mouth daily. 02/15/19   Verla Glaze, MD  ferrous sulfate 325 (65 FE) MG tablet Take 325 mg by mouth daily with breakfast.    [provider]  gabapentin (NEURONTIN) 250 MG/5ML solution Take 6 mLs (300 mg total) by mouth 3 (three) times daily. 08/28/22   Lateef, Bilal, MD  medroxyPROGESTERone (DEPO-PROVERA) 150 MG/ML injection Inject 150 mg into the muscle every 3 (three) months.     [provider]  oxyBUTYnin (DITROPAN) 5 MG/5ML solution Take 5 mg by mouth 2 (two) times daily. 05/06/23   [provider]  promethazine (PHENERGAN) 12.5 MG tablet Take 1 tablet (12.5 mg total) by mouth every 6 (six) hours as needed for nausea or vomiting. 02/15/19   Verla Glaze, MD  propranolol ER (INDERAL LA) 60 MG 24 hr capsule Take 60 mg by mouth daily. 12/08/20   [provider]  traMADol (ULTRAM) 50 MG tablet Take 1 tablet (50 mg total) by mouth every 12 (twelve) hours as needed for severe pain (pain score 7-10). 07/23/23 11/20/23  Lateef, Bilal, MD  venlafaxine XR (EFFEXOR-XR) 75 MG 24 hr capsule Take 75 mg by mouth daily. Patient not taking: Reported on 07/31/2023 12/28/21   [provider]  vitamin B-12 (CYANOCOBALAMIN) 100 MCG tablet Take 100 mcg by mouth daily.    [provider]  zolpidem (AMBIEN) 5 MG tablet Take 5 mg by mouth at bedtime as needed for sleep.    [provider]     Allergies  Hypafix [wound dressings], Ativan [lorazepam], and Tizanidine   Family History   Family History  Problem Relation Age of Onset  . Hypertension Father      Physical Exam  Triage Vital Signs: ED Triage Vitals  Encounter Vitals Group     BP 08/05/23 2311 (!) 141/100     Systolic BP Percentile --      Diastolic BP Percentile --      Pulse Rate 08/05/23 2311 (!) 117     Resp 08/05/23 2311 20     Temp 08/05/23 2311 98.6 F (37 C)     Temp Source 08/05/23 2311 Oral     SpO2 08/05/23 2311 99 %     Weight 08/05/23 2311 162 lb 6.4 oz (73.7 kg)     Height 08/05/23 2311 4\' 7"  (1.397 m)     Head Circumference --      Peak Flow --      Pain Score 08/05/23 2310 7     Pain Loc --      Pain Education --      Exclude from Growth Chart --     Updated Vital Signs: BP (!) 141/100   Pulse (!) 117   Temp 98.6 F (37 C) (Oral)   Resp 20   Ht 4\' 7"  (1.397 m)   Wt 73.7 kg   SpO2 99%   BMI 37.75 kg/m    General: Awake, moderate  distress.  CV:  Tachycardic.  Good peripheral perfusion.  Resp:  Normal effort.  CTAB. Abd:  Nontender.  No distention.  Other:  Posterior oropharynx consistent with post tonsillectomy with whitish eschars.  No bleeding.  Mildly muffled voice.  Spitting into an emesis bag.  Mild diffuse swelling of neck.   ED Results / Procedures / Treatments  Labs (all labs ordered are listed, but only abnormal results are displayed) Labs Reviewed  CBC WITH DIFFERENTIAL/PLATELET  BASIC METABOLIC PANEL WITH GFR  EKG  None   RADIOLOGY I have independently visualized and interpreted patient's imaging study as well as noted the radiology interpretation:  {You MUST document your own interpretation of imaging, as well as the fact that you reviewed the radiologist's report!:1}  Official radiology report(s): No results found.   PROCEDURES:  Critical Care performed: {CriticalCareYesNo:19197::"Yes, see critical care procedure note(s)","No"}  .1-3 Lead EKG Interpretation  Performed by: Norlene Beavers, MD Authorized by: Norlene Beavers, MD     Interpretation: abnormal     ECG rate:  115   ECG rate assessment: tachycardic     Rhythm: sinus tachycardia     Ectopy: none     Conduction: normal   Comments:     Patient placed on cardiac monitor to evaluate for arrhythmias    MEDICATIONS ORDERED IN ED: Medications  sodium chloride 0.9 % bolus 1,000 mL (has no administration in time range)  ondansetron (ZOFRAN) injection 4 mg (has no administration in time range)  morphine (PF) 2 MG/ML injection 2 mg (has no administration in time range)  methylPREDNISolone sodium succinate (SOLU-MEDROL) 125 mg/2 mL injection 125 mg (has no administration in time range)     IMPRESSION / MDM / ASSESSMENT AND PLAN / ED COURSE  I reviewed the triage vital signs and the nursing notes.                             33 year old female presenting with post tonsillectomy pain and swelling.  Differential diagnosis  includes but is not limited to hematoma, abscess, postoperative edema, etc.  I personally reviewed patient's records and note her postop note and telephone communication with ENT service from today.  Patient's presentation is most consistent with acute complicated illness / injury requiring diagnostic workup.  {**The patient is on the cardiac monitor to evaluate for evidence of arrhythmia and/or significant heart rate changes.  {Remember to include, when applicable, any/all of the following data: independent review of imaging independent review of labs (comment specifically on pertinent positives and negatives) review of specific prior hospitalizations, PCP/specialist notes, etc. discuss meds given and prescribed document any discussion with consultants (including hospitalists) any clinical decision tools you used and why (PECARN, NEXUS, etc.) did you consider admitting the patient? document social determinants of health affecting patient's care (homelessness, inability to follow up in a timely fashion, etc) document any pre-existing conditions increasing risk on current visit (e.g. diabetes and HTN increasing danger of high-risk chest pain/ACS) describes what meds you gave (especially parenteral) and why any other interventions?:1}      FINAL CLINICAL IMPRESSION(S) / ED DIAGNOSES   Final diagnoses:  None     Rx / DC Orders   ED Discharge Orders     None        Note:  This document was prepared using Dragon voice recognition software and may include unintentional dictation errors.

## 2023-08-05 NOTE — ED Provider Notes (Signed)
 [facility] Provider Note    Event Date/Time   First MD Initiated Contact with Patient 08/05/23 2313     (approximate)   History   Post-op Problem   HPI  Brianna Arnold is a 33 y.o. female who presents to the ED from home with a chief complaint of post tonsillectomy pain and swelling.  Patient had tonsillectomy this morning at [facility].  Since she got home she has been unable to tolerate liquids and thus unable to tolerate her steroid or pain medicine.  Feels more swollen in her neck area.  Unable to tolerate her secretions.  Called her surgery service who wanted patient to come to [facility] but she felt it was too long of a drive so was directed to come to her local ED for evaluation and steroids.  Denies fever/chills, chest pain, shortness of breath, abdominal pain, vomiting or dizziness.     Past Medical History   Past Medical History:  Diagnosis Date   Anemia    Jarcho-Levin syndrome      Active Problem List   Patient Active Problem List   Diagnosis Date Noted   Greater trochanteric pain syndrome of right lower extremity 03/02/2022   Greater trochanteric pain syndrome of left lower extremity 03/02/2022   Osteoarthritis of right hip 02/09/2022   Chronic hip pain, bilateral 02/09/2022   Chronic SI joint pain 02/09/2022   Myofascial pain syndrome of lumbar spine 09/08/2020   Chronic pain syndrome 01/21/2020   Disorder of left rotator cuff 12/03/2019   Autosomal recessive spondylocostal dysostosis 10/15/2019   History of lumbar spinal fusion (T11- iliac) 10/15/2019   Infantile idiopathic scoliosis of thoracolumbar region 10/15/2019   Cervicalgia 10/15/2019   Chronic left shoulder pain 10/15/2019   Nausea and vomiting 02/14/2019   Status post cervical spinal fusion 02/11/2019   Medication-induced delirium, acute, mixed level of activity 04/19/2015   Overdose 04/19/2015   Migraine without aura and without status migrainosus, not intractable  09/07/2014     Past Surgical History   Past Surgical History:  Procedure Laterality Date   ANTERIOR CERVICAL DECOMP/DISCECTOMY FUSION N/A 02/11/2019   Procedure: ANTERIOR CERVICAL DECOMPRESSION/DISCECTOMY FUSION 1 LEVEL C6-7;  Surgeon: Jodeen Munch, MD;  Location: [facility] ORS;  Service: Neurosurgery;  Laterality: N/A;   BACK SURGERY  10/2014   lumber   CERVICAL SPINE SURGERY  07/2014     Home Medications   Prior to Admission medications   Medication Sig Start Date End Date Taking? Authorizing Provider  albuterol (VENTOLIN HFA) 108 (90 Base) MCG/ACT inhaler Inhale into the lungs every 6 (six) hours as needed for wheezing or shortness of breath.    [provider]  baclofen (LIORESAL) 10 MG tablet Take 10 mg by mouth 3 (three) times daily.    [provider]  buprenorphine (BUTRANS) 5 MCG/HR PTWK Place 1 patch onto the skin once a week. 08/07/23   Lateef, Bilal, MD  Cholecalciferol (VITAMIN D3) 125 MCG (5000 UT) TABS Take 5,000 mg by mouth daily.    [provider]  cyanocobalamin (VITAMIN B12) 1000 MCG/ML injection Inject 1,000 mcg into the skin every 30 (thirty) days. 10/22/22   [provider]  cyclobenzaprine (FLEXERIL) 10 MG tablet Take 1 tablet (10 mg total) by mouth 3 (three) times daily as needed for muscle spasms. 02/15/19   Verla Glaze, MD  ergocalciferol (VITAMIN D2) 1.25 MG (50000 UT) capsule Take 1 capsule by mouth once a week. 02/16/19   [provider]  famotidine (  PEPCID) 20 MG tablet Take 1 tablet (20 mg total) by mouth daily. 02/15/19   Verla Glaze, MD  ferrous sulfate 325 (65 FE) MG tablet Take 325 mg by mouth daily with breakfast.    [provider]  gabapentin (NEURONTIN) 250 MG/5ML solution Take 6 mLs (300 mg total) by mouth 3 (three) times daily. 08/28/22   Lateef, Bilal, MD  medroxyPROGESTERone (DEPO-PROVERA) 150 MG/ML injection Inject 150 mg into the muscle every 3 (three) months.    [provider]  oxyBUTYnin (DITROPAN) 5 MG/5ML solution Take 5 mg by mouth 2 (two) times daily. 05/06/23   [provider]  promethazine (PHENERGAN) 12.5 MG tablet Take 1 tablet (12.5 mg total) by mouth every 6 (six) hours as needed for nausea or vomiting. 02/15/19   Verla Glaze, MD  propranolol ER (INDERAL LA) 60 MG 24 hr capsule Take 60 mg by mouth daily. 12/08/20   [provider]  traMADol (ULTRAM) 50 MG tablet Take 1 tablet (50 mg total) by mouth every 12 (twelve) hours as needed for severe pain (pain score 7-10). 07/23/23 11/20/23  Lateef, Bilal, MD  venlafaxine XR (EFFEXOR-XR) 75 MG 24 hr capsule Take 75 mg by mouth daily. Patient not taking: Reported on 07/31/2023 12/28/21   [provider]  vitamin B-12 (CYANOCOBALAMIN) 100 MCG tablet Take 100 mcg by mouth daily.    [provider]  zolpidem (AMBIEN) 5 MG tablet Take 5 mg by mouth at bedtime as needed for sleep.    [provider]     Allergies  Hypafix [wound dressings], Ativan [lorazepam], and Tizanidine   Family History   Family History  Problem Relation Age of Onset   Hypertension Father      Physical Exam  Triage Vital Signs: ED Triage Vitals  Encounter Vitals Group     BP 08/05/23 2311 (!) 141/100     Systolic BP Percentile --      Diastolic BP Percentile --      Pulse Rate 08/05/23 2311 (!) 117     Resp 08/05/23 2311 20     Temp 08/05/23 2311 98.6 F (37 C)     Temp Source 08/05/23 2311 Oral     SpO2 08/05/23 2311 99 %     Weight 08/05/23 2311 162 lb 6.4 oz (73.7 kg)     Height 08/05/23 2311 4\' 7"  (1.397 m)     Head Circumference --      Peak Flow --      Pain Score 08/05/23 2310 7     Pain Loc --      Pain Education --      Exclude from Growth Chart --     Updated Vital Signs: BP 124/83   Pulse 94   Temp 98.6 F (37 C) (Oral)   Resp (!) 23   Ht 4\' 7"  (1.397 m)   Wt 73.7 kg   SpO2 100%   BMI 37.75 kg/m    General: Awake, moderate distress.   CV:  Tachycardic.  Good peripheral perfusion.  Resp:  Normal effort.  CTAB. Abd:  Nontender.  No distention.  Other:  Posterior oropharynx consistent with post tonsillectomy with whitish eschars.  No bleeding.  Mildly muffled voice.  Spitting into an emesis bag.  Mild diffuse swelling of neck.   ED Results / Procedures / Treatments  Labs (all labs ordered are listed, but only abnormal results are displayed) Labs Reviewed  CBC WITH DIFFERENTIAL/PLATELET - Abnormal; Notable for the following components:  Result Value   WBC 16.2 (*)    Neutro Abs 14.1 (*)    All other components within normal limits  BASIC METABOLIC PANEL WITH GFR - Abnormal; Notable for the following components:   CO2 21 (*)    Glucose, Bld 118 (*)    All other components within normal limits     EKG  None   RADIOLOGY I have independently visualized and interpreted patient's imaging study as well as noted the radiology interpretation:  CT soft tissue neck: Postoperative nonspecific mild edema, no discrete drainable fluid collection.  Cannot exclude superimposed infection.  Official radiology report(s): CT Soft Tissue Neck W Contrast Result Date: 08/06/2023 CLINICAL DATA:  post-op swelling/pain EXAM: CT NECK WITH CONTRAST TECHNIQUE: Multidetector CT imaging of the neck was performed using the standard protocol following the bolus administration of intravenous contrast. RADIATION DOSE REDUCTION: This exam was performed according to the departmental dose-optimization program which includes automated exposure control, adjustment of the mA and/or kV according to patient size and/or use of iterative reconstruction technique. CONTRAST:  75mL OMNIPAQUE IOHEXOL 300 MG/ML  SOLN COMPARISON:  None Available. FINDINGS: Pharynx and larynx: Status post tonsillectomy with nonspecific mild edema in the surrounding oropharynx. No discrete, drainable fluid collection. No mass lesion. Salivary glands: No inflammation, mass, or  stone. Thyroid: Normal. Lymph nodes: Mildly prominent upper cervical chain lymph nodes, likely reactive but nonspecific. Vascular: Unremarkable on limited assessment due to non arterial timing. Limited intracranial: Negative. Visualized orbits: Negative. Mastoids and visualized paranasal sinuses: Largely clear sinuses. No mastoid effusions. 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 Upper chest: Visualized lung apices are clear. IMPRESSION: Status post tonsillectomy with nonspecific mild edema in the surrounding oropharynx. This could be postoperative given recent surgery, but superimposed infection is not excluded. No discrete, drainable fluid collection. Electronically Signed   By: Feliberto Harts M.D.   On: 08/06/2023 01:20     PROCEDURES:  Critical Care performed: Yes, see critical care procedure note(s)  CRITICAL CARE Performed by: Irean Hong   Total critical care time: 30 minutes  Critical care time was exclusive of separately billable procedures and treating other patients.  Critical care was necessary to treat or prevent imminent or life-threatening deterioration.  Critical care was time spent personally by me on the following activities: development of treatment plan with patient and/or surrogate as well as nursing, discussions with consultants, evaluation of patient's response to treatment, examination of patient, obtaining history from patient or surrogate, ordering and performing treatments and interventions, ordering and review of laboratory studies, ordering and review of radiographic studies, pulse oximetry and re-evaluation of patient's condition.   Marland Kitchen1-3 Lead EKG Interpretation  Performed by: Irean Hong, MD Authorized by: Irean Hong, MD     Interpretation: abnormal     ECG rate:  115   ECG rate assessment: tachycardic     Rhythm: sinus tachycardia     Ectopy: none     Conduction: normal   Comments:     Patient placed  on cardiac monitor to evaluate for arrhythmias    MEDICATIONS ORDERED IN ED: Medications  Ampicillin-Sulbactam (UNASYN) 3 g in sodium chloride 0.9 % 100 mL IVPB (has no administration in time range)  sodium chloride 0.9 % bolus 1,000 mL (1,000 mLs Intravenous New Bag/Given 08/06/23 0024)  ondansetron (ZOFRAN) injection 4 mg (4 mg Intravenous Given 08/06/23 0010)  morphine (PF) 2 MG/ML injection 2 mg (2 mg Intravenous Given 08/06/23 0011)  methylPREDNISolone sodium succinate (SOLU-MEDROL) 125  mg/2 mL injection 125 mg (125 mg Intravenous Given 08/06/23 0010)  iohexol (OMNIPAQUE) 300 MG/ML solution 75 mL (75 mLs Intravenous Contrast Given 08/06/23 0101)     IMPRESSION / MDM / ASSESSMENT AND PLAN / ED COURSE  I reviewed the triage vital signs and the nursing notes.                             33 year old female presenting with post tonsillectomy pain and swelling.  Differential diagnosis includes but is not limited to hematoma, abscess, postoperative edema, etc.  I personally reviewed patient's records and note her postop note and telephone communication with ENT service from today.  Patient's presentation is most consistent with acute complicated illness / injury requiring diagnostic workup.  The patient is on the cardiac monitor to evaluate for evidence of arrhythmia and/or significant heart rate changes.  Patient shows me that when she tries to sip water, she has to spit it back out.  Will obtain lab work, keep NPO, initiate IV fluid resuscitation, IV Solu-Medrol, IV morphine for pain.  Will obtain CT soft tissue neck to evaluate for postoperative complication.  Will reassess.  Clinical Course as of 08/06/23 0153  Tue Aug 06, 2023  0152 Updated patient and mother on CT results.  Patient now able to take tiny sips of water without spitting up.  Will cover empirically with antibiotics.  Will consult hospitalist services for evaluation and admission. [JS]    Clinical Course User Index [JS]  Norlene Beavers, MD     FINAL CLINICAL IMPRESSION(S) / ED DIAGNOSES   Final diagnoses:  Post-operative pain     Rx / DC Orders   ED Discharge Orders     None        Note:  This document was prepared using Dragon voice recognition software and may include unintentional dictation errors.   Calene Paradiso J, MD 08/06/23 [phone]

## 2023-08-06 ENCOUNTER — Emergency Department

## 2023-08-06 DIAGNOSIS — Q7649 Other congenital malformations of spine, not associated with scoliosis: Secondary | ICD-10-CM | POA: Diagnosis not present

## 2023-08-06 DIAGNOSIS — R0682 Tachypnea, not elsewhere classified: Secondary | ICD-10-CM | POA: Diagnosis present

## 2023-08-06 DIAGNOSIS — Z981 Arthrodesis status: Secondary | ICD-10-CM | POA: Diagnosis not present

## 2023-08-06 DIAGNOSIS — Q798 Other congenital malformations of musculoskeletal system: Secondary | ICD-10-CM | POA: Insufficient documentation

## 2023-08-06 DIAGNOSIS — R000 Tachycardia, unspecified: Secondary | ICD-10-CM | POA: Diagnosis present

## 2023-08-06 DIAGNOSIS — G8918 Other acute postprocedural pain: Principal | ICD-10-CM

## 2023-08-06 DIAGNOSIS — Z9089 Acquired absence of other organs: Secondary | ICD-10-CM | POA: Diagnosis not present

## 2023-08-06 DIAGNOSIS — Z8249 Family history of ischemic heart disease and other diseases of the circulatory system: Secondary | ICD-10-CM | POA: Diagnosis not present

## 2023-08-06 DIAGNOSIS — G894 Chronic pain syndrome: Secondary | ICD-10-CM | POA: Diagnosis present

## 2023-08-06 DIAGNOSIS — Z79899 Other long term (current) drug therapy: Secondary | ICD-10-CM | POA: Diagnosis not present

## 2023-08-06 DIAGNOSIS — Q766 Other congenital malformations of ribs: Secondary | ICD-10-CM | POA: Diagnosis not present

## 2023-08-06 LAB — CBC WITH DIFFERENTIAL/PLATELET
Abs Immature Granulocytes: 0.06 10*3/uL (ref 0.00–0.07)
Basophils Absolute: 0 10*3/uL (ref 0.0–0.1)
Basophils Relative: 0 %
Eosinophils Absolute: 0 10*3/uL (ref 0.0–0.5)
Eosinophils Relative: 0 %
HCT: 40.2 % (ref 36.0–46.0)
Hemoglobin: 14 g/dL (ref 12.0–15.0)
Immature Granulocytes: 0 %
Lymphocytes Relative: 7 %
Lymphs Abs: 1.1 10*3/uL (ref 0.7–4.0)
MCH: 31.1 pg (ref 26.0–34.0)
MCHC: 34.8 g/dL (ref 30.0–36.0)
MCV: 89.3 fL (ref 80.0–100.0)
Monocytes Absolute: 1 10*3/uL (ref 0.1–1.0)
Monocytes Relative: 6 %
Neutro Abs: 14.1 10*3/uL — ABNORMAL HIGH (ref 1.7–7.7)
Neutrophils Relative %: 87 %
Platelets: 282 10*3/uL (ref 150–400)
RBC: 4.5 MIL/uL (ref 3.87–5.11)
RDW: 11.7 % (ref 11.5–15.5)
WBC: 16.2 10*3/uL — ABNORMAL HIGH (ref 4.0–10.5)
nRBC: 0 % (ref 0.0–0.2)

## 2023-08-06 LAB — BASIC METABOLIC PANEL WITH GFR
Anion gap: 7 (ref 5–15)
BUN: 9 mg/dL (ref 6–20)
CO2: 21 mmol/L — ABNORMAL LOW (ref 22–32)
Calcium: 9.1 mg/dL (ref 8.9–10.3)
Chloride: 108 mmol/L (ref 98–111)
Creatinine, Ser: 0.8 mg/dL (ref 0.44–1.00)
GFR, Estimated: >60 mL/min (ref >60)
Glucose, Bld: 118 mg/dL — ABNORMAL HIGH (ref 70–99)
Potassium: 4 mmol/L (ref 3.5–5.1)
Sodium: 136 mmol/L (ref 135–145)

## 2023-08-06 MED ORDER — IOHEXOL 300 MG/ML  SOLN
75.0000 mL | Freq: Once | INTRAMUSCULAR | Status: AC | PRN
  Administered 2023-08-06: 75 mL via INTRAVENOUS

## 2023-08-06 MED ORDER — BUPRENORPHINE 5 MCG/HR TD PTWK
1.0000 | MEDICATED_PATCH | TRANSDERMAL | Status: DC
  Administered 2023-08-07: 1 via TRANSDERMAL
  Filled 2023-08-06: qty 1

## 2023-08-06 MED ORDER — OXYCODONE HCL 5 MG PO TABS: 5.0000 mg | ORAL_TABLET | ORAL | Status: DC | PRN

## 2023-08-06 MED ORDER — SODIUM CHLORIDE 0.9 % IV SOLN
3.0000 g | Freq: Four times a day (QID) | INTRAVENOUS | Status: DC
  Administered 2023-08-06 – 2023-08-07 (×5): 3 g via INTRAVENOUS
  Filled 2023-08-06 (×7): qty 8

## 2023-08-06 MED ORDER — ONDANSETRON HCL 4 MG/2ML IJ SOLN: 4.0000 mg | Freq: Four times a day (QID) | INTRAMUSCULAR | Status: DC | PRN

## 2023-08-06 MED ORDER — SODIUM CHLORIDE 0.9 % IV SOLN
3.0000 g | Freq: Once | INTRAVENOUS | Status: AC
  Administered 2023-08-06: 3 g via INTRAVENOUS
  Filled 2023-08-06: qty 8

## 2023-08-06 MED ORDER — ONDANSETRON HCL 4 MG PO TABS: 4.0000 mg | ORAL_TABLET | Freq: Four times a day (QID) | ORAL | Status: DC | PRN

## 2023-08-06 MED ORDER — ACETAMINOPHEN 650 MG RE SUPP
650.0000 mg | Freq: Four times a day (QID) | RECTAL | Status: DC | PRN
Start: 2023-08-06 — End: 2023-08-07

## 2023-08-06 MED ORDER — KETOROLAC TROMETHAMINE 30 MG/ML IJ SOLN
30.0000 mg | Freq: Four times a day (QID) | INTRAMUSCULAR | Status: DC | PRN
  Administered 2023-08-06 – 2023-08-07 (×3): 30 mg via INTRAVENOUS
  Filled 2023-08-06 (×3): qty 1

## 2023-08-06 MED ORDER — DEXAMETHASONE SODIUM PHOSPHATE 10 MG/ML IJ SOLN
8.0000 mg | Freq: Three times a day (TID) | INTRAMUSCULAR | Status: DC
  Administered 2023-08-06 – 2023-08-07 (×4): 8 mg via INTRAVENOUS
  Filled 2023-08-06: qty 1
  Filled 2023-08-06 (×3): qty 0.8
  Filled 2023-08-06: qty 1

## 2023-08-06 MED ORDER — ACETAMINOPHEN 325 MG PO TABS
650.0000 mg | ORAL_TABLET | Freq: Four times a day (QID) | ORAL | Status: DC | PRN
  Administered 2023-08-07: 325 mg via ORAL
  Filled 2023-08-06: qty 2

## 2023-08-06 MED ORDER — SODIUM CHLORIDE 0.9 % IV SOLN: INTRAVENOUS | Status: DC | PRN

## 2023-08-06 NOTE — Progress Notes (Signed)
 SLP Cancellation Note  Patient Details Name: Brianna Arnold MRN: [id number] DOB: [birth year]   Cancelled treatment:       Reason Eval/Treat Not Completed: Patient declined, no reason specified (Given documented refusal, SLP will s/o.)  Dia Forget, M.S., CCC-SLP Speech-Language Pathologist [facility] [phone] [place])  [name] 08/06/2023, 11:31 AM

## 2023-08-06 NOTE — Assessment & Plan Note (Addendum)
 Decadron, Unasyn, pain control Soft diet Can consider ENT consult-follows with Dr. Creighton Vaughn

## 2023-08-06 NOTE — ED Notes (Signed)
 Pt refuses SLP evaluation. States, "I don't need that." Pt and mother at bedside educated on purpose of SLP consult and the role of speech therapy in the swallowing process, continues to refuse. MD and SLP made aware.

## 2023-08-06 NOTE — Care Management Obs Status (Signed)
 MEDICARE OBSERVATION STATUS NOTIFICATION   Patient Details  Name: Brianna Arnold MRN: [id number] Date of Birth: Nov 04, 1990   Medicare Observation Status Notification Given:  Rudolph Cost, CMA 08/06/2023, 1:52 PM

## 2023-08-06 NOTE — ED Notes (Addendum)
 Patient and family refused HIV antibody testing at this time. Vallarie Gauze, MD made aware.

## 2023-08-06 NOTE — Progress Notes (Signed)
 Patient refused HIV testing. MD made aware and order taken out.

## 2023-08-06 NOTE — Assessment & Plan Note (Signed)
 Continue buprenorphine, venlafaxine, trazodone and muscle relaxant

## 2023-08-06 NOTE — H&P (Signed)
 History and Physical    Patient: Brianna Arnold YQM:[id number] DOB: 1990-11-23 DOA: 08/05/2023 DOS: the patient was seen and examined on 08/06/2023 PCP: Yehuda Helms, MD  Patient coming from: Home  Chief Complaint:  Chief Complaint  Patient presents with   Post-op Problem    HPI: Brianna Arnold is a 33 y.o. female with medical history significant for spondylocostal dysostosis s/p cervical fusion being admitted for intractable post-tonsillectomy pain, with pain on swallowing and difficulty controlling secretions.  She underwent robotic tonsillectomy yesterday at [facility], referred there due to limited neck mobility.  She contacted her ENT who referred her to the hospital for IVF and steroids. CT neck shows mild post-op swelling, no drainable abscess but "cannot exclude superimposed infection". Mother at bedside contributes to history. ED course and data review.  Tachycardic to 117 mild tachypnea to the low 20s in the EDWBC 16,000 CT soft tissue neck: IMPRESSION: Status post tonsillectomy with nonspecific mild edema in the surrounding oropharynx. This could be postoperative given recent surgery, but superimposed infection is not excluded. No discrete, drainable fluid collection.  Patient started on Unasyn, given a dose of Solu-Medrol.  Hospitalist consulted for admission.  Review of Systems: As mentioned in the history of present illness. All other systems reviewed and are negative.  Past Medical History:  Diagnosis Date   Anemia    Jarcho-Levin syndrome    Past Surgical History:  Procedure Laterality Date   ANTERIOR CERVICAL DECOMP/DISCECTOMY FUSION N/A 02/11/2019   Procedure: ANTERIOR CERVICAL DECOMPRESSION/DISCECTOMY FUSION 1 LEVEL C6-7;  Surgeon: Jodeen Munch, MD;  Location: [facility] ORS;  Service: Neurosurgery;  Laterality: N/A;   BACK SURGERY  10/2014   lumber   CERVICAL SPINE SURGERY  07/2014   Social History:  reports that she has never smoked.  She has never used smokeless tobacco. She reports that she does not use drugs. No history on file for alcohol use.  Allergies  Allergen Reactions   Hypafix [Wound Dressings] Itching   Ativan [Lorazepam] Other (See Comments)    Hallucinations   Tizanidine Palpitations    Family History  Problem Relation Age of Onset   Hypertension Father     Prior to Admission medications   Medication Sig Start Date End Date Taking? Authorizing Provider  albuterol (VENTOLIN HFA) 108 (90 Base) MCG/ACT inhaler Inhale into the lungs every 6 (six) hours as needed for wheezing or shortness of breath.    [provider]  baclofen (LIORESAL) 10 MG tablet Take 10 mg by mouth 3 (three) times daily.    [provider]  buprenorphine (BUTRANS) 5 MCG/HR PTWK Place 1 patch onto the skin once a week. 08/07/23   Lateef, Bilal, MD  Cholecalciferol (VITAMIN D3) 125 MCG (5000 UT) TABS Take 5,000 mg by mouth daily.    [provider]  cyanocobalamin (VITAMIN B12) 1000 MCG/ML injection Inject 1,000 mcg into the skin every 30 (thirty) days. 10/22/22   [provider]  cyclobenzaprine (FLEXERIL) 10 MG tablet Take 1 tablet (10 mg total) by mouth 3 (three) times daily as needed for muscle spasms. 02/15/19   Verla Glaze, MD  ergocalciferol (VITAMIN D2) 1.25 MG (50000 UT) capsule Take 1 capsule by mouth once a week. 02/16/19   [provider]  famotidine (PEPCID) 20 MG tablet Take 1 tablet (20 mg total) by mouth daily. 02/15/19   Verla Glaze, MD  ferrous sulfate 325 (65 FE) MG tablet Take 325 mg by mouth daily with breakfast.    [provider]  gabapentin (NEURONTIN) 250 MG/5ML solution Take 6 mLs (300 mg total) by mouth 3 (three) times daily. 08/28/22   Edward Jolly, MD  medroxyPROGESTERone (DEPO-PROVERA) 150 MG/ML injection Inject 150 mg into the muscle every 3 (three) months.    [provider]  oxyBUTYnin (DITROPAN) 5 MG/5ML solution Take 5 mg by mouth 2  (two) times daily. 05/06/23   [provider]  promethazine (PHENERGAN) 12.5 MG tablet Take 1 tablet (12.5 mg total) by mouth every 6 (six) hours as needed for nausea or vomiting. 02/15/19   Alford Highland, MD  propranolol ER (INDERAL LA) 60 MG 24 hr capsule Take 60 mg by mouth daily. 12/08/20   [provider]  traMADol (ULTRAM) 50 MG tablet Take 1 tablet (50 mg total) by mouth every 12 (twelve) hours as needed for severe pain (pain score 7-10). 07/23/23 11/20/23  Edward Jolly, MD  venlafaxine XR (EFFEXOR-XR) 75 MG 24 hr capsule Take 75 mg by mouth daily. Patient not taking: Reported on 07/31/2023 12/28/21   [provider]  vitamin B-12 (CYANOCOBALAMIN) 100 MCG tablet Take 100 mcg by mouth daily.    [provider]  zolpidem (AMBIEN) 5 MG tablet Take 5 mg by mouth at bedtime as needed for sleep.    [provider]    Physical Exam: Vitals:   08/05/23 2311 08/05/23 2345 08/06/23 0000 08/06/23 0027  BP: (!) 141/100   124/83  Pulse: (!) 117 82 (!) 104 94  Resp: 20 (!) 25 20 (!) 23  Temp: 98.6 F (37 C)     TempSrc: Oral     SpO2: 99% 100% 97% 100%  Weight: 73.7 kg     Height: 4\' 7"  (1.397 m)      Physical Exam Vitals and nursing note reviewed.  Constitutional:      General: She is not in acute distress.    Comments: Patient awake in no distress.  Communicating with dry erase board and she is is unable to speak due to throat pain  HENT:     Head: Normocephalic and atraumatic.  Neck:     Comments: Unable to rotate neck--baseline Cardiovascular:     Rate and Rhythm: Normal rate and regular rhythm.     Heart sounds: Normal heart sounds.  Pulmonary:     Effort: Pulmonary effort is normal.     Breath sounds: Normal breath sounds.  Abdominal:     Palpations: Abdomen is soft.     Tenderness: There is no abdominal tenderness.  Neurological:     Mental Status: Mental status is at baseline.     Labs on Admission: I have personally reviewed  following labs and imaging studies  CBC: Recent Labs  Lab 08/06/23 0009  WBC 16.2*  NEUTROABS 14.1*  HGB 14.0  HCT 40.2  MCV 89.3  PLT 282   Basic Metabolic Panel: Recent Labs  Lab 08/06/23 0009  NA 136  K 4.0  CL 108  CO2 21*  GLUCOSE 118*  BUN 9  CREATININE 0.80  CALCIUM 9.1   GFR: Estimated Creatinine Clearance: 79.5 mL/min (by C-G formula based on SCr of 0.8 mg/dL). Liver Function Tests: No results for input(s): "AST", "ALT", "ALKPHOS", "BILITOT", "PROT", "ALBUMIN" in the last 168 hours. No results for input(s): "LIPASE", "AMYLASE" in the last 168 hours. No results for input(s): "AMMONIA" in the last 168 hours. Coagulation Profile: No results for input(s): "INR", "PROTIME" in the last 168 hours. Cardiac Enzymes: No results for input(s): "CKTOTAL", "CKMB", "CKMBINDEX", "TROPONINI" in the  last 168 hours. BNP (last 3 results) No results for input(s): "PROBNP" in the last 8760 hours. HbA1C: No results for input(s): "HGBA1C" in the last 72 hours. CBG: No results for input(s): "GLUCAP" in the last 168 hours. Lipid Profile: No results for input(s): "CHOL", "HDL", "LDLCALC", "TRIG", "CHOLHDL", "LDLDIRECT" in the last 72 hours. Thyroid Function Tests: No results for input(s): "TSH", "T4TOTAL", "FREET4", "T3FREE", "THYROIDAB" in the last 72 hours. Anemia Panel: No results for input(s): "VITAMINB12", "FOLATE", "FERRITIN", "TIBC", "IRON", "RETICCTPCT" in the last 72 hours. Urine analysis:    Component Value Date/Time   COLORURINE YELLOW (A) 02/14/2019 1001   APPEARANCEUR HAZY (A) 02/14/2019 1001   APPEARANCEUR Hazy 12/19/2012 1654   LABSPEC 1.016 02/14/2019 1001   LABSPEC 1.019 12/19/2012 1654   PHURINE 8.0 02/14/2019 1001   GLUCOSEU NEGATIVE 02/14/2019 1001   GLUCOSEU Negative 12/19/2012 1654   HGBUR NEGATIVE 02/14/2019 1001   BILIRUBINUR NEGATIVE 02/14/2019 1001   BILIRUBINUR Negative 12/19/2012 1654   KETONESUR 20 (A) 02/14/2019 1001   PROTEINUR NEGATIVE  02/14/2019 1001   NITRITE POSITIVE (A) 02/14/2019 1001   LEUKOCYTESUR TRACE (A) 02/14/2019 1001   LEUKOCYTESUR Trace 12/19/2012 1654    Radiological Exams on Admission: CT Soft Tissue Neck W Contrast Result Date: 08/06/2023 CLINICAL DATA:  post-op swelling/pain EXAM: CT NECK WITH CONTRAST TECHNIQUE: Multidetector CT imaging of the neck was performed using the standard protocol following the bolus administration of intravenous contrast. RADIATION DOSE REDUCTION: This exam was performed according to the departmental dose-optimization program which includes automated exposure control, adjustment of the mA and/or kV according to patient size and/or use of iterative reconstruction technique. CONTRAST:  75mL OMNIPAQUE IOHEXOL 300 MG/ML  SOLN COMPARISON:  None Available. FINDINGS: Pharynx and larynx: Status post tonsillectomy with nonspecific mild edema in the surrounding oropharynx. No discrete, drainable fluid collection. No mass lesion. Salivary glands: No inflammation, mass, or stone. Thyroid: Normal. Lymph nodes: Mildly prominent upper cervical chain lymph nodes, likely reactive but nonspecific. Vascular: Unremarkable on limited assessment due to non arterial timing. Limited intracranial: Negative. Visualized orbits: Negative. Mastoids and visualized paranasal sinuses: Largely clear sinuses. No mastoid effusions. 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 Upper chest: Visualized lung apices are clear. IMPRESSION: Status post tonsillectomy with nonspecific mild edema in the surrounding oropharynx. This could be postoperative given recent surgery, but superimposed infection is not excluded. No discrete, drainable fluid collection. Electronically Signed   By: Stevenson Elbe M.D.   On: 08/06/2023 01:20     Data Reviewed: Relevant notes from primary care and specialist visits, past discharge summaries as available in EHR, including Care Everywhere. Prior  diagnostic testing as pertinent to current admission diagnoses Updated medications and problem lists for reconciliation ED course, including vitals, labs, imaging, treatment and response to treatment Triage notes, nursing and pharmacy notes and ED provider's notes Notable results as noted in HPI   Assessment and Plan: * Post-tonsillectomy pain Decadron, Unasyn, pain control Soft diet Can consider ENT consult-follows with Dr. 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 Observation status is judged to be reasonable and necessary  in order to provide the required intensity of service to ensure the patient's safety. The patient's presenting symptoms, physical exam findings, and initial radiographic and laboratory data in the context of their medical condition is felt to place them at decreased risk for further clinical deterioration. Furthermore, it is anticipated that the patient will be medically stable for discharge from the hospital within 2 midnights of admission.   Author: Lanetta Pion, MD 08/06/2023 2:19 AM  For on call review [email or web address].

## 2023-08-06 NOTE — Assessment & Plan Note (Signed)
 S/p cervical and lumbar fusion  Patient has limited baseline cervical spine mobility

## 2023-08-06 NOTE — ED Notes (Signed)
 This RN received report from Delia Fellows RN and performed care handoff. This RN introduced self to pt. Call light in reach, bed wheels locked, side rail raised, pt updated on plan of care. Rounding completed.

## 2023-08-06 NOTE — ED Notes (Signed)
 This RN gave report to [place] RN and performed care handoff via phone. Pt updated on plan of care.

## 2023-08-07 DIAGNOSIS — G8918 Other acute postprocedural pain: Secondary | ICD-10-CM | POA: Diagnosis not present

## 2023-08-07 DIAGNOSIS — Z9089 Acquired absence of other organs: Secondary | ICD-10-CM | POA: Diagnosis not present

## 2023-08-07 LAB — CBC
HCT: 38.2 % (ref 36.0–46.0)
Hemoglobin: 13.1 g/dL (ref 12.0–15.0)
MCH: 31 pg (ref 26.0–34.0)
MCHC: 34.3 g/dL (ref 30.0–36.0)
MCV: 90.5 fL (ref 80.0–100.0)
Platelets: 257 10*3/uL (ref 150–400)
RBC: 4.22 MIL/uL (ref 3.87–5.11)
RDW: 11.9 % (ref 11.5–15.5)
WBC: 14.1 10*3/uL — ABNORMAL HIGH (ref 4.0–10.5)
nRBC: 0 % (ref 0.0–0.2)

## 2023-08-07 LAB — COMPREHENSIVE METABOLIC PANEL WITH GFR
ALT: 19 U/L (ref 0–44)
AST: 23 U/L (ref 15–41)
Albumin: 3.6 g/dL (ref 3.5–5.0)
Alkaline Phosphatase: 50 U/L (ref 38–126)
Anion gap: 4 — ABNORMAL LOW (ref 5–15)
BUN: 16 mg/dL (ref 6–20)
CO2: 24 mmol/L (ref 22–32)
Calcium: 9.1 mg/dL (ref 8.9–10.3)
Chloride: 111 mmol/L (ref 98–111)
Creatinine, Ser: 0.75 mg/dL (ref 0.44–1.00)
GFR, Estimated: >60 mL/min (ref >60)
Glucose, Bld: 153 mg/dL — ABNORMAL HIGH (ref 70–99)
Potassium: 4.6 mmol/L (ref 3.5–5.1)
Sodium: 139 mmol/L (ref 135–145)
Total Bilirubin: 0.4 mg/dL (ref 0.0–1.2)
Total Protein: 6.8 g/dL (ref 6.5–8.1)

## 2023-08-07 LAB — MAGNESIUM: Magnesium: 2.2 mg/dL (ref 1.7–2.4)

## 2023-08-07 MED ORDER — AMOXICILLIN-POT CLAVULANATE 400-57 MG/5ML PO SUSR: 400.0000 mg | Freq: Two times a day (BID) | ORAL | 0 refills | Status: AC

## 2023-08-07 NOTE — Discharge Summary (Signed)
 Physician Discharge Summary  Brianna Arnold JYN:[id number] DOB: 02-Jul-1990 DOA: 08/05/2023  PCP: Marguarite Arbour, MD  Admit date: 08/05/2023 Discharge date: 08/07/2023  Admitted From: home Disposition:  home   Recommendations for Outpatient Follow-up:  Follow up with PCP in 1-2 weeks F/u w/ ENT at [facility] w/in 1 week   Home Health: no  Equipment/Devices:  Discharge Condition: stable CODE STATUS: full  Diet recommendation: soft diet  Brief/Interim Summary: HPI was taken from Dr. Para March: Brianna Arnold is a 33 y.o. female with medical history significant for spondylocostal dysostosis s/p cervical fusion being admitted for intractable post-tonsillectomy pain, with pain on swallowing and difficulty controlling secretions.  She underwent robotic tonsillectomy yesterday at [facility], referred there due to limited neck mobility.  She contacted her ENT who referred her to the hospital for IVF and steroids. CT neck shows mild post-op swelling, no drainable abscess but "cannot exclude superimposed infection". Mother at bedside contributes to history. ED course and data review.  Tachycardic to 117 mild tachypnea to the low 20s in the EDWBC 16,000 CT soft tissue neck: IMPRESSION: Status post tonsillectomy with nonspecific mild edema in the surrounding oropharynx. This could be postoperative given recent surgery, but superimposed infection is not excluded. No discrete, drainable fluid collection.   Patient started on Unasyn, given a dose of Solu-Medrol.  Hospitalist consulted for admission.  Discharge Diagnoses:  Principal Problem:   Post-tonsillectomy pain Active Problems:   Autosomal recessive spondylocostal dysostosis   Chronic pain syndrome   Status post cervical spinal fusion  Post-tonsillectomy pain: continue on IV steroids, unasyn. Oxycodone prn.    Autosomal recessive spondylocostal dysostosis: s/p cervical and lumbar fusion. Limited baseline cervical spine  mobility   Chronic pain syndrome: continue on home dose of buprenorphine, venlafaxine, trazodone   Discharge Instructions  Discharge Instructions     Diet general   Complete by: As directed    Soft diet   Discharge instructions   Complete by: As directed    F/u w/ ENT at [facility] within 1 week. F/u w/ PCP in 1-2 weeks.   Increase activity slowly   Complete by: As directed       Allergies as of 08/07/2023       Reactions   Hypafix [wound Dressings] Itching   Ativan [lorazepam] Other (See Comments)   Hallucinations   Tizanidine Palpitations        Medication List     STOP taking these medications    baclofen 10 MG tablet Commonly known as: LIORESAL   promethazine 12.5 MG tablet Commonly known as: PHENERGAN       TAKE these medications    albuterol 108 (90 Base) MCG/ACT inhaler Commonly known as: VENTOLIN HFA Inhale into the lungs every 6 (six) hours as needed for wheezing or shortness of breath.   amoxicillin-clavulanate 400-57 MG/5ML suspension Commonly known as: AUGMENTIN Take 5 mLs (400 mg total) by mouth 2 (two) times daily for 5 days.   buprenorphine 5 MCG/HR Ptwk Commonly known as: BUTRANS Place 1 patch onto the skin once a week.   cyclobenzaprine 10 MG tablet Commonly known as: FLEXERIL Take 1 tablet (10 mg total) by mouth 3 (three) times daily as needed for muscle spasms.   ergocalciferol 1.25 MG (50000 UT) capsule Commonly known as: VITAMIN D2 Take 1 capsule by mouth once a week.   famotidine 20 MG tablet Commonly known as: PEPCID Take 1 tablet (20 mg total) by mouth daily.   ferrous sulfate 325 (65 FE) MG tablet  Take 325 mg by mouth daily with breakfast.   gabapentin 250 MG/5ML solution Commonly known as: NEURONTIN Take 6 mLs (300 mg total) by mouth 3 (three) times daily.   medroxyPROGESTERone 150 MG/ML injection Commonly known as: DEPO-PROVERA Inject 150 mg into the muscle every 3 (three) months.   oxyBUTYnin 5 MG/5ML  solution Commonly known as: DITROPAN Take 5 mg by mouth 2 (two) times daily.   prednisoLONE 15 MG/5ML Soln Commonly known as: PRELONE Take 5 mg by mouth in the morning, at noon, and at bedtime.   propranolol ER 60 MG 24 hr capsule Commonly known as: INDERAL LA Take 60 mg by mouth daily.   traMADol 50 MG tablet Commonly known as: ULTRAM Take 1 tablet (50 mg total) by mouth every 12 (twelve) hours as needed for severe pain (pain score 7-10).   venlafaxine XR 75 MG 24 hr capsule Commonly known as: EFFEXOR-XR Take 75 mg by mouth daily.   vitamin B-12 100 MCG tablet Commonly known as: CYANOCOBALAMIN Take 100 mcg by mouth daily.   cyanocobalamin 1000 MCG/ML injection Commonly known as: VITAMIN B12 Inject 1,000 mcg into the skin every 30 (thirty) days.   Vitamin D3 125 MCG (5000 UT) Tabs Take 5,000 mg by mouth daily.   zolpidem 5 MG tablet Commonly known as: AMBIEN Take 5 mg by mouth at bedtime as needed for sleep.        Allergies  Allergen Reactions   Hypafix [Wound Dressings] Itching   Ativan [Lorazepam] Other (See Comments)    Hallucinations   Tizanidine Palpitations    Consultations:    Procedures/Studies: CT Soft Tissue Neck W Contrast Result Date: 08/06/2023 CLINICAL DATA:  post-op swelling/pain EXAM: CT NECK WITH CONTRAST TECHNIQUE: Multidetector CT imaging of the neck was performed using the standard protocol following the bolus administration of intravenous contrast. RADIATION DOSE REDUCTION: This exam was performed according to the departmental dose-optimization program which includes automated exposure control, adjustment of the mA and/or kV according to patient size and/or use of iterative reconstruction technique. CONTRAST:  75mL OMNIPAQUE IOHEXOL 300 MG/ML  SOLN COMPARISON:  None Available. FINDINGS: Pharynx and larynx: Status post tonsillectomy with nonspecific mild edema in the surrounding oropharynx. No discrete, drainable fluid collection. No mass  lesion. Salivary glands: No inflammation, mass, or stone. Thyroid: Normal. Lymph nodes: Mildly prominent upper cervical chain lymph nodes, likely reactive but nonspecific. Vascular: Unremarkable on limited assessment due to non arterial timing. Limited intracranial: Negative. Visualized orbits: Negative. Mastoids and visualized paranasal sinuses: Largely clear sinuses. No mastoid effusions. 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 Upper chest: Visualized lung apices are clear. IMPRESSION: Status post tonsillectomy with nonspecific mild edema in the surrounding oropharynx. This could be postoperative given recent surgery, but superimposed infection is not excluded. No discrete, drainable fluid collection. Electronically Signed   By: Feliberto Harts M.D.   On: 08/06/2023 01:20   (Echo, Carotid, EGD, Colonoscopy, ERCP)    Subjective: Pt denies any trouble swallowing or throat pain.    Discharge Exam: Vitals:   08/06/23 2251 08/07/23 0816  BP: 111/71 118/80  Pulse: 66 63  Resp: 18 19  Temp: 98.3 F (36.8 C) 98.8 F (37.1 C)  SpO2: 99% 96%   Vitals:   08/06/23 1450 08/06/23 1617 08/06/23 2251 08/07/23 0816  BP: 107/62 109/69 111/71 118/80  Pulse: 80 69 66 63  Resp: 20 18 18 19   Temp: 98.7 F (37.1 C) 98.3 F (36.8 C) 98.3 F (36.8 C) 98.8 F (  37.1 C)  TempSrc: Oral Oral Oral Oral  SpO2: 100% 99% 99% 96%  Weight:      Height:        General: Pt is alert, awake, not in acute distress Cardiovascular: S1/S2 +, no rubs, no gallops Respiratory: CTA bilaterally, no wheezing, no rhonchi Abdominal: Soft, NT,obese, bowel sounds + Extremities: no cyanosis    The results of significant diagnostics from this hospitalization (including imaging, microbiology, ancillary and laboratory) are listed below for reference.     Microbiology: No results found for this or any previous visit (from the past 240 hours).   Labs: BNP (last 3 results) No  results for input(s): "BNP" in the last 8760 hours. Basic Metabolic Panel: Recent Labs  Lab 08/06/23 0009 08/07/23 0540  NA 136 139  K 4.0 4.6  CL 108 111  CO2 21* 24  GLUCOSE 118* 153*  BUN 9 16  CREATININE 0.80 0.75  CALCIUM 9.1 9.1  MG  --  2.2   Liver Function Tests: Recent Labs  Lab 08/07/23 0540  AST 23  ALT 19  ALKPHOS 50  BILITOT 0.4  PROT 6.8  ALBUMIN 3.6   No results for input(s): "LIPASE", "AMYLASE" in the last 168 hours. No results for input(s): "AMMONIA" in the last 168 hours. CBC: Recent Labs  Lab 08/06/23 0009 08/07/23 0540  WBC 16.2* 14.1*  NEUTROABS 14.1*  --   HGB 14.0 13.1  HCT 40.2 38.2  MCV 89.3 90.5  PLT 282 257   Cardiac Enzymes: No results for input(s): "CKTOTAL", "CKMB", "CKMBINDEX", "TROPONINI" in the last 168 hours. BNP: Invalid input(s): "POCBNP" CBG: No results for input(s): "GLUCAP" in the last 168 hours. D-Dimer No results for input(s): "DDIMER" in the last 72 hours. Hgb A1c No results for input(s): "HGBA1C" in the last 72 hours. Lipid Profile No results for input(s): "CHOL", "HDL", "LDLCALC", "TRIG", "CHOLHDL", "LDLDIRECT" in the last 72 hours. Thyroid function studies No results for input(s): "TSH", "T4TOTAL", "T3FREE", "THYROIDAB" in the last 72 hours.  Invalid input(s): "FREET3" Anemia work up No results for input(s): "VITAMINB12", "FOLATE", "FERRITIN", "TIBC", "IRON", "RETICCTPCT" in the last 72 hours. Urinalysis    Component Value Date/Time   COLORURINE YELLOW (A) 02/14/2019 1001   APPEARANCEUR HAZY (A) 02/14/2019 1001   APPEARANCEUR Hazy 12/19/2012 1654   LABSPEC 1.016 02/14/2019 1001   LABSPEC 1.019 12/19/2012 1654   PHURINE 8.0 02/14/2019 1001   GLUCOSEU NEGATIVE 02/14/2019 1001   GLUCOSEU Negative 12/19/2012 1654   HGBUR NEGATIVE 02/14/2019 1001   BILIRUBINUR NEGATIVE 02/14/2019 1001   BILIRUBINUR Negative 12/19/2012 1654   KETONESUR 20 (A) 02/14/2019 1001   PROTEINUR NEGATIVE 02/14/2019 1001   NITRITE  POSITIVE (A) 02/14/2019 1001   LEUKOCYTESUR TRACE (A) 02/14/2019 1001   LEUKOCYTESUR Trace 12/19/2012 1654   Sepsis Labs Recent Labs  Lab 08/06/23 0009 08/07/23 0540  WBC 16.2* 14.1*   Microbiology No results found for this or any previous visit (from the past 240 hours).   Time coordinating discharge: Over 30 minutes  SIGNED:   Alphonsus Jeans, MD  [facility] 08/07/2023, 1:25 PM Pager   If 7PM-7AM, please contact night-coverage [email or web address]

## 2023-08-28 ENCOUNTER — Ambulatory Visit
Attending: Student in an Organized Health Care Education/Training Program | Admitting: Student in an Organized Health Care Education/Training Program

## 2023-08-28 ENCOUNTER — Encounter: Payer: Self-pay | Admitting: Student in an Organized Health Care Education/Training Program

## 2023-08-28 VITALS — BP 147/108 | HR 90 | Temp 97.9°F | Resp 18 | Ht <= 58 in | Wt 153.0 lb

## 2023-08-28 DIAGNOSIS — M4105 Infantile idiopathic scoliosis, thoracolumbar region: Secondary | ICD-10-CM | POA: Diagnosis not present

## 2023-08-28 DIAGNOSIS — G894 Chronic pain syndrome: Secondary | ICD-10-CM | POA: Diagnosis not present

## 2023-08-28 DIAGNOSIS — M7918 Myalgia, other site: Secondary | ICD-10-CM

## 2023-08-28 MED ORDER — DIAZEPAM 5 MG PO TABS
5.0000 mg | ORAL_TABLET | ORAL | Status: AC
  Administered 2023-08-28: 5 mg via ORAL

## 2023-08-28 MED ORDER — DIAZEPAM 5 MG PO TABS
ORAL_TABLET | ORAL | Status: AC
  Filled 2023-08-28: qty 1

## 2023-08-28 MED ORDER — ROPIVACAINE HCL 2 MG/ML IJ SOLN
INTRAMUSCULAR | Status: AC
  Filled 2023-08-28: qty 20

## 2023-08-28 MED ORDER — ROPIVACAINE HCL 2 MG/ML IJ SOLN
18.0000 mL | Freq: Once | INTRAMUSCULAR | Status: AC
  Administered 2023-08-28: 18 mL via PERINEURAL

## 2023-08-28 NOTE — Progress Notes (Signed)
 PROVIDER NOTE: Information contained herein reflects review and annotations entered in association with encounter. Interpretation of such information and data should be left to medically-trained personnel. Information provided to patient can be located elsewhere in the medical record under "Patient Instructions". Document created using STT-dictation technology, any transcriptional errors that may result from process are unintentional.    Patient: Brianna Arnold  Service Category: Procedure  Provider: Cephus Collin, MD  DOB: 1990-08-21  DOS: 08/28/2023  Location: [facility]  MRN: [id number]  Setting: Ambulatory - outpatient  Referring Provider: Yehuda Helms, MD  Type: Established Patient  Specialty: Interventional Pain Management  PCP: Yehuda Helms, MD   Primary Reason for Visit: Interventional Pain Management Treatment. CC: back pain  Procedure:          Anesthesia, Analgesia, Anxiolysis:  Type: Lumbar Paraspinal Muscle Trigger Point Injection (3+)         Quadratus lumborum, erector spinae, multifidus and periscapular region and bilateral cervical/trapezius TPI CPT: 20553 Primary Purpose: Therapeutic Approach: Percutaneous, ipsilateral approach. Laterality: Midline        Type: Local Anesthesia   5 mg PO Valium  Position: Prone   Indications: 1. Chronic pain syndrome   2. Myofascial pain syndrome of lumbar spine   3. Infantile idiopathic scoliosis of thoracolumbar region       Pain Score: Pre-procedure: 7 /10 Post-procedure: 7 /10   Pre-op H&P Assessment:  Brianna Arnold is a 33 y.o. (year old), female patient, seen today for interventional treatment. She  has a past surgical history that includes Cervical spine surgery (07/2014); Back surgery (10/2014); and Anterior cervical decomp/discectomy fusion (N/A, 02/11/2019). Brianna Arnold has a current medication list which includes the following prescription(s): albuterol, buprenorphine , vitamin d3,  cyanocobalamin , cyclobenzaprine , ergocalciferol, famotidine , ferrous sulfate , gabapentin , medroxyprogesterone , oxybutynin, propranolol er, tramadol , venlafaxine xr, vitamin b-12, and zolpidem. Her primarily concern today is the Back Pain (Upper, mid and lower ) and Neck Pain (Bilateral )   Initial Vital Signs:  Pulse/HCG Rate: 94  Temp: 97.9 F (36.6 C) Resp: 16 BP: (!) 147/93 SpO2: 100 %  BMI: Estimated body mass index is 34.92 kg/m as calculated from the following:   Height as of this encounter: 4' 7.5" (1.41 m).   Weight as of this encounter: 153 lb (69.4 kg).  Risk Assessment: Allergies: Reviewed. She is allergic to hypafix [wound dressings], ativan  [lorazepam ], and tizanidine.  Allergy Precautions: None required Coagulopathies: Reviewed. None identified.  Blood-thinner therapy: None at this time Active Infection(s): Reviewed. None identified. Brianna Arnold is afebrile  Site Confirmation: Brianna Arnold was asked to confirm the procedure and laterality before marking the site Procedure checklist: Completed Consent: Before the procedure and under the influence of no sedative(s), amnesic(s), or anxiolytics, the patient was informed of the treatment options, risks and possible complications. To fulfill our ethical and legal obligations, as recommended by the American Medical Association's Code of Ethics, I have informed the patient of my clinical impression; the nature and purpose of the treatment or procedure; the risks, benefits, and possible complications of the intervention; the alternatives, including doing nothing; the risk(s) and benefit(s) of the alternative treatment(s) or procedure(s); and the risk(s) and benefit(s) of doing nothing. The patient was provided information about the general risks and possible complications associated with the procedure. These may include, but are not limited to: failure to achieve desired goals, infection, bleeding, organ or nerve damage, allergic  reactions, paralysis, and death. In addition, the patient was informed of those risks and complications  associated to the procedure, such as failure to decrease pain; infection; bleeding; organ or nerve damage with subsequent damage to sensory, motor, and/or autonomic systems, resulting in permanent pain, numbness, and/or weakness of one or several areas of the body; allergic reactions; (i.e.: anaphylactic reaction); and/or death. Furthermore, the patient was informed of those risks and complications associated with the medications. These include, but are not limited to: allergic reactions (i.e.: anaphylactic or anaphylactoid reaction(s)); adrenal axis suppression; blood sugar elevation that in diabetics may result in ketoacidosis or comma; water retention that in patients with history of congestive heart failure may result in shortness of breath, pulmonary edema, and decompensation with resultant heart failure; weight gain; swelling or edema; medication-induced neural toxicity; particulate matter embolism and blood vessel occlusion with resultant organ, and/or nervous system infarction; and/or aseptic necrosis of one or more joints. Finally, the patient was informed that Medicine is not an exact science; therefore, there is also the possibility of unforeseen or unpredictable risks and/or possible complications that may result in a catastrophic outcome. The patient indicated having understood very clearly. We have given the patient no guarantees and we have made no promises. Enough time was given to the patient to ask questions, all of which were answered to the patient's satisfaction. Brianna Arnold has indicated that she wanted to continue with the procedure. Attestation: I, the ordering provider, attest that I have discussed with the patient the benefits, risks, side-effects, alternatives, likelihood of achieving goals, and potential problems during recovery for the procedure that I have provided informed  consent. Date  Time: 08/28/2023  1:37 PM  Pre-Procedure Preparation:  Monitoring: As per clinic protocol. Respiration, ETCO2, SpO2, BP, heart rate and rhythm monitor placed and checked for adequate function Safety Precautions: Patient was assessed for positional comfort and pressure points before starting the procedure. Time-out: I initiated and conducted the "Time-out" before starting the procedure, as per protocol. The patient was asked to participate by confirming the accuracy of the "Time Out" information. Verification of the correct person, site, and procedure were performed and confirmed by me, the nursing staff, and the patient. "Time-out" conducted as per Joint Commission's Universal Protocol (UP.01.01.01). Time: 1404  Description of Procedure:          Area Prepped: Entire Lower Lumbosacral Region & Right cervical region DuraPrep (Iodine Povacrylex [0.7% available iodine] and Isopropyl Alcohol, 74% w/w) Safety Precautions: Aspiration looking for blood return was conducted prior to all injections. At no point did we inject any substances, as a needle was being advanced. No attempts were made at seeking any paresthesias. Safe injection practices and needle disposal techniques used. Medications properly checked for expiration dates. SDV (single dose vial) medications used. Description of the Procedure: Protocol guidelines were followed. The patient was placed in position over the fluoroscopy table. The target area was identified and the area prepped in the usual manner. Skin & deeper tissues infiltrated with local anesthetic. Appropriate amount of time allowed to pass for local anesthetics to take effect. The procedure needles were then advanced to the target area. Proper needle placement secured. Negative aspiration confirmed. Solution injected in intermittent fashion, asking for systemic symptoms every 0.5cc of injectate. The needles were then removed and the area cleansed, making sure to leave  some of the prepping solution back to take advantage of its long term bactericidal properties.  Vitals:   08/28/23 1338 08/28/23 1402  BP: (!) 147/93 (!) 154/107  Pulse: 94 91  Resp: 16 17  Temp: 97.9 F (36.6 C)  TempSrc: Temporal   SpO2: 100% 100%  Weight: 153 lb (69.4 kg)   Height: 4' 7.5" (1.41 m)         Start Time: 1404 hrs. End Time:   hrs. Materials:  Needle(s) Type: Regular needle Gauge: 27G Length: 1.5-in  Medication(s): Please see orders for medications and dosing details.   15 trigger points were also injected in the right and left cervical, right and left trapezius, & lumbar paraspinal region with dry needling performed.  Each trigger point was injected with 0.5 to 1 cc of 0.2% ropivacaine .   Post-operative Assessment:  Post-procedure Vital Signs:  Pulse/HCG Rate: 91  Temp: 97.9 F (36.6 C) Resp: 17 BP: (!) 154/107 SpO2: 100 %  EBL: None  Complications: No immediate post-treatment complications observed by team, or reported by patient.  Note: The patient tolerated the entire procedure well. A repeat set of vitals were taken after the procedure and the patient was kept under observation following institutional policy, for this type of procedure. Post-procedural neurological assessment was performed, showing return to baseline, prior to discharge. The patient was provided with post-procedure discharge instructions, including a section on how to identify potential problems. Should any problems arise concerning this procedure, the patient was given instructions to immediately contact us , at any time, without hesitation. In any case, we plan to contact the patient by telephone for a follow-up status report regarding this interventional procedure.  Comments:  No additional relevant information.  Plan of Care   Medications ordered for procedure: Meds ordered this encounter  Medications   ropivacaine  (PF) 2 mg/mL (0.2%) (NAROPIN ) injection 18 mL   diazepam   (VALIUM ) tablet 5 mg    Make sure Flumazenil is available in the pyxis when using this medication. If oversedation occurs, administer 0.2 mg IV over 15 sec. If after 45 sec no response, administer 0.2 mg again over 1 min; may repeat at 1 min intervals; not to exceed 4 doses (1 mg)   Pt experiencing increased right hip and right sij pain with radiation to legs No orders of the defined types were placed in this encounter.    Medications administered: We administered ropivacaine  (PF) 2 mg/mL (0.2%) and diazepam .  See the medical record for exact dosing, route, and time of administration.  Follow-up plan:   Return for PRN.       Pharmacological management options:  Opioid Analgesics: Butrans  patch, side effects at 7.5 mcg an hour, analgesic benefit with no side effects at 5 mcg an hour, continue  Membrane stabilizer: Continue gabapentin  as prescribed  Muscle relaxant: Continue Flexeril  as prescribed  NSAID: To be determined at a later time  Other analgesic(s): To be determined at a later time    Interventional management options: Brianna Arnold was informed that there is no guarantee that she would be a candidate for interventional therapies. The decision will be based on the results of diagnostic studies, as well as Brianna Arnold's risk profile.  Procedure(s) hx Left axillary peripheral nerve stimulation: 12/30/19 providing significant pain relief.  Removed 02/29/2020              Recent Visits Date Type Provider Dept  07/31/23 Procedure visit Cephus Collin, MD [facility]  07/03/23 Procedure visit Cephus Collin, MD [facility]  06/25/23 Office Visit Cephus Collin, MD [facility]  06/05/23 Procedure visit Cephus Collin, MD [facility]  Showing recent visits within past 90 days and meeting all other requirements Today's Visits Date Type Provider Dept  08/28/23 Procedure visit Cephus Collin, MD [facility]  Showing today's visits and meeting  all other requirements Future Appointments Date Type Provider Dept  10/15/23 Appointment Cephus Collin, MD [facility]  Showing future appointments within next 90 days and meeting all other requirements  Disposition: Discharge home  Discharge (Date  Time): 08/28/2023;   hrs.   Primary Care Physician: Yehuda Helms, MD Location: [facility] Note by: Cephus Collin, MD Date: 08/28/2023; Time: 2:09 PM  Disclaimer:  Medicine is not an exact science. The only guarantee in medicine is that nothing is guaranteed. It is important to note that the decision to proceed with this intervention was based on the information collected from the patient. The Data and conclusions were drawn from the patient's questionnaire, the interview, and the physical examination. Because the information was provided in large part by the patient, it cannot be guaranteed that it has not been purposely or unconsciously manipulated. Every effort has been made to obtain as much relevant data as possible for this evaluation. It is important to note that the conclusions that lead to this procedure are derived in large part from the available data. Always take into account that the treatment will also be dependent on availability of resources and existing treatment guidelines, considered by other Pain Management Practitioners as being common knowledge and practice, at the time of the intervention. For Medico-Legal purposes, it is also important to point out that variation in procedural techniques and pharmacological choices are the acceptable norm. The indications, contraindications, technique, and results of the above procedure should only be interpreted and judged by a Board-Certified Interventional Pain Specialist with extensive familiarity and expertise in the same exact procedure and technique.

## 2023-08-29 ENCOUNTER — Telehealth: Payer: Self-pay

## 2023-08-29 NOTE — Telephone Encounter (Signed)
 Post procedure follow up.  LM

## 2023-09-01 DIAGNOSIS — G894 Chronic pain syndrome: Secondary | ICD-10-CM | POA: Diagnosis not present

## 2023-09-01 DIAGNOSIS — Z981 Arthrodesis status: Secondary | ICD-10-CM | POA: Diagnosis not present

## 2023-09-01 DIAGNOSIS — M4105 Infantile idiopathic scoliosis, thoracolumbar region: Secondary | ICD-10-CM | POA: Diagnosis not present

## 2023-09-09 DIAGNOSIS — Z3042 Encounter for surveillance of injectable contraceptive: Secondary | ICD-10-CM | POA: Diagnosis not present

## 2023-09-18 ENCOUNTER — Ambulatory Visit
Attending: Student in an Organized Health Care Education/Training Program | Admitting: Student in an Organized Health Care Education/Training Program

## 2023-09-18 VITALS — BP 153/111 | HR 90 | Temp 98.0°F | Resp 15 | Ht <= 58 in | Wt 153.0 lb

## 2023-09-18 DIAGNOSIS — M7918 Myalgia, other site: Secondary | ICD-10-CM | POA: Diagnosis not present

## 2023-09-18 DIAGNOSIS — G894 Chronic pain syndrome: Secondary | ICD-10-CM | POA: Diagnosis not present

## 2023-09-18 DIAGNOSIS — M4105 Infantile idiopathic scoliosis, thoracolumbar region: Secondary | ICD-10-CM | POA: Insufficient documentation

## 2023-09-18 MED ORDER — ROPIVACAINE HCL 2 MG/ML IJ SOLN
18.0000 mL | Freq: Once | INTRAMUSCULAR | Status: AC
  Administered 2023-09-18: 18 mL via PERINEURAL

## 2023-09-18 MED ORDER — ROPIVACAINE HCL 2 MG/ML IJ SOLN
INTRAMUSCULAR | Status: AC
  Filled 2023-09-18: qty 20

## 2023-09-18 MED ORDER — DIAZEPAM 5 MG PO TABS
5.0000 mg | ORAL_TABLET | ORAL | Status: AC
  Administered 2023-09-18: 5 mg via ORAL

## 2023-09-18 MED ORDER — DIAZEPAM 5 MG PO TABS
ORAL_TABLET | ORAL | Status: AC
  Filled 2023-09-18: qty 1

## 2023-09-18 NOTE — Progress Notes (Signed)
 Safety precautions to be maintained throughout the outpatient stay will include: orient to surroundings, keep bed in low position, maintain call bell within reach at all times, provide assistance with transfer out of bed and ambulation.

## 2023-09-18 NOTE — Patient Instructions (Signed)
 ____________________________________________________________________________________________  Post-Procedure Discharge Instructions  Instructions: Apply ice:  Purpose: This will minimize any swelling and discomfort after procedure.  When: Day of procedure, as soon as you get home. How: Fill a plastic sandwich bag with crushed ice. Cover it with a small towel and apply to injection site. How long: (15 min on, 15 min off) Apply for 15 minutes then remove x 15 minutes.  Repeat sequence on day of procedure, until you go to bed. Apply heat:  Purpose: To treat any soreness and discomfort from the procedure. When: Starting the next day after the procedure. How: Apply heat to procedure site starting the day following the procedure. How long: May continue to repeat daily, until discomfort goes away. Food intake: Start with clear liquids (like water) and advance to regular food, as tolerated.  Physical activities: Keep activities to a minimum for the first 8 hours after the procedure. After that, then as tolerated. Driving: If you have received any sedation, be responsible and do not drive. You are not allowed to drive for 24 hours after having sedation. Blood thinner: (Applies only to those taking blood thinners) You may restart your blood thinner 6 hours after your procedure. Insulin: (Applies only to Diabetic patients taking insulin) As soon as you can eat, you may resume your normal dosing schedule. Infection prevention: Keep procedure site clean and dry. Shower daily and clean area with soap and water. Post-procedure Pain Diary: Extremely important that this be done correctly and accurately. Recorded information will be used to determine the next step in treatment. For the purpose of accuracy, follow these rules: Evaluate only the area treated. Do not report or include pain from an untreated area. For the purpose of this evaluation, ignore all other areas of pain, except for the treated  area. After your procedure, avoid taking a long nap and attempting to complete the pain diary after you wake up. Instead, set your alarm clock to go off every hour, on the hour, for the initial 8 hours after the procedure. Document the duration of the numbing medicine, and the relief you are getting from it. Do not go to sleep and attempt to complete it later. It will not be accurate. If you received sedation, it is likely that you were given a medication that may cause amnesia. Because of this, completing the diary at a later time may cause the information to be inaccurate. This information is needed to plan your care. Follow-up appointment: Keep your post-procedure follow-up evaluation appointment after the procedure (usually 2 weeks for most procedures, 6 weeks for radiofrequencies). DO NOT FORGET to bring you pain diary with you.   Expect: (What should I expect to see with my procedure?) From numbing medicine (AKA: Local Anesthetics): Numbness or decrease in pain. You may also experience some weakness, which if present, could last for the duration of the local anesthetic. Onset: Full effect within 15 minutes of injected. Duration: It will depend on the type of local anesthetic used. On the average, 1 to 8 hours.  From steroids (Applies only if steroids were used): Decrease in swelling or inflammation. Once inflammation is improved, relief of the pain will follow. Onset of benefits: Depends on the amount of swelling present. The more swelling, the longer it will take for the benefits to be seen. In some cases, up to 10 days. Duration: Steroids will stay in the system x 2 weeks. Duration of benefits will depend on multiple posibilities including persistent irritating factors. Side-effects: If present, they  may typically last 2 weeks (the duration of the steroids). Frequent: Cramps (if they occur, drink Gatorade and take over-the-counter Magnesium 450-500 mg once to twice a day); water retention with  temporary weight gain; increases in blood sugar; decreased immune system response; increased appetite. Occasional: Facial flushing (red, warm cheeks); mood swings; menstrual changes. Uncommon: Long-term decrease or suppression of natural hormones; bone thinning. (These are more common with higher doses or more frequent use. This is why we prefer that our patients avoid having any injection therapies in other practices.)  Very Rare: Severe mood changes; psychosis; aseptic necrosis. From procedure: Some discomfort is to be expected once the numbing medicine wears off. This should be minimal if ice and heat are applied as instructed.  Call if: (When should I call?) You experience numbness and weakness that gets worse with time, as opposed to wearing off. New onset bowel or bladder incontinence. (Applies only to procedures done in the spine)  Emergency Numbers: Durning business hours (Monday - Thursday, 8:00 AM - 4:00 PM) (Friday, 9:00 AM - 12:00 Noon): [phone] After hours: [phone] NOTE: If you are having a problem and are unable connect with, or to talk to a provider, then go to your nearest urgent care or emergency department. If the problem is serious and urgent, please call 911. ____________________________________________________________________________________________  Trigger Point Injection Trigger points are areas where you have pain. A trigger point injection is a shot given in the trigger point to help relieve pain for a few days to a few months. Common places for trigger points include the neck, shoulders, upper back, or lower back. A trigger point injection will not cure long-term (chronic) pain permanently. These injections do not always work for every person. For some people, they can help to relieve pain for a few days to a few months. Tell a health care provider about: Any allergies you have. All medicines you are taking, including vitamins, herbs, eye drops, creams,  and over-the-counter medicines. Any problems you or family members have had with anesthetic medicines. Any bleeding problems you have. Any surgeries you have had. Any medical conditions you have. Whether you are pregnant or may be pregnant. What are the risks? Generally, this is a safe procedure. However, problems may occur, including: Infection. Bleeding or bruising. Allergic reaction to the injected medicine. Irritation of the skin around the injection site. What happens before the procedure? Ask your health care provider about: Changing or stopping your regular medicines. This is especially important if you are taking diabetes medicines or blood thinners. Taking medicines such as aspirin and ibuprofen. These medicines can thin your blood. Do not take these medicines unless your health care provider tells you to take them. Taking over-the-counter medicines, vitamins, herbs, and supplements. What happens during the procedure?  Your health care provider will feel for trigger points. A marker may be used to circle the area for the injection. The skin over the trigger point will be washed with a germ-killing soap. You may be given a medicine to help you relax (sedative). A thin needle is used for the injection. You may feel pain or a twitching feeling when the needle enters your skin. A numbing solution may be injected into the trigger point. Sometimes a medicine to keep down inflammation is also injected. Your health care provider may move the needle around the area where the trigger point is located until the tightness and twitching goes away. After the injection, your health care provider may put gentle pressure  over the injection site. The injection site will be covered with a bandage (dressing). The procedure may vary among health care providers and hospitals. What can I expect after treatment? After treatment, you may have soreness and stiffness for 1-2 days. Follow these  instructions at home: Injection site care Remove your dressing in a few hours, or as told by your health care provider. Check your injection site every day for signs of infection. Check for: Redness, swelling, or pain. Fluid or blood. Warmth. Pus or a bad smell. Managing pain, stiffness, and swelling If directed, put ice on the affected area. To do this: Put ice in a plastic bag. Place a towel between your skin and the bag. Leave the ice on for 20 minutes, 2-3 times a day. Remove the ice if your skin turns bright red. This is very important. If you cannot feel pain, heat, or cold, you have a greater risk of damage to the area. Activity If you were given a sedative during the procedure, it can affect you for several hours. Do not drive or operate machinery until your health care provider says that it is safe. Do not take baths, swim, or use a hot tub until your health care provider approves. Return to your normal activities as told by your health care provider. Ask your health care provider what activities are safe for you. General instructions If you were asked to stop your regular medicines, ask your health care provider when you may start taking them again. You may be asked to see an occupational or physical therapist for exercises that reduce muscle strain and stretch the area of the trigger point. Keep all follow-up visits. This is important. Contact a health care provider if: Your pain comes back, and it is worse than before the injection. You may need more injections. You have chills or a fever. The injection site becomes more painful, red, swollen, or warm to the touch. Summary A trigger point injection is a shot given in the trigger point to help relieve pain. Common places for trigger point injections are the neck, shoulders, upper back, and lower back. These injections do not always work for every person, but for some people, the injections can help to relieve pain for a few  days to a few months. Contact a health care provider if symptoms come back or if they are worse than before treatment. Also, get help if the injection site becomes more painful, red, swollen, or warm to the touch. This information is not intended to replace advice given to you by your health care provider. Make sure you discuss any questions you have with your health care provider. Document Revised: 07/19/2020 Document Reviewed: 07/19/2020 Elsevier Patient Education  2024 [employer].

## 2023-09-18 NOTE — Progress Notes (Signed)
 PROVIDER NOTE: Information contained herein reflects review and annotations entered in association with encounter. Interpretation of such information and data should be left to medically-trained personnel. Information provided to patient can be located elsewhere in the medical record under "Patient Instructions". Document created using STT-dictation technology, any transcriptional errors that may result from process are unintentional.    Patient: Brianna Arnold  Service Category: Procedure  Provider: Cephus Collin, MD  DOB: February 13, 1991  DOS: 09/18/2023  Location: [facility]  MRN: [id number]  Setting: Ambulatory - outpatient  Referring Provider: Yehuda Helms, MD  Type: Established Patient  Specialty: Interventional Pain Management  PCP: Yehuda Helms, MD   Primary Reason for Visit: Interventional Pain Management Treatment. CC: back pain  Procedure:          Anesthesia, Analgesia, Anxiolysis:  Type: Lumbar Paraspinal Muscle Trigger Point Injection (3+)         Quadratus lumborum, erector spinae, multifidus and periscapular region and bilateral cervical/trapezius TPI CPT: 20553 Primary Purpose: Therapeutic Approach: Percutaneous, ipsilateral approach. Laterality: Midline        Type: Local Anesthesia   5 mg PO Valium  Position: Prone   Indications: 1. Chronic pain syndrome   2. Myofascial pain syndrome of lumbar spine   3. Infantile idiopathic scoliosis of thoracolumbar region       Pain Score: Pre-procedure: 6 /10 Post-procedure: 6 /10   Pre-op H&P Assessment:  Ms. Pellicano is a 33 y.o. (year old), female patient, seen today for interventional treatment. She  has a past surgical history that includes Cervical spine surgery (07/2014); Back surgery (10/2014); and Anterior cervical decomp/discectomy fusion (N/A, 02/11/2019). Ms. Cassetta has a current medication list which includes the following prescription(s): albuterol, buprenorphine , vitamin d3,  cyanocobalamin , cyclobenzaprine , ergocalciferol, famotidine , ferrous sulfate , gabapentin , medroxyprogesterone , oxybutynin, propranolol er, tramadol , venlafaxine xr, vitamin b-12, and zolpidem. Her primarily concern today is the Back Pain (bilateral)   Initial Vital Signs:  Pulse/HCG Rate: 78  Temp: 98 F (36.7 C) Resp: 16 BP: 123/78 SpO2: 100 %  BMI: Estimated body mass index is 34.92 kg/m as calculated from the following:   Height as of this encounter: 4' 7.5" (1.41 m).   Weight as of this encounter: 153 lb (69.4 kg).  Risk Assessment: Allergies: Reviewed. She is allergic to hypafix [wound dressings], ativan  [lorazepam ], and tizanidine.  Allergy Precautions: None required Coagulopathies: Reviewed. None identified.  Blood-thinner therapy: None at this time Active Infection(s): Reviewed. None identified. Ms. Althaus is afebrile  Site Confirmation: Ms. Kiger was asked to confirm the procedure and laterality before marking the site Procedure checklist: Completed Consent: Before the procedure and under the influence of no sedative(s), amnesic(s), or anxiolytics, the patient was informed of the treatment options, risks and possible complications. To fulfill our ethical and legal obligations, as recommended by the American Medical Association's Code of Ethics, I have informed the patient of my clinical impression; the nature and purpose of the treatment or procedure; the risks, benefits, and possible complications of the intervention; the alternatives, including doing nothing; the risk(s) and benefit(s) of the alternative treatment(s) or procedure(s); and the risk(s) and benefit(s) of doing nothing. The patient was provided information about the general risks and possible complications associated with the procedure. These may include, but are not limited to: failure to achieve desired goals, infection, bleeding, organ or nerve damage, allergic reactions, paralysis, and death. In addition, the  patient was informed of those risks and complications associated to the procedure, such as failure to decrease pain;  infection; bleeding; organ or nerve damage with subsequent damage to sensory, motor, and/or autonomic systems, resulting in permanent pain, numbness, and/or weakness of one or several areas of the body; allergic reactions; (i.e.: anaphylactic reaction); and/or death. Furthermore, the patient was informed of those risks and complications associated with the medications. These include, but are not limited to: allergic reactions (i.e.: anaphylactic or anaphylactoid reaction(s)); adrenal axis suppression; blood sugar elevation that in diabetics may result in ketoacidosis or comma; water retention that in patients with history of congestive heart failure may result in shortness of breath, pulmonary edema, and decompensation with resultant heart failure; weight gain; swelling or edema; medication-induced neural toxicity; particulate matter embolism and blood vessel occlusion with resultant organ, and/or nervous system infarction; and/or aseptic necrosis of one or more joints. Finally, the patient was informed that Medicine is not an exact science; therefore, there is also the possibility of unforeseen or unpredictable risks and/or possible complications that may result in a catastrophic outcome. The patient indicated having understood very clearly. We have given the patient no guarantees and we have made no promises. Enough time was given to the patient to ask questions, all of which were answered to the patient's satisfaction. Ms. Peckman has indicated that she wanted to continue with the procedure. Attestation: I, the ordering provider, attest that I have discussed with the patient the benefits, risks, side-effects, alternatives, likelihood of achieving goals, and potential problems during recovery for the procedure that I have provided informed consent. Date  Time: 09/18/2023  1:34 PM  Pre-Procedure  Preparation:  Monitoring: As per clinic protocol. Respiration, ETCO2, SpO2, BP, heart rate and rhythm monitor placed and checked for adequate function Safety Precautions: Patient was assessed for positional comfort and pressure points before starting the procedure. Time-out: I initiated and conducted the "Time-out" before starting the procedure, as per protocol. The patient was asked to participate by confirming the accuracy of the "Time Out" information. Verification of the correct person, site, and procedure were performed and confirmed by me, the nursing staff, and the patient. "Time-out" conducted as per Joint Commission's Universal Protocol (UP.01.01.01). Time: 1401  Description of Procedure:          Area Prepped: Entire Lower Lumbosacral Region & Right cervical region DuraPrep (Iodine Povacrylex [0.7% available iodine] and Isopropyl Alcohol, 74% w/w) Safety Precautions: Aspiration looking for blood return was conducted prior to all injections. At no point did we inject any substances, as a needle was being advanced. No attempts were made at seeking any paresthesias. Safe injection practices and needle disposal techniques used. Medications properly checked for expiration dates. SDV (single dose vial) medications used. Description of the Procedure: Protocol guidelines were followed. The patient was placed in position over the fluoroscopy table. The target area was identified and the area prepped in the usual manner. Skin & deeper tissues infiltrated with local anesthetic. Appropriate amount of time allowed to pass for local anesthetics to take effect. The procedure needles were then advanced to the target area. Proper needle placement secured. Negative aspiration confirmed. Solution injected in intermittent fashion, asking for systemic symptoms every 0.5cc of injectate. The needles were then removed and the area cleansed, making sure to leave some of the prepping solution back to take advantage of its  long term bactericidal properties.  Vitals:   09/18/23 1347 09/18/23 1400 09/18/23 1405  BP: 123/78 (!) 138/98 (!) 153/111  Pulse: 78 89 90  Resp: 16 18 15   Temp: 98 F (36.7 C)    SpO2: 100%  99% 100%  Weight: 153 lb (69.4 kg)    Height: 4' 7.5" (1.41 m)          Start Time: 1401 hrs. End Time:   hrs. Materials:  Needle(s) Type: Regular needle Gauge: 27G Length: 1.5-in  Medication(s): Please see orders for medications and dosing details.   15 trigger points were also injected in the right and left cervical, right and left trapezius, & lumbar paraspinal region with dry needling performed.  Each trigger point was injected with 0.5 to 1 cc of 0.2% ropivacaine .   Post-operative Assessment:  Post-procedure Vital Signs:  Pulse/HCG Rate: 90  Temp: 98 F (36.7 C) Resp: 15 BP: (!) 153/111 SpO2: 100 %  EBL: None  Complications: No immediate post-treatment complications observed by team, or reported by patient.  Note: The patient tolerated the entire procedure well. A repeat set of vitals were taken after the procedure and the patient was kept under observation following institutional policy, for this type of procedure. Post-procedural neurological assessment was performed, showing return to baseline, prior to discharge. The patient was provided with post-procedure discharge instructions, including a section on how to identify potential problems. Should any problems arise concerning this procedure, the patient was given instructions to immediately contact us , at any time, without hesitation. In any case, we plan to contact the patient by telephone for a follow-up status report regarding this interventional procedure.  Comments:  No additional relevant information.  Plan of Care   Medications ordered for procedure: Meds ordered this encounter  Medications   diazepam  (VALIUM ) tablet 5 mg    Make sure Flumazenil is available in the pyxis when using this medication. If  oversedation occurs, administer 0.2 mg IV over 15 sec. If after 45 sec no response, administer 0.2 mg again over 1 min; may repeat at 1 min intervals; not to exceed 4 doses (1 mg)   ropivacaine  (PF) 2 mg/mL (0.2%) (NAROPIN ) injection 18 mL   Pt experiencing increased right hip and right sij pain with radiation to legs No orders of the defined types were placed in this encounter.    Medications administered: We administered diazepam  and ropivacaine  (PF) 2 mg/mL (0.2%).  See the medical record for exact dosing, route, and time of administration.  Follow-up plan:   No follow-ups on file.       Pharmacological management options:  Opioid Analgesics: Butrans  patch, side effects at 7.5 mcg an hour, analgesic benefit with no side effects at 5 mcg an hour, continue  Membrane stabilizer: Continue gabapentin  as prescribed  Muscle relaxant: Continue Flexeril  as prescribed  NSAID: To be determined at a later time  Other analgesic(s): To be determined at a later time    Interventional management options: Ms. Seith was informed that there is no guarantee that she would be a candidate for interventional therapies. The decision will be based on the results of diagnostic studies, as well as Ms. Haught's risk profile.  Procedure(s) hx Left axillary peripheral nerve stimulation: 12/30/19 providing significant pain relief.  Removed 02/29/2020              Recent Visits Date Type Provider Dept  08/28/23 Procedure visit Cephus Collin, MD [facility]  07/31/23 Procedure visit Cephus Collin, MD [facility]  07/03/23 Procedure visit Cephus Collin, MD [facility]  06/25/23 Office Visit Cephus Collin, MD [facility]  Showing recent visits within past 90 days and meeting all other requirements Today's Visits Date Type Provider Dept  09/18/23 Procedure  visit Cephus Collin, MD [facility]  Showing today's visits and meeting all other requirements Future  Appointments Date Type Provider Dept  10/15/23 Appointment Cephus Collin, MD [facility]  10/16/23 Appointment Cephus Collin, MD [facility]  Showing future appointments within next 90 days and meeting all other requirements  Disposition: Discharge home  Discharge (Date  Time): 09/18/2023;   hrs.   Primary Care Physician: Yehuda Helms, MD Location: [facility] Note by: Cephus Collin, MD Date: 09/18/2023; Time: 2:16 PM  Disclaimer:  Medicine is not an exact science. The only guarantee in medicine is that nothing is guaranteed. It is important to note that the decision to proceed with this intervention was based on the information collected from the patient. The Data and conclusions were drawn from the patient's questionnaire, the interview, and the physical examination. Because the information was provided in large part by the patient, it cannot be guaranteed that it has not been purposely or unconsciously manipulated. Every effort has been made to obtain as much relevant data as possible for this evaluation. It is important to note that the conclusions that lead to this procedure are derived in large part from the available data. Always take into account that the treatment will also be dependent on availability of resources and existing treatment guidelines, considered by other Pain Management Practitioners as being common knowledge and practice, at the time of the intervention. For Medico-Legal purposes, it is also important to point out that variation in procedural techniques and pharmacological choices are the acceptable norm. The indications, contraindications, technique, and results of the above procedure should only be interpreted and judged by a Board-Certified Interventional Pain Specialist with extensive familiarity and expertise in the same exact procedure and technique.

## 2023-09-19 ENCOUNTER — Telehealth: Payer: Self-pay

## 2023-09-19 NOTE — Telephone Encounter (Signed)
 Post procedure follow up. Patient states she is doing fine.

## 2023-09-20 DIAGNOSIS — R10819 Abdominal tenderness, unspecified site: Secondary | ICD-10-CM | POA: Diagnosis not present

## 2023-09-20 DIAGNOSIS — G894 Chronic pain syndrome: Secondary | ICD-10-CM | POA: Diagnosis not present

## 2023-09-20 DIAGNOSIS — R109 Unspecified abdominal pain: Secondary | ICD-10-CM | POA: Diagnosis not present

## 2023-10-15 ENCOUNTER — Ambulatory Visit: Attending: Student in an Organized Health Care Education/Training Program | Admitting: Nurse Practitioner

## 2023-10-15 ENCOUNTER — Encounter: Payer: Self-pay | Admitting: Nurse Practitioner

## 2023-10-15 VITALS — BP 142/94 | HR 84 | Temp 97.4°F | Ht <= 58 in | Wt 153.0 lb

## 2023-10-15 DIAGNOSIS — M67912 Unspecified disorder of synovium and tendon, left shoulder: Secondary | ICD-10-CM | POA: Insufficient documentation

## 2023-10-15 DIAGNOSIS — M7918 Myalgia, other site: Secondary | ICD-10-CM | POA: Diagnosis not present

## 2023-10-15 DIAGNOSIS — M25552 Pain in left hip: Secondary | ICD-10-CM | POA: Diagnosis not present

## 2023-10-15 DIAGNOSIS — M542 Cervicalgia: Secondary | ICD-10-CM | POA: Diagnosis not present

## 2023-10-15 DIAGNOSIS — M25551 Pain in right hip: Secondary | ICD-10-CM | POA: Diagnosis not present

## 2023-10-15 DIAGNOSIS — G8929 Other chronic pain: Secondary | ICD-10-CM | POA: Insufficient documentation

## 2023-10-15 DIAGNOSIS — Z981 Arthrodesis status: Secondary | ICD-10-CM | POA: Insufficient documentation

## 2023-10-15 DIAGNOSIS — M4105 Infantile idiopathic scoliosis, thoracolumbar region: Secondary | ICD-10-CM | POA: Diagnosis not present

## 2023-10-15 DIAGNOSIS — Z79899 Other long term (current) drug therapy: Secondary | ICD-10-CM | POA: Insufficient documentation

## 2023-10-15 DIAGNOSIS — M533 Sacrococcygeal disorders, not elsewhere classified: Secondary | ICD-10-CM | POA: Diagnosis not present

## 2023-10-15 DIAGNOSIS — G894 Chronic pain syndrome: Secondary | ICD-10-CM | POA: Insufficient documentation

## 2023-10-15 MED ORDER — TRAMADOL HCL 50 MG PO TABS: 50.0000 mg | ORAL_TABLET | Freq: Two times a day (BID) | ORAL | 3 refills | Status: DC | PRN

## 2023-10-15 MED ORDER — BUPRENORPHINE 5 MCG/HR TD PTWK: 1.0000 | MEDICATED_PATCH | TRANSDERMAL | 3 refills | Status: DC

## 2023-10-15 NOTE — Progress Notes (Signed)
 PROVIDER NOTE: Interpretation of information contained herein should be left to medically-trained personnel. Specific patient instructions are provided elsewhere under Patient Instructions section of medical record. This document was created in part using AI and STT-dictation technology, any transcriptional errors that may result from this process are unintentional.  Patient: Brianna Arnold  Service: E/M   PCP: Auston Reyes BIRCH, MD  DOB: 30-Oct-1990  DOS: 10/15/2023  Provider: Emmy MARLA Blanch, NP  MRN: [id number]  Delivery: Face-to-face  Specialty: Interventional Pain Management  Type: Established Patient  Setting: Ambulatory outpatient facility  Specialty designation: 09  Referring Prov.: Auston Reyes BIRCH, MD  Location: Outpatient office facility       History of present illness (HPI) Brianna Arnold, a 33 y.o. year old female, is here today because of her Chronic pain syndrome [G89.4]. [name]'s primary complain today is Neck Pain (Lower right back)  Pertinent problems: Ms. Raczkowski has S/P cervical spine fusion; Migraine without aura and without status migrainosus, not intractable; History of lumbar spinal fusion (T11-illiac); Cervicalgia; Chronic left shoulder pain; Disorder of left rotator cuff; and Chronic pain syndrome on their pertinent problem list.   Pain Assessment: Severity of Chronic pain is reported as a 7 /10. Location: Neck  /lower right back and up to head. Onset: More than a month ago. Quality: Aching, Throbbing. Timing: Constant. Modifying factor(s): TPI, meds, ice, rest. Vitals:  height is 4' 7 (1.397 m) and weight is 153 lb (69.4 kg). Her temperature is 97.4 F (36.3 C) (abnormal). Her blood pressure is 142/94 (abnormal) and her pulse is 84. Her oxygen saturation is 100%.  BMI: Estimated body mass index is 35.56 kg/m as calculated from the following:   Height as of this encounter: 4' 7 (1.397 m).   Weight as of this encounter: 153 lb (69.4  kg).  Last encounter: 07/03/2023 Last procedure: 08/28/2023  Reason for encounter: medication management. No change in medical history since last visit.  Patient's pain is at baseline.  Patient continues multimodal pain regimen as prescribed.  States that it provides pain relief and improvement in functional status.  Pharmacotherapy Assessment   Analgesic: Tramadol  (Ultram ) 50 mg tablet every 12 hours as needed for pain. MME=20 Butrans  patch 5 mcg/hr 1 patch once a week  Monitoring: [place] PMP: PDMP reviewed during this encounter.       Pharmacotherapy: No side-effects or adverse reactions reported. Compliance: No problems identified. Effectiveness: Clinically acceptable.  Margrette Nathanel PARAS, RN  10/15/2023  9:42 AM  Sign when Signing Visit Safety precautions to be maintained throughout the outpatient stay will include: orient to surroundings, keep bed in low position, maintain call bell within reach at all times, provide assistance with transfer out of bed and ambulation.   Nursing Pain Medication Assessment:  Safety precautions to be maintained throughout the outpatient stay will include: orient to surroundings, keep bed in low position, maintain call bell within reach at all times, provide assistance with transfer out of bed and ambulation.  Medication Inspection Compliance: Pill count conducted under aseptic conditions, in front of the patient. Neither the pills nor the bottle was removed from the patient's sight at any time. Once count was completed pills were immediately returned to the patient in their original bottle.  Medication: Buprenorphine  patch (Butrans ) Pill/Patch Count: 0 of 4 pills/patches remain Pill/Patch Appearance: Markings consistent with prescribed medication Bottle Appearance: Standard pharmacy container. Clearly labeled. Filled Date: 5 / 15 / 2025 Last Medication intake:  Yesterday    UDS:  Summary  Date Value Ref Range Status  10/15/2019 Note  Final    Comment:     ==================================================================== Compliance Drug Analysis, Ur ==================================================================== Test                             Result       Flag       Units  Drug Absent but Declared for Prescription Verification   Buprenorphine                   Not Detected UNEXPECTED ng/mg creat    Transdermal buprenorphine , as indicated in the declared medication    list, is not always detected even when used as directed.    Tramadol                        Not Detected UNEXPECTED ng/mg creat   Gabapentin                      Not Detected UNEXPECTED   Cyclobenzaprine                 Not Detected UNEXPECTED   Trazodone                       Not Detected UNEXPECTED   Promethazine                    Not Detected UNEXPECTED ==================================================================== Test                      Result    Flag   Units      Ref Range   Creatinine              91               mg/dL      >=79 ==================================================================== Declared Medications:  The flagging and interpretation on this report are based on the  following declared medications.  Unexpected results may arise from  inaccuracies in the declared medications.   **Note: The testing scope of this panel includes these medications:   Cyclobenzaprine  (Flexeril )  Gabapentin  (Neurontin )  Promethazine  (Phenergan )  Tramadol  (Ultram )  Trazodone  (Desyrel )   **Note: The testing scope of this panel does not include small to  moderate amounts of these reported medications:   Buprenorphine  Patch (BuTrans )   **Note: The testing scope of this panel does not include the  following reported medications:   Albuterol (Ventolin HFA)  Ethinyl Estradiol  (Sprintec)  Famotidine  (Pepcid )  Iron  Norgestimate  (Sprintec)  Vitamin B12  Vitamin D3 ==================================================================== For clinical  consultation, please call [phone]. ====================================================================     No results found for: CBDTHCR No results found for: D8THCCBX No results found for: D9THCCBX  ROS  Constitutional: Denies any fever or chills Gastrointestinal: No reported hemesis, hematochezia, vomiting, or acute GI distress Musculoskeletal: Lower right back pain, neck pain Neurological: No reported episodes of acute onset apraxia, aphasia, dysarthria, agnosia, amnesia, paralysis, loss of coordination, or loss of consciousness  Medication Review  Vitamin D3, albuterol, buprenorphine , cyanocobalamin , cyclobenzaprine , ergocalciferol, famotidine , ferrous sulfate , gabapentin , medroxyPROGESTERone , oxyBUTYnin, propranolol ER, traMADol , venlafaxine XR, vitamin B-12, and zolpidem  History Review  Allergy: Ms. Mareno is allergic to hypafix [wound dressings], ativan  [lorazepam ], and tizanidine. Drug: Ms. Fuston  reports no history of drug use. Alcohol:  has no history on file for alcohol use. Tobacco:  reports that she  has never smoked. She has never used smokeless tobacco. Social: Ms. Harnisch  reports that she has never smoked. She has never used smokeless tobacco. She reports that she does not use drugs. Medical:  has a past medical history of Anemia and Jarcho-Levin syndrome. Surgical: Ms. Loughmiller  has a past surgical history that includes Cervical spine surgery (07/2014); Back surgery (10/2014); and Anterior cervical decomp/discectomy fusion (N/A, 02/11/2019). Family: family history includes Hypertension in her father.  Laboratory Chemistry Profile   Renal Lab Results  Component Value Date   BUN 16 08/07/2023   CREATININE 0.75 08/07/2023   GFRAA >60 02/15/2019   GFRNONAA >60 08/07/2023    Hepatic Lab Results  Component Value Date   AST 23 08/07/2023   ALT 19 08/07/2023   ALBUMIN 3.6 08/07/2023   ALKPHOS 50 08/07/2023   LIPASE 18 02/14/2019    Electrolytes Lab  Results  Component Value Date   NA 139 08/07/2023   K 4.6 08/07/2023   CL 111 08/07/2023   CALCIUM 9.1 08/07/2023   MG 2.2 08/07/2023    Bone No results found for: VD25OH, CI874NY7UNU, CI6874NY7, CI7874NY7, 25OHVITD1, 25OHVITD2, 25OHVITD3, TESTOFREE, TESTOSTERONE  Inflammation (CRP: Acute Phase) (ESR: Chronic Phase) Lab Results  Component Value Date   LATICACIDVEN 1.6 04/19/2015         Note: Above Lab results reviewed.  Recent Imaging Review  CT Soft Tissue Neck W Contrast CLINICAL DATA:  post-op swelling/pain  EXAM: CT NECK WITH CONTRAST  TECHNIQUE: Multidetector CT imaging of the neck was performed using the standard protocol following the bolus administration of intravenous contrast.  RADIATION DOSE REDUCTION: This exam was performed according to the departmental dose-optimization program which includes automated exposure control, adjustment of the mA and/or kV according to patient size and/or use of iterative reconstruction technique.  CONTRAST:  75mL OMNIPAQUE  IOHEXOL  300 MG/ML  SOLN  COMPARISON:  None Available.  FINDINGS: Pharynx and larynx: Status post tonsillectomy with nonspecific mild edema in the surrounding oropharynx. No discrete, drainable fluid collection. No mass lesion.  Salivary glands: No inflammation, mass, or stone.  Thyroid : Normal.  Lymph nodes: Mildly prominent upper cervical chain lymph nodes, likely reactive but nonspecific.  Vascular: Unremarkable on limited assessment due to non arterial timing.  Limited intracranial: Negative.  Visualized orbits: Negative.  Mastoids and visualized paranasal sinuses: Largely clear sinuses. No mastoid effusions.  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  Upper chest: Visualized lung apices are clear.  IMPRESSION: Status post tonsillectomy with nonspecific mild edema in the surrounding oropharynx. This could be postoperative  given recent surgery, but superimposed infection is not excluded. No discrete, drainable fluid collection.  Electronically Signed   By: Gilmore GORMAN Molt M.D.   On: 08/06/2023 01:20 Note: Reviewed        Physical Exam  General appearance: Well nourished, well developed, and well hydrated. In no apparent acute distress Mental status: Alert, oriented x 3 (person, place, & time)       Respiratory: No evidence of acute respiratory distress Eyes: [name] Vitals: BP (!) 142/94   Pulse 84   Temp (!) 97.4 F (36.3 C)   Ht 4' 7 (1.397 m)   Wt 153 lb (69.4 kg)   SpO2 100%   BMI 35.56 kg/m  BMI: Estimated body mass index is 35.56 kg/m as calculated from the following:   Height as of this encounter: 4' 7 (1.397 m).   Weight as of this encounter: 153 lb (69.4 kg). Ideal: Patient must be  at least 60 in tall to calculate ideal body weight  Assessment   Diagnosis Status  1. Chronic pain syndrome   2. Myofascial pain syndrome of lumbar spine   3. Cervicalgia   4. Infantile idiopathic scoliosis of thoracolumbar region   5. History of lumbar spinal fusion (T11- iliac)   6. Chronic hip pain, bilateral   7. Chronic SI joint pain   8. Disorder of left rotator cuff   9. Chronic left hip pain   10. Medication management    Controlled Controlled Controlled   Updated Problems: Problem  Medication Management    Plan of Care  Problem-specific:  Assessment and Plan  We will continue on current medication regimen.  Prescribing drug monitoring (PDMP) reviewed; findings consistent with the use of prescribed medication and no evidence of narcotic misuse or abuse. Routine UDS ordered today.  Schedule follow-up in 90 days for medication management.     Ms. Chanel Mcadams has a current medication list which includes the following long-term medication(s): albuterol, famotidine , ferrous sulfate , gabapentin , medroxyprogesterone , propranolol er, and zolpidem.  Pharmacotherapy  (Medications Ordered): Meds ordered this encounter  Medications   traMADol  (ULTRAM ) 50 MG tablet    Sig: Take 1 tablet (50 mg total) by mouth every 12 (twelve) hours as needed for severe pain (pain score 7-10).    Dispense:  60 tablet    Refill:  3    Fill one day early if pharmacy is closed on scheduled refill date.   buprenorphine  (BUTRANS ) 5 MCG/HR PTWK    Sig: Place 1 patch onto the skin once a week.    Dispense:  4 patch    Refill:  3   Orders:  Orders Placed This Encounter  Procedures   ToxASSURE Select 13 (MW), Urine    Volume: 30 ml(s). Minimum 3 ml of urine is needed. Document temperature of fresh sample. Indications: Long term (current) use of opiate analgesic (S20.108)    Release to patient:   Immediate        Return in about 3 months (around 01/15/2024) for (F2F), (MM), Emmy Blanch NP.    Recent Visits Date Type Provider Dept  09/18/23 Procedure visit Marcelino Nurse, MD [facility]  08/28/23 Procedure visit Marcelino Nurse, MD [facility]  07/31/23 Procedure visit Marcelino Nurse, MD [facility]  Showing recent visits within past 90 days and meeting all other requirements Today's Visits Date Type Provider Dept  10/15/23 Office Visit Deijah Spikes K, NP [facility]  Showing today's visits and meeting all other requirements Future Appointments Date Type Provider Dept  10/16/23 Appointment Marcelino Nurse, MD [facility]  01/13/24 Appointment Krithika Tome K, NP [facility]  Showing future appointments within next 90 days and meeting all other requirements  I discussed the assessment and treatment plan with the patient. The patient was provided an opportunity to ask questions and all were answered. The patient agreed with the plan and demonstrated an understanding of the instructions.  Patient advised to call back or seek an in-person evaluation if the symptoms or condition worsens.  Duration of encounter: 30  minutes.  Total time on encounter, as per AMA guidelines included both the face-to-face and non-face-to-face time personally spent by the physician and/or other qualified health care professional(s) on the day of the encounter (includes time in activities that require the physician or other qualified health care professional and does not include time in activities normally performed by clinical staff). Physician's time may  include the following activities when performed: Preparing to see the patient (e.g., pre-charting review of records, searching for previously ordered imaging, lab work, and nerve conduction tests) Review of prior analgesic pharmacotherapies. Reviewing PMP Interpreting ordered tests (e.g., lab work, imaging, nerve conduction tests) Performing post-procedure evaluations, including interpretation of diagnostic procedures Obtaining and/or reviewing separately obtained history Performing a medically appropriate examination and/or evaluation Counseling and educating the patient/family/caregiver Ordering medications, tests, or procedures Referring and communicating with other health care professionals (when not separately reported) Documenting clinical information in the electronic or other health record Independently interpreting results (not separately reported) and communicating results to the patient/ family/caregiver Care coordination (not separately reported)  Note by: Rainen Vanrossum K Eugune Sine, NP (TTS and AI technology used. I apologize for any typographical errors that were not detected and corrected.) Date: 10/15/2023; Time: 10:05 AM

## 2023-10-15 NOTE — Progress Notes (Signed)
 Safety precautions to be maintained throughout the outpatient stay will include: orient to surroundings, keep bed in low position, maintain call bell within reach at all times, provide assistance with transfer out of bed and ambulation.   Nursing Pain Medication Assessment:  Safety precautions to be maintained throughout the outpatient stay will include: orient to surroundings, keep bed in low position, maintain call bell within reach at all times, provide assistance with transfer out of bed and ambulation.  Medication Inspection Compliance: Pill count conducted under aseptic conditions, in front of the patient. Neither the pills nor the bottle was removed from the patient's sight at any time. Once count was completed pills were immediately returned to the patient in their original bottle.  Medication: Buprenorphine  patch (Butrans ) Pill/Patch Count: 0 of 4 pills/patches remain Pill/Patch Appearance: Markings consistent with prescribed medication Bottle Appearance: Standard pharmacy container. Clearly labeled. Filled Date: 5 / 27 / 2025 Last Medication intake:  Yesterday

## 2023-10-16 ENCOUNTER — Ambulatory Visit
Attending: Student in an Organized Health Care Education/Training Program | Admitting: Student in an Organized Health Care Education/Training Program

## 2023-10-16 ENCOUNTER — Encounter: Payer: Self-pay | Admitting: Student in an Organized Health Care Education/Training Program

## 2023-10-16 VITALS — BP 128/83 | HR 99 | Temp 96.6°F | Resp 16 | Ht <= 58 in | Wt 153.0 lb

## 2023-10-16 DIAGNOSIS — M542 Cervicalgia: Secondary | ICD-10-CM | POA: Diagnosis not present

## 2023-10-16 DIAGNOSIS — M7918 Myalgia, other site: Secondary | ICD-10-CM | POA: Insufficient documentation

## 2023-10-16 DIAGNOSIS — G894 Chronic pain syndrome: Secondary | ICD-10-CM | POA: Insufficient documentation

## 2023-10-16 DIAGNOSIS — M4105 Infantile idiopathic scoliosis, thoracolumbar region: Secondary | ICD-10-CM | POA: Insufficient documentation

## 2023-10-16 MED ORDER — ROPIVACAINE HCL 2 MG/ML IJ SOLN: 18.0000 mL | Freq: Once | INTRAMUSCULAR | Status: DC

## 2023-10-16 MED ORDER — ROPIVACAINE HCL 2 MG/ML IJ SOLN
INTRAMUSCULAR | Status: AC
  Filled 2023-10-16: qty 20

## 2023-10-16 MED ORDER — DIAZEPAM 5 MG PO TABS
ORAL_TABLET | ORAL | Status: AC
  Filled 2023-10-16: qty 1

## 2023-10-16 NOTE — Progress Notes (Signed)
 PROVIDER NOTE: Information contained herein reflects review and annotations entered in association with encounter. Interpretation of such information and data should be left to medically-trained personnel. Information provided to patient can be located elsewhere in the medical record under Patient Instructions. Document created using STT-dictation technology, any transcriptional errors that may result from process are unintentional.    Patient: Brianna Arnold  Service Category: Procedure  Provider: Wallie Sherry, MD  DOB: [birth year]  DOS: 10/16/2023  Location: [facility]  MRN: [id number]  Setting: Ambulatory - outpatient  Referring Provider: Auston Reyes BIRCH, MD  Type: Established Patient  Specialty: Interventional Pain Management  PCP: Auston Reyes BIRCH, MD   Primary Reason for Visit: Interventional Pain Management Treatment. CC: back pain  Procedure:          Anesthesia, Analgesia, Anxiolysis:  Type: Lumbar Paraspinal Muscle Trigger Point Injection (3+)         Quadratus lumborum, erector spinae, multifidus and periscapular region and bilateral cervical/trapezius TPI CPT: 20553 Primary Purpose: Therapeutic Approach: Percutaneous, ipsilateral approach. Laterality: Midline        Type: Local Anesthesia   5 mg PO Valium  Position: Prone   Indications: 1. Chronic pain syndrome   2. Myofascial pain syndrome of lumbar spine   3. Cervicalgia   4. Infantile idiopathic scoliosis of thoracolumbar region       Pain Score: Pre-procedure: 7 /10 Post-procedure: 2 /10   Pre-op H&P Assessment:  Brianna Arnold is a 33 y.o. (year old), female patient, seen today for interventional treatment. She  has a past surgical history that includes Cervical spine surgery (07/2014); Back surgery (10/2014); and Anterior cervical decomp/discectomy fusion (N/A, 02/11/2019). Brianna Arnold has a current medication list which includes the following prescription(s): albuterol, buprenorphine ,  vitamin d3, cyanocobalamin , cyclobenzaprine , ergocalciferol, famotidine , ferrous sulfate , gabapentin , medroxyprogesterone , oxybutynin, propranolol er, tramadol , venlafaxine xr, vitamin b-12, and zolpidem, and the following Facility-Administered Medications: ropivacaine  (pf) 2 mg/ml (0.2%). Her primarily concern today is the Neck Pain and Back Pain (Lower right quadrant)   Initial Vital Signs:  Pulse/HCG Rate: 99  Temp: (!) 96.6 F (35.9 C) Resp: 16 BP: 128/83 SpO2: 99 %  BMI: Estimated body mass index is 34.92 kg/m as calculated from the following:   Height as of this encounter: 4' 7.5 (1.41 m).   Weight as of this encounter: 153 lb (69.4 kg).  Risk Assessment: Allergies: Reviewed. She is allergic to hypafix [wound dressings], ativan  [lorazepam ], and tizanidine.  Allergy Precautions: None required Coagulopathies: Reviewed. None identified.  Blood-thinner therapy: None at this time Active Infection(s): Reviewed. None identified. Brianna Arnold is afebrile  Site Confirmation: Brianna Arnold was asked to confirm the procedure and laterality before marking the site Procedure checklist: Completed Consent: Before the procedure and under the influence of no sedative(s), amnesic(s), or anxiolytics, the patient was informed of the treatment options, risks and possible complications. To fulfill our ethical and legal obligations, as recommended by the American Medical Association's Code of Ethics, I have informed the patient of my clinical impression; the nature and purpose of the treatment or procedure; the risks, benefits, and possible complications of the intervention; the alternatives, including doing nothing; the risk(s) and benefit(s) of the alternative treatment(s) or procedure(s); and the risk(s) and benefit(s) of doing nothing. The patient was provided information about the general risks and possible complications associated with the procedure. These may include, but are not limited to: failure to  achieve desired goals, infection, bleeding, organ or nerve damage, allergic reactions, paralysis, and death. In  addition, the patient was informed of those risks and complications associated to the procedure, such as failure to decrease pain; infection; bleeding; organ or nerve damage with subsequent damage to sensory, motor, and/or autonomic systems, resulting in permanent pain, numbness, and/or weakness of one or several areas of the body; allergic reactions; (i.e.: anaphylactic reaction); and/or death. Furthermore, the patient was informed of those risks and complications associated with the medications. These include, but are not limited to: allergic reactions (i.e.: anaphylactic or anaphylactoid reaction(s)); adrenal axis suppression; blood sugar elevation that in diabetics may result in ketoacidosis or comma; water retention that in patients with history of congestive heart failure may result in shortness of breath, pulmonary edema, and decompensation with resultant heart failure; weight gain; swelling or edema; medication-induced neural toxicity; particulate matter embolism and blood vessel occlusion with resultant organ, and/or nervous system infarction; and/or aseptic necrosis of one or more joints. Finally, the patient was informed that Medicine is not an exact science; therefore, there is also the possibility of unforeseen or unpredictable risks and/or possible complications that may result in a catastrophic outcome. The patient indicated having understood very clearly. We have given the patient no guarantees and we have made no promises. Enough time was given to the patient to ask questions, all of which were answered to the patient's satisfaction. Brianna Arnold has indicated that she wanted to continue with the procedure. Attestation: I, the ordering provider, attest that I have discussed with the patient the benefits, risks, side-effects, alternatives, likelihood of achieving goals, and potential  problems during recovery for the procedure that I have provided informed consent. Date  Time: 10/16/2023  2:07 PM  Pre-Procedure Preparation:  Monitoring: As per clinic protocol. Respiration, ETCO2, SpO2, BP, heart rate and rhythm monitor placed and checked for adequate function Safety Precautions: Patient was assessed for positional comfort and pressure points before starting the procedure. Time-out: I initiated and conducted the Time-out before starting the procedure, as per protocol. The patient was asked to participate by confirming the accuracy of the Time Out information. Verification of the correct person, site, and procedure were performed and confirmed by me, the nursing staff, and the patient. Time-out conducted as per Joint Commission's Universal Protocol ([email or web address].01.01.01). Time: 1450  Description of Procedure:          Area Prepped: Entire Lower Lumbosacral Region & Right cervical region DuraPrep (Iodine Povacrylex [0.7% available iodine] and Isopropyl Alcohol, 74% w/w) Safety Precautions: Aspiration looking for blood return was conducted prior to all injections. At no point did we inject any substances, as a needle was being advanced. No attempts were made at seeking any paresthesias. Safe injection practices and needle disposal techniques used. Medications properly checked for expiration dates. SDV (single dose vial) medications used. Description of the Procedure: Protocol guidelines were followed. The patient was placed in position over the fluoroscopy table. The target area was identified and the area prepped in the usual manner. Skin & deeper tissues infiltrated with local anesthetic. Appropriate amount of time allowed to pass for local anesthetics to take effect. The procedure needles were then advanced to the target area. Proper needle placement secured. Negative aspiration confirmed. Solution injected in intermittent fashion, asking for systemic symptoms every 0.5cc of injectate.  The needles were then removed and the area cleansed, making sure to leave some of the prepping solution back to take advantage of its long term bactericidal properties.  Vitals:   10/16/23 1412  BP: 128/83  Pulse: 99  Resp: 16  Temp: ROLLEN)  96.6 F (35.9 C)  SpO2: 99%  Weight: 153 lb (69.4 kg)  Height: 4' 7.5 (1.41 m)        Start Time: 1450 hrs. End Time: 1500 hrs. Materials:  Needle(s) Type: Regular needle Gauge: 27G Length: 1.5-in  Medication(s): Please see orders for medications and dosing details.   15 trigger points were also injected in the right and left cervical, right and left trapezius, & lumbar paraspinal region with dry needling performed.  Each trigger point was injected with 0.5 to 1 cc of 0.2% ropivacaine .   Post-operative Assessment:  Post-procedure Vital Signs:  Pulse/HCG Rate: 99  Temp: (!) 96.6 F (35.9 C) Resp: 16 BP: 128/83 SpO2: 99 %  EBL: None  Complications: No immediate post-treatment complications observed by team, or reported by patient.  Note: The patient tolerated the entire procedure well. A repeat set of vitals were taken after the procedure and the patient was kept under observation following institutional policy, for this type of procedure. Post-procedural neurological assessment was performed, showing return to baseline, prior to discharge. The patient was provided with post-procedure discharge instructions, including a section on how to identify potential problems. Should any problems arise concerning this procedure, the patient was given instructions to immediately contact us , at any time, without hesitation. In any case, we plan to contact the patient by telephone for a follow-up status report regarding this interventional procedure.  Comments:  No additional relevant information.  Plan of Care   Medications ordered for procedure: Meds ordered this encounter  Medications   ropivacaine  (PF) 2 mg/mL (0.2%) (NAROPIN ) injection 18 mL    Pt experiencing increased right hip and right sij pain with radiation to legs No orders of the defined types were placed in this encounter.    Medications administered: Kamala M. Beecher Browning had no medications administered during this visit.  See the medical record for exact dosing, route, and time of administration.  Follow-up plan:   No follow-ups on file.       Pharmacological management options:  Opioid Analgesics: Butrans  patch, side effects at 7.5 mcg an hour, analgesic benefit with no side effects at 5 mcg an hour, continue  Membrane stabilizer: Continue gabapentin  as prescribed  Muscle relaxant: Continue Flexeril  as prescribed  NSAID: To be determined at a later time  Other analgesic(s): To be determined at a later time    Interventional management options: Ms. Lyvers was informed that there is no guarantee that she would be a candidate for interventional therapies. The decision will be based on the results of diagnostic studies, as well as Ms. Becherer's risk profile.  Procedure(s) hx Left axillary peripheral nerve stimulation: 12/30/19 providing significant pain relief.  Removed 02/29/2020              Recent Visits Date Type Provider Dept  10/15/23 Office Visit Patel, Seema K, NP [facility]  09/18/23 Procedure visit Marcelino Nurse, MD [facility]  08/28/23 Procedure visit Marcelino Nurse, MD [facility]  07/31/23 Procedure visit Marcelino Nurse, MD [facility]  Showing recent visits within past 90 days and meeting all other requirements Today's Visits Date Type Provider Dept  10/16/23 Procedure visit Marcelino Nurse, MD [facility]  Showing today's visits and meeting all other requirements Future Appointments Date Type Provider Dept  11/06/23 Appointment Marcelino Nurse, MD [facility]  01/13/24 Appointment Patel, Seema K, NP [facility]  Showing future appointments within next 90 days and  meeting all other requirements  Disposition: Discharge home  Discharge (Date  Time): 10/16/2023; 1507 hrs.   Primary Care Physician: Auston Reyes BIRCH, MD Location: [facility] Note by: Wallie Sherry, MD Date: 10/16/2023; Time: 3:19 PM  Disclaimer:  Medicine is not an exact science. The only guarantee in medicine is that nothing is guaranteed. It is important to note that the decision to proceed with this intervention was based on the information collected from the patient. The Data and conclusions were drawn from the patient's questionnaire, the interview, and the physical examination. Because the information was provided in large part by the patient, it cannot be guaranteed that it has not been purposely or unconsciously manipulated. Every effort has been made to obtain as much relevant data as possible for this evaluation. It is important to note that the conclusions that lead to this procedure are derived in large part from the available data. Always take into account that the treatment will also be dependent on availability of resources and existing treatment guidelines, considered by other Pain Management Practitioners as being common knowledge and practice, at the time of the intervention. For Medico-Legal purposes, it is also important to point out that variation in procedural techniques and pharmacological choices are the acceptable norm. The indications, contraindications, technique, and results of the above procedure should only be interpreted and judged by a Board-Certified Interventional Pain Specialist with extensive familiarity and expertise in the same exact procedure and technique.

## 2023-10-16 NOTE — Patient Instructions (Signed)

## 2023-10-16 NOTE — Progress Notes (Signed)
 Safety precautions to be maintained throughout the outpatient stay will include: orient to surroundings, keep bed in low position, maintain call bell within reach at all times, provide assistance with transfer out of bed and ambulation.

## 2023-10-17 ENCOUNTER — Telehealth: Payer: Self-pay | Admitting: *Deleted

## 2023-10-17 NOTE — Telephone Encounter (Signed)
Post procedure call:   no  questions or concerns.  

## 2023-10-18 DIAGNOSIS — Z0000 Encounter for general adult medical examination without abnormal findings: Secondary | ICD-10-CM | POA: Diagnosis not present

## 2023-10-18 DIAGNOSIS — G8929 Other chronic pain: Secondary | ICD-10-CM | POA: Diagnosis not present

## 2023-10-18 DIAGNOSIS — Z79899 Other long term (current) drug therapy: Secondary | ICD-10-CM | POA: Diagnosis not present

## 2023-10-18 DIAGNOSIS — Z1329 Encounter for screening for other suspected endocrine disorder: Secondary | ICD-10-CM | POA: Diagnosis not present

## 2023-10-18 DIAGNOSIS — I10 Essential (primary) hypertension: Secondary | ICD-10-CM | POA: Diagnosis not present

## 2023-10-18 DIAGNOSIS — E538 Deficiency of other specified B group vitamins: Secondary | ICD-10-CM | POA: Diagnosis not present

## 2023-10-18 DIAGNOSIS — E559 Vitamin D deficiency, unspecified: Secondary | ICD-10-CM | POA: Diagnosis not present

## 2023-10-18 LAB — TOXASSURE SELECT 13 (MW), URINE

## 2023-11-06 ENCOUNTER — Encounter: Payer: Self-pay | Admitting: Student in an Organized Health Care Education/Training Program

## 2023-11-06 ENCOUNTER — Ambulatory Visit
Attending: Student in an Organized Health Care Education/Training Program | Admitting: Student in an Organized Health Care Education/Training Program

## 2023-11-06 VITALS — BP 132/84 | HR 74 | Temp 98.2°F | Resp 16 | Ht <= 58 in | Wt 153.0 lb

## 2023-11-06 DIAGNOSIS — G894 Chronic pain syndrome: Secondary | ICD-10-CM | POA: Insufficient documentation

## 2023-11-06 DIAGNOSIS — M542 Cervicalgia: Secondary | ICD-10-CM | POA: Diagnosis not present

## 2023-11-06 DIAGNOSIS — M7918 Myalgia, other site: Secondary | ICD-10-CM | POA: Diagnosis not present

## 2023-11-06 DIAGNOSIS — M4105 Infantile idiopathic scoliosis, thoracolumbar region: Secondary | ICD-10-CM | POA: Diagnosis not present

## 2023-11-06 MED ORDER — ROPIVACAINE HCL 2 MG/ML IJ SOLN
9.0000 mL | Freq: Once | INTRAMUSCULAR | Status: AC
  Administered 2023-11-06: 20 mL via PERINEURAL

## 2023-11-06 MED ORDER — ROPIVACAINE HCL 2 MG/ML IJ SOLN
INTRAMUSCULAR | Status: AC
  Filled 2023-11-06: qty 20

## 2023-11-06 MED ORDER — DIAZEPAM 5 MG PO TABS
ORAL_TABLET | ORAL | Status: AC
  Filled 2023-11-06: qty 1

## 2023-11-06 NOTE — Progress Notes (Signed)
 PROVIDER NOTE: Information contained herein reflects review and annotations entered in association with encounter. Interpretation of such information and data should be left to medically-trained personnel. Information provided to patient can be located elsewhere in the medical record under Patient Instructions. Document created using STT-dictation technology, any transcriptional errors that may result from process are unintentional.    Patient: Brianna Arnold  Service Category: Procedure  Provider: Wallie Sherry, MD  DOB: April 12, 1991  DOS: 11/06/2023  Location: [facility]  MRN: [id number]  Setting: Ambulatory - outpatient  Referring Provider: Auston Reyes BIRCH, MD  Type: Established Patient  Specialty: Interventional Pain Management  PCP: Auston Reyes BIRCH, MD   Primary Reason for Visit: Interventional Pain Management Treatment. CC: back pain  Procedure:          Anesthesia, Analgesia, Anxiolysis:  Type: Lumbar Paraspinal Muscle Trigger Point Injection (3+)         Quadratus lumborum, erector spinae, multifidus and periscapular region and bilateral cervical/trapezius TPI CPT: 20553 Primary Purpose: Therapeutic Approach: Percutaneous, ipsilateral approach. Laterality: Midline        Type: Local Anesthesia   5 mg PO Valium  Position: Prone   Indications: 1. Chronic pain syndrome   2. Myofascial pain syndrome of lumbar spine   3. Cervicalgia   4. Infantile idiopathic scoliosis of thoracolumbar region       Pain Score: Pre-procedure: 6 /10 Post-procedure: 6 /10   Pre-op H&P Assessment:  Brianna Arnold is a 33 y.o. (year old), female patient, seen today for interventional treatment. She  has a past surgical history that includes Cervical spine surgery (07/2014); Back surgery (10/2014); and Anterior cervical decomp/discectomy fusion (N/A, 02/11/2019). Brianna Arnold has a current medication list which includes the following prescription(s): albuterol, buprenorphine ,  vitamin d3, cyanocobalamin , cyclobenzaprine , ergocalciferol, famotidine , ferrous sulfate , gabapentin , medroxyprogesterone , oxybutynin, propranolol er, tramadol , venlafaxine xr, vitamin b-12, and zolpidem. Her primarily concern today is the Back Pain (lower) and Neck Pain (left)   Initial Vital Signs:  Pulse/HCG Rate: 81  Temp: 98.2 F (36.8 C) Resp: 16 BP: (!) 122/90 SpO2: 100 %  BMI: Estimated body mass index is 35.56 kg/m as calculated from the following:   Height as of this encounter: 4' 7 (1.397 m).   Weight as of this encounter: 153 lb (69.4 kg).  Risk Assessment: Allergies: Reviewed. She is allergic to hypafix [wound dressings], ativan  [lorazepam ], and tizanidine.  Allergy Precautions: None required Coagulopathies: Reviewed. None identified.  Blood-thinner therapy: None at this time Active Infection(s): Reviewed. None identified. Brianna Arnold is afebrile  Site Confirmation: Brianna Arnold was asked to confirm the procedure and laterality before marking the site Procedure checklist: Completed Consent: Before the procedure and under the influence of no sedative(s), amnesic(s), or anxiolytics, the patient was informed of the treatment options, risks and possible complications. To fulfill our ethical and legal obligations, as recommended by the American Medical Association's Code of Ethics, I have informed the patient of my clinical impression; the nature and purpose of the treatment or procedure; the risks, benefits, and possible complications of the intervention; the alternatives, including doing nothing; the risk(s) and benefit(s) of the alternative treatment(s) or procedure(s); and the risk(s) and benefit(s) of doing nothing. The patient was provided information about the general risks and possible complications associated with the procedure. These may include, but are not limited to: failure to achieve desired goals, infection, bleeding, organ or nerve damage, allergic reactions,  paralysis, and death. In addition, the patient was informed of those risks and complications associated  to the procedure, such as failure to decrease pain; infection; bleeding; organ or nerve damage with subsequent damage to sensory, motor, and/or autonomic systems, resulting in permanent pain, numbness, and/or weakness of one or several areas of the body; allergic reactions; (i.e.: anaphylactic reaction); and/or death. Furthermore, the patient was informed of those risks and complications associated with the medications. These include, but are not limited to: allergic reactions (i.e.: anaphylactic or anaphylactoid reaction(s)); adrenal axis suppression; blood sugar elevation that in diabetics may result in ketoacidosis or comma; water retention that in patients with history of congestive heart failure may result in shortness of breath, pulmonary edema, and decompensation with resultant heart failure; weight gain; swelling or edema; medication-induced neural toxicity; particulate matter embolism and blood vessel occlusion with resultant organ, and/or nervous system infarction; and/or aseptic necrosis of one or more joints. Finally, the patient was informed that Medicine is not an exact science; therefore, there is also the possibility of unforeseen or unpredictable risks and/or possible complications that may result in a catastrophic outcome. The patient indicated having understood very clearly. We have given the patient no guarantees and we have made no promises. Enough time was given to the patient to ask questions, all of which were answered to the patient's satisfaction. Brianna Arnold has indicated that she wanted to continue with the procedure. Attestation: I, the ordering provider, attest that I have discussed with the patient the benefits, risks, side-effects, alternatives, likelihood of achieving goals, and potential problems during recovery for the procedure that I have provided informed consent. Date   Time: 11/06/2023  2:14 PM  Pre-Procedure Preparation:  Monitoring: As per clinic protocol. Respiration, ETCO2, SpO2, BP, heart rate and rhythm monitor placed and checked for adequate function Safety Precautions: Patient was assessed for positional comfort and pressure points before starting the procedure. Time-out: I initiated and conducted the Time-out before starting the procedure, as per protocol. The patient was asked to participate by confirming the accuracy of the Time Out information. Verification of the correct person, site, and procedure were performed and confirmed by me, the nursing staff, and the patient. Time-out conducted as per Joint Commission's Universal Protocol (UP.01.01.01). Time: 1524  Description of Procedure:          Area Prepped: Entire Lower Lumbosacral Region & Right cervical region DuraPrep (Iodine Povacrylex [0.7% available iodine] and Isopropyl Alcohol, 74% w/w) Safety Precautions: Aspiration looking for blood return was conducted prior to all injections. At no point did we inject any substances, as a needle was being advanced. No attempts were made at seeking any paresthesias. Safe injection practices and needle disposal techniques used. Medications properly checked for expiration dates. SDV (single dose vial) medications used. Description of the Procedure: Protocol guidelines were followed. The patient was placed in position over the fluoroscopy table. The target area was identified and the area prepped in the usual manner. Skin & deeper tissues infiltrated with local anesthetic. Appropriate amount of time allowed to pass for local anesthetics to take effect. The procedure needles were then advanced to the target area. Proper needle placement secured. Negative aspiration confirmed. Solution injected in intermittent fashion, asking for systemic symptoms every 0.5cc of injectate. The needles were then removed and the area cleansed, making sure to leave some of the  prepping solution back to take advantage of its long term bactericidal properties.  Vitals:   11/06/23 1419 11/06/23 1524 11/06/23 1529 11/06/23 1532  BP: (!) 122/90 (!) 130/97 (!) 141/96 132/84  Pulse: 81 94 81 74  Resp:  16 18 17 16   Temp: 98.2 F (36.8 C)     SpO2: 100% 100% 100% 100%  Weight: 153 lb (69.4 kg)     Height: 4' 7 (1.397 m)           Start Time: 1524 hrs. End Time: 1332 hrs. Materials:  Needle(s) Type: Regular needle Gauge: 27G Length: 1.5-in  Medication(s): Please see orders for medications and dosing details.   15 trigger points were also injected in the right and left cervical, right and left trapezius, & lumbar paraspinal region with dry needling performed.  Each trigger point was injected with 0.5 to 1 cc of 0.2% ropivacaine .   Post-operative Assessment:  Post-procedure Vital Signs:  Pulse/HCG Rate: 74  Temp: 98.2 F (36.8 C) Resp: 16 BP: 132/84 SpO2: 100 %  EBL: None  Complications: No immediate post-treatment complications observed by team, or reported by patient.  Note: The patient tolerated the entire procedure well. A repeat set of vitals were taken after the procedure and the patient was kept under observation following institutional policy, for this type of procedure. Post-procedural neurological assessment was performed, showing return to baseline, prior to discharge. The patient was provided with post-procedure discharge instructions, including a section on how to identify potential problems. Should any problems arise concerning this procedure, the patient was given instructions to immediately contact us , at any time, without hesitation. In any case, we plan to contact the patient by telephone for a follow-up status report regarding this interventional procedure.  Comments:  No additional relevant information.  Plan of Care   Medications ordered for procedure: Meds ordered this encounter  Medications   ropivacaine  (PF) 2 mg/mL (0.2%)  (NAROPIN ) injection 9 mL   Pt experiencing increased right hip and right sij pain with radiation to legs No orders of the defined types were placed in this encounter.    Medications administered: We administered ropivacaine  (PF) 2 mg/mL (0.2%).  See the medical record for exact dosing, route, and time of administration.  Follow-up plan:   No follow-ups on file.       Pharmacological management options:  Opioid Analgesics: Butrans  patch, side effects at 7.5 mcg an hour, analgesic benefit with no side effects at 5 mcg an hour, continue  Membrane stabilizer: Continue gabapentin  as prescribed  Muscle relaxant: Continue Flexeril  as prescribed  NSAID: To be determined at a later time  Other analgesic(s): To be determined at a later time    Interventional management options: Ms. Koval was informed that there is no guarantee that she would be a candidate for interventional therapies. The decision will be based on the results of diagnostic studies, as well as Ms. Charles's risk profile.  Procedure(s) hx Left axillary peripheral nerve stimulation: 12/30/19 providing significant pain relief.  Removed 02/29/2020              Recent Visits Date Type Provider Dept  10/16/23 Procedure visit Marcelino Nurse, MD [facility]  10/15/23 Office Visit Patel, Seema K, NP [facility]  09/18/23 Procedure visit Marcelino Nurse, MD [facility]  08/28/23 Procedure visit Marcelino Nurse, MD [facility]  Showing recent visits within past 90 days and meeting all other requirements Today's Visits Date Type Provider Dept  11/06/23 Procedure visit Marcelino Nurse, MD [facility]  Showing today's visits and meeting all other requirements Future Appointments Date Type Provider Dept  12/11/23 Appointment Marcelino Nurse, MD [facility]  01/13/24 Appointment Patel, Seema K, NP [facility]  Showing future appointments within next 90 days and meeting all  other requirements  Disposition: Discharge home  Discharge (Date  Time): 11/06/2023; 1340 hrs.   Primary Care Physician: Auston Reyes BIRCH, MD Location: [facility] Note by: Wallie Sherry, MD Date: 11/06/2023; Time: 3:39 PM  Disclaimer:  Medicine is not an exact science. The only guarantee in medicine is that nothing is guaranteed. It is important to note that the decision to proceed with this intervention was based on the information collected from the patient. The Data and conclusions were drawn from the patient's questionnaire, the interview, and the physical examination. Because the information was provided in large part by the patient, it cannot be guaranteed that it has not been purposely or unconsciously manipulated. Every effort has been made to obtain as much relevant data as possible for this evaluation. It is important to note that the conclusions that lead to this procedure are derived in large part from the available data. Always take into account that the treatment will also be dependent on availability of resources and existing treatment guidelines, considered by other Pain Management Practitioners as being common knowledge and practice, at the time of the intervention. For Medico-Legal purposes, it is also important to point out that variation in procedural techniques and pharmacological choices are the acceptable norm. The indications, contraindications, technique, and results of the above procedure should only be interpreted and judged by a Board-Certified Interventional Pain Specialist with extensive familiarity and expertise in the same exact procedure and technique.

## 2023-11-06 NOTE — Patient Instructions (Signed)

## 2023-11-06 NOTE — Progress Notes (Signed)
 Safety precautions to be maintained throughout the outpatient stay will include: orient to surroundings, keep bed in low position, maintain call bell within reach at all times, provide assistance with transfer out of bed and ambulation.

## 2023-11-07 ENCOUNTER — Telehealth: Payer: Self-pay

## 2023-11-07 NOTE — Telephone Encounter (Signed)
 Post procedure follow up.  LM

## 2023-11-26 DIAGNOSIS — Z3042 Encounter for surveillance of injectable contraceptive: Secondary | ICD-10-CM | POA: Diagnosis not present

## 2023-12-11 ENCOUNTER — Encounter: Payer: Self-pay | Admitting: Student in an Organized Health Care Education/Training Program

## 2023-12-11 ENCOUNTER — Ambulatory Visit
Attending: Student in an Organized Health Care Education/Training Program | Admitting: Student in an Organized Health Care Education/Training Program

## 2023-12-11 VITALS — BP 136/105 | HR 81 | Temp 98.1°F | Resp 18 | Ht <= 58 in | Wt 163.0 lb

## 2023-12-11 DIAGNOSIS — G894 Chronic pain syndrome: Secondary | ICD-10-CM | POA: Insufficient documentation

## 2023-12-11 DIAGNOSIS — M542 Cervicalgia: Secondary | ICD-10-CM | POA: Diagnosis not present

## 2023-12-11 DIAGNOSIS — M4105 Infantile idiopathic scoliosis, thoracolumbar region: Secondary | ICD-10-CM | POA: Insufficient documentation

## 2023-12-11 DIAGNOSIS — M7918 Myalgia, other site: Secondary | ICD-10-CM | POA: Insufficient documentation

## 2023-12-11 MED ORDER — DIAZEPAM 5 MG/ML PO CONC
5.0000 mg | ORAL | Status: DC
  Filled 2023-12-11: qty 1

## 2023-12-11 MED ORDER — ROPIVACAINE HCL 2 MG/ML IJ SOLN
18.0000 mL | Freq: Once | INTRAMUSCULAR | Status: AC
  Administered 2023-12-11: 18 mL via PERINEURAL

## 2023-12-11 MED ORDER — DIAZEPAM 5 MG PO TABS
5.0000 mg | ORAL_TABLET | ORAL | Status: AC
  Administered 2023-12-11: 5 mg via ORAL

## 2023-12-11 MED ORDER — ROPIVACAINE HCL 2 MG/ML IJ SOLN
INTRAMUSCULAR | Status: AC
Start: 2023-12-11 — End: 2023-12-11
  Filled 2023-12-11: qty 20

## 2023-12-11 MED ORDER — DIAZEPAM 5 MG PO TABS
ORAL_TABLET | ORAL | Status: AC
  Filled 2023-12-11: qty 1

## 2023-12-11 NOTE — Patient Instructions (Signed)

## 2023-12-11 NOTE — Progress Notes (Signed)
 PROVIDER NOTE: Information contained herein reflects review and annotations entered in association with encounter. Interpretation of such information and data should be left to medically-trained personnel. Information provided to patient can be located elsewhere in the medical record under Patient Instructions. Document created using STT-dictation technology, any transcriptional errors that may result from process are unintentional.    Patient: Brianna Arnold  Service Category: Procedure  Provider: Wallie Sherry, MD  DOB: 05-03-1990  DOS: 12/11/2023  Location: [facility]  MRN: [id number]  Setting: Ambulatory - outpatient  Referring Provider: Auston Reyes BIRCH, MD  Type: Established Patient  Specialty: Interventional Pain Management  PCP: Auston Reyes BIRCH, MD   Primary Reason for Visit: Interventional Pain Management Treatment. CC: back pain  Procedure:          Anesthesia, Analgesia, Anxiolysis:  Type: Lumbar Paraspinal Muscle Trigger Point Injection (3+)         Quadratus lumborum, erector spinae, multifidus and periscapular region and bilateral cervical/trapezius TPI CPT: 20553 Primary Purpose: Therapeutic Approach: Percutaneous, ipsilateral approach. Laterality: Midline        Type: Local Anesthesia   5 mg PO Valium  Position: Prone   Indications: 1. Chronic pain syndrome   2. Myofascial pain syndrome of lumbar spine   3. Cervicalgia   4. Infantile idiopathic scoliosis of thoracolumbar region       Pain Score: Pre-procedure: 8 /10 Post-procedure: 8 /10   Pre-op H&P Assessment:  Brianna Arnold is a 33 y.o. (year old), female patient, seen today for interventional treatment. She  has a past surgical history that includes Cervical spine surgery (07/2014); Back surgery (10/2014); and Anterior cervical decomp/discectomy fusion (N/A, 02/11/2019). Brianna Arnold has a current medication list which includes the following prescription(s): albuterol, buprenorphine ,  vitamin d3, cyanocobalamin , cyclobenzaprine , ergocalciferol, famotidine , ferrous sulfate , gabapentin , medroxyprogesterone , oxybutynin, propranolol er, tramadol , venlafaxine xr, vitamin b-12, and zolpidem, and the following Facility-Administered Medications: diazepam . Her primarily concern today is the Back Pain   Initial Vital Signs:  Pulse/HCG Rate: 81ECG Heart Rate: 75 Temp: 98.1 F (36.7 C) Resp: 16 BP: 127/83 SpO2: 100 %  BMI: Estimated body mass index is 37.88 kg/m as calculated from the following:   Height as of this encounter: 4' 7 (1.397 m).   Weight as of this encounter: 163 lb (73.9 kg).  Risk Assessment: Allergies: Reviewed. She is allergic to hypafix [wound dressings], ativan  [lorazepam ], and tizanidine.  Allergy Precautions: None required Coagulopathies: Reviewed. None identified.  Blood-thinner therapy: None at this time Active Infection(s): Reviewed. None identified. [name] is afebrile  Site Confirmation: Brianna Arnold was asked to confirm the procedure and laterality before marking the site Procedure checklist: Completed Consent: Before the procedure and under the influence of no sedative(s), amnesic(s), or anxiolytics, the patient was informed of the treatment options, risks and possible complications. To fulfill our ethical and legal obligations, as recommended by the American Medical Association's Code of Ethics, I have informed the patient of my clinical impression; the nature and purpose of the treatment or procedure; the risks, benefits, and possible complications of the intervention; the alternatives, including doing nothing; the risk(s) and benefit(s) of the alternative treatment(s) or procedure(s); and the risk(s) and benefit(s) of doing nothing. The patient was provided information about the general risks and possible complications associated with the procedure. These may include, but are not limited to: failure to achieve desired goals, infection, bleeding,  organ or nerve damage, allergic reactions, paralysis, and death. In addition, the patient was informed of those risks and  complications associated to the procedure, such as failure to decrease pain; infection; bleeding; organ or nerve damage with subsequent damage to sensory, motor, and/or autonomic systems, resulting in permanent pain, numbness, and/or weakness of one or several areas of the body; allergic reactions; (i.e.: anaphylactic reaction); and/or death. Furthermore, the patient was informed of those risks and complications associated with the medications. These include, but are not limited to: allergic reactions (i.e.: anaphylactic or anaphylactoid reaction(s)); adrenal axis suppression; blood sugar elevation that in diabetics may result in ketoacidosis or comma; water retention that in patients with history of congestive heart failure may result in shortness of breath, pulmonary edema, and decompensation with resultant heart failure; weight gain; swelling or edema; medication-induced neural toxicity; particulate matter embolism and blood vessel occlusion with resultant organ, and/or nervous system infarction; and/or aseptic necrosis of one or more joints. Finally, the patient was informed that Medicine is not an exact science; therefore, there is also the possibility of unforeseen or unpredictable risks and/or possible complications that may result in a catastrophic outcome. The patient indicated having understood very clearly. We have given the patient no guarantees and we have made no promises. Enough time was given to the patient to ask questions, all of which were answered to the patient's satisfaction. Brianna Arnold has indicated that she wanted to continue with the procedure. Attestation: I, the ordering provider, attest that I have discussed with the patient the benefits, risks, side-effects, alternatives, likelihood of achieving goals, and potential problems during recovery for the procedure that  I have provided informed consent. Date  Time: 12/11/2023  1:51 PM  Pre-Procedure Preparation:  Monitoring: As per clinic protocol. Respiration, ETCO2, SpO2, BP, heart rate and rhythm monitor placed and checked for adequate function Safety Precautions: Patient was assessed for positional comfort and pressure points before starting the procedure. Time-out: I initiated and conducted the Time-out before starting the procedure, as per protocol. The patient was asked to participate by confirming the accuracy of the Time Out information. Verification of the correct person, site, and procedure were performed and confirmed by me, the nursing staff, and the patient. Time-out conducted as per Joint Commission's Universal Protocol ([email or web address].01.01.01). Time: 1500  Description of Procedure:          Area Prepped: Entire Lower Lumbosacral Region & Right cervical region DuraPrep (Iodine Povacrylex [0.7% available iodine] and Isopropyl Alcohol, 74% w/w) Safety Precautions: Aspiration looking for blood return was conducted prior to all injections. At no point did we inject any substances, as a needle was being advanced. No attempts were made at seeking any paresthesias. Safe injection practices and needle disposal techniques used. Medications properly checked for expiration dates. SDV (single dose vial) medications used. Description of the Procedure: Protocol guidelines were followed. The patient was placed in position over the fluoroscopy table. The target area was identified and the area prepped in the usual manner. Skin & deeper tissues infiltrated with local anesthetic. Appropriate amount of time allowed to pass for local anesthetics to take effect. The procedure needles were then advanced to the target area. Proper needle placement secured. Negative aspiration confirmed. Solution injected in intermittent fashion, asking for systemic symptoms every 0.5cc of injectate. The needles were then removed and the area  cleansed, making sure to leave some of the prepping solution back to take advantage of its long term bactericidal properties.  Vitals:   12/11/23 1408 12/11/23 1500 12/11/23 1505  BP: 127/83 136/86 (!) 136/105  Pulse: 81    Resp: 16 18 18  Temp: 98.1 F (36.7 C)    TempSrc: Temporal    SpO2: 100% 100% 97%  Weight: 163 lb (73.9 kg)    Height: 4' 7 (1.397 m)          Start Time: 1500 hrs. End Time:   hrs. Materials:  Needle(s) Type: Regular needle Gauge: 27G Length: 1.5-in  Medication(s): Please see orders for medications and dosing details.   13 trigger points were also injected in the right and left cervical, right and left trapezius, & lumbar paraspinal region with dry needling performed.  Each trigger point was injected with 0.5 to 1 cc of 0.2% ropivacaine .   Post-operative Assessment:  Post-procedure Vital Signs:  Pulse/HCG Rate: 8170 Temp: 98.1 F (36.7 C) Resp: 18 BP: (!) 136/105 SpO2: 97 %  EBL: None  Complications: No immediate post-treatment complications observed by team, or reported by patient.  Note: The patient tolerated the entire procedure well. A repeat set of vitals were taken after the procedure and the patient was kept under observation following institutional policy, for this type of procedure. Post-procedural neurological assessment was performed, showing return to baseline, prior to discharge. The patient was provided with post-procedure discharge instructions, including a section on how to identify potential problems. Should any problems arise concerning this procedure, the patient was given instructions to immediately contact us , at any time, without hesitation. In any case, we plan to contact the patient by telephone for a follow-up status report regarding this interventional procedure.  Comments:  No additional relevant information.  Plan of Care   Medications ordered for procedure: Meds ordered this encounter  Medications   ropivacaine   (PF) 2 mg/mL (0.2%) (NAROPIN ) injection 18 mL   diazepam  (VALIUM ) tablet 5 mg    Make sure Flumazenil is available in the pyxis when using this medication. If oversedation occurs, administer 0.2 mg IV over 15 sec. If after 45 sec no response, administer 0.2 mg again over 1 min; may repeat at 1 min intervals; not to exceed 4 doses (1 mg)   diazepam  (VALIUM ) 5 MG/ML concentrated oral solution 5 mg   Pt experiencing increased right hip and right sij pain with radiation to legs No orders of the defined types were placed in this encounter.    Medications administered: We administered ropivacaine  (PF) 2 mg/mL (0.2%) and diazepam .  See the medical record for exact dosing, route, and time of administration.  Follow-up plan:   No follow-ups on file.       Pharmacological management options:  Opioid Analgesics: Butrans  patch, side effects at 7.5 mcg an hour, analgesic benefit with no side effects at 5 mcg an hour, continue  Membrane stabilizer: Continue gabapentin  as prescribed  Muscle relaxant: Continue Flexeril  as prescribed  NSAID: To be determined at a later time  Other analgesic(s): To be determined at a later time    Interventional management options: Ms. Schnelle was informed that there is no guarantee that she would be a candidate for interventional therapies. The decision will be based on the results of diagnostic studies, as well as Ms. Mccleery's risk profile.  Procedure(s) hx Left axillary peripheral nerve stimulation: 12/30/19 providing significant pain relief.  Removed 02/29/2020              Recent Visits Date Type Provider Dept  11/06/23 Procedure visit Marcelino Nurse, MD [facility]  10/16/23 Procedure visit Marcelino Nurse, MD [facility]  10/15/23 Office Visit Patel, Seema K, NP [facility]  09/18/23 Procedure visit Marcelino Nurse,  MD [facility]  Showing recent visits within past 90 days and meeting all other requirements Today's  Visits Date Type Provider Dept  12/11/23 Procedure visit Marcelino Nurse, MD [facility]  Showing today's visits and meeting all other requirements Future Appointments Date Type Provider Dept  01/08/24 Appointment Marcelino Nurse, MD [facility]  01/13/24 Appointment Patel, Seema K, NP [facility]  Showing future appointments within next 90 days and meeting all other requirements  Disposition: Discharge home  Discharge (Date  Time): 12/11/2023; 1510 hrs.   Primary Care Physician: Auston Reyes BIRCH, MD Location: [facility] Note by: Nurse Marcelino, MD Date: 12/11/2023; Time: 3:40 PM  Disclaimer:  Medicine is not an exact science. The only guarantee in medicine is that nothing is guaranteed. It is important to note that the decision to proceed with this intervention was based on the information collected from the patient. The Data and conclusions were drawn from the patient's questionnaire, the interview, and the physical examination. Because the information was provided in large part by the patient, it cannot be guaranteed that it has not been purposely or unconsciously manipulated. Every effort has been made to obtain as much relevant data as possible for this evaluation. It is important to note that the conclusions that lead to this procedure are derived in large part from the available data. Always take into account that the treatment will also be dependent on availability of resources and existing treatment guidelines, considered by other Pain Management Practitioners as being common knowledge and practice, at the time of the intervention. For Medico-Legal purposes, it is also important to point out that variation in procedural techniques and pharmacological choices are the acceptable norm. The indications, contraindications, technique, and results of the above procedure should only be interpreted and judged by a Board-Certified Interventional  Pain Specialist with extensive familiarity and expertise in the same exact procedure and technique.

## 2023-12-12 ENCOUNTER — Telehealth: Payer: Self-pay | Admitting: *Deleted

## 2023-12-12 NOTE — Telephone Encounter (Signed)
 Called pt for post procedure follow-up . Message left.

## 2024-01-08 ENCOUNTER — Encounter: Payer: Self-pay | Admitting: Student in an Organized Health Care Education/Training Program

## 2024-01-08 ENCOUNTER — Ambulatory Visit
Attending: Student in an Organized Health Care Education/Training Program | Admitting: Student in an Organized Health Care Education/Training Program

## 2024-01-08 VITALS — BP 157/99 | HR 82 | Temp 97.9°F | Resp 15 | Ht <= 58 in | Wt 150.0 lb

## 2024-01-08 DIAGNOSIS — Z981 Arthrodesis status: Secondary | ICD-10-CM | POA: Diagnosis not present

## 2024-01-08 DIAGNOSIS — M4105 Infantile idiopathic scoliosis, thoracolumbar region: Secondary | ICD-10-CM | POA: Insufficient documentation

## 2024-01-08 DIAGNOSIS — G894 Chronic pain syndrome: Secondary | ICD-10-CM | POA: Insufficient documentation

## 2024-01-08 DIAGNOSIS — M7918 Myalgia, other site: Secondary | ICD-10-CM | POA: Diagnosis not present

## 2024-01-08 MED ORDER — ROPIVACAINE HCL 2 MG/ML IJ SOLN
18.0000 mL | Freq: Once | INTRAMUSCULAR | Status: AC
  Administered 2024-01-08: 18 mL via PERINEURAL
  Filled 2024-01-08: qty 20

## 2024-01-08 MED ORDER — DIAZEPAM 5 MG PO TABS
5.0000 mg | ORAL_TABLET | ORAL | Status: AC
  Administered 2024-01-08: 5 mg via ORAL

## 2024-01-08 MED ORDER — DIAZEPAM 5 MG PO TABS
ORAL_TABLET | ORAL | Status: AC
  Filled 2024-01-08: qty 1

## 2024-01-08 NOTE — Patient Instructions (Addendum)
 PLEASE RESEARCH CLUNEAL NEURALGIA. ______________________________________________________________________    Post-Procedure Discharge Instructions  Instructions: Apply ice:  Purpose: This will minimize any swelling and discomfort after procedure.  When: Day of procedure, as soon as you get home. How: Fill a plastic sandwich bag with crushed ice. Cover it with a small towel and apply to injection site. How long: (15 min on, 15 min off) Apply for 15 minutes then remove x 15 minutes.  Repeat sequence on day of procedure, until you go to bed. Apply heat:  Purpose: To treat any soreness and discomfort from the procedure. When: Starting the next day after the procedure. How: Apply heat to procedure site starting the day following the procedure. How long: May continue to repeat daily, until discomfort goes away. Food intake: Start with clear liquids (like water) and advance to regular food, as tolerated.  Physical activities: Keep activities to a minimum for the first 8 hours after the procedure. After that, then as tolerated. Driving: If you have received any sedation, be responsible and do not drive. You are not allowed to drive for 24 hours after having sedation. Blood thinner: (Applies only to those taking blood thinners) You may restart your blood thinner 6 hours after your procedure. Insulin: (Applies only to Diabetic patients taking insulin) As soon as you can eat, you may resume your normal dosing schedule. Infection prevention: Keep procedure site clean and dry. Shower daily and clean area with soap and water. Post-procedure Pain Diary: Extremely important that this be done correctly and accurately. Recorded information will be used to determine the next step in treatment. For the purpose of accuracy, follow these rules: Evaluate only the area treated. Do not report or include pain from an untreated area. For the purpose of this evaluation, ignore all other areas of pain, except for the  treated area. After your procedure, avoid taking a long nap and attempting to complete the pain diary after you wake up. Instead, set your alarm clock to go off every hour, on the hour, for the initial 8 hours after the procedure. Document the duration of the numbing medicine, and the relief you are getting from it. Do not go to sleep and attempt to complete it later. It will not be accurate. If you received sedation, it is likely that you were given a medication that may cause amnesia. Because of this, completing the diary at a later time may cause the information to be inaccurate. This information is needed to plan your care. Follow-up appointment: Keep your post-procedure follow-up evaluation appointment after the procedure (usually 2 weeks for most procedures, 6 weeks for radiofrequencies). DO NOT FORGET to bring you pain diary with you.   Expect: (What should I expect to see with my procedure?) From numbing medicine (AKA: Local Anesthetics): Numbness or decrease in pain. You may also experience some weakness, which if present, could last for the duration of the local anesthetic. Onset: Full effect within 15 minutes of injected. Duration: It will depend on the type of local anesthetic used. On the average, 1 to 8 hours.  From steroids (Applies only if steroids were used): Decrease in swelling or inflammation. Once inflammation is improved, relief of the pain will follow. Onset of benefits: Depends on the amount of swelling present. The more swelling, the longer it will take for the benefits to be seen. In some cases, up to 10 days. Duration: Steroids will stay in the system x 2 weeks. Duration of benefits will depend on multiple posibilities including persistent  irritating factors. Side-effects: If present, they may typically last 2 weeks (the duration of the steroids). Frequent: Cramps (if they occur, drink Gatorade and take over-the-counter Magnesium  450-500 mg once to twice a day); water  retention with temporary weight gain; increases in blood sugar; decreased immune system response; increased appetite. Occasional: Facial flushing (red, warm cheeks); mood swings; menstrual changes. Uncommon: Long-term decrease or suppression of natural hormones; bone thinning. (These are more common with higher doses or more frequent use. This is why we prefer that our patients avoid having any injection therapies in other practices.)  Very Rare: Severe mood changes; psychosis; aseptic necrosis. From procedure: Some discomfort is to be expected once the numbing medicine wears off. This should be minimal if ice and heat are applied as instructed.  Call if: (When should I call?) You experience numbness and weakness that gets worse with time, as opposed to wearing off. New onset bowel or bladder incontinence. (Applies only to procedures done in the spine)  Emergency Numbers: Durning business hours (Monday - Thursday, 8:00 AM - 4:00 PM) (Friday, 9:00 AM - 12:00 Noon): [phone] After hours: [phone] NOTE: If you are having a problem and are unable connect with, or to talk to a provider, then go to your nearest urgent care or emergency department. If the problem is serious and urgent, please call 911. ______________________________________________________________________

## 2024-01-08 NOTE — Progress Notes (Signed)
 Safety precautions to be maintained throughout the outpatient stay will include: orient to surroundings, keep bed in low position, maintain call bell within reach at all times, provide assistance with transfer out of bed and ambulation.

## 2024-01-08 NOTE — Progress Notes (Signed)
 PROVIDER NOTE: Information contained herein reflects review and annotations entered in association with encounter. Interpretation of such information and data should be left to medically-trained personnel. Information provided to patient can be located elsewhere in the medical record under Patient Instructions. Document created using STT-dictation technology, any transcriptional errors that may result from process are unintentional.    Patient: Brianna Arnold  Service Category: Procedure  Provider: Wallie Sherry, MD  DOB: 04/07/1991  DOS: 01/08/2024  Location: [facility]  MRN: [id number]  Setting: Ambulatory - outpatient  Referring Provider: Auston Reyes BIRCH, MD  Type: Established Patient  Specialty: Interventional Pain Management  PCP: Auston Reyes BIRCH, MD   Primary Reason for Visit: Interventional Pain Management Treatment. CC: back pain  Procedure:          Anesthesia, Analgesia, Anxiolysis:  Type: Lumbar Paraspinal Muscle Trigger Point Injection (3+)         Quadratus lumborum, erector spinae, multifidus and periscapular region and bilateral cervical/trapezius TPI CPT: 20553 Primary Purpose: Therapeutic Approach: Percutaneous, ipsilateral approach. Laterality: Midline        Type: Local Anesthesia   5 mg PO Valium  Position: Prone   Indications: 1. Chronic pain syndrome   2. Myofascial pain syndrome of lumbar spine   3. Infantile idiopathic scoliosis of thoracolumbar region   4. History of lumbar spinal fusion (T11- iliac)       Pain Score: Pre-procedure: 8 /10 Post-procedure: 8 /10   Pre-op H&P Assessment:  Brianna Arnold is a 33 y.o. (year old), female patient, seen today for interventional treatment. She  has a past surgical history that includes Cervical spine surgery (07/2014); Back surgery (10/2014); and Anterior cervical decomp/discectomy fusion (N/A, 02/11/2019). Brianna Arnold has a current medication list which includes the following  prescription(s): albuterol, buprenorphine , vitamin d3, cyanocobalamin , cyclobenzaprine , ergocalciferol, famotidine , ferrous sulfate , gabapentin , medroxyprogesterone , oxybutynin, propranolol er, tramadol , venlafaxine xr, vitamin b-12, and zolpidem. Her primarily concern today is the Saccral Pain   Initial Vital Signs:  Pulse/HCG Rate: 89  Temp: 97.9 F (36.6 C) Resp: 18 BP: (!) 140/97 SpO2: 100 %  BMI: Estimated body mass index is 34.24 kg/m as calculated from the following:   Height as of this encounter: 4' 7.5 (1.41 m).   Weight as of this encounter: 150 lb (68 kg).  Risk Assessment: Allergies: Reviewed. She is allergic to hypafix [wound dressings], ativan  [lorazepam ], and tizanidine.  Allergy Precautions: None required Coagulopathies: Reviewed. None identified.  Blood-thinner therapy: None at this time Active Infection(s): Reviewed. None identified. Brianna Arnold is afebrile  Site Confirmation: Brianna Arnold was asked to confirm the procedure and laterality before marking the site Procedure checklist: Completed Consent: Before the procedure and under the influence of no sedative(s), amnesic(s), or anxiolytics, the patient was informed of the treatment options, risks and possible complications. To fulfill our ethical and legal obligations, as recommended by the American Medical Association's Code of Ethics, I have informed the patient of my clinical impression; the nature and purpose of the treatment or procedure; the risks, benefits, and possible complications of the intervention; the alternatives, including doing nothing; the risk(s) and benefit(s) of the alternative treatment(s) or procedure(s); and the risk(s) and benefit(s) of doing nothing. The patient was provided information about the general risks and possible complications associated with the procedure. These may include, but are not limited to: failure to achieve desired goals, infection, bleeding, organ or nerve damage, allergic  reactions, paralysis, and death. In addition, the patient was informed of those risks and complications  associated to the procedure, such as failure to decrease pain; infection; bleeding; organ or nerve damage with subsequent damage to sensory, motor, and/or autonomic systems, resulting in permanent pain, numbness, and/or weakness of one or several areas of the body; allergic reactions; (i.e.: anaphylactic reaction); and/or death. Furthermore, the patient was informed of those risks and complications associated with the medications. These include, but are not limited to: allergic reactions (i.e.: anaphylactic or anaphylactoid reaction(s)); adrenal axis suppression; blood sugar elevation that in diabetics may result in ketoacidosis or comma; water retention that in patients with history of congestive heart failure may result in shortness of breath, pulmonary edema, and decompensation with resultant heart failure; weight gain; swelling or edema; medication-induced neural toxicity; particulate matter embolism and blood vessel occlusion with resultant organ, and/or nervous system infarction; and/or aseptic necrosis of one or more joints. Finally, the patient was informed that Medicine is not an exact science; therefore, there is also the possibility of unforeseen or unpredictable risks and/or possible complications that may result in a catastrophic outcome. The patient indicated having understood very clearly. We have given the patient no guarantees and we have made no promises. Enough time was given to the patient to ask questions, all of which were answered to the patient's satisfaction. Brianna Arnold has indicated that she wanted to continue with the procedure. Attestation: I, the ordering provider, attest that I have discussed with the patient the benefits, risks, side-effects, alternatives, likelihood of achieving goals, and potential problems during recovery for the procedure that I have provided informed  consent. Date  Time: 01/08/2024  1:57 PM  Pre-Procedure Preparation:  Monitoring: As per clinic protocol. Respiration, ETCO2, SpO2, BP, heart rate and rhythm monitor placed and checked for adequate function Safety Precautions: Patient was assessed for positional comfort and pressure points before starting the procedure. Time-out: I initiated and conducted the Time-out before starting the procedure, as per protocol. The patient was asked to participate by confirming the accuracy of the Time Out information. Verification of the correct person, site, and procedure were performed and confirmed by me, the nursing staff, and the patient. Time-out conducted as per Joint Commission's Universal Protocol (UP.01.01.01). Time: 1438  Description of Procedure:          Area Prepped: Entire Lower Lumbosacral Region & Right cervical region DuraPrep (Iodine Povacrylex [0.7% available iodine] and Isopropyl Alcohol, 74% w/w) Safety Precautions: Aspiration looking for blood return was conducted prior to all injections. At no point did we inject any substances, as a needle was being advanced. No attempts were made at seeking any paresthesias. Safe injection practices and needle disposal techniques used. Medications properly checked for expiration dates. SDV (single dose vial) medications used. Description of the Procedure: Protocol guidelines were followed. The patient was placed in position over the fluoroscopy table. The target area was identified and the area prepped in the usual manner. Skin & deeper tissues infiltrated with local anesthetic. Appropriate amount of time allowed to pass for local anesthetics to take effect. The procedure needles were then advanced to the target area. Proper needle placement secured. Negative aspiration confirmed. Solution injected in intermittent fashion, asking for systemic symptoms every 0.5cc of injectate. The needles were then removed and the area cleansed, making sure to leave  some of the prepping solution back to take advantage of its long term bactericidal properties.  Vitals:   01/08/24 1412 01/08/24 1438 01/08/24 1444 01/08/24 1447  BP: (!) 140/97 (!) 161/115 (!) 163/126 (!) 157/99  Pulse: 89 88 82  Resp: 18 17 15    Temp: 97.9 F (36.6 C)     TempSrc: Temporal     SpO2: 100% 100% 100%   Weight: 150 lb (68 kg)     Height: 4' 7.5 (1.41 m)           Start Time: 1438 hrs. End Time: 1444 hrs. Materials:  Needle(s) Type: Regular needle Gauge: 27G Length: 1.5-in  Medication(s): Please see orders for medications and dosing details.   13 trigger points were also injected in the right and left cervical, right and left trapezius, & lumbar paraspinal region with dry needling performed.  Each trigger point was injected with 0.5 to 1 cc of 0.2% ropivacaine .   Post-operative Assessment:  Post-procedure Vital Signs:  Pulse/HCG Rate: 82  Temp: 97.9 F (36.6 C) Resp: 15 BP: (!) 157/99 SpO2: 100 %  EBL: None  Complications: No immediate post-treatment complications observed by team, or reported by patient.  Note: The patient tolerated the entire procedure well. A repeat set of vitals were taken after the procedure and the patient was kept under observation following institutional policy, for this type of procedure. Post-procedural neurological assessment was performed, showing return to baseline, prior to discharge. The patient was provided with post-procedure discharge instructions, including a section on how to identify potential problems. Should any problems arise concerning this procedure, the patient was given instructions to immediately contact us , at any time, without hesitation. In any case, we plan to contact the patient by telephone for a follow-up status report regarding this interventional procedure.  Comments:  No additional relevant information.  Plan of Care   Medications ordered for procedure: Meds ordered this encounter  Medications    diazepam  (VALIUM ) tablet 5 mg    Make sure Flumazenil is available in the pyxis when using this medication. If oversedation occurs, administer 0.2 mg IV over 15 sec. If after 45 sec no response, administer 0.2 mg again over 1 min; may repeat at 1 min intervals; not to exceed 4 doses (1 mg)   ropivacaine  (PF) 2 mg/mL (0.2%) (NAROPIN ) injection 18 mL   Pt experiencing increased right hip and right sij pain with radiation to legs No orders of the defined types were placed in this encounter.    Medications administered: We administered diazepam  and ropivacaine  (PF) 2 mg/mL (0.2%).  See the medical record for exact dosing, route, and time of administration.  Follow-up plan:   No follow-ups on file.       Pharmacological management options:  Opioid Analgesics: Butrans  patch, side effects at 7.5 mcg an hour, analgesic benefit with no side effects at 5 mcg an hour, continue  Membrane stabilizer: Continue gabapentin  as prescribed  Muscle relaxant: Continue Flexeril  as prescribed  NSAID: To be determined at a later time  Other analgesic(s): To be determined at a later time    Interventional management options: Ms. Campus was informed that there is no guarantee that she would be a candidate for interventional therapies. The decision will be based on the results of diagnostic studies, as well as Ms. Rincon's risk profile.  Procedure(s) hx Left axillary peripheral nerve stimulation: 12/30/19 providing significant pain relief.  Removed 02/29/2020              Recent Visits Date Type Provider Dept  12/11/23 Procedure visit Marcelino Nurse, MD [facility]  11/06/23 Procedure visit Marcelino Nurse, MD [facility]  10/16/23 Procedure visit Marcelino Nurse, MD [facility]  10/15/23 Office Visit Patel, Seema K,  NP [facility]  Showing recent visits within past 90 days and meeting all other requirements Today's Visits Date Type Provider Dept  01/08/24 Procedure  visit Marcelino Nurse, MD [facility]  Showing today's visits and meeting all other requirements Future Appointments Date Type Provider Dept  01/13/24 Appointment Patel, Seema K, NP [facility]  02/05/24 Appointment Marcelino Nurse, MD [facility]  Showing future appointments within next 90 days and meeting all other requirements  Disposition: Discharge home  Discharge (Date  Time): 01/08/2024; 1450 hrs.   Primary Care Physician: Auston Reyes BIRCH, MD Location: [facility] Note by: Nurse Marcelino, MD Date: 01/08/2024; Time: 2:52 PM  Disclaimer:  Medicine is not an exact science. The only guarantee in medicine is that nothing is guaranteed. It is important to note that the decision to proceed with this intervention was based on the information collected from the patient. The Data and conclusions were drawn from the patient's questionnaire, the interview, and the physical examination. Because the information was provided in large part by the patient, it cannot be guaranteed that it has not been purposely or unconsciously manipulated. Every effort has been made to obtain as much relevant data as possible for this evaluation. It is important to note that the conclusions that lead to this procedure are derived in large part from the available data. Always take into account that the treatment will also be dependent on availability of resources and existing treatment guidelines, considered by other Pain Management Practitioners as being common knowledge and practice, at the time of the intervention. For Medico-Legal purposes, it is also important to point out that variation in procedural techniques and pharmacological choices are the acceptable norm. The indications, contraindications, technique, and results of the above procedure should only be interpreted and judged by a Board-Certified Interventional Pain Specialist with extensive familiarity and  expertise in the same exact procedure and technique.

## 2024-01-09 ENCOUNTER — Telehealth: Payer: Self-pay | Admitting: *Deleted

## 2024-01-09 NOTE — Telephone Encounter (Signed)
 Post procedure call; voicemail left

## 2024-01-13 ENCOUNTER — Encounter: Payer: Self-pay | Admitting: Nurse Practitioner

## 2024-01-13 ENCOUNTER — Ambulatory Visit: Attending: Nurse Practitioner | Admitting: Nurse Practitioner

## 2024-01-13 VITALS — BP 127/82 | HR 78 | Temp 98.2°F | Resp 16 | Ht <= 58 in | Wt 150.0 lb

## 2024-01-13 DIAGNOSIS — M542 Cervicalgia: Secondary | ICD-10-CM | POA: Insufficient documentation

## 2024-01-13 DIAGNOSIS — M7918 Myalgia, other site: Secondary | ICD-10-CM | POA: Insufficient documentation

## 2024-01-13 DIAGNOSIS — Z981 Arthrodesis status: Secondary | ICD-10-CM | POA: Diagnosis not present

## 2024-01-13 DIAGNOSIS — Z79899 Other long term (current) drug therapy: Secondary | ICD-10-CM | POA: Insufficient documentation

## 2024-01-13 DIAGNOSIS — G894 Chronic pain syndrome: Secondary | ICD-10-CM | POA: Insufficient documentation

## 2024-01-13 DIAGNOSIS — M4105 Infantile idiopathic scoliosis, thoracolumbar region: Secondary | ICD-10-CM | POA: Insufficient documentation

## 2024-01-13 MED ORDER — TRAMADOL HCL 50 MG PO TABS: 50.0000 mg | ORAL_TABLET | Freq: Two times a day (BID) | ORAL | 3 refills | Status: DC | PRN

## 2024-01-13 NOTE — Progress Notes (Signed)
 Nursing Pain Medication Assessment:  Safety precautions to be maintained throughout the outpatient stay will include: orient to surroundings, keep bed in low position, maintain call bell within reach at all times, provide assistance with transfer out of bed and ambulation.  Medication Inspection Compliance: Pill count conducted under aseptic conditions, in front of the patient. Neither the pills nor the bottle was removed from the patient's sight at any time. Once count was completed pills were immediately returned to the patient in their original bottle.  Medication: Buprenorphine  patch (Butrans ) Pill/Patch Count: 3 of 4 patches remain Pill/Patch Appearance: Markings consistent with prescribed medication Bottle Appearance: Standard pharmacy container. Clearly labeled. Filled Date: 09 / 17 / 2025 Last Medication intake:  Yesterday

## 2024-01-13 NOTE — Progress Notes (Signed)
 PROVIDER NOTE: Interpretation of information contained herein should be left to medically-trained personnel. Specific patient instructions are provided elsewhere under Patient Instructions section of medical record. This document was created in part using AI and STT-dictation technology, any transcriptional errors that may result from this process are unintentional.  Patient: Brianna Arnold  Service: E/M   PCP: Auston Reyes BIRCH, MD  DOB: Apr 30, 1990  DOS: 01/13/2024  Provider: Emmy MARLA Blanch, NP  MRN: [id number]  Delivery: Face-to-face  Specialty: Interventional Pain Management  Type: Established Patient  Setting: Ambulatory outpatient facility  Specialty designation: 09  Referring Prov.: Auston Reyes BIRCH, MD  Location: Outpatient office facility       History of present illness (HPI) Brianna Arnold, a 33 y.o. year old female, is here today because of her Chronic pain syndrome [G89.4]. Brianna Arnold's primary complain today is Back Pain  Pertinent problems: Brianna Arnold has  S/P cervical spine fusion; Migraine without aura and without status migrainosus, not intractable; History of lumbar spinal fusion (T11-illiac); Cervicalgia; Chronic left shoulder pain; Disorder of left rotator cuff; and Chronic pain syndrome on their pertinent problem list.  Pain Assessment: Severity of Chronic pain is reported as a 6 /10. Location: Back Lower/Radiates to left hip and left leg. Onset: More than a month ago. Quality: Aching, Shooting, Stabbing. Timing: Constant. Modifying factor(s): TPI, Meds. ICE, Rest, Heat. Vitals:  height is 4' 7 (1.397 m) and weight is 150 lb (68 kg). Her temperature is 98.2 F (36.8 C). Her blood pressure is 127/82 and her pulse is 78. Her respiration is 16 and oxygen saturation is 100%.  BMI: Estimated body mass index is 34.86 kg/m as calculated from the following:   Height as of this encounter: 4' 7 (1.397 m).   Weight as of this encounter: 150 lb (68 kg).  Last  encounter: 10/15/2023. Last procedure: 01/08/2024  Reason for encounter: medication management. No change in medical history since last visit.  Patient's pain is at baseline.  Patient continues multimodal pain regimen as prescribed.  States that it provides pain relief and improvement in functional status. Brianna Arnold continues struggling with lower back pain; however she recently received a lumbar TPI on 9/17 /2025.  I advised her that if the pain persist and does not provide adequate relief, she should keep her schedule follow-up visit with Dr. Marcelino.  Pharmacotherapy Assessment   Analgesic: Tramadol  (Ultram ) 50 mg tablet every 12 hours as needed for pain. MME=20 Monitoring: [place] PMP: PDMP reviewed during this encounter.       Pharmacotherapy: No side-effects or adverse reactions reported. Compliance: No problems identified. Effectiveness: Clinically acceptable.  Erlene Doyal SAUNDERS, [place]  01/13/2024  9:03 AM  Sign when Signing Visit Nursing Pain Medication Assessment:  Safety precautions to be maintained throughout the outpatient stay will include: orient to surroundings, keep bed in low position, maintain call bell within reach at all times, provide assistance with transfer out of bed and ambulation.  Medication Inspection Compliance: Pill count conducted under aseptic conditions, in front of the patient. Neither the pills nor the bottle was removed from the patient's sight at any time. Once count was completed pills were immediately returned to the patient in their original bottle.  Medication: Buprenorphine  patch (Butrans ) Pill/Patch Count: 3 of 4 patches remain Pill/Patch Appearance: Markings consistent with prescribed medication Bottle Appearance: Standard pharmacy container. Clearly labeled. Filled Date: 09 / 17 / 2025 Last Medication intake:  Nilsa Erlene Doyal SAUNDERS, CMA  01/13/2024  9:03 AM  Sign when Signing Visit Nursing Pain Medication Assessment:  Safety precautions to be  maintained throughout the outpatient stay will include: orient to surroundings, keep bed in low position, maintain call bell within reach at all times, provide assistance with transfer out of bed and ambulation.  Medication Inspection Compliance: Pill count conducted under aseptic conditions, in front of the patient. Neither the pills nor the bottle was removed from the patient's sight at any time. Once count was completed pills were immediately returned to the patient in their original bottle.  Medication: Tramadol  (Ultram ) Pill/Patch Count: 46 of 60 pills/patches remain Pill/Patch Appearance: Markings consistent with prescribed medication Bottle Appearance: Standard pharmacy container. Clearly labeled. Filled Date: 06 / 24 / 2025 Last Medication intake:  Day before yesterday    UDS:  Summary  Date Value Ref Range Status  10/15/2023 FINAL  Final    Comment:    ==================================================================== [employer] [facility] 13 (MW) ==================================================================== Test                             Result       Flag       Units  Drug Present and Declared for Prescription Verification   Tramadol                        213          EXPECTED   ng/mg creat   O-Desmethyltramadol            615          EXPECTED   ng/mg creat    Source of tramadol  is a prescription medication. O-desmethyltramadol    is an expected metabolite of tramadol .  Drug Absent but Declared for Prescription Verification   Buprenorphine                   Not Detected UNEXPECTED ng/mg creat    Transdermal buprenorphine , as indicated in the declared medication    list, is not always detected even when used as directed.  ==================================================================== Test                      Result    Flag   Units      Ref Range   Creatinine              88               mg/dL       >=79 ==================================================================== Declared Medications:  The flagging and interpretation on this report are based on the  following declared medications.  Unexpected results may arise from  inaccuracies in the declared medications.   **Note: The testing scope of this panel includes these medications:   Tramadol  (Ultram )   **Note: The testing scope of this panel does not include small to  moderate amounts of these reported medications:   Buprenorphine  Patch (BuTrans )   **Note: The testing scope of this panel does not include the  following reported medications:   Albuterol (Ventolin HFA)  Cyanocobalamin   Cyclobenzaprine  (Flexeril )  Famotidine  (Pepcid )  Gabapentin   Iron  Medroxyprogesterone  (Depo-Provera )  Oxybutynin (Ditropan)  Propranolol (Inderal)  Venlafaxine (Effexor)  Vitamin B12  Vitamin D2  Vitamin D3  Zolpidem (Ambien) ==================================================================== For clinical consultation, please call [phone]. ====================================================================     No results found for: CBDTHCR No results found for: D8THCCBX No results found for: D9THCCBX  ROS  Constitutional: Denies  any fever or chills Gastrointestinal: No reported hemesis, hematochezia, vomiting, or acute GI distress Musculoskeletal: low back pain (R>L) Neurological: No reported episodes of acute onset apraxia, aphasia, dysarthria, agnosia, amnesia, paralysis, loss of coordination, or loss of consciousness  Medication Review  Vitamin D3, albuterol, buprenorphine , cyanocobalamin , cyclobenzaprine , ergocalciferol, famotidine , ferrous sulfate , gabapentin , medroxyPROGESTERone , oxyBUTYnin, propranolol ER, traMADol , venlafaxine XR, vitamin B-12, and zolpidem  History Review  Allergy: Ms. Ruffalo is allergic to hypafix [wound dressings], ativan  [lorazepam ], and tizanidine. Drug: Ms. Boza  reports no  history of drug use. Alcohol:  has no history on file for alcohol use. Tobacco:  reports that she has never smoked. She has never used smokeless tobacco. Social: Ms. Estabrooks  reports that she has never smoked. She has never used smokeless tobacco. She reports that she does not use drugs. Medical:  has a past medical history of Anemia and Jarcho-Levin syndrome. Surgical: Ms. Gimpel  has a past surgical history that includes Cervical spine surgery (07/2014); Back surgery (10/2014); and Anterior cervical decomp/discectomy fusion (N/A, 02/11/2019). Family: family history includes Hypertension in her father.  Laboratory Chemistry Profile   Renal Lab Results  Component Value Date   BUN 16 08/07/2023   CREATININE 0.75 08/07/2023   GFRAA >60 02/15/2019   GFRNONAA >60 08/07/2023    Hepatic Lab Results  Component Value Date   AST 23 08/07/2023   ALT 19 08/07/2023   ALBUMIN 3.6 08/07/2023   ALKPHOS 50 08/07/2023   LIPASE 18 02/14/2019    Electrolytes Lab Results  Component Value Date   NA 139 08/07/2023   K 4.6 08/07/2023   CL 111 08/07/2023   CALCIUM 9.1 08/07/2023   MG 2.2 08/07/2023    Bone No results found for: VD25OH, CI874NY7UNU, CI6874NY7, CI7874NY7, 25OHVITD1, 25OHVITD2, 25OHVITD3, TESTOFREE, TESTOSTERONE  Inflammation (CRP: Acute Phase) (ESR: Chronic Phase) Lab Results  Component Value Date   LATICACIDVEN 1.6 04/19/2015         Note: Above Lab results reviewed.  Recent Imaging Review  CT Soft Tissue Neck W Contrast CLINICAL DATA:  post-op swelling/pain  EXAM: CT NECK WITH CONTRAST  TECHNIQUE: Multidetector CT imaging of the neck was performed using the standard protocol following the bolus administration of intravenous contrast.  RADIATION DOSE REDUCTION: This exam was performed according to the departmental dose-optimization program which includes automated exposure control, adjustment of the mA and/or kV according to patient size and/or use  of iterative reconstruction technique.  CONTRAST:  75mL OMNIPAQUE  IOHEXOL  300 MG/ML  SOLN  COMPARISON:  None Available.  FINDINGS: Pharynx and larynx: Status post tonsillectomy with nonspecific mild edema in the surrounding oropharynx. No discrete, drainable fluid collection. No mass lesion.  Salivary glands: No inflammation, mass, or stone.  Thyroid : Normal.  Lymph nodes: Mildly prominent upper cervical chain lymph nodes, likely reactive but nonspecific.  Vascular: Unremarkable on limited assessment due to non arterial timing.  Limited intracranial: Negative.  Visualized orbits: Negative.  Mastoids and visualized paranasal sinuses: Largely clear sinuses. No mastoid effusions.  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  Upper chest: Visualized lung apices are clear.  IMPRESSION: Status post tonsillectomy with nonspecific mild edema in the surrounding oropharynx. This could be postoperative given recent surgery, but superimposed infection is not excluded. No discrete, drainable fluid collection.  Electronically Signed   By: Gilmore GORMAN Molt M.D.   On: 08/06/2023 01:20 Note: Reviewed        Physical Exam  Vitals: BP 127/82 (BP Location: Left Arm, Patient Position: Sitting)  Pulse 78   Temp 98.2 F (36.8 C)   Resp 16   Ht 4' 7 (1.397 m)   Wt 150 lb (68 kg)   SpO2 100%   BMI 34.86 kg/m  BMI: Estimated body mass index is 34.86 kg/m as calculated from the following:   Height as of this encounter: 4' 7 (1.397 m).   Weight as of this encounter: 150 lb (68 kg). Ideal: Patient must be at least 60 in tall to calculate ideal body weight General appearance: Well nourished, well developed, and well hydrated. In no apparent acute distress Mental status: Alert, oriented x 3 (person, place, & time)       Respiratory: No evidence of acute respiratory distress Eyes: [name]   Assessment   Diagnosis Status  1. Chronic  pain syndrome   2. Myofascial pain syndrome of lumbar spine   3. Cervicalgia   4. Infantile idiopathic scoliosis of thoracolumbar region   5. History of lumbar spinal fusion (T11- iliac)   6. Medication management    Controlled Controlled Controlled   Updated Problems: No problems updated.  Plan of Care  Problem-specific:  Assessment and Plan  We will continue on current medication regimen.  Prescribing drug monitoring (PDMP) reviewed; findings consistent with the use of prescribed medication and no evidence of narcotic misuse or abuse. Urine drug screening (UDS) up to date. No other new issues or problem reported to this visit. Schedule follow-up in 90 days for medication management.  I did not refill her Butrans  patches as she has an adequate supply until her next visit.  Ms. Ayaana Biondo Dekoning has a current medication list which includes the following long-term medication(s): albuterol, famotidine , ferrous sulfate , gabapentin , medroxyprogesterone , propranolol er, and zolpidem.  Pharmacotherapy (Medications Ordered): Meds ordered this encounter  Medications   traMADol  (ULTRAM ) 50 MG tablet    Sig: Take 1 tablet (50 mg total) by mouth every 12 (twelve) hours as needed for severe pain (pain score 7-10).    Dispense:  60 tablet    Refill:  3    Fill one day early if pharmacy is closed on scheduled refill date.   Orders:  No orders of the defined types were placed in this encounter.       Return in about 3 months (around 04/13/2024) for (F2F), (MM), Emmy Blanch NP.    Recent Visits Date Type Provider Dept  01/08/24 Procedure visit Marcelino Nurse, MD [facility]  12/11/23 Procedure visit Marcelino Nurse, MD [facility]  11/06/23 Procedure visit Marcelino Nurse, MD [facility]  10/16/23 Procedure visit Marcelino Nurse, MD [facility]  10/15/23 Office Visit Crystal Scarberry K, NP [facility]  Showing recent visits within past 90  days and meeting all other requirements Today's Visits Date Type Provider Dept  01/13/24 Office Visit Raffaele Derise K, NP [facility]  Showing today's visits and meeting all other requirements Future Appointments Date Type Provider Dept  02/05/24 Appointment Marcelino Nurse, MD [facility]  04/09/24 Appointment Ermina Oberman K, NP [facility]  Showing future appointments within next 90 days and meeting all other requirements  I discussed the assessment and treatment plan with the patient. The patient was provided an opportunity to ask questions and all were answered. The patient agreed with the plan and demonstrated an understanding of the instructions.  Patient advised to call back or seek an in-person evaluation if the symptoms or condition worsens.  I personally spent a total of 30  minutes in the care of the patient today including preparing to see the patient, getting/reviewing separately obtained history, performing a medically appropriate exam/evaluation, counseling and educating, placing orders, documenting clinical information in the EHR, and independently interpreting results.   Duration of encounter:  minutes.  Total time on encounter, as per AMA guidelines included both the face-to-face and non-face-to-face time personally spent by the physician and/or other qualified health care professional(s) on the day of the encounter (includes time in activities that require the physician or other qualified health care professional and does not include time in activities normally performed by clinical staff). Physician's time may include the following activities when performed: Preparing to see the patient (e.g., pre-charting review of records, searching for previously ordered imaging, lab work, and nerve conduction tests) Review of prior analgesic pharmacotherapies. Reviewing PMP Interpreting ordered tests (e.g., lab work, imaging, nerve conduction tests) Performing  post-procedure evaluations, including interpretation of diagnostic procedures Obtaining and/or reviewing separately obtained history Performing a medically appropriate examination and/or evaluation Counseling and educating the patient/family/caregiver Ordering medications, tests, or procedures Referring and communicating with other health care professionals (when not separately reported) Documenting clinical information in the electronic or other health record Independently interpreting results (not separately reported) and communicating results to the patient/ family/caregiver Care coordination (not separately reported)  Note by: Doroteo Nickolson K Ewing Fandino, NP (TTS and AI technology used. I apologize for any typographical errors that were not detected and corrected.) Date: 01/13/2024; Time: 10:13 AM

## 2024-01-13 NOTE — Progress Notes (Signed)
 Nursing Pain Medication Assessment:  Safety precautions to be maintained throughout the outpatient stay will include: orient to surroundings, keep bed in low position, maintain call bell within reach at all times, provide assistance with transfer out of bed and ambulation.  Medication Inspection Compliance: Pill count conducted under aseptic conditions, in front of the patient. Neither the pills nor the bottle was removed from the patient's sight at any time. Once count was completed pills were immediately returned to the patient in their original bottle.  Medication: Tramadol  (Ultram ) Pill/Patch Count: 46 of 60 pills/patches remain Pill/Patch Appearance: Markings consistent with prescribed medication Bottle Appearance: Standard pharmacy container. Clearly labeled. Filled Date: 06 / 24 / 2025 Last Medication intake:  Day before yesterday

## 2024-01-21 DIAGNOSIS — I10 Essential (primary) hypertension: Secondary | ICD-10-CM | POA: Diagnosis not present

## 2024-01-21 DIAGNOSIS — G9589 Other specified diseases of spinal cord: Secondary | ICD-10-CM | POA: Diagnosis not present

## 2024-01-21 DIAGNOSIS — Z79899 Other long term (current) drug therapy: Secondary | ICD-10-CM | POA: Diagnosis not present

## 2024-01-21 DIAGNOSIS — E538 Deficiency of other specified B group vitamins: Secondary | ICD-10-CM | POA: Diagnosis not present

## 2024-01-21 DIAGNOSIS — E559 Vitamin D deficiency, unspecified: Secondary | ICD-10-CM | POA: Diagnosis not present

## 2024-02-01 ENCOUNTER — Encounter: Payer: Self-pay | Admitting: Student in an Organized Health Care Education/Training Program

## 2024-02-05 ENCOUNTER — Other Ambulatory Visit: Payer: Self-pay | Admitting: Student in an Organized Health Care Education/Training Program

## 2024-02-05 ENCOUNTER — Encounter: Payer: Self-pay | Admitting: Student in an Organized Health Care Education/Training Program

## 2024-02-05 ENCOUNTER — Ambulatory Visit: Admitting: Student in an Organized Health Care Education/Training Program

## 2024-02-05 ENCOUNTER — Ambulatory Visit
Admission: RE | Admit: 2024-02-05 | Discharge: 2024-02-05 | Disposition: A | Discharge status: Home / Self Care | Source: Ambulatory Visit | Attending: Student in an Organized Health Care Education/Training Program | Admitting: Student in an Organized Health Care Education/Training Program

## 2024-02-05 VITALS — BP 134/99 | HR 96 | Temp 97.0°F | Resp 16 | Ht <= 58 in | Wt 150.0 lb

## 2024-02-05 DIAGNOSIS — M7918 Myalgia, other site: Secondary | ICD-10-CM | POA: Diagnosis not present

## 2024-02-05 DIAGNOSIS — G5702 Lesion of sciatic nerve, left lower limb: Secondary | ICD-10-CM | POA: Insufficient documentation

## 2024-02-05 DIAGNOSIS — G894 Chronic pain syndrome: Secondary | ICD-10-CM

## 2024-02-05 MED ORDER — ROPIVACAINE HCL 2 MG/ML IJ SOLN
INTRAMUSCULAR | Status: AC
  Filled 2024-02-05: qty 20

## 2024-02-05 MED ORDER — IOHEXOL 180 MG/ML  SOLN
INTRAMUSCULAR | Status: AC
  Filled 2024-02-05: qty 20

## 2024-02-05 MED ORDER — DEXAMETHASONE SOD PHOSPHATE PF 10 MG/ML IJ SOLN: 20.0000 mg | Freq: Once | INTRAMUSCULAR | Status: DC

## 2024-02-05 MED ORDER — ROPIVACAINE HCL 2 MG/ML IJ SOLN: 18.0000 mL | Freq: Once | INTRAMUSCULAR | Status: DC

## 2024-02-05 MED ORDER — IOHEXOL 180 MG/ML  SOLN: 10.0000 mL | Freq: Once | INTRAMUSCULAR | Status: DC

## 2024-02-05 NOTE — Patient Instructions (Signed)

## 2024-02-05 NOTE — Progress Notes (Signed)
 PROVIDER NOTE: Information contained herein reflects review and annotations entered in association with encounter. Interpretation of such information and data should be left to medically-trained personnel. Information provided to patient can be located elsewhere in the medical record under Patient Instructions. Document created using STT-dictation technology, any transcriptional errors that may result from process are unintentional.    Patient: Brianna Arnold  Service Category: Procedure  Provider: Wallie Sherry, MD  DOB: Oct 14, 1990  DOS: 02/05/2024  Location: [facility]  MRN: [id number]  Setting: Ambulatory - outpatient  Referring Provider: Auston Reyes BIRCH, MD  Type: Established Patient  Specialty: Interventional Pain Management  PCP: Auston Reyes BIRCH, MD   Primary Reason for Visit: Interventional Pain Management Treatment. CC: back pain  Procedure:          Anesthesia, Analgesia, Anxiolysis:  Procedure 1: Lumbar Paraspinal Muscle Trigger Point Injection (3+)         Quadratus lumborum, erector spinae, multifidus and periscapular region and bilateral cervical/trapezius TPI.  Procedure 2: LEFT Piriformis TPI under fluoroscopy CPT: 20553 Primary Purpose: Therapeutic Approach: Percutaneous, ipsilateral approach. Laterality: Midline        Type: Local Anesthesia    Position: Prone   Indications: 1. Chronic pain syndrome   2. Myofascial pain syndrome of lumbar spine   3. Piriformis syndrome, left       Pain Score: Pre-procedure: 6 /10 Post-procedure: 6 /10   Pre-op H&P Assessment:  Brianna Arnold is a 33 y.o. (year old), female patient, seen today for interventional treatment. She  has a past surgical history that includes Cervical spine surgery (07/2014); Back surgery (10/2014); and Anterior cervical decomp/discectomy fusion (N/A, 02/11/2019). Brianna Arnold has a current medication list which includes the following prescription(s): albuterol, buprenorphine ,  vitamin d3, cyanocobalamin , cyclobenzaprine , ergocalciferol, famotidine , ferrous sulfate , gabapentin , medroxyprogesterone , oxybutynin, propranolol er, tramadol , venlafaxine xr, vitamin b-12, and zolpidem, and the following Facility-Administered Medications: dexamethasone , iohexol , and ropivacaine  (pf) 2 mg/ml (0.2%). Her primarily concern today is the Back Pain (Sacrum and neck at the moment )   Initial Vital Signs:  Pulse/HCG Rate: 96  Temp: (!) 97 F (36.1 C) Resp: 16 BP: (!) 134/99 SpO2: 98 %  BMI: Estimated body mass index is 34.24 kg/m as calculated from the following:   Height as of this encounter: 4' 7.5 (1.41 m).   Weight as of this encounter: 150 lb (68 kg).  Risk Assessment: Allergies: Reviewed. She is allergic to hypafix [wound dressings], ativan  [lorazepam ], and tizanidine.  Allergy Precautions: None required Coagulopathies: Reviewed. None identified.  Blood-thinner therapy: None at this time Active Infection(s): Reviewed. None identified. [name] is afebrile  Site Confirmation: Brianna Arnold was asked to confirm the procedure and laterality before marking the site Procedure checklist: Completed Consent: Before the procedure and under the influence of no sedative(s), amnesic(s), or anxiolytics, the patient was informed of the treatment options, risks and possible complications. To fulfill our ethical and legal obligations, as recommended by the American Medical Association's Code of Ethics, I have informed the patient of my clinical impression; the nature and purpose of the treatment or procedure; the risks, benefits, and possible complications of the intervention; the alternatives, including doing nothing; the risk(s) and benefit(s) of the alternative treatment(s) or procedure(s); and the risk(s) and benefit(s) of doing nothing. The patient was provided information about the general risks and possible complications associated with the procedure. These may include, but are not  limited to: failure to achieve desired goals, infection, bleeding, organ or nerve damage, allergic reactions,  paralysis, and death. In addition, the patient was informed of those risks and complications associated to the procedure, such as failure to decrease pain; infection; bleeding; organ or nerve damage with subsequent damage to sensory, motor, and/or autonomic systems, resulting in permanent pain, numbness, and/or weakness of one or several areas of the body; allergic reactions; (i.e.: anaphylactic reaction); and/or death. Furthermore, the patient was informed of those risks and complications associated with the medications. These include, but are not limited to: allergic reactions (i.e.: anaphylactic or anaphylactoid reaction(s)); adrenal axis suppression; blood sugar elevation that in diabetics may result in ketoacidosis or comma; water retention that in patients with history of congestive heart failure may result in shortness of breath, pulmonary edema, and decompensation with resultant heart failure; weight gain; swelling or edema; medication-induced neural toxicity; particulate matter embolism and blood vessel occlusion with resultant organ, and/or nervous system infarction; and/or aseptic necrosis of one or more joints. Finally, the patient was informed that Medicine is not an exact science; therefore, there is also the possibility of unforeseen or unpredictable risks and/or possible complications that may result in a catastrophic outcome. The patient indicated having understood very clearly. We have given the patient no guarantees and we have made no promises. Enough time was given to the patient to ask questions, all of which were answered to the patient's satisfaction. Brianna Arnold has indicated that she wanted to continue with the procedure. Attestation: I, the ordering provider, attest that I have discussed with the patient the benefits, risks, side-effects, alternatives, likelihood of achieving  goals, and potential problems during recovery for the procedure that I have provided informed consent. Date  Time: 02/05/2024  1:54 PM  Pre-Procedure Preparation:  Monitoring: As per clinic protocol. Respiration, ETCO2, SpO2, BP, heart rate and rhythm monitor placed and checked for adequate function Safety Precautions: Patient was assessed for positional comfort and pressure points before starting the procedure. Time-out: I initiated and conducted the Time-out before starting the procedure, as per protocol. The patient was asked to participate by confirming the accuracy of the Time Out information. Verification of the correct person, site, and procedure were performed and confirmed by me, the nursing staff, and the patient. Time-out conducted as per Joint Commission's Universal Protocol (UP.01.01.01). Time: 1515  Description of Procedure:          Area Prepped: Entire Lower Lumbosacral Region & Right cervical region DuraPrep (Iodine Povacrylex [0.7% available iodine] and Isopropyl Alcohol, 74% w/w) Safety Precautions: Aspiration looking for blood return was conducted prior to all injections. At no point did we inject any substances, as a needle was being advanced. No attempts were made at seeking any paresthesias. Safe injection practices and needle disposal techniques used. Medications properly checked for expiration dates. SDV (single dose vial) medications used. Description of the Procedure: Protocol guidelines were followed. The patient was placed in position over the fluoroscopy table. The target area was identified and the area prepped in the usual manner. Skin & deeper tissues infiltrated with local anesthetic. Appropriate amount of time allowed to pass for local anesthetics to take effect. The procedure needles were then advanced to the target area. Proper needle placement secured. Negative aspiration confirmed. Solution injected in intermittent fashion, asking for systemic symptoms every  0.5cc of injectate. The needles were then removed and the area cleansed, making sure to leave some of the prepping solution back to take advantage of its long term bactericidal properties.  Vitals:   02/05/24 1418  BP: (!) 134/99  Pulse: 96  Resp: 16  Temp: (!) 97 F (36.1 C)  TempSrc: Temporal  SpO2: 98%  Weight: 150 lb (68 kg)  Height: 4' 7.5 (1.41 m)        Start Time: 1515 hrs. End Time: 1525 hrs. Materials:  Needle(s) Type: Regular needle Gauge: 27G Length: 1.5-in  Medication(s): Please see orders for medications and dosing details. 13 trigger points were also injected in the right and left cervical, right and left trapezius, & lumbar paraspinal region with dry needling performed.  Each trigger point was injected with 0.5 to 1 cc of 0.2% ropivacaine .  A LEFT piriformis injection was also performed under fluoroscopy- 1 cm inferior, 1 cm lateral and 1 cm deep to the inferior sacroiliac fissure with contrast highlighting piriformis muscle striations.  5 cc solution made of 4 cc of 0.2% ropivacaine , 1 cc of Decadron  10 mg/cc was injected into the LEFT piriformis muscle under fluoroscopy.  Patient denied any pain radiating down her LEFT leg during injection.   Post-operative Assessment:  Post-procedure Vital Signs:  Pulse/HCG Rate: 96  Temp: (!) 97 F (36.1 C) Resp: 16 BP: (!) 134/99 SpO2: 98 %  EBL: None  Complications: No immediate post-treatment complications observed by team, or reported by patient.  Note: The patient tolerated the entire procedure well. A repeat set of vitals were taken after the procedure and the patient was kept under observation following institutional policy, for this type of procedure. Post-procedural neurological assessment was performed, showing return to baseline, prior to discharge. The patient was provided with post-procedure discharge instructions, including a section on how to identify potential problems. Should any problems arise  concerning this procedure, the patient was given instructions to immediately contact us , at any time, without hesitation. In any case, we plan to contact the patient by telephone for a follow-up status report regarding this interventional procedure.  Comments:  No additional relevant information.  Plan of Care   Medications ordered for procedure: Meds ordered this encounter  Medications   ropivacaine  (PF) 2 mg/mL (0.2%) (NAROPIN ) injection 18 mL   dexamethasone  (DECADRON ) injection 20 mg   iohexol  (OMNIPAQUE ) 180 MG/ML injection 10 mL    Must be Myelogram-compatible. If not available, you may substitute with a water-soluble, non-ionic, hypoallergenic, myelogram-compatible radiological contrast medium.    No orders of the defined types were placed in this encounter.    Medications administered: Nashonda M. Beecher Browning had no medications administered during this visit.  See the medical record for exact dosing, route, and time of administration.  Follow-up plan:   Return for patient will call to schedule F2F appt prn.       Pharmacological management options:  Opioid Analgesics: Butrans  patch, side effects at 7.5 mcg an hour, analgesic benefit with no side effects at 5 mcg an hour, continue  Membrane stabilizer: Continue gabapentin  as prescribed  Muscle relaxant: Continue Flexeril  as prescribed  NSAID: To be determined at a later time  Other analgesic(s): To be determined at a later time    Interventional management options: Ms. Vanengen was informed that there is no guarantee that she would be a candidate for interventional therapies. The decision will be based on the results of diagnostic studies, as well as Ms. Grondin's risk profile.  Procedure(s) hx Left axillary peripheral nerve stimulation: 12/30/19 providing significant pain relief.  Removed 02/29/2020              Recent Visits Date Type Provider Dept  01/13/24 Office Visit Patel, Seema K, NP [facility]  01/08/24 Procedure visit Marcelino Nurse, MD [facility]  12/11/23 Procedure visit Marcelino Nurse, MD [facility]  Showing recent visits within past 90 days and meeting all other requirements Today's Visits Date Type Provider Dept  02/05/24 Procedure visit Marcelino Nurse, MD [facility]  Showing today's visits and meeting all other requirements Future Appointments Date Type Provider Dept  03/04/24 Appointment Marcelino Nurse, MD [facility]  04/09/24 Appointment Patel, Seema K, NP [facility]  Showing future appointments within next 90 days and meeting all other requirements  Disposition: Discharge home  Discharge (Date  Time): 02/05/2024; 1527 hrs.   Primary Care Physician: Auston Reyes BIRCH, MD Location: [facility] Note by: Nurse Marcelino, MD Date: 02/05/2024; Time: 3:38 PM  Disclaimer:  Medicine is not an exact science. The only guarantee in medicine is that nothing is guaranteed. It is important to note that the decision to proceed with this intervention was based on the information collected from the patient. The Data and conclusions were drawn from the patient's questionnaire, the interview, and the physical examination. Because the information was provided in large part by the patient, it cannot be guaranteed that it has not been purposely or unconsciously manipulated. Every effort has been made to obtain as much relevant data as possible for this evaluation. It is important to note that the conclusions that lead to this procedure are derived in large part from the available data. Always take into account that the treatment will also be dependent on availability of resources and existing treatment guidelines, considered by other Pain Management Practitioners as being common knowledge and practice, at the time of the intervention. For Medico-Legal purposes, it is also important to point out that variation in  procedural techniques and pharmacological choices are the acceptable norm. The indications, contraindications, technique, and results of the above procedure should only be interpreted and judged by a Board-Certified Interventional Pain Specialist with extensive familiarity and expertise in the same exact procedure and technique.

## 2024-02-06 ENCOUNTER — Telehealth: Payer: Self-pay | Admitting: *Deleted

## 2024-02-06 NOTE — Telephone Encounter (Signed)
 No problems post procedure.

## 2024-02-18 DIAGNOSIS — Z3042 Encounter for surveillance of injectable contraceptive: Secondary | ICD-10-CM | POA: Diagnosis not present

## 2024-03-04 ENCOUNTER — Encounter: Payer: Self-pay | Admitting: Student in an Organized Health Care Education/Training Program

## 2024-03-04 ENCOUNTER — Ambulatory Visit
Attending: Student in an Organized Health Care Education/Training Program | Admitting: Student in an Organized Health Care Education/Training Program

## 2024-03-04 VITALS — BP 133/84 | HR 88 | Temp 97.7°F | Resp 16 | Ht <= 58 in | Wt 150.0 lb

## 2024-03-04 DIAGNOSIS — G894 Chronic pain syndrome: Secondary | ICD-10-CM | POA: Insufficient documentation

## 2024-03-04 DIAGNOSIS — M542 Cervicalgia: Secondary | ICD-10-CM | POA: Diagnosis not present

## 2024-03-04 DIAGNOSIS — M7918 Myalgia, other site: Secondary | ICD-10-CM | POA: Diagnosis not present

## 2024-03-04 MED ORDER — DIAZEPAM 5 MG PO TABS
5.0000 mg | ORAL_TABLET | ORAL | Status: AC
  Administered 2024-03-04: 5 mg via ORAL

## 2024-03-04 MED ORDER — DIAZEPAM 5 MG PO TABS
ORAL_TABLET | ORAL | Status: AC
  Filled 2024-03-04: qty 1

## 2024-03-04 MED ORDER — ROPIVACAINE HCL 2 MG/ML IJ SOLN
18.0000 mL | Freq: Once | INTRAMUSCULAR | Status: AC
  Administered 2024-03-04: 18 mL via PERINEURAL
  Filled 2024-03-04: qty 20

## 2024-03-04 NOTE — Progress Notes (Signed)
 PROVIDER NOTE: Information contained herein reflects review and annotations entered in association with encounter. Interpretation of such information and data should be left to medically-trained personnel. Information provided to patient can be located elsewhere in the medical record under Patient Instructions. Document created using STT-dictation technology, any transcriptional errors that may result from process are unintentional.    Patient: Brianna Arnold  Service Category: Procedure  Provider: Wallie Sherry, MD  DOB: 04-Feb-1991  DOS: 03/04/2024  Location: [facility]  MRN: [id number]  Setting: Ambulatory - outpatient  Referring Provider: Auston Reyes BIRCH, MD  Type: Established Patient  Specialty: Interventional Pain Management  PCP: Auston Reyes BIRCH, MD   Primary Reason for Visit: Interventional Pain Management Treatment. CC: back pain  Procedure:          Anesthesia, Analgesia, Anxiolysis:  Procedure 1: Lumbar Paraspinal Muscle Trigger Point Injection (3+)         Quadratus lumborum, erector spinae, multifidus and periscapular region and bilateral cervical/trapezius TPI.  Procedure 2: LEFT Piriformis TPI under fluoroscopy CPT: 20553 Primary Purpose: Therapeutic Approach: Percutaneous, ipsilateral approach. Laterality: Midline        Type: Local Anesthesia    Position: Prone   Indications: 1. Chronic pain syndrome   2. Myofascial pain syndrome of lumbar spine   3. Cervicalgia       Pain Score: Pre-procedure: 6 /10 Post-procedure: 6 /10   Pre-op H&P Assessment:  Brianna Arnold is a 33 y.o. (year old), female patient, seen today for interventional treatment. She  has a past surgical history that includes Cervical spine surgery (07/2014); Back surgery (10/2014); and Anterior cervical decomp/discectomy fusion (N/A, 02/11/2019). Brianna Arnold has a current medication list which includes the following prescription(s): albuterol, buprenorphine , vitamin d3,  cyanocobalamin , cyclobenzaprine , ergocalciferol, famotidine , ferrous sulfate , gabapentin , medroxyprogesterone , oxybutynin, propranolol er, tramadol , venlafaxine xr, vitamin b-12, and zolpidem. Her primarily concern today is the Back Pain   Initial Vital Signs:  Pulse/HCG Rate: 88  Temp: 97.7 F (36.5 C) Resp: 16 BP: 133/84 SpO2: 99 %  BMI: Estimated body mass index is 34.86 kg/m as calculated from the following:   Height as of this encounter: 4' 7 (1.397 m).   Weight as of this encounter: 150 lb (68 kg).  Risk Assessment: Allergies: Reviewed. She is allergic to hypafix [wound dressings], ativan  [lorazepam ], and tizanidine.  Allergy Precautions: None required Coagulopathies: Reviewed. None identified.  Blood-thinner therapy: None at this time Active Infection(s): Reviewed. None identified. Brianna Arnold is afebrile  Site Confirmation: Brianna Arnold was asked to confirm the procedure and laterality before marking the site Procedure checklist: Completed Consent: Before the procedure and under the influence of no sedative(s), amnesic(s), or anxiolytics, the patient was informed of the treatment options, risks and possible complications. To fulfill our ethical and legal obligations, as recommended by the American Medical Association's Code of Ethics, I have informed the patient of my clinical impression; the nature and purpose of the treatment or procedure; the risks, benefits, and possible complications of the intervention; the alternatives, including doing nothing; the risk(s) and benefit(s) of the alternative treatment(s) or procedure(s); and the risk(s) and benefit(s) of doing nothing. The patient was provided information about the general risks and possible complications associated with the procedure. These may include, but are not limited to: failure to achieve desired goals, infection, bleeding, organ or nerve damage, allergic reactions, paralysis, and death. In addition, the patient was  informed of those risks and complications associated to the procedure, such as failure to decrease pain;  infection; bleeding; organ or nerve damage with subsequent damage to sensory, motor, and/or autonomic systems, resulting in permanent pain, numbness, and/or weakness of one or several areas of the body; allergic reactions; (i.e.: anaphylactic reaction); and/or death. Furthermore, the patient was informed of those risks and complications associated with the medications. These include, but are not limited to: allergic reactions (i.e.: anaphylactic or anaphylactoid reaction(s)); adrenal axis suppression; blood sugar elevation that in diabetics may result in ketoacidosis or comma; water retention that in patients with history of congestive heart failure may result in shortness of breath, pulmonary edema, and decompensation with resultant heart failure; weight gain; swelling or edema; medication-induced neural toxicity; particulate matter embolism and blood vessel occlusion with resultant organ, and/or nervous system infarction; and/or aseptic necrosis of one or more joints. Finally, the patient was informed that Medicine is not an exact science; therefore, there is also the possibility of unforeseen or unpredictable risks and/or possible complications that may result in a catastrophic outcome. The patient indicated having understood very clearly. We have given the patient no guarantees and we have made no promises. Enough time was given to the patient to ask questions, all of which were answered to the patient's satisfaction. Brianna Arnold has indicated that she wanted to continue with the procedure. Attestation: I, the ordering provider, attest that I have discussed with the patient the benefits, risks, side-effects, alternatives, likelihood of achieving goals, and potential problems during recovery for the procedure that I have provided informed consent. Date  Time: 03/04/2024  1:53 PM  Pre-Procedure  Preparation:  Monitoring: As per clinic protocol. Respiration, ETCO2, SpO2, BP, heart rate and rhythm monitor placed and checked for adequate function Safety Precautions: Patient was assessed for positional comfort and pressure points before starting the procedure. Time-out: I initiated and conducted the Time-out before starting the procedure, as per protocol. The patient was asked to participate by confirming the accuracy of the Time Out information. Verification of the correct person, site, and procedure were performed and confirmed by me, the nursing staff, and the patient. Time-out conducted as per Joint Commission's Universal Protocol (UP.01.01.01). Time: 1445  Description of Procedure:          Area Prepped: Entire Lower Lumbosacral Region & Right cervical region DuraPrep (Iodine Povacrylex [0.7% available iodine] and Isopropyl Alcohol, 74% w/w) Safety Precautions: Aspiration looking for blood return was conducted prior to all injections. At no point did we inject any substances, as a needle was being advanced. No attempts were made at seeking any paresthesias. Safe injection practices and needle disposal techniques used. Medications properly checked for expiration dates. SDV (single dose vial) medications used. Description of the Procedure: Protocol guidelines were followed. The patient was placed in position over the fluoroscopy table. The target area was identified and the area prepped in the usual manner. Skin & deeper tissues infiltrated with local anesthetic. Appropriate amount of time allowed to pass for local anesthetics to take effect. The procedure needles were then advanced to the target area. Proper needle placement secured. Negative aspiration confirmed. Solution injected in intermittent fashion, asking for systemic symptoms every 0.5cc of injectate. The needles were then removed and the area cleansed, making sure to leave some of the prepping solution back to take advantage of its  long term bactericidal properties.  Vitals:   03/04/24 1407  BP: 133/84  Pulse: 88  Resp: 16  Temp: 97.7 F (36.5 C)  TempSrc: Temporal  SpO2: 99%  Weight: 150 lb (68 kg)  Height: 4' 7 (1.397  m)        Start Time: 1445 hrs. End Time:   hrs. Materials:  Needle(s) Type: Regular needle Gauge: 27G Length: 1.5-in  Medication(s): Please see orders for medications and dosing details. 13 trigger points were also injected in the right and left cervical, right and left trapezius, & lumbar paraspinal region with dry needling performed.  Each trigger point was injected with 0.5 to 1 cc of 0.2% ropivacaine .  A LEFT piriformis injection was also performed under fluoroscopy- 1 cm inferior, 1 cm lateral and 1 cm deep to the inferior sacroiliac fissure with contrast highlighting piriformis muscle striations.  5 cc solution made of 4 cc of 0.2% ropivacaine , 1 cc of Decadron  10 mg/cc was injected into the LEFT piriformis muscle under fluoroscopy.  Patient denied any pain radiating down her LEFT leg during injection.   Post-operative Assessment:  Post-procedure Vital Signs:  Pulse/HCG Rate: 88  Temp: 97.7 F (36.5 C) Resp: 16 BP: 133/84 SpO2: 99 %  EBL: None  Complications: No immediate post-treatment complications observed by team, or reported by patient.  Note: The patient tolerated the entire procedure well. A repeat set of vitals were taken after the procedure and the patient was kept under observation following institutional policy, for this type of procedure. Post-procedural neurological assessment was performed, showing return to baseline, prior to discharge. The patient was provided with post-procedure discharge instructions, including a section on how to identify potential problems. Should any problems arise concerning this procedure, the patient was given instructions to immediately contact us , at any time, without hesitation. In any case, we plan to contact the patient by  telephone for a follow-up status report regarding this interventional procedure.  Comments:  No additional relevant information.  Plan of Care   Medications ordered for procedure: Meds ordered this encounter  Medications   ropivacaine  (PF) 2 mg/mL (0.2%) (NAROPIN ) injection 18 mL    No orders of the defined types were placed in this encounter.    Medications administered: We administered ropivacaine  (PF) 2 mg/mL (0.2%).  See the medical record for exact dosing, route, and time of administration.  Follow-up plan:   Return for Keep sch. appt.       Pharmacological management options:  Opioid Analgesics: Butrans  patch, side effects at 7.5 mcg an hour, analgesic benefit with no side effects at 5 mcg an hour, continue  Membrane stabilizer: Continue gabapentin  as prescribed  Muscle relaxant: Continue Flexeril  as prescribed  NSAID: To be determined at a later time  Other analgesic(s): To be determined at a later time    Interventional management options: Brianna Arnold was informed that there is no guarantee that she would be a candidate for interventional therapies. The decision will be based on the results of diagnostic studies, as well as Brianna Arnold's risk profile.  Procedure(s) hx Left axillary peripheral nerve stimulation: 12/30/19 providing significant pain relief.  Removed 02/29/2020              Recent Visits Date Type Provider Dept  02/05/24 Procedure visit Marcelino Nurse, MD [facility]  01/13/24 Office Visit Patel, Seema K, NP [facility]  01/08/24 Procedure visit Marcelino Nurse, MD [facility]  12/11/23 Procedure visit Marcelino Nurse, MD [facility]  Showing recent visits within past 90 days and meeting all other requirements Today's Visits Date Type Provider Dept  03/04/24 Procedure visit Marcelino Nurse, MD [facility]  Showing today's visits and meeting all other requirements Future Appointments Date Type Provider  Dept   04/09/24 Appointment Patel, Seema K, NP [facility]  04/29/24 Appointment Marcelino Nurse, MD [facility]  Showing future appointments within next 90 days and meeting all other requirements  Disposition: Discharge home  Discharge (Date  Time): 03/04/2024; 1447 hrs.   Primary Care Physician: Auston Reyes BIRCH, MD Location: [facility] Note by: Nurse Marcelino, MD Date: 03/04/2024; Time: 2:53 PM  Disclaimer:  Medicine is not an exact science. The only guarantee in medicine is that nothing is guaranteed. It is important to note that the decision to proceed with this intervention was based on the information collected from the patient. The Data and conclusions were drawn from the patient's questionnaire, the interview, and the physical examination. Because the information was provided in large part by the patient, it cannot be guaranteed that it has not been purposely or unconsciously manipulated. Every effort has been made to obtain as much relevant data as possible for this evaluation. It is important to note that the conclusions that lead to this procedure are derived in large part from the available data. Always take into account that the treatment will also be dependent on availability of resources and existing treatment guidelines, considered by other Pain Management Practitioners as being common knowledge and practice, at the time of the intervention. For Medico-Legal purposes, it is also important to point out that variation in procedural techniques and pharmacological choices are the acceptable norm. The indications, contraindications, technique, and results of the above procedure should only be interpreted and judged by a Board-Certified Interventional Pain Specialist with extensive familiarity and expertise in the same exact procedure and technique.

## 2024-03-04 NOTE — Patient Instructions (Signed)

## 2024-03-04 NOTE — Addendum Note (Signed)
 Addended by: DORLENE CHANNEL F on: 03/04/2024 03:12 PM   Modules accepted: Orders

## 2024-03-05 ENCOUNTER — Telehealth: Payer: Self-pay

## 2024-03-05 NOTE — Telephone Encounter (Signed)
 Left VM to regarding post procedure follow up.

## 2024-03-11 ENCOUNTER — Other Ambulatory Visit: Payer: Self-pay | Admitting: Nurse Practitioner

## 2024-03-11 DIAGNOSIS — M542 Cervicalgia: Secondary | ICD-10-CM

## 2024-03-11 DIAGNOSIS — G894 Chronic pain syndrome: Secondary | ICD-10-CM

## 2024-03-11 DIAGNOSIS — M7918 Myalgia, other site: Secondary | ICD-10-CM

## 2024-03-11 DIAGNOSIS — M4105 Infantile idiopathic scoliosis, thoracolumbar region: Secondary | ICD-10-CM

## 2024-03-11 DIAGNOSIS — Z981 Arthrodesis status: Secondary | ICD-10-CM

## 2024-03-12 NOTE — Telephone Encounter (Signed)
 Received request for refill of buprenorphine  patches.  Called to patient to confirm what exactly she is needing.  She has 3 patches left and an appt scheduled for 04/09/24.  We will move her appt up earlier to accommodate medication fills in an effort to not let her run out.  To scheduling for earlier appt.

## 2024-03-25 DIAGNOSIS — R0689 Other abnormalities of breathing: Secondary | ICD-10-CM | POA: Insufficient documentation

## 2024-03-25 DIAGNOSIS — N920 Excessive and frequent menstruation with regular cycle: Secondary | ICD-10-CM | POA: Insufficient documentation

## 2024-03-25 NOTE — Progress Notes (Unsigned)
 PROVIDER NOTE: Interpretation of information contained herein should be left to medically-trained personnel. Specific patient instructions are provided elsewhere under Patient Instructions section of medical record. This document was created in part using AI and STT-dictation technology, any transcriptional errors that may result from this process are unintentional.  Patient: Brianna Arnold  Service: E/M   PCP: Auston Reyes BIRCH, MD  DOB: [birth year]  DOS: 03/26/2024  Provider: Emmy MARLA Blanch, NP  MRN: [id number]  Delivery: Face-to-face  Specialty: Interventional Pain Management  Type: Established Patient  Setting: Ambulatory outpatient facility  Specialty designation: 09  Referring Prov.: Auston Reyes BIRCH, MD  Location: Outpatient office facility       History of present illness (HPI) Brianna Arnold, a 33 y.o. year old female, is here today because of her Medication management [Z79.899]. Brianna Arnold's primary complain today is No chief complaint on file.  Pertinent problems: Brianna Arnold has Medication-induced delirium, acute, mixed level of activity; Overdose; Status post cervical spinal fusion; Nausea and vomiting; Autosomal recessive spondylocostal dysostosis; History of lumbar spinal fusion (T11- iliac); Infantile idiopathic scoliosis of thoracolumbar region; Cervicalgia; Chronic left shoulder pain; Disorder of left rotator cuff; Chronic pain syndrome; Myofascial pain syndrome of lumbar spine; Osteoarthritis of right hip; Chronic hip pain, bilateral; Chronic SI joint pain; Greater trochanteric pain syndrome of right lower extremity; Greater trochanteric pain syndrome of left lower extremity; Cervical cord myelomalacia (HCC); Spondylocostal dysplasia; Medication management; and Piriformis syndrome, left on their pertinent problem list.  Pain Assessment: Severity of   is reported as a  /10. Location:    / . Onset:  . Quality:  . Timing:  . Modifying factor(s):  SABRA Vitals:  vitals  were not taken for this visit.  BMI: Estimated body mass index is 34.86 kg/m as calculated from the following:   Height as of 03/04/24: 4' 7 (1.397 m).   Weight as of 03/04/24: 150 lb (68 kg).  Last encounter: 01/13/2024. Last procedure: Visit date not found.  Reason for encounter:  *** .   Discussed the use of AI scribe software for clinical note transcription with the patient, who gave verbal consent to proceed.  History of Present Illness           Pharmacotherapy Assessment   Analgesic: Tramadol  (Ultram ) 50 mg tablet every 12 hours as needed for pain. MME=20 Monitoring: [place] PMP: PDMP reviewed during this encounter.       Pharmacotherapy: No side-effects or adverse reactions reported. Compliance: No problems identified. Effectiveness: Clinically acceptable.  No notes on file  UDS:  Summary  Date Value Ref Range Status  10/15/2023 FINAL  Final    Comment:    ==================================================================== [employer] (MW) ==================================================================== Test                             Result       Flag       Units  Drug Present and Declared for Prescription Verification   Tramadol                        213          EXPECTED   ng/mg creat   O-Desmethyltramadol            615          EXPECTED   ng/mg creat    Source of tramadol  is a prescription medication. O-desmethyltramadol    is  an expected metabolite of tramadol .  Drug Absent but Declared for Prescription Verification   Buprenorphine                   Not Detected UNEXPECTED ng/mg creat    Transdermal buprenorphine , as indicated in the declared medication    list, is not always detected even when used as directed.  ==================================================================== Test                      Result    Flag   Units      Ref Range   Creatinine              88               mg/dL       >=79 ==================================================================== Declared Medications:  The flagging and interpretation on this report are based on the  following declared medications.  Unexpected results may arise from  inaccuracies in the declared medications.   **Note: The testing scope of this panel includes these medications:   Tramadol  (Ultram )   **Note: The testing scope of this panel does not include small to  moderate amounts of these reported medications:   Buprenorphine  Patch (BuTrans )   **Note: The testing scope of this panel does not include the  following reported medications:   Albuterol (Ventolin HFA)  Cyanocobalamin   Cyclobenzaprine  (Flexeril )  Famotidine  (Pepcid )  Gabapentin   Iron  Medroxyprogesterone  (Depo-Provera )  Oxybutynin (Ditropan)  Propranolol (Inderal)  Venlafaxine (Effexor)  Vitamin B12  Vitamin D2  Vitamin D3  Zolpidem (Ambien) ==================================================================== For clinical consultation, please call [phone]. ====================================================================     No results found for: CBDTHCR No results found for: D8THCCBX No results found for: D9THCCBX  ROS  Constitutional: Denies any fever or chills Gastrointestinal: No reported hemesis, hematochezia, vomiting, or acute GI distress Musculoskeletal: Denies any acute onset joint swelling, redness, loss of ROM, or weakness Neurological: No reported episodes of acute onset apraxia, aphasia, dysarthria, agnosia, amnesia, paralysis, loss of coordination, or loss of consciousness  Medication Review  Vitamin D3, albuterol, buprenorphine , cyanocobalamin , cyclobenzaprine , ergocalciferol, famotidine , ferrous sulfate , gabapentin , medroxyPROGESTERone , oxyBUTYnin, propranolol ER, traMADol , venlafaxine XR, vitamin B-12, and zolpidem  History Review  Allergy: Brianna Arnold is allergic to hypafix [wound dressings], ativan   [lorazepam ], and tizanidine. Drug: Brianna Arnold  reports no history of drug use. Alcohol:  has no history on file for alcohol use. Tobacco:  reports that she has never smoked. She has never used smokeless tobacco. Social: Brianna Arnold  reports that she has never smoked. She has never used smokeless tobacco. She reports that she does not use drugs. Medical:  has a past medical history of Anemia and Jarcho-Levin syndrome. Surgical: Brianna Arnold  has a past surgical history that includes Cervical spine surgery (07/2014); Back surgery (10/2014); and Anterior cervical decomp/discectomy fusion (N/A, 02/11/2019). Family: family history includes Hypertension in her father.  Laboratory Chemistry Profile   Renal Lab Results  Component Value Date   BUN 16 08/07/2023   CREATININE 0.75 08/07/2023   GFRAA >60 02/15/2019   GFRNONAA >60 08/07/2023    Hepatic Lab Results  Component Value Date   AST 23 08/07/2023   ALT 19 08/07/2023   ALBUMIN 3.6 08/07/2023   ALKPHOS 50 08/07/2023   LIPASE 18 02/14/2019    Electrolytes Lab Results  Component Value Date   NA 139 08/07/2023   K 4.6 08/07/2023   CL 111 08/07/2023   CALCIUM 9.1 08/07/2023   MG 2.2  08/07/2023    Bone No results found for: VD25OH, [id number], [id number], [id number], 25OHVITD1, 25OHVITD2, 25OHVITD3, TESTOFREE, TESTOSTERONE  Inflammation (CRP: Acute Phase) (ESR: Chronic Phase) Lab Results  Component Value Date   LATICACIDVEN 1.6 04/19/2015         Note: Above Lab results reviewed.  Recent Imaging Review  DG PAIN CLINIC C-ARM 1-60 MIN NO REPORT Fluoro was used, but no Radiologist interpretation will be provided.  Please refer to NOTES tab for provider progress note. Note: Reviewed        Physical Exam  Vitals: There were no vitals taken for this visit. BMI: Estimated body mass index is 34.86 kg/m as calculated from the following:   Height as of 03/04/24: 4' 7 (1.397 m).   Weight as of 03/04/24: 150 lb (68  kg). Ideal: Patient weight not recorded General appearance: Well nourished, well developed, and well hydrated. In no apparent acute distress Mental status: Alert, oriented x 3 (person, place, & time)       Respiratory: No evidence of acute respiratory distress Eyes: [name]   Assessment   Diagnosis Status  1. Medication management   2. Chronic pain syndrome   3. Myofascial pain syndrome of lumbar spine   4. Cervicalgia   5. Infantile idiopathic scoliosis of thoracolumbar region   6. History of lumbar spinal fusion (T11- iliac)    Controlled Controlled Controlled   Updated Problems: No problems updated.  Plan of Care  Problem-specific:  Assessment and Plan            Brianna Arnold has a current medication list which includes the following long-term medication(s): albuterol, famotidine , ferrous sulfate , gabapentin , medroxyprogesterone , propranolol er, and zolpidem.  Pharmacotherapy (Medications Ordered): No orders of the defined types were placed in this encounter.  Orders:  No orders of the defined types were placed in this encounter.     No follow-ups on file.    Recent Visits Date Type Provider Dept  03/04/24 Procedure visit Marcelino Nurse, MD [facility]  02/05/24 Procedure visit Marcelino Nurse, MD [facility]  01/13/24 Office Visit Anubis Fundora K, NP [facility]  01/08/24 Procedure visit Marcelino Nurse, MD [facility]  Showing recent visits within past 90 days and meeting all other requirements Future Appointments Date Type Provider Dept  03/26/24 Appointment Benoit Meech K, NP [facility]  04/29/24 Appointment Marcelino Nurse, MD [facility]  Showing future appointments within next 90 days and meeting all other requirements  I discussed the assessment and treatment plan with the patient. The patient was provided an opportunity to ask questions and all were answered. The patient agreed  with the plan and demonstrated an understanding of the instructions.  Patient advised to call back or seek an in-person evaluation if the symptoms or condition worsens.  Duration of encounter: *** minutes.  Total time on encounter, as per AMA guidelines included both the face-to-face and non-face-to-face time personally spent by the physician and/or other qualified health care professional(s) on the day of the encounter (includes time in activities that require the physician or other qualified health care professional and does not include time in activities normally performed by clinical staff). Physician's time may include the following activities when performed: Preparing to see the patient (e.g., pre-charting review of records, searching for previously ordered imaging, lab work, and nerve conduction tests) Review of prior analgesic pharmacotherapies. Reviewing PMP Interpreting ordered tests (e.g., lab work, imaging, nerve conduction tests) Performing post-procedure evaluations, including interpretation  of diagnostic procedures Obtaining and/or reviewing separately obtained history Performing a medically appropriate examination and/or evaluation Counseling and educating the patient/family/caregiver Ordering medications, tests, or procedures Referring and communicating with other health care professionals (when not separately reported) Documenting clinical information in the electronic or other health record Independently interpreting results (not separately reported) and communicating results to the patient/ family/caregiver Care coordination (not separately reported)  Note by: Chevelle Durr K Jonay Hitchcock, NP (TTS and AI technology used. I apologize for any typographical errors that were not detected and corrected.) Date: 03/26/2024; Time: 3:50 PM

## 2024-03-26 ENCOUNTER — Ambulatory Visit: Attending: Nurse Practitioner | Admitting: Nurse Practitioner

## 2024-03-26 ENCOUNTER — Encounter: Payer: Self-pay | Admitting: Nurse Practitioner

## 2024-03-26 VITALS — BP 133/96 | HR 87 | Temp 97.7°F | Resp 18 | Ht <= 58 in | Wt 150.0 lb

## 2024-03-26 DIAGNOSIS — M542 Cervicalgia: Secondary | ICD-10-CM | POA: Insufficient documentation

## 2024-03-26 DIAGNOSIS — Z981 Arthrodesis status: Secondary | ICD-10-CM | POA: Insufficient documentation

## 2024-03-26 DIAGNOSIS — Z79899 Other long term (current) drug therapy: Secondary | ICD-10-CM | POA: Insufficient documentation

## 2024-03-26 DIAGNOSIS — G894 Chronic pain syndrome: Secondary | ICD-10-CM | POA: Diagnosis not present

## 2024-03-26 DIAGNOSIS — M4105 Infantile idiopathic scoliosis, thoracolumbar region: Secondary | ICD-10-CM | POA: Insufficient documentation

## 2024-03-26 DIAGNOSIS — M7918 Myalgia, other site: Secondary | ICD-10-CM | POA: Insufficient documentation

## 2024-03-26 MED ORDER — BUPRENORPHINE 5 MCG/HR TD PTWK: 1.0000 | MEDICATED_PATCH | TRANSDERMAL | 3 refills | Status: AC

## 2024-03-26 MED ORDER — TRAMADOL HCL 50 MG PO TABS: 50.0000 mg | ORAL_TABLET | Freq: Two times a day (BID) | ORAL | 3 refills | Status: AC | PRN

## 2024-03-26 NOTE — Progress Notes (Signed)
 Nursing Pain Medication Assessment:  Safety precautions to be maintained throughout the outpatient stay will include: orient to surroundings, keep bed in low position, maintain call bell within reach at all times, provide assistance with transfer out of bed and ambulation.  Medication Inspection Compliance: Pill count conducted under aseptic conditions, in front of the patient. Neither the pills nor the bottle was removed from the patient's sight at any time. Once count was completed pills were immediately returned to the patient in their original bottle.  Medication: Tramadol  (Ultram ) Pill/Patch Count: 50 of 60 pills/patches remain Pill/Patch Appearance: Markings consistent with prescribed medication Bottle Appearance: Standard pharmacy container. Clearly labeled. Filled Date: 09 / 22 / 2025 Last Medication intake:  Yesterday  Patient accidentally threw box away of [place] . Put her last patch 03/25/24

## 2024-04-09 ENCOUNTER — Encounter: Admitting: Nurse Practitioner

## 2024-04-29 ENCOUNTER — Ambulatory Visit
Attending: Student in an Organized Health Care Education/Training Program | Admitting: Student in an Organized Health Care Education/Training Program

## 2024-04-29 ENCOUNTER — Encounter: Payer: Self-pay | Admitting: Student in an Organized Health Care Education/Training Program

## 2024-04-29 VITALS — BP 146/90 | HR 78 | Temp 97.0°F | Resp 16 | Ht <= 58 in | Wt 155.0 lb

## 2024-04-29 DIAGNOSIS — G894 Chronic pain syndrome: Secondary | ICD-10-CM | POA: Insufficient documentation

## 2024-04-29 DIAGNOSIS — M7918 Myalgia, other site: Secondary | ICD-10-CM | POA: Diagnosis not present

## 2024-04-29 DIAGNOSIS — M4105 Infantile idiopathic scoliosis, thoracolumbar region: Secondary | ICD-10-CM | POA: Insufficient documentation

## 2024-04-29 MED ORDER — ROPIVACAINE HCL 2 MG/ML IJ SOLN
18.0000 mL | Freq: Once | INTRAMUSCULAR | Status: AC
  Administered 2024-04-29: 18 mL via PERINEURAL

## 2024-04-29 MED ORDER — ROPIVACAINE HCL 2 MG/ML IJ SOLN
INTRAMUSCULAR | Status: AC
  Filled 2024-04-29: qty 20

## 2024-04-29 NOTE — Progress Notes (Signed)
 Safety precautions to be maintained throughout the outpatient stay will include: orient to surroundings, keep bed in low position, maintain call bell within reach at all times, provide assistance with transfer out of bed and ambulation.

## 2024-04-29 NOTE — Patient Instructions (Signed)

## 2024-04-29 NOTE — Progress Notes (Signed)
 PROVIDER NOTE: Information contained herein reflects review and annotations entered in association with encounter. Interpretation of such information and data should be left to medically-trained personnel. Information provided to patient can be located elsewhere in the medical record under Patient Instructions. Document created using STT-dictation technology, any transcriptional errors that may result from process are unintentional.    Patient: Brianna Arnold  Service Category: Procedure  Provider: Wallie Sherry, MD  DOB: 1991/01/10  DOS: 04/29/2024  Location: [facility]  MRN: [id number]  Setting: Ambulatory - outpatient  Referring Provider: Auston Reyes BIRCH, MD  Type: Established Patient  Specialty: Interventional Pain Management  PCP: Auston Reyes BIRCH, MD   Primary Reason for Visit: Interventional Pain Management Treatment. CC: back pain  Procedure:          Anesthesia, Analgesia, Anxiolysis:  Procedure 1: Lumbar Paraspinal Muscle Trigger Point Injection (3+)         Quadratus lumborum, erector spinae, multifidus and periscapular region and bilateral cervical/trapezius TPI.  CPT: 20553 Primary Purpose: Therapeutic Approach: Percutaneous, ipsilateral approach. Laterality: Midline        Type: Local Anesthesia    Position: Prone   Indications: 1. Chronic pain syndrome   2. Myofascial pain syndrome of lumbar spine   3. Infantile idiopathic scoliosis of thoracolumbar region       Pain Score: Pre-procedure: 7 /10 Post-procedure: 7 /10   Pre-op H&P Assessment:  Brianna Arnold is a 34 y.o. (year old), female patient, seen today for interventional treatment. She  has a past surgical history that includes Cervical spine surgery (07/2014); Back surgery (10/2014); and Anterior cervical decomp/discectomy fusion (N/A, 02/11/2019). Brianna Arnold has a current medication list which includes the following prescription(s): albuterol, amitriptyline, buprenorphine , vitamin d3,  cyanocobalamin , cyclobenzaprine , ergocalciferol, famotidine , ferrous sulfate , gabapentin , medroxyprogesterone , oxybutynin, propranolol er, tramadol , venlafaxine xr, vitamin b-12, and zolpidem, and the following Facility-Administered Medications: ropivacaine  (pf) 2 mg/ml (0.2%). Her primarily concern today is the Back Pain (Upper and mid ) and Neck Pain   Initial Vital Signs:  Pulse/HCG Rate: 78  Temp: (!) 97 F (36.1 C) Resp: 16 BP: (!) 146/90 SpO2: 99 %  BMI: Estimated body mass index is 35.38 kg/m as calculated from the following:   Height as of this encounter: 4' 7.5 (1.41 m).   Weight as of this encounter: 155 lb (70.3 kg).  Risk Assessment: Allergies: Reviewed. She is allergic to hypafix [wound dressings], ativan  [lorazepam ], and tizanidine.  Allergy Precautions: None required Coagulopathies: Reviewed. None identified.  Blood-thinner therapy: None at this time Active Infection(s): Reviewed. None identified. Brianna Arnold is afebrile  Site Confirmation: Brianna Arnold was asked to confirm the procedure and laterality before marking the site Procedure checklist: Completed Consent: Before the procedure and under the influence of no sedative(s), amnesic(s), or anxiolytics, the patient was informed of the treatment options, risks and possible complications. To fulfill our ethical and legal obligations, as recommended by the American Medical Association's Code of Ethics, I have informed the patient of my clinical impression; the nature and purpose of the treatment or procedure; the risks, benefits, and possible complications of the intervention; the alternatives, including doing nothing; the risk(s) and benefit(s) of the alternative treatment(s) or procedure(s); and the risk(s) and benefit(s) of doing nothing. The patient was provided information about the general risks and possible complications associated with the procedure. These may include, but are not limited to: failure to achieve desired  goals, infection, bleeding, organ or nerve damage, allergic reactions, paralysis, and death. In addition, the  patient was informed of those risks and complications associated to the procedure, such as failure to decrease pain; infection; bleeding; organ or nerve damage with subsequent damage to sensory, motor, and/or autonomic systems, resulting in permanent pain, numbness, and/or weakness of one or several areas of the body; allergic reactions; (i.e.: anaphylactic reaction); and/or death. Furthermore, the patient was informed of those risks and complications associated with the medications. These include, but are not limited to: allergic reactions (i.e.: anaphylactic or anaphylactoid reaction(s)); adrenal axis suppression; blood sugar elevation that in diabetics may result in ketoacidosis or comma; water retention that in patients with history of congestive heart failure may result in shortness of breath, pulmonary edema, and decompensation with resultant heart failure; weight gain; swelling or edema; medication-induced neural toxicity; particulate matter embolism and blood vessel occlusion with resultant organ, and/or nervous system infarction; and/or aseptic necrosis of one or more joints. Finally, the patient was informed that Medicine is not an exact science; therefore, there is also the possibility of unforeseen or unpredictable risks and/or possible complications that may result in a catastrophic outcome. The patient indicated having understood very clearly. We have given the patient no guarantees and we have made no promises. Enough time was given to the patient to ask questions, all of which were answered to the patient's satisfaction. Brianna Arnold has indicated that she wanted to continue with the procedure. Attestation: I, the ordering provider, attest that I have discussed with the patient the benefits, risks, side-effects, alternatives, likelihood of achieving goals, and potential problems during  recovery for the procedure that I have provided informed consent. Date  Time: 04/29/2024 11:08 AM  Pre-Procedure Preparation:  Monitoring: As per clinic protocol. Respiration, ETCO2, SpO2, BP, heart rate and rhythm monitor placed and checked for adequate function Safety Precautions: Patient was assessed for positional comfort and pressure points before starting the procedure. Time-out: I initiated and conducted the Time-out before starting the procedure, as per protocol. The patient was asked to participate by confirming the accuracy of the Time Out information. Verification of the correct person, site, and procedure were performed and confirmed by me, the nursing staff, and the patient. Time-out conducted as per Joint Commission's Universal Protocol (UP.01.01.01). Time: 1129  Description of Procedure:          Area Prepped: Entire Lower Lumbosacral Region & Right cervical region DuraPrep (Iodine Povacrylex [0.7% available iodine] and Isopropyl Alcohol, 74% w/w) Safety Precautions: Aspiration looking for blood return was conducted prior to all injections. At no point did we inject any substances, as a needle was being advanced. No attempts were made at seeking any paresthesias. Safe injection practices and needle disposal techniques used. Medications properly checked for expiration dates. SDV (single dose vial) medications used. Description of the Procedure: Protocol guidelines were followed. The patient was placed in position over the fluoroscopy table. The target area was identified and the area prepped in the usual manner. Skin & deeper tissues infiltrated with local anesthetic. Appropriate amount of time allowed to pass for local anesthetics to take effect. The procedure needles were then advanced to the target area. Proper needle placement secured. Negative aspiration confirmed. Solution injected in intermittent fashion, asking for systemic symptoms every 0.5cc of injectate. The needles were  then removed and the area cleansed, making sure to leave some of the prepping solution back to take advantage of its long term bactericidal properties.  Vitals:   04/29/24 1111  BP: (!) 146/90  Pulse: 78  Resp: 16  Temp: (!) 97 F (  36.1 C)  TempSrc: Temporal  SpO2: 99%  Weight: 155 lb (70.3 kg)  Height: 4' 7.5 (1.41 m)        Start Time: 1129 hrs. End Time: 1117 hrs. Materials:  Needle(s) Type: Regular needle Gauge: 27G Length: 1.5-in  Medication(s): Please see orders for medications and dosing details. 14 trigger points were also injected in the right and left cervical, right and left trapezius, & lumbar paraspinal region with dry needling performed.  Each trigger point was injected with 0.5 to 1 cc of 0.2% ropivacaine .    Post-operative Assessment:  Post-procedure Vital Signs:  Pulse/HCG Rate: 78  Temp: (!) 97 F (36.1 C) Resp: 16 BP: (!) 146/90 SpO2: 99 %  EBL: None  Complications: No immediate post-treatment complications observed by team, or reported by patient.  Note: The patient tolerated the entire procedure well. A repeat set of vitals were taken after the procedure and the patient was kept under observation following institutional policy, for this type of procedure. Post-procedural neurological assessment was performed, showing return to baseline, prior to discharge. The patient was provided with post-procedure discharge instructions, including a section on how to identify potential problems. Should any problems arise concerning this procedure, the patient was given instructions to immediately contact us , at any time, without hesitation. In any case, we plan to contact the patient by telephone for a follow-up status report regarding this interventional procedure.  Comments:  No additional relevant information.  Plan of Care   Medications ordered for procedure: Meds ordered this encounter  Medications   ropivacaine  (PF) 2 mg/mL (0.2%) (NAROPIN ) injection  18 mL    No orders of the defined types were placed in this encounter.    Medications administered: Antonea M. Beecher Browning had no medications administered during this visit.  See the medical record for exact dosing, route, and time of administration.  Follow-up plan:   Return in about 1 month (around 05/30/2024) for TPI 1 month .       Pharmacological management options:  Opioid Analgesics: Butrans  patch, side effects at 7.5 mcg an hour, analgesic benefit with no side effects at 5 mcg an hour, continue  Membrane stabilizer: Continue gabapentin  as prescribed  Muscle relaxant: Continue Flexeril  as prescribed  NSAID: To be determined at a later time  Other analgesic(s): To be determined at a later time    Interventional management options: Ms. Bernardi was informed that there is no guarantee that she would be a candidate for interventional therapies. The decision will be based on the results of diagnostic studies, as well as Ms. Spainhower's risk profile.  Procedure(s) hx Left axillary peripheral nerve stimulation: 12/30/19 providing significant pain relief.  Removed 02/29/2020              Recent Visits Date Type Provider Dept  03/26/24 Office Visit Patel, Seema K, NP [facility]  03/04/24 Procedure visit Marcelino Nurse, MD [facility]  02/05/24 Procedure visit Marcelino Nurse, MD [facility]  Showing recent visits within past 90 days and meeting all other requirements Today's Visits Date Type Provider Dept  04/29/24 Procedure visit Marcelino Nurse, MD [facility]  Showing today's visits and meeting all other requirements Future Appointments Date Type Provider Dept  05/27/24 Appointment Marcelino Nurse, MD [facility]  06/17/24 Appointment Patel, Seema K, NP [facility]  Showing future appointments within next 90 days and meeting all other requirements  Disposition: Discharge home  Discharge (Date  Time): 04/29/2024; 1136  hrs.  Primary Care Physician: Auston Reyes BIRCH, MD Location: [facility] Note by: Wallie Sherry, MD Date: 04/29/2024; Time: 11:41 AM  Disclaimer:  Medicine is not an exact science. The only guarantee in medicine is that nothing is guaranteed. It is important to note that the decision to proceed with this intervention was based on the information collected from the patient. The Data and conclusions were drawn from the patient's questionnaire, the interview, and the physical examination. Because the information was provided in large part by the patient, it cannot be guaranteed that it has not been purposely or unconsciously manipulated. Every effort has been made to obtain as much relevant data as possible for this evaluation. It is important to note that the conclusions that lead to this procedure are derived in large part from the available data. Always take into account that the treatment will also be dependent on availability of resources and existing treatment guidelines, considered by other Pain Management Practitioners as being common knowledge and practice, at the time of the intervention. For Medico-Legal purposes, it is also important to point out that variation in procedural techniques and pharmacological choices are the acceptable norm. The indications, contraindications, technique, and results of the above procedure should only be interpreted and judged by a Board-Certified Interventional Pain Specialist with extensive familiarity and expertise in the same exact procedure and technique.

## 2024-04-30 ENCOUNTER — Telehealth: Payer: Self-pay

## 2024-04-30 NOTE — Telephone Encounter (Signed)
 Post procedure follow up.  LM

## 2024-05-27 ENCOUNTER — Encounter: Payer: Self-pay | Admitting: Student in an Organized Health Care Education/Training Program

## 2024-05-27 ENCOUNTER — Ambulatory Visit: Admitting: Student in an Organized Health Care Education/Training Program

## 2024-05-27 VITALS — BP 141/124 | Temp 98.1°F | Resp 15 | Ht <= 58 in | Wt 155.0 lb

## 2024-05-27 DIAGNOSIS — M4105 Infantile idiopathic scoliosis, thoracolumbar region: Secondary | ICD-10-CM | POA: Diagnosis not present

## 2024-05-27 DIAGNOSIS — G894 Chronic pain syndrome: Secondary | ICD-10-CM

## 2024-05-27 DIAGNOSIS — M7918 Myalgia, other site: Secondary | ICD-10-CM | POA: Diagnosis not present

## 2024-05-27 MED ORDER — DIAZEPAM 5 MG PO TABS
5.0000 mg | ORAL_TABLET | ORAL | Status: AC
  Administered 2024-05-27: 5 mg via ORAL

## 2024-05-27 MED ORDER — ROPIVACAINE HCL 2 MG/ML IJ SOLN
18.0000 mL | Freq: Once | INTRAMUSCULAR | Status: AC
  Administered 2024-05-27: 20 mL via PERINEURAL
  Filled 2024-05-27: qty 20

## 2024-05-27 MED ORDER — DIAZEPAM 5 MG PO TABS
ORAL_TABLET | ORAL | Status: AC
  Filled 2024-05-27: qty 1

## 2024-05-27 NOTE — Progress Notes (Signed)
 Safety precautions to be maintained throughout the outpatient stay will include: orient to surroundings, keep bed in low position, maintain call bell within reach at all times, provide assistance with transfer out of bed and ambulation.

## 2024-05-27 NOTE — Progress Notes (Signed)
 PROVIDER NOTE: Information contained herein reflects review and annotations entered in association with encounter. Interpretation of such information and data should be left to medically-trained personnel. Information provided to patient can be located elsewhere in the medical record under Patient Instructions. Document created using STT-dictation technology, any transcriptional errors that may result from process are unintentional.    Patient: Brianna Arnold  Service Category: Procedure  Provider: Wallie Sherry, MD  DOB: Jan 06, 1991  DOS: 05/27/2024  Location: [facility]  MRN: [id number]  Setting: Ambulatory - outpatient  Referring Provider: Auston Reyes BIRCH, MD  Type: Established Patient  Specialty: Interventional Pain Management  PCP: Auston Reyes BIRCH, MD   Primary Reason for Visit: Interventional Pain Management Treatment. CC: back pain  Procedure:          Anesthesia, Analgesia, Anxiolysis:  Procedure 1: Lumbar Paraspinal Muscle Trigger Point Injection (3+)         Quadratus lumborum, erector spinae, multifidus and periscapular region and bilateral cervical/trapezius TPI.  CPT: 20553 Primary Purpose: Therapeutic Approach: Percutaneous, ipsilateral approach. Laterality: Midline        Type: Local Anesthesia with 5 mg PO Valium  (anxiolysis)   Position: Prone   Indications: 1. Chronic pain syndrome   2. Myofascial pain syndrome of lumbar spine   3. Infantile idiopathic scoliosis of thoracolumbar region       Pain Score: Pre-procedure: 8 /10 Post-procedure: 8 /10   Pre-op H&P Assessment:  Ms. Hepp is a 34 y.o. (year old), female patient, seen today for interventional treatment. She  has a past surgical history that includes Cervical spine surgery (07/2014); Back surgery (10/2014); and Anterior cervical decomp/discectomy fusion (N/A, 02/11/2019). Ms. Andreason has a current medication list which includes the following prescription(s): albuterol,  buprenorphine , vitamin d3, cyanocobalamin , cyclobenzaprine , ergocalciferol, famotidine , ferrous sulfate , gabapentin , medroxyprogesterone , propranolol er, tramadol , venlafaxine xr, vitamin b-12, zolpidem, amitriptyline, and oxybutynin. Her primarily concern today is the Neck Pain   Initial Vital Signs:  Pulse/HCG Rate:  ECG Heart Rate: 94 Temp: 98.1 F (36.7 C) Resp: 18 BP: (!) 136/93 SpO2: (!) 88 %  BMI: Estimated body mass index is 34.75 kg/m as calculated from the following:   Height as of this encounter: 4' 8 (1.422 m).   Weight as of this encounter: 155 lb (70.3 kg).  Risk Assessment: Allergies: Reviewed. She is allergic to hypafix [wound dressings], ativan  [lorazepam ], and tizanidine.  Allergy Precautions: None required Coagulopathies: Reviewed. None identified.  Blood-thinner therapy: None at this time Active Infection(s): Reviewed. None identified. Ms. Luckow is afebrile  Site Confirmation: Ms. Mcgruder was asked to confirm the procedure and laterality before marking the site Procedure checklist: Completed Consent: Before the procedure and under the influence of no sedative(s), amnesic(s), or anxiolytics, the patient was informed of the treatment options, risks and possible complications. To fulfill our ethical and legal obligations, as recommended by the American Medical Association's Code of Ethics, I have informed the patient of my clinical impression; the nature and purpose of the treatment or procedure; the risks, benefits, and possible complications of the intervention; the alternatives, including doing nothing; the risk(s) and benefit(s) of the alternative treatment(s) or procedure(s); and the risk(s) and benefit(s) of doing nothing. The patient was provided information about the general risks and possible complications associated with the procedure. These may include, but are not limited to: failure to achieve desired goals, infection, bleeding, organ or nerve damage, allergic  reactions, paralysis, and death. In addition, the patient was informed of those risks and complications associated  to the procedure, such as failure to decrease pain; infection; bleeding; organ or nerve damage with subsequent damage to sensory, motor, and/or autonomic systems, resulting in permanent pain, numbness, and/or weakness of one or several areas of the body; allergic reactions; (i.e.: anaphylactic reaction); and/or death. Furthermore, the patient was informed of those risks and complications associated with the medications. These include, but are not limited to: allergic reactions (i.e.: anaphylactic or anaphylactoid reaction(s)); adrenal axis suppression; blood sugar elevation that in diabetics may result in ketoacidosis or comma; water retention that in patients with history of congestive heart failure may result in shortness of breath, pulmonary edema, and decompensation with resultant heart failure; weight gain; swelling or edema; medication-induced neural toxicity; particulate matter embolism and blood vessel occlusion with resultant organ, and/or nervous system infarction; and/or aseptic necrosis of one or more joints. Finally, the patient was informed that Medicine is not an exact science; therefore, there is also the possibility of unforeseen or unpredictable risks and/or possible complications that may result in a catastrophic outcome. The patient indicated having understood very clearly. We have given the patient no guarantees and we have made no promises. Enough time was given to the patient to ask questions, all of which were answered to the patient's satisfaction. Ms. Bobst has indicated that she wanted to continue with the procedure. Attestation: I, the ordering provider, attest that I have discussed with the patient the benefits, risks, side-effects, alternatives, likelihood of achieving goals, and potential problems during recovery for the procedure that I have provided informed  consent. Date  Time: 05/27/2024  1:58 PM  Pre-Procedure Preparation:  Monitoring: As per clinic protocol. Respiration, ETCO2, SpO2, BP, heart rate and rhythm monitor placed and checked for adequate function Safety Precautions: Patient was assessed for positional comfort and pressure points before starting the procedure. Time-out: I initiated and conducted the Time-out before starting the procedure, as per protocol. The patient was asked to participate by confirming the accuracy of the Time Out information. Verification of the correct person, site, and procedure were performed and confirmed by me, the nursing staff, and the patient. Time-out conducted as per Joint Commission's Universal Protocol (UP.01.01.01). Time: 1430  Description of Procedure:          Area Prepped: Entire Lower Lumbosacral Region & Right cervical region DuraPrep (Iodine Povacrylex [0.7% available iodine] and Isopropyl Alcohol, 74% w/w) Safety Precautions: Aspiration looking for blood return was conducted prior to all injections. At no point did we inject any substances, as a needle was being advanced. No attempts were made at seeking any paresthesias. Safe injection practices and needle disposal techniques used. Medications properly checked for expiration dates. SDV (single dose vial) medications used. Description of the Procedure: Protocol guidelines were followed. The patient was placed in position over the fluoroscopy table. The target area was identified and the area prepped in the usual manner. Skin & deeper tissues infiltrated with local anesthetic. Appropriate amount of time allowed to pass for local anesthetics to take effect. The procedure needles were then advanced to the target area. Proper needle placement secured. Negative aspiration confirmed. Solution injected in intermittent fashion, asking for systemic symptoms every 0.5cc of injectate. The needles were then removed and the area cleansed, making sure to leave  some of the prepping solution back to take advantage of its long term bactericidal properties.  Vitals:   05/27/24 1404 05/27/24 1430 05/27/24 1434  BP: (!) 136/93 (!) 157/121 (!) 141/124  Resp:  18 15  Temp: 98.1 F (36.7 C)  SpO2: (!) 88% 99% 98%  Weight: 155 lb (70.3 kg)    Height: 4' 8 (1.422 m)          Start Time: 1430 hrs. End Time:   hrs. Materials:  Needle(s) Type: Regular needle Gauge: 27G Length: 1.5-in  Medication(s): Please see orders for medications and dosing details. 14 trigger points were also injected in the right and left cervical, right and left trapezius, & lumbar paraspinal region with dry needling performed.  Each trigger point was injected with 0.5 to 1 cc of 0.2% ropivacaine .    Post-operative Assessment:  Post-procedure Vital Signs:  Pulse/HCG Rate:  100 Temp: 98.1 F (36.7 C) Resp: 15 BP: (!) 141/124 SpO2: 98 %  EBL: None  Complications: No immediate post-treatment complications observed by team, or reported by patient.  Note: The patient tolerated the entire procedure well. A repeat set of vitals were taken after the procedure and the patient was kept under observation following institutional policy, for this type of procedure. Post-procedural neurological assessment was performed, showing return to baseline, prior to discharge. The patient was provided with post-procedure discharge instructions, including a section on how to identify potential problems. Should any problems arise concerning this procedure, the patient was given instructions to immediately contact us , at any time, without hesitation. In any case, we plan to contact the patient by telephone for a follow-up status report regarding this interventional procedure.  Comments:  No additional relevant information.  Plan of Care   Medications ordered for procedure: Meds ordered this encounter  Medications   ropivacaine  (PF) 2 mg/mL (0.2%) (NAROPIN ) injection 18 mL   diazepam   (VALIUM ) tablet 5 mg    Make sure Flumazenil is available in the pyxis when using this medication. If oversedation occurs, administer 0.2 mg IV over 15 sec. If after 45 sec no response, administer 0.2 mg again over 1 min; may repeat at 1 min intervals; not to exceed 4 doses (1 mg)    No orders of the defined types were placed in this encounter.    Medications administered: We administered ropivacaine  (PF) 2 mg/mL (0.2%) and diazepam .  See the medical record for exact dosing, route, and time of administration.  Follow-up plan:   No follow-ups on file.       Pharmacological management options:  Opioid Analgesics: Butrans  patch, side effects at 7.5 mcg an hour, analgesic benefit with no side effects at 5 mcg an hour, continue  Membrane stabilizer: Continue gabapentin  as prescribed  Muscle relaxant: Continue Flexeril  as prescribed  NSAID: To be determined at a later time  Other analgesic(s): To be determined at a later time    Interventional management options: Ms. Bucy was informed that there is no guarantee that she would be a candidate for interventional therapies. The decision will be based on the results of diagnostic studies, as well as Ms. Malesky's risk profile.  Procedure(s) hx Left axillary peripheral nerve stimulation: 12/30/19 providing significant pain relief.  Removed 02/29/2020              Recent Visits Date Type Provider Dept  04/29/24 Procedure visit Marcelino Nurse, MD [facility]  03/26/24 Office Visit Patel, Seema K, NP [facility]  03/04/24 Procedure visit Marcelino Nurse, MD [facility]  Showing recent visits within past 90 days and meeting all other requirements Today's Visits Date Type Provider Dept  05/27/24 Procedure visit Marcelino Nurse, MD [facility]  Showing today's visits and meeting all other requirements Future Appointments Date  Type Provider Dept  06/17/24 Appointment Patel, Seema K, NP [facility]   06/24/24 Appointment Marcelino Nurse, MD [facility]  Showing future appointments within next 90 days and meeting all other requirements  Disposition: Discharge home  Discharge (Date  Time): 05/27/2024; 1445 hrs.   Primary Care Physician: Auston Reyes BIRCH, MD Location: [facility] Note by: Nurse Marcelino, MD Date: 05/27/2024; Time: 2:43 PM  Disclaimer:  Medicine is not an exact science. The only guarantee in medicine is that nothing is guaranteed. It is important to note that the decision to proceed with this intervention was based on the information collected from the patient. The Data and conclusions were drawn from the patient's questionnaire, the interview, and the physical examination. Because the information was provided in large part by the patient, it cannot be guaranteed that it has not been purposely or unconsciously manipulated. Every effort has been made to obtain as much relevant data as possible for this evaluation. It is important to note that the conclusions that lead to this procedure are derived in large part from the available data. Always take into account that the treatment will also be dependent on availability of resources and existing treatment guidelines, considered by other Pain Management Practitioners as being common knowledge and practice, at the time of the intervention. For Medico-Legal purposes, it is also important to point out that variation in procedural techniques and pharmacological choices are the acceptable norm. The indications, contraindications, technique, and results of the above procedure should only be interpreted and judged by a Board-Certified Interventional Pain Specialist with extensive familiarity and expertise in the same exact procedure and technique.

## 2024-05-27 NOTE — Patient Instructions (Signed)

## 2024-06-17 ENCOUNTER — Encounter: Admitting: Nurse Practitioner

## 2024-06-24 ENCOUNTER — Ambulatory Visit: Admitting: Student in an Organized Health Care Education/Training Program
